# Patient Record
Sex: Female | Born: 1970
Health system: Southern US, Community
[De-identification: ages and names within clinical notes are randomized; demographics above are authoritative.]

## PROBLEM LIST (undated history)

## (undated) DIAGNOSIS — F1021 Alcohol dependence, in remission: Secondary | ICD-10-CM

## (undated) DIAGNOSIS — F411 Generalized anxiety disorder: Secondary | ICD-10-CM

## (undated) DIAGNOSIS — N200 Calculus of kidney: Secondary | ICD-10-CM

## (undated) DIAGNOSIS — I10 Essential (primary) hypertension: Secondary | ICD-10-CM

## (undated) DIAGNOSIS — G4733 Obstructive sleep apnea (adult) (pediatric): Secondary | ICD-10-CM

## (undated) DIAGNOSIS — K5090 Crohn's disease, unspecified, without complications: Secondary | ICD-10-CM

## (undated) DIAGNOSIS — F1020 Alcohol dependence, uncomplicated: Secondary | ICD-10-CM

## (undated) DIAGNOSIS — F1191 Opioid use, unspecified, in remission: Secondary | ICD-10-CM

## (undated) DIAGNOSIS — F329 Major depressive disorder, single episode, unspecified: Secondary | ICD-10-CM

## (undated) DIAGNOSIS — F4323 Adjustment disorder with mixed anxiety and depressed mood: Secondary | ICD-10-CM

## (undated) HISTORY — DX: Crohn's disease, unspecified, without complications: K50.90

## (undated) HISTORY — PX: LITHOTRIPSY: SUR834

## (undated) HISTORY — DX: Alcohol dependence, in remission: F10.21

## (undated) HISTORY — DX: Generalized anxiety disorder: F41.1

## (undated) HISTORY — PX: LEEP: SHX91

## (undated) HISTORY — DX: Adjustment disorder with mixed anxiety and depressed mood: F43.23

## (undated) HISTORY — DX: Opioid use, unspecified, in remission: F11.91

## (undated) HISTORY — DX: Obstructive sleep apnea (adult) (pediatric): G47.33

## (undated) HISTORY — PX: TUBAL LIGATION: SHX77

## (undated) HISTORY — DX: Major depressive disorder, single episode, unspecified: F32.9

---

## 1998-02-26 ENCOUNTER — Emergency Department (HOSPITAL_COMMUNITY): Admission: EM | Admit: 1998-02-26 | Discharge: 1998-02-26 | Payer: Self-pay | Admitting: Emergency Medicine

## 1998-07-19 ENCOUNTER — Emergency Department (HOSPITAL_COMMUNITY): Admission: EM | Admit: 1998-07-19 | Discharge: 1998-07-19 | Payer: Self-pay | Admitting: Emergency Medicine

## 1999-06-11 ENCOUNTER — Emergency Department (HOSPITAL_COMMUNITY): Admission: EM | Admit: 1999-06-11 | Discharge: 1999-06-11 | Payer: Self-pay

## 1999-10-27 ENCOUNTER — Emergency Department (HOSPITAL_COMMUNITY): Admission: EM | Admit: 1999-10-27 | Discharge: 1999-10-28 | Payer: Self-pay | Admitting: Emergency Medicine

## 1999-10-28 ENCOUNTER — Encounter: Payer: Self-pay | Admitting: Emergency Medicine

## 2000-02-27 ENCOUNTER — Encounter: Payer: Self-pay | Admitting: Emergency Medicine

## 2000-02-27 ENCOUNTER — Emergency Department (HOSPITAL_COMMUNITY): Admission: EM | Admit: 2000-02-27 | Discharge: 2000-02-27 | Payer: Self-pay | Admitting: Emergency Medicine

## 2000-04-04 ENCOUNTER — Encounter (INDEPENDENT_AMBULATORY_CARE_PROVIDER_SITE_OTHER): Payer: Self-pay | Admitting: Specialist

## 2000-04-04 ENCOUNTER — Other Ambulatory Visit: Admission: RE | Admit: 2000-04-04 | Discharge: 2000-04-04 | Payer: Self-pay | Admitting: Obstetrics and Gynecology

## 2000-10-19 ENCOUNTER — Ambulatory Visit (HOSPITAL_COMMUNITY): Admission: RE | Admit: 2000-10-19 | Discharge: 2000-10-19 | Payer: Self-pay | Admitting: Obstetrics and Gynecology

## 2003-07-04 ENCOUNTER — Emergency Department (HOSPITAL_COMMUNITY): Admission: EM | Admit: 2003-07-04 | Discharge: 2003-07-04 | Payer: Self-pay | Admitting: Emergency Medicine

## 2003-07-06 ENCOUNTER — Emergency Department (HOSPITAL_COMMUNITY): Admission: EM | Admit: 2003-07-06 | Discharge: 2003-07-06 | Payer: Self-pay | Admitting: Emergency Medicine

## 2004-02-23 ENCOUNTER — Emergency Department (HOSPITAL_COMMUNITY): Admission: EM | Admit: 2004-02-23 | Discharge: 2004-02-23 | Payer: Self-pay | Admitting: Emergency Medicine

## 2004-02-23 ENCOUNTER — Inpatient Hospital Stay (HOSPITAL_COMMUNITY): Admission: AD | Admit: 2004-02-23 | Discharge: 2004-02-26 | Payer: Self-pay | Admitting: Psychiatry

## 2004-02-27 ENCOUNTER — Other Ambulatory Visit (HOSPITAL_COMMUNITY): Admission: RE | Admit: 2004-02-27 | Discharge: 2004-05-27 | Payer: Self-pay | Admitting: Psychiatry

## 2004-03-19 ENCOUNTER — Encounter: Admission: RE | Admit: 2004-03-19 | Discharge: 2004-03-19 | Payer: Self-pay | Admitting: Psychiatry

## 2004-07-11 ENCOUNTER — Emergency Department (HOSPITAL_COMMUNITY): Admission: EM | Admit: 2004-07-11 | Discharge: 2004-07-11 | Payer: Self-pay | Admitting: *Deleted

## 2005-06-06 ENCOUNTER — Emergency Department (HOSPITAL_COMMUNITY): Admission: EM | Admit: 2005-06-06 | Discharge: 2005-06-07 | Payer: Self-pay | Admitting: Emergency Medicine

## 2005-09-11 ENCOUNTER — Emergency Department (HOSPITAL_COMMUNITY): Admission: EM | Admit: 2005-09-11 | Discharge: 2005-09-12 | Payer: Self-pay | Admitting: Emergency Medicine

## 2006-06-21 ENCOUNTER — Ambulatory Visit (HOSPITAL_COMMUNITY): Admission: RE | Admit: 2006-06-21 | Discharge: 2006-06-21 | Payer: Self-pay | Admitting: Neurosurgery

## 2006-11-06 ENCOUNTER — Inpatient Hospital Stay (HOSPITAL_COMMUNITY): Admission: EM | Admit: 2006-11-06 | Discharge: 2006-11-08 | Payer: Self-pay | Admitting: Emergency Medicine

## 2006-11-07 ENCOUNTER — Encounter (INDEPENDENT_AMBULATORY_CARE_PROVIDER_SITE_OTHER): Payer: Self-pay | Admitting: Cardiology

## 2006-11-29 ENCOUNTER — Ambulatory Visit (HOSPITAL_COMMUNITY): Admission: RE | Admit: 2006-11-29 | Discharge: 2006-11-29 | Payer: Self-pay | Admitting: Urology

## 2006-12-12 ENCOUNTER — Ambulatory Visit (HOSPITAL_COMMUNITY): Admission: RE | Admit: 2006-12-12 | Discharge: 2006-12-12 | Payer: Self-pay | Admitting: Urology

## 2007-01-29 ENCOUNTER — Ambulatory Visit (HOSPITAL_COMMUNITY): Admission: RE | Admit: 2007-01-29 | Discharge: 2007-01-29 | Payer: Self-pay | Admitting: Urology

## 2007-08-19 ENCOUNTER — Emergency Department (HOSPITAL_COMMUNITY): Admission: EM | Admit: 2007-08-19 | Discharge: 2007-08-19 | Payer: Self-pay | Admitting: Emergency Medicine

## 2007-08-20 ENCOUNTER — Emergency Department (HOSPITAL_COMMUNITY): Admission: EM | Admit: 2007-08-20 | Discharge: 2007-08-20 | Payer: Self-pay | Admitting: Emergency Medicine

## 2007-10-09 ENCOUNTER — Inpatient Hospital Stay (HOSPITAL_COMMUNITY): Admission: EM | Admit: 2007-10-09 | Discharge: 2007-10-14 | Payer: Self-pay | Admitting: Emergency Medicine

## 2007-10-09 ENCOUNTER — Ambulatory Visit: Payer: Self-pay | Admitting: Internal Medicine

## 2007-10-16 ENCOUNTER — Ambulatory Visit: Payer: Self-pay | Admitting: Family Medicine

## 2007-10-16 ENCOUNTER — Inpatient Hospital Stay (HOSPITAL_COMMUNITY): Admission: EM | Admit: 2007-10-16 | Discharge: 2007-10-19 | Payer: Self-pay | Admitting: Emergency Medicine

## 2007-10-24 ENCOUNTER — Ambulatory Visit: Payer: Self-pay | Admitting: Psychiatry

## 2007-11-13 ENCOUNTER — Other Ambulatory Visit: Payer: Self-pay | Admitting: Emergency Medicine

## 2007-11-14 ENCOUNTER — Inpatient Hospital Stay (HOSPITAL_COMMUNITY): Admission: EM | Admit: 2007-11-14 | Discharge: 2007-11-16 | Payer: Self-pay | Admitting: Emergency Medicine

## 2007-11-15 ENCOUNTER — Ambulatory Visit: Payer: Self-pay | Admitting: Psychiatry

## 2007-11-26 ENCOUNTER — Inpatient Hospital Stay (HOSPITAL_COMMUNITY): Admission: EM | Admit: 2007-11-26 | Discharge: 2007-11-30 | Payer: Self-pay | Admitting: Emergency Medicine

## 2008-01-28 ENCOUNTER — Emergency Department (HOSPITAL_COMMUNITY): Admission: EM | Admit: 2008-01-28 | Discharge: 2008-01-28 | Payer: Self-pay | Admitting: Emergency Medicine

## 2008-03-13 ENCOUNTER — Emergency Department (HOSPITAL_COMMUNITY): Admission: EM | Admit: 2008-03-13 | Discharge: 2008-03-13 | Payer: Self-pay | Admitting: Emergency Medicine

## 2008-04-16 ENCOUNTER — Emergency Department (HOSPITAL_COMMUNITY): Admission: EM | Admit: 2008-04-16 | Discharge: 2008-04-17 | Payer: Self-pay | Admitting: Emergency Medicine

## 2008-05-15 ENCOUNTER — Emergency Department (HOSPITAL_BASED_OUTPATIENT_CLINIC_OR_DEPARTMENT_OTHER): Admission: EM | Admit: 2008-05-15 | Discharge: 2008-05-15 | Payer: Self-pay | Admitting: Emergency Medicine

## 2009-02-07 ENCOUNTER — Emergency Department (HOSPITAL_COMMUNITY): Admission: EM | Admit: 2009-02-07 | Discharge: 2009-02-08 | Payer: Self-pay | Admitting: Emergency Medicine

## 2009-04-24 ENCOUNTER — Emergency Department (HOSPITAL_COMMUNITY): Admission: EM | Admit: 2009-04-24 | Discharge: 2009-04-24 | Payer: Self-pay | Admitting: Emergency Medicine

## 2009-10-06 ENCOUNTER — Emergency Department (HOSPITAL_COMMUNITY): Admission: EM | Admit: 2009-10-06 | Discharge: 2009-10-06 | Payer: Self-pay | Admitting: Emergency Medicine

## 2010-09-11 ENCOUNTER — Encounter: Payer: Self-pay | Admitting: Neurosurgery

## 2010-11-12 LAB — RAPID URINE DRUG SCREEN, HOSP PERFORMED
Cocaine: POSITIVE — AB
Opiates: NOT DETECTED

## 2010-11-12 LAB — POCT I-STAT, CHEM 8
Chloride: 106 mEq/L (ref 96–112)
Creatinine, Ser: 1 mg/dL (ref 0.4–1.2)
Glucose, Bld: 87 mg/dL (ref 70–99)
Potassium: 3.9 mEq/L (ref 3.5–5.1)

## 2010-11-12 LAB — POCT CARDIAC MARKERS
CKMB, poc: <1 ng/mL — ABNORMAL LOW (ref 1.0–8.0)
Troponin i, poc: <0.05 ng/mL (ref 0.00–0.09)

## 2010-11-15 ENCOUNTER — Inpatient Hospital Stay (HOSPITAL_COMMUNITY)
Admission: RE | Admit: 2010-11-15 | Discharge: 2010-11-15 | Disposition: A | Discharge status: Home / Self Care | Payer: Self-pay | Source: Ambulatory Visit | Attending: Family Medicine | Admitting: Family Medicine

## 2010-11-26 LAB — URINE CULTURE: Colony Count: >=100000

## 2010-11-26 LAB — URINALYSIS, ROUTINE W REFLEX MICROSCOPIC
Bilirubin Urine: NEGATIVE
Ketones, ur: NEGATIVE mg/dL
Nitrite: POSITIVE — AB
pH: 5.5 (ref 5.0–8.0)

## 2010-11-26 LAB — URINE MICROSCOPIC-ADD ON

## 2011-01-04 NOTE — H&P (Signed)
NAME:  [name], [name] NO.:  [id number]   MEDICAL RECORD NO.:  [id number]          PATIENT TYPE:  INP   LOCATION:  8144                         FACILITY:  [facility]   PHYSICIAN:  Aquilla Hacker, M.D. DATE OF BIRTH:  07/14/1971   DATE OF ADMISSION:  11/14/2007  DATE OF DISCHARGE:                              HISTORY & PHYSICAL   PATIENT'S PRIMARY CARE DOCTOR:  Unassigned.   CHIEF COMPLAINT:  Elevated blood pressure.   HISTORY OF PRESENT ILLNESS:  Ms. Finigan is a 40 year old female with a  past medical history of heroin addiction.  She has presented to the  hospital in the past seeking treatment for her addiction with the last  documentation of her presentation being February 2009.  Per prior  discharge summary, it appears that during that hospitalization the  patient had a friend to visit her, and the friend was discovered to have  brought heroin to the hospital to give the patient.  There was also  question as to whether or not the patient was actually injecting heroin  through her IV line while she was in the hospital during that time  frame.  It appears that during that hospitalization which was only one  month ago, [facility] police department was called on the patient's  friend, and the patient was subsequently discharged from the hospital.  The patient now presents initially to [facility] seeking  help with recovery from the use of heroin addiction.  During her  presentation to [facility], it is reported by the emergency department  physician that the patient's blood pressure was discovered to be greater  than 818 systolically.  Because of the elevated blood pressure, the  patient was referred to [facility] emergency department for further  evaluation.  Currently the patient is sedated secondary to multiple  dosages of Ativan being given to her, and she cannot provide a history  herself.   PAST MEDICAL HISTORY:  1. Heroin addiction.  2. The patient  has had elevated blood pressure in the past.  It is      questionable as to whether or not the patient does have essential      hypertension versus a reaction to heroin withdrawal and ongoing      cocaine abuse.  3. History of right ureteral stone.  4. Hydronephrosis.  5. Normocytic anemia,  6. Migraine headaches.  7. Depression.  8. Crohn disease.  9. COPD.   ALLERGIES:  PENICILLIN, GLUCOCORTICOIDS   SOCIAL HISTORY:  The patient currently is too sedated to give a history.  However, per prior hospital records, the patient has an ongoing history  of heroin addiction as well as cocaine addiction.   FAMILY HISTORY:  The patient currently cannot provide.   REVIEW OF SYSTEMS:  The patient cannot provide secondary to being  sedated.   PHYSICAL EXAMINATION:  GENERAL:  The patient is lethargic-appearing.  She also appears to be uncomfortable at times, occasionally rocking back  and forth.  She does not respond to any questions, although she does  open her eyes to tactile stimuli.  I spoke to  Dr. Vanessa Kick who is one  of the ER physician, and he states that the patient has been given  multiple injections of Ativan since being in the hospital emergency  department, and this is the reason for sedation.  VITALS:  Temperature is 98.7, blood pressure of 205/110, heart rate is  62, respirations 32, O2 sat is 100%.  HEENT: Normocephalic, atraumatic, anicteric.  Pupils are sluggish to  respond to light.  Oral mucosa is pink.  No thrush, no exudates.  NECK:  No lymphadenopathy, no thyromegaly.  CARDIAC:  Heart sounds are slightly distant but S1-S2 can be  appreciated.  RESPIRATORY:  No crackles or wheezes are auscultated.  ABDOMEN:  Flat, soft, nontender, nondistended.  No masses palpated.  Bowel sounds are somewhat sluggish, although they are present in all  four quadrants.  EXTREMITIES:  No leg edema appreciated.  NEUROLOGICAL:  The patient currently does not respond to any type of   questions and it is very difficult to get her to cooperate with regards  to her neurologic examination.  MUSCULOSKELETAL:  The patient currently is not cooperating secondary to  being given Ativan.   LABORATORY DATA:  Urine drug screen is positive for cocaine and also  positive for opiates.  Sodium is 138, potassium is 3.2 chloride is 106,  CO2 21, glucose 109, BUN 6, creatinine 0.64, calcium is 8.8.  Alcohol  level is less than five white blood cell count is 8.9, hemoglobin and  14.1, hematocrit 40.9, platelet count is 262.   ASSESSMENT/PLAN:  1. Hypertension.  This is possibly a response to the presence of      cocaine and/or heroin withdrawal in this patient.  In light of the      patient currently also experiencing intractable nausea and      vomiting, will start the patient off with a clonidine patch.  Will      provide the patient with p.r.n. IV antihypertensive medications for      now.  2. Heroin withdrawals.  Will treat the patient symptomatically.  Will      consult psychiatry for help with transfer of the patient to [facility]      once the blood pressure is normalized  3. Intractable nausea, vomiting.  Again, this is most likely secondary      to heroin withdrawal.  Will provide the patient with p.r.n.      antiemetics for now.  4. Hypokalemia.  Will provide potassium supplementation.  5. DVT prophylaxis.  Will provide Lovenox.  6. GI prophylaxis.  Will provide Protonix.      Aquilla Hacker, M.D.  Electronically Signed     OR/MEDQ  D:  11/14/2007  T:  11/14/2007  Job:  [id number]

## 2011-01-04 NOTE — H&P (Signed)
[name], [name]               ACCOUNT NO.:  [id number]   MEDICAL RECORD NO.:  [id number]          PATIENT TYPE:  INP   LOCATION:  [place]                         FACILITY:  [facility]   PHYSICIAN:  Kristen Faster, MD        DATE OF BIRTH:  1971/01/29   DATE OF ADMISSION:  11/26/2007  DATE OF DISCHARGE:                              HISTORY & PHYSICAL   PRIMARY CARE PHYSICIAN:  This patient is unassigned to [place].   CHIEF COMPLAINTS:  Chest pain, back pain and nausea.   HISTORY OF PRESENT ILLNESS:  [name] is a 40 year old lady with a  history of cocaine and heroin abuse who comes in with chest pain, back  pain and nausea.  She was recently admitted to the hospital from November 14, 2007 to November 16, 2007 with elevated blood pressure.  At that time,  we consulted psychiatry while she was in the hospital.  I do not have  the final discharge summary from that hospital stay. She states she was  sent home with medications. She was not taking the medications as she  was told and she continued to use cocaine and heroin. She states she  last used cocaine and heroin yesterday afternoon and yesterday night she  started having chest pain, back pain and nausea.  The chest pain was  central,  sharp, it was nonradiating, not associated with shortness of  breath and she states the pain has almost resolved at this time. She  also complains of back pain and generalized body pains and she also  complains of nausea. When she came to the hospital, her blood pressure  was elevated at 200/110.   PAST MEDICAL HISTORY:  1. History of cocaine and heroin abuse.  2. Hypertension.  3. Depression.  4. COPD.  5. History of Crohn's disease.   ALLERGIES:  She is allergic to PENICILLIN abdomen GLUCOCORTICOIDS.   CURRENT MEDICATIONS:  None.   SOCIAL HISTORY:  She has a history of heroin and cocaine use.  She  states she smokes one to one and a half packs of cigarettes a day.   FAMILY HISTORY:  Noncontributory.   REVIEW OF SYSTEMS:  GENERAL:  She denies any recent weight loss, weight  gain.  No fever or chills.  HEENT:  No headaches, no blurred vision.  No  sore throat.  CARDIOVASCULAR:  Denies chest pain or palpitations.  RESPIRATORY:  No shortness of breath, cough . ABDOMEN:  No abdominal  pain, nausea. GI:  No diarrhea or  constipation.   PHYSICAL EXAMINATION:  She is alert and oriented x3.  VITAL SIGNS:  Temperature is 97.8, pulse rate of 56, blood pressure is  200/110 which has come down to 120/74, respiratory rate is 18 per  minute.  Oxygen saturation is 100%on room air.  HEENT:  Head atraumatic, normocephalic.  Pupils equal, round, and  reactive to light. Mucous membranes are moist.  NECK:  Supple.  No JVD or carotid bruits.  CARDIOVASCULAR:  S1, S2 heard. Regular rate and rhythm.  CHEST:  Clear to auscultation.  ABDOMEN: Soft.  Bowel sounds heard.  EXTREMITIES:  No edema, cyanosis or clubbing.   LABORATORY DATA:  Labs show a white count of 8000, hemoglobin 14.2,  platelets 381.  Sodium 136, potassium 3.2, BUN 10, creatinine 0.6.  Urine HCG is negative.  Troponin is less than 0.05, CK-MB is less than  one.  EKG shows no ST changes.  There is T inversion in V1-V2.  Urinalysis negative.  Alcohol level is less than 5. Urine drug screen is  positive for cocaine and benzodiazepine.   IMPRESSION:  1. Hypertensive urgency.  2. Heroin and cocaine abuse.  3. Chest pain likely secondary to cocaine use.  4. Depression.  5. Tobacco abuse.  6. Hypokalemia.   PLAN:  This is a 40 year old lady who was recently discharged from the  hospital who comes back with an elevated blood pressure. Her blood  pressure is likely elevated secondary to cocaine and this could also be  secondary to heroin withdrawal.  Her blood pressure has come down in the  ER with the use of Clonidine. I will continue the Clonidine as before.  She likely needs to be detoxed from heroin and cocaine and she may need  inpatient  detox. Will ask Social Services consult for help with an  inpatient detox. I will continue her with clonidine. I will try to avoid  benzodiazepines and opiates on her at this time.  I will use Tylenol for  pain on her. I will replace her potassium and I will also cycle cardiac  markers. Her chest pain is likely secondary to her cocaine use. Her EKG  did not show any acute changes.  I will also put her on DVT prophylaxis.      Kristen Faster, MD  Electronically Signed     PKN/MEDQ  D:  11/26/2007  T:  11/27/2007  Job:  [id number]

## 2011-01-04 NOTE — Consult Note (Signed)
NAME:  [name], [name] NO.:  [id number]   MEDICAL RECORD NO.:  [id number]          PATIENT TYPE:  INP   LOCATION:  4712                         FACILITY:  [facility]   PHYSICIAN:  Norm Salt, MD  DATE OF BIRTH:  [birth year]   DATE OF CONSULTATION:  11/15/2007  DATE OF DISCHARGE:                                 CONSULTATION   IDENTIFYING DATA AND REASON FOR REFERRAL:  This is my second contact  with [name], a 40 year old Caucasian female currently under the care of  the [facility] team here at [facility].  She is admitted with  heroin dependence, cocaine abuse, and detoxification for same.  Psychiatric consultation is requested to assess mental status and make  recommendations.   HISTORY OF THE PRESENTING PROBLEMS:  The patient has a long history of  opiate dependence.  She abuses heroin in large quantities.  She has had  numerous admissions to the hospital, including two in the month of  February 2009.  In the first of these, she left against medical advice.  In the second of these, the undersigned provided consultation  psychiatrically.  At that time, the patient acknowledged heroin  addiction but indicated her opinion that due to her chronic and severe  pain, she would always need some form of narcotic analgesia to be able  to live comfortably.  She was not interested in exploring a  comprehensive program of non-narcotic measures to address her pain and  to get into long-term sobriety.  Ultimately, later on during that same  stay, a visitor brought her certain quantities a white powder.  The  visitor was arrested for drug possession.  The patient was subsequently  discharged.   Today, the patient states that she was trying to get into the [facility]  rehab program.  She indicates that she had decided that she did after  all need to get completely off of all opiates and narcotic analgesics.  She states that she had a bed reserved at [facility] but  they wanted her  medically cleared and detoxed prior to admission there.  She states that  she was medically cleared but in the meantime they gave her bed away at  [facility].   The patient is now receiving medical detoxification.  Her UDS was  positive for both cocaine and opiates.  She is being detoxified with  intravenous fluids and, in addition, clonidine and p.r.n. Ativan.   MENTAL STATUS OBSERVATIONS:  The patient is a cachectic, short-statured  woman who looks considerably older than her chronological age.  She is  awake, alert, and fully oriented.  There is a Technical brewer nearby  providing one-to-one observation.  The patient remembers me from our  previous contact a month ago.  She tells me that she feels terrible.  She describes nausea, vomiting and diarrhea, and inability to eat.  She  states that this is because the medication that she is being given his  not strong enough.  She discusses how she got here and the fact that  she wants to go through detoxification and then go through  the rehab  program at [facility].  However, she keeps coming back to the subject of  getting stronger medication to help her get through her detoxification.   Her thoughts and speech are normally-organized.  There is nothing to  suggest any underlying thought disorder, psychosis, cognitive or memory  impairment.  Her mood is dysphoric with a grim, intense affect.   IMPRESSION:  The patient is a 40 year old of heroin-dependent individual  who is currently in opiate withdrawal in relation to her chronic opiate  dependence.  She indicates that she is open to complete detoxification  and long-term recovery and abstinence and sobriety.   DIAGNOSTIC IMPRESSION:  AXIS I:  1.  Opiate dependence, chronic, severe.  2.  Opiate withdrawal.  3.  Cocaine abuse.  AXIS II:  Deferred.  AXIS III:  See medical history.  AXIS IV:  Stressors severe.  AXIS V:  GAF 45.   RECOMMENDATIONS:  It is recommended  that the patient's current  management continue as is.  Please note that this patient is highly  likely to make repeated requests for more and stronger medication,  particularly benzodiazepine tranquilizers and possibly pain medications  as well.  Although opiate detoxification can be unpleasant, is not  something that is so uncomfortable that she should not be able to  withstand it without benzodiazepines.  I would recommend discontinuing  p.r.n. Ativan as it is not appropriate for opiate detoxification, but  only appropriate for alcohol, tranquilizer, benzodiazepine and certain  other sedative withdrawals.  I would maximize her use of clonidine and  consider medications such as p.r.n., Bentyl for GI disturbance and other  medications for symptomatic relief of withdrawal symptoms.   She does not have any obvious concurrent psychiatric disorder.  Although  her mood is quite dysphoric, this is appropriate to her current  situation and level of discomfort from withdrawal.   I would recommend that social services work with the patient towards  getting to the [facility] program for further rehabilitative treatment  following her medical stabilization and detoxification.   Thank you for involving me in this patient's care.      Norm Salt, MD  Electronically Signed     SPB/MEDQ  D:  11/15/2007  T:  11/15/2007  Job:  [id number]

## 2011-01-04 NOTE — H&P (Signed)
NAME:  [name], [name]               ACCOUNT NO.:  [id number]   MEDICAL RECORD NO.:  [id number]          PATIENT TYPE:  INP   LOCATION:  4704                         FACILITY:  [place]   PHYSICIAN:  Domingo Cocking. Jimmye Norman, M.D.DATE OF BIRTH:  [birth year]   DATE OF ADMISSION:  10/09/2007  DATE OF DISCHARGE:                              HISTORY & PHYSICAL   CHIEF COMPLAINT:  Nausea, vomiting, diarrhea x1 week.   HISTORY OF PRESENT ILLNESS:  The patient was in her usual state of  health 1 week ago when she reports onset of the above.  The onset was  gradual, without blood or bile noted in the fluids.  This complaint was  stable and moderately intense (4/10) until today when the intensity  increased to 8/10.  She notes specifically that the vomiting and  diarrhea became uncontrollable with events every few hours as compared  to 2 or 3 times a day previously.  She notes continued p.o. intake of  liquids and solids but explains that she does not have good intake in  general, but that she has not been staying well hydrated.  In addition  to this complaint, she notes comorbid heroin and cocaine use.  She says  she uses 6 bags of heroin a day and crack cocaine every few weeks.  She  used crack yesterday.  She has had no interruption in her heroin regimen  recently.  In addition to these complaints, she notes following emesis  this morning an episode of substernal chest pain.  The pain had it is  onset with her emesis.  It was nonradiating.  It was relieved by  cessation of retching.  The event was singular in nature and did not  recur.  She denies sick contacts.  She denies fever.   PAST MEDICAL HISTORY:  1. Cocaine abuse.  2. Heroin abuse.  3. Hypertension with hypertensive crisis.  4. Right ureteral stone  5. Hydronephrosis, status post shunt.  6. Hypokalemia.  7. Tobacco abuse.  8. Normocytic anemia.  9. Migraine headaches.  10.Depression.  11.Crohn disease.  12.COPD.   PAST SURGICAL  HISTORY:  1. Loop electrocautery excision procedure on her cervix, remote.  2. Lithotripsy   FAMILY HISTORY:  Hypertension and hypothyroidism.   SOCIAL HISTORY:  The patient is divorced.  She has 2 children who live  with their father.  She smokes 1 to 1-1/2 packs per day of tobacco x20  years plus.  She denies recent alcohol use.  As noted previously, her  heroin habit is 6 bags daily with intermittent crack cocaine inhalation.   ALLERGIES:  No known drug allergies.   MEDICATIONS:  None.   REVIEW OF SYSTEMS:  Positive for nausea, vomiting, diarrhea, abdominal  pain, chest pain x1, chronic shortness of breath, visual hallucinations  (objects and periphery).  She denies visual loss, convulsions,  hematuria, mental status changes.  She denies hematemesis and  hematochezia.   PHYSICAL EXAM:  VITAL SIGNS:  98.6 degrees Fahrenheit, heart rate 63,  blood pressure 188/114, respirations 18, O2 sat 98% on room air.  She is  awake, alert,  lying on the ER bed with the covers pulled up over her  head.  Normocephalic, atraumatic.  On her lower left lip she has with  appears to be either herpetic confluent or possibly abraded excoriated  lesions, on the right excoriated lesion.  The lesion is nonbloody  nonexudative.  On exam extraocular muscles intact.  Pupils 5 mm and  reactive.  NECK:  No JVD.  HEART:  Sinus rhythm, no murmur.  PMI nondisplaced.  LUNGS:  Clear without dullness.  ABDOMEN:  Diffusely tender to palpation with increased bowel sounds and  no masses.  She has tracking on her skin from heroin injections.  EXTREMITIES:  Warm and well-perfused.  Upper extremity and lower  extremity strength is decreased, 3/5, with sensation intact.  She has  good grip strength but cannot raise her arms and legs to active  resistance very easily.  She is oriented to person and place.   WBC 10.7, hemoglobin 14.3, platelets 439.  Sodium 138, potassium 2.7,  chloride 104, CO2 23, BUN 5, creatinine  0.8, glucose 114, AST 16, ALT  21, total protein 8.1, bilirubin 0.9, albumin 3.6.  UA with moderate LE,  too numerous to count bacteria, WBCs.  UTOX is opiate and cocaine  positive.  Chest x-ray:  No acute process.   ASSESSMENT:  The patient is a 40 year old with hypertensive urgency,  nausea, vomiting and diarrhea.   1. Hypertension:  The patient responded poorly to Ativan in the ER.  I      have written hydralazine 10 mg IV or p.o. with blood pressure      195/110 or higher.  Will give Ativan as needed for agitation.  No      neurologic focality, chest findings, hematuria or renal failure.      She has no signs of hypertensive emergency.  2. Hypokalemia:  IVF with potassium repletion.  Will follow serial      potassium.  This is likely secondary to GI losses.  3. Chest pain:  EKG with LVH, increased QT, stable since 10/2006.  As      compared to prior EKG it looks like she has had some advancement of      the LVH.  Will cycle enzymes and avoid beta blockers.  She has had      no chest pain complaint since the initial episode this morning and      I do not think that this is cardiac.  4. Diarrhea:  No reports of blood or fever.  Possibly infectious but      with low probability.  Crohn's is more likely, and supported by her      history.  She has no report of decreased heroin use, so heroin      related withdrawal and gastric reactivity is unlikely.  Will      discuss corticosteroid bursts following stool studies and clinical      course.  Will attempt to track down her gastroenterologist to see      if we can get an inpatient consult and get her reestablished with      care.  She was previously on antispasmodics and other regimen but      she stopped taking them secondary to financial constraints.  5. Heroin abuse:  Estimating her daily use at 210 mg.  This is based      on the assumption that a bag of heroin is 100 mg and average purity      is 35%.  Depending upon the purity of the  heroin she is using, this      could be grossly off, but it is the best we can do.  She will need      approximately 70 mg p.o. methadone daily per NHS guidelines,      however FDA guidelines limit initial therapy to 40 mg a day,      requiring documentation of withdrawal symptoms before up-titration      is recommended.  Therefore we will put her on 20 mg b.i.d.      Discussed this with pharmacy on 2 occasions to confirm my count and      the formulas I have used.  If pharmacy is in agreement, then we      will follow along with an expected daily methadone dose of between      70 and 90 mg.  6. Disposition:  The patient clearly has substance abuse issues which      have devastated her life.  After stabilizing      her hypertensive crisis, hypokalemia and diarrhea, she will need to      get plugged in with social work and psych.  It is unclear at this      time whether or not she will be willing to undergo substance abuse      therapy.  Once she is stable, we will readdress this with her and      provide her any resources she is willing to take advantage of.      Sarita Bottom, M.D.  Electronically Signed      Domingo Cocking. Jimmye Norman, M.D.  Electronically Signed    JP/MEDQ  D:  10/09/2007  T:  10/10/2007  Job:  [id number]

## 2011-01-04 NOTE — Discharge Summary (Signed)
[name], [name] NO.:  [id number]   MEDICAL RECORD NO.:  [id number]          PATIENT TYPE:  INP   LOCATION:  5449                         FACILITY:  [facility]   PHYSICIAN:  Rise Patience, MDDATE OF BIRTH:  11/26/1970   DATE OF ADMISSION:  11/26/2007  DATE OF DISCHARGE:                               DISCHARGE SUMMARY   COURSE IN THE HOSPITAL:  A 40 year old female with known history of  polysubstance abuse associated chest pain and nausea.  On admission,  patient had a urine drug screen which is positive of cocaine and  benzodiazepine.  Patient admitted to telemetry floor.  Cardiac enzymes  WERE within acceptable limits.  Patient was initially placed on Ativan  p.r.n. along with clonidine for blood pressure control.  Subsequently,  clonidine was tapered off and patient was started on Norvasc along with  Percocet for stopping any withdrawals.  Patient was strongly counseled  not to abuse any drugs or smoke cigarettes.  This time, patient is being  referred to [facility] for which she has an appointment on December 03, 2007.  Also has an appointment scheduled for [facility] on  December 12, 2007.  Patient is motivated and willing to follow with these  places.  Patient was discharged on p.r.n. Percocet to avoid any  withdrawals along with Norvasc 2.5 for blood pressure control.  At time  of discharge, patient is hemodynamically stable.   ASSESSMENT:  1. Accelerated hypertension secondary to drug withdrawal.  2. Atypical chest pain, resolved.  3. Cigarette smoking.  4. Polysubstance abuse.   MEDICATIONS ON DISCHARGE:  1. Percocet 5/325 mg p.o. q.6 p.r.n.  2. Norvasc 2.5 mg p.o. daily.   PLAN:  Patient is to follow with a primary care physician within a  week's time, to follow with [facility] on December 03, 2007, and further  management for [facility] for her polysubstance abuse.  Patient also has a  referral to [facility] to follow with [facility] as scheduled on April 22nd.  Patient is to have a  cardiac healthy diet.  Patient strongly advised to quit smoking and not  to abuse any drugs.      Rise Patience, MD  Electronically Signed     ANK/MEDQ  D:  11/30/2007  T:  11/30/2007  Job:  [phone]

## 2011-01-04 NOTE — Consult Note (Signed)
NAME:  [name], [name] NO.:  [id number]   MEDICAL RECORD NO.:  [id number]          PATIENT TYPE:  INP   LOCATION:  3039                         FACILITY:  [facility]   PHYSICIAN:  Norm Salt, MD  DATE OF BIRTH:  03-12-1971   DATE OF CONSULTATION:  10/17/2007  DATE OF DISCHARGE:                                 CONSULTATION   IDENTIFYING DATA AND REASON FOR REFERRAL:  The patient is a 40 year old  divorced mother of 2 who is at [facility], under the care of  Dr. Oneal Grout.  The patient was referred because of heroin dependence  and perceived need for detoxification.  The consultation request  indicated that the patient wants to get clean, wants treatment.   HISTORY OF THE PRESENTING PROBLEMS:  I met with the patient in her  hospital room, and also spoke briefly with Dr. Oneal Grout before  interviewing the patient, and then following my interview with the  patient.   The patient indicates that she has been divorced for the last 55 years,  and has a 22 year old son and 46 year old daughter at home some of the  time, although primarily they live with their father.  She lives in  [place].  She is unemployed, and apparently used to work in [employer]  but has not had a job for several years.  She states that she has  bulging disks in L3, L4 and L5, and rheumatoid arthritis of the coccyx  and hip.  She states that she has chronic pain because of this.  She had  been seeing a pain doctor at [facility],  but states that she was cut off by her doctor since missing some  appointments.  Since that occurred, she states that she has begun using  large quantities of heroin.   She denies any psychiatric history, and denies any independent  psychiatric problem such as depression.   PAST PSYCHIATRIC HISTORY:  As above.   PAST MEDICAL HISTORY:  Please refer to the history and physical.  Apparently, right now the patient is being  treated with clonidine and  Ativan for opiate withdrawal.   MENTAL STATUS AND OBSERVATIONS:  The patient is a slender, ill-appearing  woman who appears older than her chronological age of 67.  She has very  poor dentition.  She is alert, oriented in all spheres, and generally  pleasant but sad.  Despite her description of her lower back problems,  she sits cross-legged on the bed and leans forward, flexing her spine  quite a bit forward as she does so without any apparent discomfort.  She  readily admits to being addicted to heroin, and at the same time being  in the dilemma of not having a pain doctor currently who will continue  to prescribe narcotic analgesics for her.  She states that she is  supposed to have surgery at some point in the future, but is not sure  when this will occur.   She appears to be moderately depressed, consistent with her overall  situation.  There are no signs or symptoms of psychosis  or thought  disorder whatsoever.   I discussed with the patient the possible avenues that she has before  her.  My recommendation to her was that she consider getting completely  off all illegal as well as legal opiate medications.  I indicated to her  that her severe heroin dependence is an indication that she can probably  not take prescription opioids safely and responsibly.  I indicated to  her my opinion that her current heroin addiction is probably a larger  problem in the overall scheme of her life than her current back pain.  She disagreed with this, and indicated to me that she believes she will  always need to be on some form of narcotic analgesic.  She states I  want to get back on my pain medication, or get on methadone.   IMPRESSIONS:  The patient presents with a history of opiate dependence,  which she admits to with respect to her recent heroin problems.  However, she denies any irresponsible or inappropriate use of her  prescription medication.  She states  that she was recently terminated by  her pain management doctor due to missing appointments, but denies any  misuse of medication.  There is some doubt in my mind as to whether this  is the real reason that she was terminated from that practice.  It does  not sound like a reason that a person with legitimate need for pain  control would be discontinued from a practice.  Nonetheless, the patient  has demonstrated a pattern of opiate dependence as characterized by her  current severe heroin addiction, and she is not a candidate for any  potentially habit forming medication of any kind, ever again, regardless  of the chronicity of her pain.  The patient at this time does not appear  to be open to nonnarcotic means of addressing her chronic pain, insists  that she needs narcotic pain medication and/or methadone.   DIAGNOSTIC IMPRESSION:  AXIS I:  Opiate dependence, chronic.  Also,  substance induced mood disorder.  AXIS II:  Deferred.  AXIS III:  Chronic low back pain, reported rheumatoid arthritis.  AXIS IV:  Stressors severe.  AXIS V:  GAF 65.   RECOMMENDATION:  At this time, I do not believe that it makes sense for  this patient to be sent to a situation for the purposes of opiate  detoxification.  She is not interested in coming off of opiates in a  complete and final sense.  She indicates her intention to continue using  prescription opiate analgesics to the extent that she can acquire them  from a physician, or to get involved in a methadone program.   I discussed with Dr. Oneal Grout the patient's requests, and the  patient's stance regarding an approach that would involve complete  sobriety and abstinence from all opiate and narcotic drugs and  medications.  We also discussed the fact that, even if the patient were  completely appropriate for full opiate detoxification at this time, it  would be very difficult to secure this in the short run, and arrange  this in such a way that  the patient could go directly to detox from this  hospital situation.  At present, opiate detoxification alone is not an  adequate indication for inpatient psychiatric hospitalization, as it  does not involve medical risk.   I discussed with Dr. Oneal Grout the need to confer with social work  services regarding referring the patient to various community resources  that may be available for her, including [facility]  [place].   For the time being, it is appropriate to continue the patient on a  clonidine taper to address any continuing opiate withdrawal symptoms  that she may experience.   I appreciate the opportunity to participate in this patient's care.      Norm Salt, MD  Electronically Signed     SPB/MEDQ  D:  10/17/2007  T:  10/17/2007  Job:  [id number]

## 2011-01-04 NOTE — Op Note (Signed)
NAME:  [name], [name]               ACCOUNT NO.:  [id number]   MEDICAL RECORD NO.:  [id number]          PATIENT TYPE:  AMB   LOCATION:  DAY                          FACILITY:  [facility]   PHYSICIAN:  Hanley Ben, M.D.  DATE OF BIRTH:  November 28, 1970   DATE OF PROCEDURE:  01/29/2007  DATE OF DISCHARGE:                               OPERATIVE REPORT   PREOPERATIVE DIAGNOSIS:  Right ureteral stone.   POSTOPERATIVE DIAGNOSIS:  Right ureteral stone.   PROCEDURE DONE:  Cystoscopy, right retrograde pyelogram, ureteroscopy,  with holmium laser of right ureteral calculus, stone extraction, and  insertion of double-J catheter.   INDICATIONS:  The patient is a 40 year old female who was seen in March  2008 for right flank pain.  CT scan of the abdomen and pelvis showed  right hydronephrosis, with malrotated kidney and a 6 mm proximal right  ureteral calculus.  She had a double-J catheter.  She was then scheduled  for stone manipulation in April 2008.  However, the procedure was  cancelled because of hypertension.  She is rescheduled today for the  procedure.   Under general anesthesia, the patient was prepped and draped and placed  in the dorsal lithotomy position.  A #22 cystoscope was inserted in the  bladder.  There is a double-J catheter coming out of the right ureteral  orifice, with encrustations on the distal end of the stent.  There is no  evidence of tumor in the bladder.  The double-J catheter was then  grasped with a grasping forceps and pulled out through the urethra.  A  guidewire could not be passed through the double-J catheter because of  blockage of the double-J catheter.  The double-J catheter was then  removed.  A guidewire was then passed through the cystoscope and into  the right ureteral orifice.  The cystoscope was removed.  The guidewire  was left in the ureter as a safety guidewire.  A #6-French rigid  ureteroscope was then passed in the ureter and advanced up to the  midureter, but at that point the ureteroscope could not be advanced  because of edema of the ureter.  The ureteroscope was removed.  A  ureteroscope access sheath was then passed over the guidewire, and the  distal ureter was dilated with the ureteroscope access sheath.  The  ureteroscope access sheath was removed.  The ureteroscope was then  reinserted in the ureter.  It was still difficult to pass the  ureteroscope through the distal ureter.   Retrograde pyelogram:   Contrast was then injected through the ureteroscope.  There is an area  of narrowing of the ureter at the level of the iliac vessels.  The  proximal ureter appears moderately dilated, and there is no evidence of  extravasation  of contrast.  The ureteroscope was then removed.   The ureteroscope access sheath was then passed over the guidewire, and  the distal ureter was again dilated with the ureteroscope access sheath.  The ureteroscope access sheath was then removed.  The ureteroscope was  then reinserted in the ureter, and at this time I was  able to pass the  area of edema of the ureter, and at that location there is a stone.  The  ureteroscope could be passed all the way up into the renal pelvis  without difficulty, and there is no evidence of other stone in the  ureter.  With the 365 microfiber holmium laser, the stone was fragmented  in multiple minute fragments.  Then, those fragments were extracted with  the nitinol basket.  There is no evidence of remaining fragments in the  ureter.  The ureteroscope was then removed.  The guidewire was then  backloaded into the cystoscope, and a #6-24 double-J catheter was passed  over the guidewire.  The proximal curl of the double-J catheter is in  the renal pelvis.  The distal curl is in the bladder.  The bladder was  then emptied, and the cystoscope and guidewire were removed.   The patient tolerated the procedure well and left the O.R. in  satisfactory condition to the  postanesthesia care unit.     Note that the stent was left with a string.      Hanley Ben, M.D.  Electronically Signed     MN/MEDQ  D:  01/29/2007  T:  01/29/2007  Job:  [id number]   cc:   Barton Fanny, M.D.  Fax: [phone]

## 2011-01-04 NOTE — Discharge Summary (Signed)
NAMEJOYOUS, GLEGHORN NO.:  [id number]   MEDICAL RECORD NO.:  [id number]          PATIENT TYPE:  INP   LOCATION:  4174                         FACILITY:  [facility]   PHYSICIAN:  Blane Ohara McDiarmid, M.D.DATE OF BIRTH:  1971-07-06   DATE OF ADMISSION:  10/16/2007  DATE OF DISCHARGE:  10/19/2007                               DISCHARGE SUMMARY   CONSULTANTS:  Norm Salt, MD from Psychiatry.   PROCEDURES:  None.   REASON FOR ADMISSION:  The patient is a 40 year old female who was  recently admitted to the family practice service for heroin withdrawal  but left against medical advice several days prior to this admission.  She came to the emergency department because she had a severe headache.  During initial exam, it was difficult to ascertain any other reasons  that she presented to the emergency department.   DISCHARGE DIAGNOSES:  Heroin dependence, left radial nerve palsy,  hypertension, chronic pain, and tobacco use.   ADMISSION LABORATORY STUDIES:  Included electrolytes which were within  normal limits with a sodium of 137, potassium 3.5, chloride 103,  bicarbonate 26, BUN 10, creatinine 0.7, and glucose 128.  CBC showed  white blood cell count of 6.3, hemoglobin of 11.9, hematocrit 34.7, and  platelets elevated at 475.  Alcohol level was 9.  UA was negative.  UDS  was positive for cocaine, opiates, and benzodiazepine.  A head CT was  performed which showed no acute findings.   DISCHARGE MEDICATIONS:  The patient was discharged on the following  medicine, clonidine 0.1 mg p.o. b.i.d.   HOSPITAL COURSE:  1. Heroin dependence.  A psychiatric consult was ordered.  In the      meantime, Ms. Barbe was treated with p.r.n. Ativan for her anxiety      and agitation, and clonidine which was both for her hypertension      and to help with her detox.  Social work was also consulted.  This      is the second time that the patient has been in, and we were hoping   to get her set up with some substance abuse treatment.      Unfortunately, on the fourth day of her admission, she was found to      have packets of white powder in her room, a friend was with her,      the police were called.  The friend was arrested for possession of      drugs.  Ms. Deland was not arrested.  We have no definitive proof,      but it certainly appears that Ms. Repetto was using heroin while she      was here, most likely injecting it through her IV.  Because of      this, we felt that it was no longer appropriate nor safe to keep      her here and decided to discharge her.  She has been given      resources from multiple people.  I personally gave her a packet of      information on Adult  Drug Services in [place], a packet of      information on Narcotics Anonymous including a list with phone      numbers and locations for their meetings.  The social worker has      come by and has given her quite a bit of information on other local      substance abuse treatment services that we have, and we have all      encouraged her to seek treatment outside the hospital, but feel      that it is not appropriate to keep her here when she is apparently      using in the hospital.  2. Left arm weakness.  The patient, although apparently did not have      this when she first came to the ED before getting moved to her room      on the floor, began to have left arm weakness that was consistent      with a left radial nerve palsy.  She was able to flex her wrist,      but unable to extend her wrist and unable to spread out her      fingers.  This is probably a nerve palsy secondary to compression      of the radial nerve and will resolve on its own.  The patient was      counseled about this condition and told that it may take weeks to a      month or two for it to resolve completely.  3. Hypertension.  The patient was placed on clonidine 0.1 mg b.i.d.      This was to help with her  blood pressure and to aid in her      withdrawal from heroin.  She was discharged with a prescription for      the clonidine, although I doubt that she will fill it.  4. No PCP.  The last time the patient was here, the patient was given      information about [facility], and the patient reports she still      has this information.  She was strongly encouraged to find a      primary care doctor with [facility].   The patient's condition at the time of discharge is stable.  Pending  test results at the time of discharge, none.   DISPOSITION:  The patient was discharged home with encouragement and  information to help her follow up with substance abuse treatment.   DISCHARGE FOLLOWUP:  The patient is again encouraged to follow up with a  local drug treatment center and/or Narcotics Anonymous.  She was also  encouraged to follow up and establish care at [facility].   FOLLOWUP ISSUES:  As above, substance abuse treatment and primary care  and hypertension to be managed by primary care Ramond Darnell.      Carin Hock, MD  Electronically Signed      Blane Ohara McDiarmid, M.D.  Electronically Signed    SO/MEDQ  D:  10/19/2007  T:  10/19/2007  Job:  [id number]

## 2011-01-04 NOTE — Consult Note (Signed)
NAME:  [name], [name] NO.:  [id number]   MEDICAL RECORD NO.:  [id number]           PATIENT TYPE:   LOCATION:                                 FACILITY:   PHYSICIAN:  Felizardo Hoffmann, M.D.  DATE OF BIRTH:  December 11, 1970   DATE OF CONSULTATION:  10/11/2007  DATE OF DISCHARGE:                                 CONSULTATION   REQUESTING PHYSICIAN:  Domingo Cocking. Jimmye Norman, M.D.   REASON FOR CONSULTATION:  Opioid dependence.   HISTORY OF PRESENT ILLNESS:  [name] is a 40 year old female  admitted to the [facility] on October 09, 2007, for nausea,  vomiting, and diarrhea.  The patient has been using heroin and cocaine  regularly.  She has been using 6 bags of heroin a day.  She uses crack  about twice a month.   The patient does not have thoughts of harming herself or other.  She has  no hallucinations or delusions.  She is oriented to all spheres and  socially appropriate.  Her mood is within normal limits.   PAST PSYCHIATRIC HISTORY:  The patient was admitted to the [facility] in July 2005.  She, at that time, took 4  trazodone and drank 4 beers in order to go to sleep.  She was undergoing  a lot of stress.   The patient was discharged at that time on:  1. Lexapro 10 mg daily.  2. Seroquel 25 mg daily p.r.n. anxiety.  She did not continue the      Lexapro.   FAMILY PSYCHIATRIC HISTORY:  A sister has some form of mental illness.   SOCIAL HISTORY:  The patient has a son and a daughter.  The patient is  divorced.  Education GED.   The patient's children live with her father.  The patient has no recent  alcohol use.  Please see the discussion above regarding her heroin and  cocaine.   PAST MEDICAL HISTORY:  1. Crohn's disease.  2. Hypertension.  3. History of right ureteral stone.  4. Hydronephrosis, status post shunt.  5. Normocytic anemia.  6. Migraine headache.  7. COPD.   MENTAL STATUS EXAM:  The patient is alert and oriented  to all spheres.  Concentration within normal limits.  Memory within normal limits.  Affect slightly anxious.  Mood within normal limits.  Speech within  normal limits.  Thought process logical, coherent, and goal-directed.  No looseness of associations.  Thought content, no thoughts of harming  herself, no thoughts of harming others, no delusions, and no  hallucinations.  Insight is partial.  Judgment is intact.   ASSESSMENT:  AXIS I:  Opioid dependence, cocaine abuse.  AXIS II:  Deferred.  AXIS III:  See past medical history.  AXIS IV:  General medical.  AXIS V:  55.   [name] is not at risk to harm herself or others.  She agrees to use  emergency services immediately for any thoughts of harming herself,  thoughts of harming others, or distress.   The undersigned recommended chemical dependence rehabilitation.  However, the patient  declined and she is not committable.  She wants to  go to a pain clinic.   If the patient changes her mind, we would refer her to a Chemical  Dependency Rehabilitation Unit.      Felizardo Hoffmann, M.D.  Electronically Signed     JW/MEDQ  D:  01/11/2008  T:  01/12/2008  Job:  [id number]

## 2011-01-04 NOTE — H&P (Signed)
[name], [name] NO.:  [id number]   MEDICAL RECORD NO.:  [id number]          PATIENT TYPE:  INP   LOCATION:  3039                         FACILITY:  [facility]   PHYSICIAN:  Talbert Cage, M.D.DATE OF BIRTH:  1971-01-29   DATE OF ADMISSION:  10/16/2007  DATE OF DISCHARGE:                              HISTORY & PHYSICAL   PRIMARY CARE PHYSICIAN:  None.   CHIEF COMPLAINT:  Headache and left hand weakness.   HISTORY OF PRESENT ILLNESS:  [name] is a 40 year old female who was  recently admitted on our service with heroin withdrawal.  She left AMA  on Sunday, February 22.  She comes back today as best as I can tell  because she has had a bad headache since yesterday.  The patient is very  somnolent and difficult to arouse and unable to give details.  Apparently, since leaving Sunday, she has taken multiple packs of heroin  as well as crack.  She states she last used about 2 hours before coming  in.  She does state that her left hand went limp and numb while in the  ED, or at least that was the first time that she noticed it.  She has no  other limpness or weakness.   REVIEW OF SYSTEMS:  Unobtainable.   PAST MEDICAL HISTORY:  Per previous H and P and it includes:  1. Cocaine abuse.  2. Heroin abuse.  3. Hypertension with hypertensive urgency.  4. Right ureteral stone.  5. Hydronephrosis post shunt.  6. Tobacco abuse.  7. Normocytic anemia.  8. Migraine headaches.  9. Depression.  10.Crohn disease.  11.COPD.   MEDICATIONS:  She is taking none.  She was on methadone, clonidine and  Ativan during her previous hospitalization.   ALLERGIES:  NO KNOWN DRUG ALLERGIES   FAMILY HISTORY:  Hypertension, hypothyroidism.   SOCIAL HISTORY:  She is divorced.  She has two children who live with  their father.  She smokes 1-1/2 packs a day.  She does use heroin,  crack, and alcohol.   PHYSICAL EXAMINATION:  VITAL SIGNS:  Temperature 96.7, pulse 65,  respiratory rate 20, blood pressure 174/113 down to 139/81 on its own in  the ED, sating 99 to 100% on room air.  GENERAL:  She was somnolent and difficult to arouse.  HEENT:  Pupils equal, round, reactive to light.  Mucous membranes are  moist.  CARDIOVASCULAR:  Regular rate and rhythm.  LUNGS:  Clear to auscultation bilaterally.  ABDOMEN:  Soft, nondistended, nontender.  EXTREMITIES:  No edema.  NEUROLOGIC EXAM:  She is 3 to 4 out of 5 strength in all four  extremities.  She is unable to extend her left wrist or spread her  fingers.  Otherwise she is uncooperative to complete neuro. exam.  Overall exam was limited by the patient's drowsiness and  uncooperativeness.   LABORATORY DATA:  An I-Stat showed unremarkable electrolytes, and the  CBC was only remarkable for elevated platelets at 475, and an alcohol  level of 9.  UA was negative. TBS was positive for cocaine, opiates and  benzodiazepines.  CT of the head was obtained and was negative.   ASSESSMENT/PLAN:  This is a 40 year old female heroin and crack abuser  with a left hand weakness.  1. Heroin abuse:  She was initially on methadone 20 mg b.i.d. during      her last hospitalization and weaned on 2 mg the day she left.  She      was thinking about inpatient rehab, but there was a waiting list.      She will reinvestigate this possibility when she is more awake.  We      will give her clonidine for withdrawal symptoms and would not start      any methadone or other opiates right now before discussing with      team.  2. Hypertension:  Her blood pressure decreased in the emergency      department with only Ativan and Zofran.  No blood pressure      medicines.  We will restart clonidine as this was chosen during      their last visit to also help with her withdrawal symptoms.  3. Tobacco abuse.  Will offer the patient nicotine patch.  4. Agitation.  Per documentation.  The patient was agitated and      abusive to staff in the  emergency department.  She has only      received 2 mg of Ativan and is very drowsy.  We will have a small      dose of Ativan available p.r.n.  5. Left-handed weakness.  Unsure of the origin.  Cerebrovascular      accident is a possibility especially in light of her recurrent      hypertensive and crack use.  However, there are no other symptoms      that could be elicited.  More possible is a nerve palsy from      passing out and laying in one position for an extended period of      time.  The patient could not elaborate on the events of the last 48      hours and was unsure if she laid in one position for a long period      of time or not.  Her head CT was negative.  Will discuss the      utility of an MRI with the team in the morning.   DISPOSITION:  When the patient is more awake, we need to discuss her  desire and intention to go to rehab or to use again.  If she will not go  to rehab, we will need to release her again.  Disposition also pending  completion of the workup of her left-handed weakness.      Orland Mustard, MD  Electronically Signed      Talbert Cage, M.D.  Electronically Signed    LM/MEDQ  D:  10/16/2007  T:  10/16/2007  Job:  [id number]

## 2011-01-04 NOTE — Discharge Summary (Signed)
[name], RUPERTO NO.:  [id number]   MEDICAL RECORD NO.:  [id number]          PATIENT TYPE:  INP   LOCATION:  3536                         FACILITY:  [facility]   PHYSICIAN:  Dickie La, MD        DATE OF BIRTH:  [birth year]   DATE OF ADMISSION:  10/09/2007  DATE OF DISCHARGE:  10/14/2007                               DISCHARGE SUMMARY   Note the patient left against medical advice without signing required  paperwork.   PRIMARY CARE PHYSICIAN:  Unassigned   DISCHARGE DIAGNOSES:  1. Heroin abuse.  2. Cocaine abuse.  3. Hypertensive urgency.  4. Hypokalemia.  5. Diarrhea.  6. Crohn's disease.  7. Urinary tract infection.  8. Tobacco abuse.   The patient left AMA without receiving any prescriptions.   LABORATORY DATA:  On admission potassium 2.7.  Urine tox screen positive  for opiates and cocaine.  Urinalysis:  Moderate leukocyte esterase, too  numerous to count bacteria.  Labs at discharge potassium 4.  Other  pertinent labs:  Hepatitis C antibody was negative.  Vitamin B12 was  normal at 793.   BRIEF HISTORY OF PRESENT ILLNESS:  This 40 year old female presented to  ER with one week history of nausea, vomiting, and diarrhea.  In the ER  she was found to have a blood pressure of 188/114.  She had a  significant history of cocaine and heroin abuse.  She was without signs  of hypertensive emergency.  She was admitted to the family practice  teaching service.   HOSPITAL COURSE:  1. Hypertension.  The patient received hydralazine 10 mg IV initially      to lower her blood pressure.  She has since started on clonidine      0.1 mg p.o. b.i.d.  Her blood pressure was well-controlled on this      medication.  She was without signs of hypertensive emergency during      her hospitalization.  2. Opioid dependence.  The patient with significant heroin abuse.  She      was started on methadone 20 mg p.o. b.i.d., and this was tapered      down.  She received 10 mg  once daily on the day of discharge, and      this was planned to be her last dose.  Social work as well as      psychiatry were consulted.  The days prior to leaving AMA the      patient had shown interest for inpatient drug rehabilitation.  She      was given large amounts of information on outpatient and inpatient      programs, and at the time that she left AMA was awaiting placement      for inpatient drug rehabilitation.  In addition to methadone she      did receive clonidine both for hypertension and for her withdrawal      symptoms as well as Ativan 0.5 mg p.o. q.4 h. p.r.n.  3. Tobacco abuse.  The patient maintained on nicotine patch while in  the hospital.  4. Hypokalemia felt secondary to her diarrhea.  This was replaced and      was stable at the time of discharge.  5. Diarrhea.  Questionable history of Crohn's disease also likely due      to opioid withdrawal.  This normalized without steroids and had      resolved at the time of discharge.  Of note an ESR was checked.  It      was slightly high at 25.  It was recommended that she have      outpatient follow-up of this prior to her leaving AMA.  6. Urinary tract infection.  She had moderate leukocyte esterase on      her initial urinalysis.  She was treated with Cipro 500 mg b.i.d.      x3 days.  Urine culture was not returned at the time of discharge.      The patient was without fever, flank pain, nausea or vomiting or      other signs of pyelonephritis.   DISPOSITION:  As stated above.  The patient did receive psychiatric  consult and evaluation while at the hospital.  She was felt to have  significant opioid dependence and had expressed interest in receiving  intensive inpatient or outpatient therapy.  The patient did become more  agitated and drug seeking throughout her hospital course, and on  February 22 left AMA without signing required documentation.  It had  been our strong recommendation that she remain in  the hospital until the  appropriate drug treatment was arranged for her.      Clifton Custard, M.D.  Electronically Signed      Dickie La, MD  Electronically Signed    MR/MEDQ  D:  10/14/2007  T:  10/15/2007  Job:  [id number]

## 2011-01-07 NOTE — Discharge Summary (Signed)
NAME:  [name], [name]                         ACCOUNT NO.:  [id number]   MEDICAL RECORD NO.:  [id number]                   PATIENT TYPE:  IPS   LOCATION:  0502                                 [facility]ACILITY:  [facility]   PHYSICIAN:  Carlton Adam, M.D.                   DATE OF BIRTH:  18-Mar-1971   DATE OF ADMISSION:  02/23/2004  DATE OF DISCHARGE:  02/26/2004                                 DISCHARGE SUMMARY   CHIEF COMPLAINT AND PRESENTING ILLNESS:  This was the second admission to  [facility]  for this 40 year old divorced white female.  Took 4 trazodone and drank 4 beers in an effort to go to sleep.  Says she  has been unable to sleep, especially in the past year.  Claims that she  stays stressed about everything.  She has to work a 12-hour shift, her  mother is not healthy.  She says she wakes up in a sweat, has nausea,  abdominal distress, felt hopeless, helpless, worthless, no energy.   PAST PSYCHIATRIC HISTORY:  Was inpatient 6 years prior to this admission,  one detox at [facility].   ALCOHOL AND DRUG HISTORY:  Occasionally over uses alcohol but minimizes it  is a problem.   PAST MEDICAL HISTORY:  Migraines, Crohn's disease.   MEDICATIONS:  Levbid .375 mg twice a day, Frova 2.5 mg at the onset of the  migraine.   PHYSICAL EXAMINATION:  Performed, failed to show any acute findings.   LABORATORY WORKUP:  CBC within normal limits.  Blood chemistries were within  normal limits.  Urine pregnancy test negative.  Drug screening positive for  mephitides and amphetamines, though questionable.   MENTAL STATUS EXAM:  Reveals a sleepy but oriented female, appropriately  groomed and dressed.  Her speech was slurred from being sleepy from being  started on detox.  Her mood was depressed and anxious and affect was  congruent.  Thought processes were clear, rational and goal oriented.  Concentration and memory were basically intact.  There was no evidence of  delusions, no  hallucinations.  Cognition well preserved.   ADMISSION DIAGNOSES:   AXIS I:  1. Rule out generalized anxiety disorder.  2. Major depression, recurrent.   AXIS II:  No diagnosis.   AXIS III:  Migraines, possible irritable bowel syndrome versus Crohn's  disease.   AXIS IV:  Moderate.   AXIS V:  Global assessment of function upon admission 30, highest global  assessment of function in past year 60.Marland Kitchen   COURSE IN HOSPITAL:  She was admitted and started on intensive individual  and group psychotherapy.  She was given Ambien for sleep.  She was started  on Lexapro 10 mg per day.  She was maintained  on the Levbid .375 mg twice a  day and Frova 2.5 mg at the onset of a headache.  She was given some  Seroquel 25 every  6 hours as needed for anxiety.  She endorsed increased  stress, working 12 hour shifts, work was getting to her, has not been  sleeping,  Started to using alcohol 2-4 beers on weekends.  She claims she  used to drink liquor and went to [facility].  Since then she has not done liquor,  minimizes her use of beer.  Claimed that she used Xanax as needed and it was  helpful.  Minimized the use of alcohol.  Had tried Prozac, Zoloft, Paxil  unsuccessfully or with side effects.  Had tolerated Lexapro so far.  By July  6 she was very upset because she was experiencing pain in her legs, did not  feel she could go back to the 12-hour shifts.  Sense of hopelessness and  helplessness.  There was a  family session with the boyfriend.  She was very  insightful, although she was not at a point of being baseline she was stable  enough that both her and the boyfriend felt that she could safely go home.  On July 7, she was in full contact with reality, no suicidal or homicidal  ideas, no hallucinations, no delusions, brighter, more communicative,  evidence of increased insight, how to take care of herself.  Willing to  abstain from using the alcohol and to continue follow-up on an outpatient   basis.   DISCHARGE DIAGNOSES:   AXIS I:  1. Generalized anxiety disorder.  2. Depressive disorder not otherwise specified.  3. Alcohol abuse.   AXIS II:  No diagnosis.   AXIS III:  Migraines.   AXIS IV:  Moderate.   AXIS V:  Global assessment of function upon discharge 55-60.   DISCHARGE MEDICATIONS:  1. Lexapro 10 mg per day.  2. Levsinex 0.375 mg twice a day.  3. Valtrex 500 2 3 times a day for 6 days.  4. Seroquel 25 1 twice a day as needed for anxiety.   DISPOSITION:  Follow up at [facility].                                               Carlton Adam, M.D.    IL/MEDQ  D:  03/23/2004  T:  03/24/2004  Job:  [id number]

## 2011-01-07 NOTE — H&P (Signed)
NAME:  [name], [name]                         ACCOUNT NO.:  [id number]   MEDICAL RECORD NO.:  [id number]                   PATIENT TYPE:  IPS   LOCATION:  0502                                 FACILITY:  [facility]   PHYSICIAN:  Rulon Eisenmenger, M.D.              DATE OF BIRTH:  17-Jun-1971   DATE OF ADMISSION:  02/23/2004  DATE OF DISCHARGE:                         PSYCHIATRIC ADMISSION ASSESSMENT   PATIENT IDENTIFICATION:  This is a voluntary admission.  This is a 40-year-  old divorced white female.   HISTORY OF PRESENT ILLNESS:  Apparently, she took four trazodone and drank  four beers last night in an effort to go to sleep.  She states that she has  not been able to sleep, especially in the past year.  She stays stressed  about everything.  Her mother is 14 and not healthy.  She has to work 12  hour shifts.  She just stays worried.  She states she wakes up in a sweat.  She has nausea, abdominal distress.  She feels hopeless, helpless,  worthless.  She has no energy.  She denies delusions and paranoia.  She  denies suicidal or homicidal ideation.   PAST PSYCHIATRIC HISTORY:  Apparently approximately six years ago, she had  one detoxification at [facility].  She denies going to counseling or seeing a  psychiatrist.   SUBSTANCE ABUSE HISTORY:  She smokes one and a half packs per day for 20  years.  She occasionally overuses alcohol but she states it is not a  problem.   PAST MEDICAL HISTORY:  Primary care [name]: She uses [facility].  Medical problems: Migraines.  Her father was known to have Crohn's disease.  The patient has pain in her abdomen and is supposed to be undergoing an  evaluation for Crohn's.  She is currently prescribed Levbid 0.375 mg one  b.i.d. and Frova 2.5 mg at the onset of a migraine.   DRUG ALLERGIES:  PENICILLIN, she gets welts.   PHYSICAL EXAMINATION:  GENERAL:  Her physical examination was not repeated  as she was evaluated in the emergency room last  night.  Her urine drug  screen showed she was positive for barbiturates and amphetamines and her  alcohol level was 176.   SOCIAL HISTORY:  She has a GED from high school.  She was married once.  She  has two children, a son age 3 and a daughter age 51.  They live with their  father who also lives with his mother.   FAMILY HISTORY:  She states that she has a sister who has physical and  mental illness.  She is not sure what medications her sister takes.   MENTAL STATUS EXAM:  Today, she is sleepy but oriented x 3.  She is  appropriately groomed and dressed.  Her sleep is slurred from being sleepy  and from having been started on detoxification protocol.  Her mood is  depressed and anxious.  Her affect is congruent.  Her thought process is  clear, rational, and goal oriented.  Her concentration and memory are  basically intact.  Her judgment and insight are fair.  Her intelligence is  average.   ADMISSION DIAGNOSES:   AXIS I:  1. Generalized anxiety disorder.  2. Depression.   AXIS II:  Deferred.   AXIS III:  1. Migraines.  2. Possible irritable bowel syndrome.   AXIS IV:  Moderate, problems with primary support group, occupational  problems, and economic problems.   AXIS V:  30   INITIAL PLAN OF CARE:  The plan is admit for safety and stabilization, to  help her through withdrawal from alcohol, and to begin antidepressant  anxiolytic medication.     Elliot Dally, P.A.-C.               Rulon Eisenmenger, M.D.    MD/MEDQ  D:  02/23/2004  T:  02/23/2004  Job:  [id number]

## 2011-01-07 NOTE — Discharge Summary (Signed)
NAME:  [name], [name]               ACCOUNT NO.:  [id number]   MEDICAL RECORD NO.:  [id number]          PATIENT TYPE:  INP   LOCATION:                               [facility]   PHYSICIAN:  Rise Patience, MDDATE OF BIRTH:  05-Jan-1971   DATE OF ADMISSION:  11/26/2007  DATE OF DISCHARGE:  11/30/2007                               DISCHARGE SUMMARY   ADDENDUM TO DISCHARGE SUMMARY:  Patient had called back stating that she  cannot afford Norvasc and wanted a different medication, so Norvasc can  be changed to clonidine 0.1 mg p.o. twice daily.  Patient advised to  follow with her primary care physician within a week's time.      Rise Patience, MD  Electronically Signed     ANK/MEDQ  D:  12/03/2007  T:  12/03/2007  Job:  [id number]

## 2011-01-07 NOTE — Consult Note (Signed)
[name], [name] NO.:  [id number]   MEDICAL RECORD NO.:  [id number]          PATIENT TYPE:  INP   LOCATION:  4                         FACILITY:  [facility]   PHYSICIAN:  Kristen Hector, MD     DATE OF BIRTH:  [birth year]   DATE OF CONSULTATION:  DATE OF DISCHARGE:                                 CONSULTATION   REQUESTING PHYSICIAN:  [name], M.D.   UROLOGIST:  [name], M.D.   SURGEON:  Kristen Hector, MD   REASON FOR CONSULTATION:  Right-sided abdominal pain.   HISTORY OF PRESENT ILLNESS:  [name] is a 40 year old female with a  strong family and personal history of kidney stones.  She actually has  had lithotripsy done in the past.  She had an episode of abdominal pain  at that time.  It was very severe and intense, felt to be improved after  the lithotripsy.  She also had a similar episode of pain in the past  couple of days.  She saw that the pain was gradual, then it gradually  intensified to be about an 8-9/10, and instead of being intermittent, it  became constant.  She apparently had some episodes of nausea and  vomiting.  No hematuria, dysuria, or pyuria.  No definite sick contacts  or travel history.  She also has a history of hypertension that has been  poorly controlled.  Because the pain became rather intense, she came to  the emergency room in severe distress with 10/10 pain and very  hypertensive, 190/110.  She received a fair amount of narcotics and  nausea medication and is under a little bit better control.   No hematochezia or melena.  No history of inflammatory bowel disease,  reflux, or ulcer.  No dysphagia or odynophagia.   PAST MEDICAL HISTORY:  1. Hypertension.  2. Nephrolithiasis.   PAST SURGICAL HISTORY:  She had a Diaz.  She has also had a lithotripsy.   MEDICATIONS:  None.   ALLERGIES:  PENICILLIN gives her hives.   SOCIAL HISTORY:  She is single with one child.  She smokes about one-  half pack of  cigarettes a day, given probably about a 40-pack-year  history of tobacco.  She does use cocaine, apparently smoked cocaine a  few days ago.  Denies really any alcohol intake.   She has a strong family history of hypertension and thyroid problems.  A  question of kidney problems as well in the past.   REVIEW OF SYSTEMS:  As per HPI, otherwise in general, no definite fever,  chills, sweats, weight gain, or weight loss.  Eyes are negative.  ENT is  negative.  Cardiac and respiratory are negative.  GI: As noted per HPI,  otherwise negative.  Genitourinary:  Per HPI, otherwise no pyuria or  dysuria.  No definite episodes of pyelo that she can recall.  GYN:  Not  currently menstruating.  No vaginal bleeding or discharge.  MUSCULOSKELETAL:  No joint pain or swelling or definite muscle aches.  SKIN:  No other rashes or allergic contacts.  ALLERGIC:  Negative.  LYMPH:  Negative.  HEPATIC/ENDOCRINE:  Negative.   PHYSICAL EXAMINATION:  VITAL SIGNS:  Her initial vital signs had a blood  pressure of 190/110, respirations 20, pulse 66, 10/10 pain, 100% sats on  room air.  Temperature 98.2.  GENERAL:  She is a well-developed and well-nourished female, near-ideal  body weight.  Somewhat sleepy but will awakened and is oriented x4.  HEENT:  She is normocephalic with no facial asymmetry.  Mucous membranes  are dry.  Nasopharynx and oropharynx are clear.  Eyes:  Pupils are  equal, round and reactive to light.  Extraocular movements are intact.  Sclerae are anicteric or injected.  NECK:  Supple without any masses.  Trachea is midline.  Thyroid appears  to be normal.  HEART:  Regular rate and rhythm.  No murmurs, clicks, rubs.  CHEST:  Clear to auscultation bilaterally.  No wheezes, rales, or  rhonchi.  No pain on rib or sternal compression.  ABDOMEN:  Soft and flat.  No obvious abdominal incisions.  No umbilical  hernia.  She does have some fullness in her right flank, almost a mass  there that is  tender but no frank diffuse peritonitis.  GENITAL:  Normal external female genitalia with no inguinal hernias.  RECTAL:  Deferred.  EXTREMITIES:  No definite clubbing or cyanosis.  JOINTS:  Full range of motion in shoulders, arms, wrists, hips, knees,  and ankles.  LYMPH NODE:  No head, neck, axillary, or groin lymphadenopathy.  SKIN:  No obvious petechiae or purpura.  No other obvious sores or  lesions.   LABORATORY VALUES:  Her white count is elevated at 14.9 with a definite  left-sided shift around 83%.  She does have RBCs in her urinalysis with  moderate hemoglobin.  Nitrates and leukocyte esterase are negative.  Her  electrolytes are actually pretty unremarkable with a normal creatinine  at 0.67.  LFTs are normal as well.   She does have a CT scan of the abdomen with no contrast, which shows a  malrotated kidney that actually instead of lying flat up against, is  actually sort of twisted, going from back to anterior abdominal wall.  She has hydronephrosis.  She has an obvious 6 mm ureteral stone with the  ureter decompressed distal to this.  I do not know, there is maybe a  little bit of inflammation there, but I could not call it a big,  definite, huge, obvious pyelo.  There is no free fluid or free air.  No  evidence of any bowel obstruction.  Her gallbladder seems unremarkable.  The appendix appearing unremarkable.  There is no evidence of any  diverticulitis or any other abnormalities.  The uterus appears to be  normal as well.   ASSESSMENT/PLAN:  85. A 40 year old female with a strong history of kidney stones in the      past with good story at exam concerning for obstructing ureteral      stone, possible pyelonephritis.  I agree with admission, IV fluids.      Urological consultation for lithotripsy versus cystoscopy versus      repositioning of her malrotated kidney.  I do not know if that      really needs to be done.  I do not have a strong evidence that she     has a  general surgical problem but will follow her to make sure we      are not missing anything.  2. Hypertensive:  She is getting  control for that.  3. Cocaine abuse:  Watch out for withdrawal.  4. Tobacco abuse:  Watch out for withdrawal.  5. Hypokalemia.  Replete and double-check on magnesium.      Kristen Hector, MD  Electronically Signed     SCG/MEDQ  D:  11/06/2006  T:  11/07/2006  Job:  [id number]

## 2011-01-07 NOTE — Consult Note (Signed)
[name], [name]               ACCOUNT NO.:  [id number]   MEDICAL RECORD NO.:  [id number]          PATIENT TYPE:  INP   LOCATION:  1423                         FACILITY:  [facility]   PHYSICIAN:  Hanley Ben, M.D.  DATE OF BIRTH:  1970/09/10   DATE OF CONSULTATION:  11/07/2006  DATE OF DISCHARGE:                                 CONSULTATION   REASON FOR CONSULTATION:  Right flank pain.   The patient is a 40 year old female who was admitted on 11/06/2006 with  a 2-day history of gradual onset of right flank and right lower quadrant  pain on Sunday.  The pain progressively got worse, and on Monday she had  severe right flank and right lower quadrant pain.  The pain was  associated with nausea and vomiting.  She called the ambulance and was  brought to the emergency room.   She was found to be in hypertensive crisis.  A CT scan of the abdomen  and pelvis showed a mal rotated right kidney with moderate to marked  hydronephrosis and a 6 mm stone in the proximal right ureter.  She has a  past history of kidney stones and had lithotripsy in 1993.  A CT scan in  2001 showed no evidence of renal or ureteral stone.  She still complains  of right flank and right lower quadrant pain, and she has been getting  analgesics.  She has an indwelling Foley catheter that is now draining  clear urine.   PAST MEDICAL HISTORY:  1. Hypertension.  2. Kidney stone.   PAST SURGICAL HISTORY:  She had lithotripsy in 1993.   ALLERGIES:  PENICILLIN.   MEDICATIONS:  She was then treated last night with a nitroglycerin drip  and clonidine 0.1 mg three times a day.   FAMILY HISTORY:  Positive for hypertension.   SOCIAL HISTORY:  She is single.  She has two children.  Smokes 1-1/2  packs a day.  She does not drink.  There is a history of cocaine abuse.   REVIEW OF SYSTEMS:  Positive for headache at this time and right flank  and right lower quadrant pain.  All others are negative.   PHYSICAL  EXAMINATION:  GENERAL:  This is a well-developed 40 year old  female who is complaining of right flank and right lower quadrant pain.  VITAL SIGNS:  Her blood pressure on admission in the emergency room was  as high as 203/141.  It is now 123/81, pulse 51, respirations 16,  temperature 99.3.  She is somewhat lethargic, probably secondary to pain  medication, but she can easily be aroused.  HEENT:  Her head is normal.  She has nonicteric sclerae.  She has pink  conjunctivae.  NECK:  Neck is supple.  She has no cervical adenopathy, no thyromegaly.  RESPIRATORY:  Lungs are clear.  HEART:  Regular rhythm.  ABDOMEN:  Abdomen is soft, tender in the right flank and right lower  quadrant.  She has moderate right CVA tenderness.  The kidneys are not  palpable.  Bladder is not distended.  She has no inguinal or umbilical  hernia.  Bowel sounds are normal.  She has an indwelling Foley catheter  that is now draining clear urine.  PELVIC:  Pelvic examination is not done.  She has her period at this  time.   Hemoglobin is 12.2, hematocrit 36, WBC 13.1.  Sodium 138, potassium 3.2,  BUN 9, creatinine 0.78.  Urinalysis shows 7-10 WBCs, 3-6 RBCs, nitrite  negative, and 30 mg of protein.   I independently reviewed the CT scan.  It shows a malrotated right  kidney with hydronephrosis and a 6 mm proximal right ureteral calculus.   IMPRESSION:  1. Right ureteral calculus.  2. Hydronephrosis.  3. Hypertension.   SUGGESTIONS:  Cystoscopy, retrograde pyelogram, and insertion of double-  J catheter, followed by ESL as an outpatient.  I discussed this with Dr.  Algis Liming, and he feels that the patient is stable for the procedure and  we will proceed.      Hanley Ben, M.D.  Electronically Signed     MN/MEDQ  D:  11/07/2006  T:  11/08/2006  Job:  [id number]

## 2011-01-07 NOTE — Discharge Summary (Signed)
NAMESHADI, LARNER               ACCOUNT NO.:  [id number]   MEDICAL RECORD NO.:  [id number]          PATIENT TYPE:  INP   LOCATION:  7341                         FACILITY:  [facility]   PHYSICIAN:  Aquilla Hacker, M.D. DATE OF BIRTH:  1970/08/27   DATE OF ADMISSION:  11/14/2007  DATE OF DISCHARGE:  11/16/2007                               DISCHARGE SUMMARY   FINAL DIAGNOSES:  1. Uncontrolled hypertension.  2. Heroin withdrawal.  3. Nausea, vomiting.  4. Hypokalemia.   CONSULTATIONS:  Psychiatry with Dr. Charissa Bash.   HISTORY OF PRESENT ILLNESS:  Ms. Weatherholtz is a 40 year old female with a  past medical history of heroin addiction.  She indicated that she  initially presented to [facility] seeking help with  recovery from her heroin addiction.  During her initial assessment at  [facility], the patient's blood pressure was found to be elevated with a  systolic BP being greater than 200.  She was subsequently referred to  [facility] Emergency Department for evaluation.   For past medical history, please see that the dictated by Dr. Aquilla Hacker.   HOSPITAL COURSE:  1. Uncontrolled hypertension.  It appears that this may have been      multifactorial.  The patient may have an underlying history of      hypertension.  However, her ongoing usage of cocaine, in addition      to her heroin withdrawal may have contributed to the elevated blood      pressure.  She was started on a regimen of antihypertensive      medications during the course of this hospitalization.  By the date      of discharge, her blood pressure was better controlled.  2. Heroin withdrawal.  The patient openly admitted that she had an      addiction to heroin.  Symptomatic management of her symptoms was      provided during the course of this hospitalization.  Psychiatry was      consulted to help with regards to facilitate in the transfer of the      patient from the hospital to [facility].  3. Ongoing cocaine  addiction.  The patient did have a urine drug      screen completed, which tested positive for cocaine.  Again,      inpatient treatment at [facility] was sought after.  4. Intractable nausea and vomiting.  This may have been associated      with the patient's withdrawal from her heroin.  IV antiemetics were      provided to the patient on a p.r.n. basis.  By the date of      discharge, her nausea and vomiting had resolved.  5. Hypokalemia.  Potassium supplementation was provided to the      patient.   CONDITION AT THE TIME OF DISCHARGE:  On the date of discharge, the  patient indicated that she felt better, although she was not completely  back to her baseline.  Her blood pressure was better, however.  The  decision was made to transfer the patient from the hospital to  [facility].  Dr. Charissa Bash was consulted to help with  regards to expediting this transfer.  He saw the patient on November 15, 2007.  His impression was that the patient  suffered from opiate dependence, which was chronic and severe as well as  opiate withdrawal and cocaine abuse.  He indicated that the patient did  not have any obvious concurrent psychiatric disorder and the patient was  transferred from the hospital on November 16, 2007.      Aquilla Hacker, M.D.  Electronically Signed     OR/MEDQ  D:  12/12/2007  T:  12/12/2007  Job:  [id number]

## 2011-01-07 NOTE — H&P (Signed)
NAME:  [name], [name] NO.:  [id number]   MEDICAL RECORD NO.:  [id number]          PATIENT TYPE:  EMS   LOCATION:  ED                           FACILITY:  [facility]   PHYSICIAN:  Vernell Leep, MD     DATE OF BIRTH:  [birth year]   DATE OF ADMISSION:  11/06/2006  DATE OF DISCHARGE:                              HISTORY & PHYSICAL   PRIMARY MEDICAL DOCTOR:  Patient is unassigned to the [facility].   UROLOGIST:  Hanley Ben, M.D.   CHIEF COMPLAINT:  Right-sided abdominal pain and back pain.   HISTORY OF PRESENT ILLNESS:  [name] is a pleasant 40 year old  Caucasian female patient with a history of hypertension of unknown  duration, she says maybe five years.  She does not follow up with any  primary medical doctor.  Currently, the patient is slightly drowsy and  has to be aroused.  This is probably secondary to some of the pain  medications and anti-nausea medications she has received in the  emergency room; however, she is coherent when awake.  History is also  being provided by the patient's sister and other family at the bedside.  Patient says that she was in apparently good health until 2-3 days ago  when she noticed a right-sided lower abdominal/back pain, gradual in  onset, which has progressively been getting worse, 8-9/10 in severity,  intermittent.  Subsequently became constant.  She is unable to say what  type of pain it is.  She denies any history of dysuria, urgency, or  frequency associated with it.  She had unknown episodes of nausea and  vomiting.  She also claims fever and chills.  She has no diarrhea or  constipation.  For these symptoms, she presented to the emergency room,  where further evaluation has revealed markedly elevated blood pressure.  The patient is currently on a nitroglycerin drip.  Also, CAT scan of the  abdomen was positive for a right proximal ureteral stone and  hydronephrosis.   PAST MEDICAL HISTORY:   Hypertension, questionable five years duration.   PAST SURGICAL HISTORY:  1. LEEP.  2. Lithotripsy.   ALLERGIES:  PENICILLIN, said to give her hives.   MEDICATIONS:  None.   FAMILY HISTORY:  A strong family history of hypertension in sister and  mother also a history of thyroid problems.   SOCIAL HISTORY:  Patient is single.  Has one child.  Smokes 1-1/2 packs  of cigarettes per day.  Denies alcohol intake.  Said to abuse cocaine.  She smoked cocaine, the last time, this was a couple of days ago.   REVIEW OF SYSTEMS:  HEENT:  Patient denies headache, earache, sore  throat, visual disturbances.  GENERAL:  With fever, chills, and rigors.  Denies generalized weakness.  RESPIRATORY:  No wheezing, tightness of  the chest, dyspnea, or cough.  CARDIOVASCULAR:  No chest pain,  palpitations, orthopnea, PND.  ABDOMEN/GI:  Per history of present  illness.  GENITOURINARY:  Per history of present illness.  currently  menstruating.  MUSCULOSKELETAL:  No joint pain or swelling.  SKIN:  Without any rashes.   PHYSICAL EXAMINATION:  GENERAL:  [name] is a moderately built and  nourished female patient in no obvious cardiopulmonary or painful  distress at this time.  VITAL SIGNS:  On arrival to the emergency room, where blood pressure  190/110, then has gone as high as 203/141.  Currently, on the monitor,  it is 160/106.  Temperature 98.2, pulse 78, respirations 16 per minute,  saturating at 100%.  HEENT:  Normocephalic and atraumatic.  Pupils are equal and reactive to  light and accommodation.  Unable to visualize the patient's fundus  secondary to poor patient cooperation at this time.  No pharyngeal  erythema.  NECK:  Left-sided posterior cervical less than 1 cm multiple, nontender  lymphadenopathy.  No goiter, JVD, carotid bruit.  Supple.  RESPIRATORY:  Clear to auscultation bilaterally.  CARDIOVASCULAR:  First and second heart sounds are heard.  No third or  fourth heart sounds.  No  murmurs, rubs, gallops, or clicks.  ABDOMEN:  Nondistended but tender in the right, middle, and lower  quadrant with guarding.  No rigidity or rebound.  No organomegaly or  mass palpable.  Bowel sounds are normally heard.  No renal angle  tenderness.  CENTRAL NERVOUS SYSTEM:  Patient, as indicated, is drowsy, but easily  arousable, oriented to person, place, and time, with no focal deficits.  EXTREMITIES:  No clubbing, cyanosis or edema.  Peripheral pulses are  symmetrically felt.   LAB DATA:  Her CBC with white blood cells of 14.9.  The rest of it is  normal.  Basic metabolic panel with a potassium of 3.1, glucose 127.  The rest is normal.  Urine pregnancy test is negative.  Urinalysis is  positive for 100 glucose, moderate amount of blood, 7-10 RBCs per high-  powered field, negative for nitrates and leukocytes.   CT scan of the abdomen without contrast, impression is ptotic and large  hydronephrotic right kidney with 6 mm proximal right ureteral stone.   Pelvic CT without contrast, impression is no acute intrapelvic finding.  Trace pelvic fluid may be physiological in this pre-menopausal female.   ASSESSMENT/PLAN:  1. Hypertensive emergency:  Will admit the patient to the step-down      unit.  Will evaluate for secondary causes of hypertension.  Will      obtain a chest x-ray, EKG, thyroid function tests, 24-hour urine      for metanephrines, urine toxicology screen.  Will continue      nitroglycerin drip and titrate BP.  Will also place the patient on      clonidine and cardizem p.o and IV hydrallazine PRN.  Once the      patient's blood pressure is adequately controlled, will taper and      d/c NTG drip.  2. Right lower abdominal and back pain, questionable secondary to her      obstructing nephrolithiasis and right hydronephrosis.  Will obtain      urology consult and will also have surgery evaluate the patient and      place the patient on n.p.o. and pain meds and IV  fluids. 3. Hypokalemia:  To be repleted and follow up BMET.  4. Leukocytosis:  Questionable for stress.  No overt signs of sepsis.      To monitor.  5. Tobacco abuse:  Nicotine patch and tobacco cessation counseling.  6. Substance abuse:  Counseling.      Vernell Leep, MD  Electronically Signed     AH/MEDQ  D:  11/06/2006  T:  11/06/2006  Job:  [id number]   cc:   Barton Fanny, M.D.  Fax: [phone]   Hanley Ben, M.D.  Fax: [phone]

## 2011-01-07 NOTE — Op Note (Signed)
[facility]  Patient:    [name], [name]                      MRN: [id number] Adm. Date:  [id number] Attending:  Richarda Blade                           Operative Report  PREOPERATIVE DIAGNOSIS:       Cyclic pelvic pain and dysmenorrhea.  POSTOPERATIVE DIAGNOSIS:      Cyclic pelvic pain and dysmenorrhea.  OPERATION:                    Laparoscopy with laser uterosacral nerve ablation procedure.  SURGEON:                      Selinda Orion, M.D.  DESCRIPTION OF PROCEDURE:     The patient was placed in the lithotomy position, prepped and draped in the usual fashion.  The bladder was emptied. a transverse incision was made in the abdomen and the abdomen was distended with carbon dioxide using a Veress needle.  Aspiration infusion was done to be sure that the needle was in the correct position.  The abdomen was distended with about 2 L of CO2 and a trocar was inserted into the abdomen. Visualization of the pelvis revealed two normal tubes and ovaries.  She was status post tubal ligation.  There was no overt evidence of old, new or atypical endometriosis.  There were no windows.  There was an area on the left ovary that was cauterized with the YAG laser along with the LUNA procedure. No other abnormalities were noted.  The gallbladder and liver all appeared normal.  Gas was evacuated and the skin was closed.  The umbilical incision was closed with an 0 Vicryl suture on a UR-6 needle and then some 4-0 Dexon. A dry sterile dressing was applied.  She was awakened and carried to the recovery room in good condition. DD:  10/19/00 TD:  10/19/00 Job: [id number]

## 2011-01-07 NOTE — Op Note (Signed)
NAMEDALENA, [name]               ACCOUNT NO.:  [id number]   MEDICAL RECORD NO.:  [id number]          PATIENT TYPE:  INP   LOCATION:  1423                         FACILITY:  [facility]   PHYSICIAN:  Hanley Ben, M.D.  DATE OF BIRTH:  05/04/1971   DATE OF PROCEDURE:  11/07/2006  DATE OF DISCHARGE:                               OPERATIVE REPORT   PREOPERATIVE DIAGNOSES:  1. Right ureteral calculus.  2. Right hydronephrosis.  3. Malrotated right kidney.   POSTOPERATIVE DIAGNOSES:  1. Right ureteral calculus.  2. Right hydronephrosis.  3. Malrotated right kidney.   PROCEDURE:  Cystoscopy, right retrograde pyelogram and insertion of  double-J catheter.   SURGEON:  Arvil Persons, M.D.   ANESTHESIA:  General.   INDICATIONS FOR PROCEDURE:  The patient is a 40 year old female who was  admitted on November 06, 2006, with a 2-3 days history of sudden onset of  right flank pain.  The pain was associated with nausea and vomiting.  CT  scan showed a 6 mm proximal right ureteral calculus with moderate to  marked hydronephrosis and the malrotated kidney.  She is scheduled for  cystoscopy, retrograde pyelogram and insertion of double-J catheter.   Under general anesthesia, the patient was prepped and draped and placed  in the dorsal lithotomy position.  A #22 panendoscope was inserted in  the bladder.  The bladder mucosa is normal.  There is no stone or tumor  in the bladder.  The ureteral orifices are in normal position and shape.  A Glidewire was passed through a #6-French open-ended catheter and  passed through the cystoscope and the Glidewire was advanced through the  open-ended catheter into the right ureteral orifice.  The open-ended  catheter was then advanced over the Glidewire into the distal ureter.  The Glidewire was then removed.   Retrograde pyelogram:  Contrast was then injected through the open-ended catheter. The distal  and mid ureter appear normal.  There is a filling defect  in the upper  ureter with moderate dilation of the proximal ureter and the renal  pelvis and collecting system.  Then the Glidewire was passed through the  open-ended catheter and the open-ended catheter was removed.   A #6-French - 24 double-J catheter was passed over the Glidewire.  The  proximal curl of the double-J catheter is in the renal pelvis.  The  distal curl is in the bladder.  The bladder was then emptied and the  cystoscope and Glidewire removed.   The patient tolerated the procedure well and left the OR in satisfactory  condition to post anesthesia care unit.      Hanley Ben, M.D.  Electronically Signed     MN/MEDQ  D:  11/07/2006  T:  11/08/2006  Job:  [id number]

## 2011-01-07 NOTE — Discharge Summary (Signed)
NAMEKADIE, BALESTRIERI NO.:  [id number]   MEDICAL RECORD NO.:  [id number]          PATIENT TYPE:  INP   LOCATION:  0175                         FACILITY:  [facility]   PHYSICIAN:  Vernell Leep, MD     DATE OF BIRTH:  1971/07/20   DATE OF ADMISSION:  11/06/2006  DATE OF DISCHARGE:  11/08/2006                               DISCHARGE SUMMARY   DISCHARGE SUMMARY   PRIMARY CARE PHYSICIAN:  The patient is unassigned to the [place] [facility].   UROLOGIST:  Dr. Janice Norrie   DISCHARGE DIAGNOSES:  1. Hypertensive crisis.  2. Hypertension.  3. Right ureteral stone and hydronephrosis status post shunt.  4. Hypokalemia.  5. Tobacco abuse.  6. Substance abuse.  7. Normocytic anemia.   DISCHARGE MEDICATIONS:  1. Cardizem CD 120 mg p.o. daily.  2. Clonidine 0.1 mg p.o. b.i.d.  3. Nicotine patch 21 mg per 24 hours/1 patch daily for 6 weeks and      then taper.  4. K-Dur 20 mEq tablets 2 p.o. daily for 3 days.   PROCEDURES:  1. Echocardiogram on November 07, 2006.  Summary - LV systolic function      was normal.  LV ejection fraction was estimated between 55% to 65%.      LV wall thickness was at the upper limits of normal and there was      high left ventricular filling pressures.  2. November 06, 2006 - a chest x-ray.  Impression - COPD, no active      disease.  3. November 06, 2006 - CT of the head without contrast.  Impression -      negative noncontrast head CT.  4. November 06, 2006 - abdominal CT without contrast.  Impression -      ptotic, enlarged hydronephrotic right kidney with a 6 mm proximal      right ureteral stone.  5. CT pelvis without contrast on November 06, 2006.  Impression - no      acute intrapelvic findings.  Trace free pelvic fluid, may be      physiological in this premenopausal female.   RECENT LABORATORY DATA:  His CBC with hemoglobin 11.2, hematocrit 33,  MCV 92, white cells 7.6, platelets 240.  Basic metabolic panel:  Sodium  102, potassium 3.3,  chloride 112, bicarb is 25, glucose 95, BUN 7,  creatinine 0.57.  Hepatic panel was unremarkable.  Magnesium of 1.9,  hemoglobin A1c of 5.8, free T4 of 0.87, TSH of 0.313.  Urine pregnancy  test was negative.  Urine toxicology was positive for cocaine and  opiates.  Urine analysis was with moderate amount of blood, 30 protein,  negative for nitrites, small leukocytes and rare bacteria.   CONSULTATIONS:  1. Surgery from [facility], Dr. Johney Maine.  2. Urology from Dr. Janice Norrie.   HOSPITAL COURSE AND PATIENT DISPOSITION:  For details of the initial  part of the admission, please refer to the History and Physical note  that was done by Dr. Algis Liming on November 06, 2006.  In summary, Ms. Kellison  is a 40 year old, pleasant, Caucasian  female with a history of  hypertension not on any medications, and nephrolithiasis who presented  with right-sided abdomen/flank pain.  On further evaluation she was  noted to have markedly elevated blood pressures and findings suggestive  of a right proximal ureteral stone and hydronephrosis.  The patient was  admitted for further evaluation and management.   1. Hypertensive crisis.  Patient had significantly elevated blood      pressures as high as 203/141.  The patient was drowsy in the      emergency room; however, it was probably secondary to the pain      medications and the anti-nausea medications that she had received.      However, the patient was assessed as having hypertensive crisis,      was admitted to the ICU.  She was placed on an IV nitroglycerin      drip and was started on p.o. Cardizem and Clonidine.  Over the next      few hours, the patient's blood pressure nicely trended down and her      nitroglycerin drip was discontinued and subsequently she has      remained on the oral Cardizem and Clonidine with good control of      her blood pressures.  Evaluation for secondary causes of      hypertension has partially been completed.  The  patient's      echocardiogram, thyroid function tests, urine analysis are as      indicated above.  Further workup is to be done as an outpatient as      deemed necessary.  Also, her recent use of cocaine might have      contributed to this markedly elevated blood pressure in this young      female patient.  The choice of antihypertensives: considering that      the patient was slightly dehydrated, diuretics were not used; Beta      blockers were not used because of this history of cocaine use; Ace      inhibitors because we were unclear if the patient had any kind of      renal artery problems.  2. Hypertension which is controlled at this time.  The patient has      been advised compliance with the antihypertensive medications;      however, she already indicates that she is not going to take the      medications because she does not have a job, does not have any      insurance, does not want to go and wait at health services to be      seen; however, we have encouraged her and will continue to counsel      her to try to be compliant with her medications and have explained      the risks of not taking her blood pressure medications and      following up with her PMD including multiple organ dysfunctions and      death.  She verbalizes understanding. Provisions are made for her      to get adequate supply of medications until she gets seen at health      serv. An appointment also has been made for her at health serv.  3. Right proximal ureteral stone with hydronephrosis - Urology was      consulted.  They proceeded to do a cystogram and a stent placement      on March 18, and have indicated that she  is stable from their point      of view to be discharged and followed up as an outpatient.  4. Tobacco abuse.  Patient has been placed on a Nicotine patch and      provided tobacco cessation counseling.  5. Substance abuse.  Patient has been provided substance abuse      counseling. 6.  Normocytic anemia to be worked up as an outpatient as deemed      necessary.  7. Abnormal thyroid function test.  The patient is clinically      euthyroid, however the thyroid function tests are marginally      abnormal and these have to be repeated as an outpatient and      followed up as deemed necessary.  8. Hypokalemia= for PO repletion and BMET f/u at outpatient.      Vernell Leep, MD  Electronically Signed     AH/MEDQ  D:  11/08/2006  T:  11/08/2006  Job:  [id number]   cc:   Hanley Ben, M.D.  Fax: [phone]   Barton Fanny, M.D.  Fax: [phone]

## 2011-03-18 ENCOUNTER — Emergency Department (HOSPITAL_COMMUNITY)
Admission: EM | Admit: 2011-03-18 | Discharge: 2011-03-18 | Discharge status: Against Medical Advice | Payer: Self-pay | Attending: Emergency Medicine | Admitting: Emergency Medicine

## 2011-03-19 ENCOUNTER — Emergency Department (HOSPITAL_COMMUNITY)
Admission: EM | Admit: 2011-03-19 | Discharge: 2011-03-19 | Disposition: A | Discharge status: Home / Self Care | Payer: Self-pay | Attending: Emergency Medicine | Admitting: Emergency Medicine

## 2011-03-19 DIAGNOSIS — I10 Essential (primary) hypertension: Secondary | ICD-10-CM | POA: Insufficient documentation

## 2011-03-19 DIAGNOSIS — R10816 Epigastric abdominal tenderness: Secondary | ICD-10-CM | POA: Insufficient documentation

## 2011-03-19 DIAGNOSIS — I252 Old myocardial infarction: Secondary | ICD-10-CM | POA: Insufficient documentation

## 2011-03-19 DIAGNOSIS — F1920 Other psychoactive substance dependence, uncomplicated: Secondary | ICD-10-CM | POA: Insufficient documentation

## 2011-03-19 DIAGNOSIS — F19939 Other psychoactive substance use, unspecified with withdrawal, unspecified: Secondary | ICD-10-CM | POA: Insufficient documentation

## 2011-05-13 LAB — I-STAT 8, (EC8 V) (CONVERTED LAB)
Acid-Base Excess: 5 — ABNORMAL HIGH
BUN: 5 — ABNORMAL LOW
Bicarbonate: 24.4 — ABNORMAL HIGH
Bicarbonate: 26 — ABNORMAL HIGH
Chloride: 103
HCT: 38
HCT: 46
Hemoglobin: 12.9
Hemoglobin: 15.6 — ABNORMAL HIGH
Operator id: 277751
Operator id: 285491
Sodium: 137
Sodium: 138
pCO2, Ven: 23.3 — ABNORMAL LOW
pCO2, Ven: 45.3

## 2011-05-13 LAB — BASIC METABOLIC PANEL
BUN: 17
Chloride: 106
Chloride: 109
Creatinine, Ser: 0.69
GFR calc non Af Amer: >60
Glucose, Bld: 101 — ABNORMAL HIGH
Glucose, Bld: 74
Potassium: 3.7
Potassium: 4
Sodium: 139

## 2011-05-13 LAB — DIFFERENTIAL
Basophils Absolute: 0.1
Basophils Relative: 0
Eosinophils Absolute: 0
Eosinophils Absolute: 0.2
Eosinophils Relative: 0
Eosinophils Relative: 3
Lymphocytes Relative: 28
Monocytes Absolute: 0.4
Monocytes Relative: 2 — ABNORMAL LOW
Neutrophils Relative %: 89 — ABNORMAL HIGH

## 2011-05-13 LAB — CBC
HCT: 32 — ABNORMAL LOW
HCT: 33.5 — ABNORMAL LOW
HCT: 34.7 — ABNORMAL LOW
HCT: 42.5
Hemoglobin: 10.9 — ABNORMAL LOW
Hemoglobin: 11.5 — ABNORMAL LOW
Hemoglobin: 11.9 — ABNORMAL LOW
MCHC: 33.8
MCHC: 34.1
MCHC: 34.2
MCV: 86.5
MCV: 86.8
MCV: 87.1
MCV: 87.1
MCV: 87.3
Platelets: 439 — ABNORMAL HIGH
Platelets: 466 — ABNORMAL HIGH
Platelets: 475 — ABNORMAL HIGH
RBC: 3.67 — ABNORMAL LOW
RBC: 5.01
RDW: 14.7
WBC: 6.3
WBC: 6.4
WBC: 8.3

## 2011-05-13 LAB — CARDIAC PANEL(CRET KIN+CKTOT+MB+TROPI)
CK, MB: 0.5
CK, MB: 0.6
Relative Index: INVALID
Relative Index: INVALID
Total CK: 10
Total CK: 28
Troponin I: 0.01
Troponin I: 0.03

## 2011-05-13 LAB — RAPID URINE DRUG SCREEN, HOSP PERFORMED
Barbiturates: NOT DETECTED
Barbiturates: NOT DETECTED
Benzodiazepines: NOT DETECTED
Benzodiazepines: POSITIVE — AB
Cocaine: POSITIVE — AB

## 2011-05-13 LAB — HEPATIC FUNCTION PANEL
Albumin: 3.6
Indirect Bilirubin: 0.7
Total Bilirubin: 0.9
Total Protein: 8.1

## 2011-05-13 LAB — URINALYSIS, ROUTINE W REFLEX MICROSCOPIC
Bilirubin Urine: NEGATIVE
Glucose, UA: NEGATIVE
Glucose, UA: NEGATIVE
Ketones, ur: NEGATIVE
Protein, ur: 30 — AB
Protein, ur: NEGATIVE
Specific Gravity, Urine: 1.021
Urobilinogen, UA: 0.2

## 2011-05-13 LAB — ETHANOL: Alcohol, Ethyl (B): 9

## 2011-05-13 LAB — COMPREHENSIVE METABOLIC PANEL
ALT: 19
BUN: 3 — ABNORMAL LOW
CO2: 23
Calcium: 8.9
GFR calc non Af Amer: >60
Glucose, Bld: 126 — ABNORMAL HIGH
Sodium: 136
Total Protein: 7.8

## 2011-05-13 LAB — CK TOTAL AND CKMB (NOT AT ARMC)
CK, MB: 0.4
Total CK: 27

## 2011-05-13 LAB — VITAMIN B12: Vitamin B-12: 793 (ref 211–911)

## 2011-05-13 LAB — POCT I-STAT CREATININE: Operator id: 285491

## 2011-05-13 LAB — SEDIMENTATION RATE: Sed Rate: 25 — ABNORMAL HIGH

## 2011-05-13 LAB — POTASSIUM: Potassium: 3.3 — ABNORMAL LOW

## 2011-05-13 LAB — HEPATITIS C ANTIBODY: HCV Ab: NEGATIVE

## 2011-05-13 LAB — URINE MICROSCOPIC-ADD ON

## 2011-05-13 LAB — TROPONIN I: Troponin I: 0.01

## 2011-05-16 LAB — COMPREHENSIVE METABOLIC PANEL
AST: 15
CO2: 20
Chloride: 106
Creatinine, Ser: 0.68
GFR calc Af Amer: >60
GFR calc non Af Amer: >60
Total Bilirubin: 0.8

## 2011-05-16 LAB — BASIC METABOLIC PANEL
BUN: 5 — ABNORMAL LOW
BUN: 6
Chloride: 106
Creatinine, Ser: 0.72
GFR calc non Af Amer: >60
Glucose, Bld: 109 — ABNORMAL HIGH
Potassium: 3.2 — ABNORMAL LOW
Potassium: 3.3 — ABNORMAL LOW

## 2011-05-16 LAB — CBC
HCT: 39.4
HCT: 40.9
Hemoglobin: 13.5
MCHC: 34.5
MCV: 89.1
MCV: 89.1
Platelets: 262
RBC: 4.43
RDW: 16.9 — ABNORMAL HIGH
WBC: 9.3

## 2011-05-17 LAB — CK TOTAL AND CKMB (NOT AT ARMC)
CK, MB: 0.2 — ABNORMAL LOW
Relative Index: INVALID
Total CK: 20

## 2011-05-17 LAB — DIFFERENTIAL
Basophils Absolute: 0
Basophils Absolute: 0.1
Basophils Relative: 1
Basophils Relative: 0
Basophils Relative: 2 — ABNORMAL HIGH
Eosinophils Absolute: 0.2
Eosinophils Absolute: 0
Eosinophils Absolute: 0.1
Eosinophils Relative: 0
Eosinophils Relative: 1
Lymphocytes Relative: 25
Lymphs Abs: 1.8
Lymphs Abs: 2
Monocytes Absolute: 0.4
Monocytes Absolute: 0.3
Monocytes Absolute: 0.4
Monocytes Relative: 6
Monocytes Relative: 4
Monocytes Relative: 5
Neutro Abs: 4.2
Neutro Abs: 5.5
Neutro Abs: 6.1
Neutrophils Relative %: 69

## 2011-05-17 LAB — COMPREHENSIVE METABOLIC PANEL
ALT: 15
Albumin: 4.1
Alkaline Phosphatase: 82
Calcium: 8.9
Potassium: 3.3 — ABNORMAL LOW
Sodium: 137
Total Protein: 7.6

## 2011-05-17 LAB — CBC
HCT: 33.8 — ABNORMAL LOW
HCT: 41.3
Hemoglobin: 11.6 — ABNORMAL LOW
Hemoglobin: 14.2
MCHC: 34.4
MCHC: 34.5
MCV: 89.1
MCV: 89.7
Platelets: 291
Platelets: 289
RBC: 3.77 — ABNORMAL LOW
RDW: 17.1 — ABNORMAL HIGH
RDW: 15.3
RDW: 16.3 — ABNORMAL HIGH
WBC: 8

## 2011-05-17 LAB — BASIC METABOLIC PANEL
CO2: 23
Calcium: 9.4
Chloride: 104
GFR calc non Af Amer: >60
Glucose, Bld: 119 — ABNORMAL HIGH
Potassium: 3.4 — ABNORMAL LOW
Sodium: 136
Sodium: 137

## 2011-05-17 LAB — BASIC METABOLIC PANEL WITH GFR
BUN: 8
CO2: 24
Calcium: 8.5
Chloride: 105
Creatinine, Ser: 0.77
GFR calc non Af Amer: >60
Glucose, Bld: 179 — ABNORMAL HIGH
Potassium: 3.2 — ABNORMAL LOW
Sodium: 135

## 2011-05-17 LAB — CARDIAC PANEL(CRET KIN+CKTOT+MB+TROPI)
CK, MB: 0.3
CK, MB: 0.3
Relative Index: INVALID
Relative Index: INVALID
Total CK: 16
Total CK: 20
Troponin I: 0.01

## 2011-05-17 LAB — PREGNANCY, URINE
Preg Test, Ur: NEGATIVE
Preg Test, Ur: NEGATIVE

## 2011-05-17 LAB — URINALYSIS, ROUTINE W REFLEX MICROSCOPIC
Bilirubin Urine: NEGATIVE
Bilirubin Urine: NEGATIVE
Hgb urine dipstick: NEGATIVE
Ketones, ur: NEGATIVE
Nitrite: POSITIVE — AB
Nitrite: NEGATIVE
Protein, ur: NEGATIVE
Protein, ur: 30 — AB
Specific Gravity, Urine: 1.024
Urobilinogen, UA: 1
Urobilinogen, UA: 0.2

## 2011-05-17 LAB — POCT CARDIAC MARKERS
CKMB, poc: <1 — ABNORMAL LOW
Myoglobin, poc: 22.6
Operator id: 4074
Troponin i, poc: <0.05

## 2011-05-17 LAB — RAPID URINE DRUG SCREEN, HOSP PERFORMED
Amphetamines: NOT DETECTED
Amphetamines: NOT DETECTED
Barbiturates: NOT DETECTED
Benzodiazepines: NOT DETECTED
Benzodiazepines: POSITIVE — AB
Cocaine: POSITIVE — AB
Cocaine: POSITIVE — AB
Opiates: NOT DETECTED
Tetrahydrocannabinol: POSITIVE — AB
Tetrahydrocannabinol: NOT DETECTED

## 2011-05-17 LAB — TROPONIN I: Troponin I: 0.03

## 2011-05-17 LAB — POTASSIUM: Potassium: 4

## 2011-05-17 LAB — ETHANOL: Alcohol, Ethyl (B): <5

## 2011-05-20 LAB — RAPID STREP SCREEN (MED CTR MEBANE ONLY): Streptococcus, Group A Screen (Direct): NEGATIVE

## 2011-05-23 LAB — URINALYSIS, ROUTINE W REFLEX MICROSCOPIC
Glucose, UA: NEGATIVE
Nitrite: POSITIVE — AB
Protein, ur: 100 — AB
Urobilinogen, UA: 4 — ABNORMAL HIGH

## 2011-05-23 LAB — DIFFERENTIAL
Basophils Relative: 0
Eosinophils Absolute: 0
Lymphocytes Relative: 35
Neutrophils Relative %: 53

## 2011-05-23 LAB — BASIC METABOLIC PANEL
Calcium: 8.5
GFR calc Af Amer: >60
GFR calc non Af Amer: >60
Sodium: 131 — ABNORMAL LOW

## 2011-05-23 LAB — URINE CULTURE

## 2011-05-23 LAB — URINE MICROSCOPIC-ADD ON

## 2011-05-23 LAB — CBC
Hemoglobin: 10.5 — ABNORMAL LOW
RBC: 3.42 — ABNORMAL LOW
RDW: 13.9

## 2011-05-27 LAB — RAPID URINE DRUG SCREEN, HOSP PERFORMED
Benzodiazepines: NOT DETECTED
Cocaine: POSITIVE — AB
Tetrahydrocannabinol: NOT DETECTED

## 2011-05-27 LAB — URINALYSIS, ROUTINE W REFLEX MICROSCOPIC
Nitrite: POSITIVE — AB
Protein, ur: 30 — AB
Urobilinogen, UA: 1

## 2011-05-27 LAB — COMPREHENSIVE METABOLIC PANEL
AST: 15
Albumin: 4.3
Chloride: 101
Creatinine, Ser: 0.77
GFR calc Af Amer: >60
Potassium: 3.5
Total Bilirubin: 1.2
Total Protein: 8

## 2011-05-27 LAB — CBC
Platelets: 317
RDW: 14.4
WBC: 7.9

## 2011-05-27 LAB — DIFFERENTIAL
Basophils Absolute: 0.1
Eosinophils Relative: 0
Lymphocytes Relative: 14
Monocytes Absolute: 0.3
Monocytes Relative: 4

## 2011-05-27 LAB — URINE MICROSCOPIC-ADD ON

## 2011-05-27 LAB — URINE CULTURE: Colony Count: >=100000

## 2011-06-09 LAB — RAPID URINE DRUG SCREEN, HOSP PERFORMED
Amphetamines: NOT DETECTED
Barbiturates: NOT DETECTED
Benzodiazepines: NOT DETECTED
Cocaine: NOT DETECTED
Opiates: POSITIVE — AB
Tetrahydrocannabinol: NOT DETECTED

## 2011-06-09 LAB — HEMOGLOBIN AND HEMATOCRIT, BLOOD
HCT: 33.1 — ABNORMAL LOW
Hemoglobin: 11.1 — ABNORMAL LOW

## 2011-06-09 LAB — BASIC METABOLIC PANEL
Chloride: 105
GFR calc Af Amer: >60
Potassium: 3.4 — ABNORMAL LOW
Sodium: 136

## 2011-06-09 LAB — PREGNANCY, URINE: Preg Test, Ur: NEGATIVE

## 2016-05-14 ENCOUNTER — Emergency Department (HOSPITAL_COMMUNITY)
Admission: EM | Admit: 2016-05-14 | Discharge: 2016-05-14 | Disposition: A | Discharge status: Home / Self Care | Payer: Self-pay | Attending: Emergency Medicine | Admitting: Emergency Medicine

## 2016-05-14 ENCOUNTER — Encounter (HOSPITAL_COMMUNITY): Payer: Self-pay | Admitting: Emergency Medicine

## 2016-05-14 DIAGNOSIS — H5712 Ocular pain, left eye: Secondary | ICD-10-CM | POA: Insufficient documentation

## 2016-05-14 DIAGNOSIS — I10 Essential (primary) hypertension: Secondary | ICD-10-CM | POA: Insufficient documentation

## 2016-05-14 HISTORY — DX: Essential (primary) hypertension: I10

## 2016-05-14 LAB — I-STAT BETA HCG BLOOD, ED (MC, WL, AP ONLY): I-stat hCG, quantitative: <5 m[IU]/mL

## 2016-05-14 LAB — I-STAT CHEM 8, ED
BUN: 19 mg/dL (ref 6–20)
CALCIUM ION: 1.17 mmol/L (ref 1.15–1.40)
CREATININE: 1 mg/dL (ref 0.44–1.00)
Chloride: 101 mmol/L (ref 101–111)
GLUCOSE: 96 mg/dL (ref 65–99)
HCT: 45 % (ref 36.0–46.0)
HEMOGLOBIN: 15.3 g/dL — AB (ref 12.0–15.0)
POTASSIUM: 3.6 mmol/L (ref 3.5–5.1)
Sodium: 140 mmol/L (ref 135–145)
TCO2: 27 mmol/L (ref 0–100)

## 2016-05-14 MED ORDER — LISINOPRIL 10 MG PO TABS
10.0000 mg | ORAL_TABLET | Freq: Once | ORAL | Status: AC
  Administered 2016-05-14: 10 mg via ORAL
  Filled 2016-05-14: qty 1

## 2016-05-14 MED ORDER — FLUORESCEIN SODIUM 1 MG OP STRP
1.0000 | ORAL_STRIP | Freq: Once | OPHTHALMIC | Status: DC
  Filled 2016-05-14: qty 1

## 2016-05-14 MED ORDER — LISINOPRIL 10 MG PO TABS: 10.0000 mg | ORAL_TABLET | Freq: Every day | ORAL | 0 refills | Status: DC

## 2016-05-14 MED ORDER — TETRACAINE HCL 0.5 % OP SOLN
2.0000 [drp] | Freq: Once | OPHTHALMIC | Status: DC
  Filled 2016-05-14: qty 4

## 2016-05-14 MED ORDER — ERYTHROMYCIN 5 MG/GM OP OINT: TOPICAL_OINTMENT | OPHTHALMIC | 0 refills | Status: DC

## 2016-05-14 MED ORDER — ERYTHROMYCIN 5 MG/GM OP OINT
TOPICAL_OINTMENT | Freq: Four times a day (QID) | OPHTHALMIC | Status: DC
  Administered 2016-05-14: 19:00:00 via OPHTHALMIC
  Filled 2016-05-14 (×2): qty 3.5

## 2016-05-14 NOTE — ED Triage Notes (Signed)
Pt sts she woke up with something in her L eye this morning. Can't tell this RN what it was. Pt sts she thinks she got it out but she is worried she scratched her lens. Pt has redness and swelling to eye lid, redness noted to cornea. A&Ox4 and ambulatory. Denies changes to her vision except for intermittent blurriness when messing with her eye.

## 2016-05-14 NOTE — Discharge Instructions (Signed)
Read the information below.  There was not obvious corneal abrasion or foreign body. We were unable to test pressure. You are being given antibiotic ointment for your eye. Please use as directed. Practice good hand hygiene, use a towel specifically for your face, avoid touching your eye. Apply warm compresses to eyelid. Your blood pressure is very high. You are started on lisinopril. It is very important that you schedule an appointment with a primary care doctor for further evaluation. I have provided the contact information and additional resources.  Please call the eye doctor on Monday to schedule a follow up appointment.  Use the prescribed medication as directed.  Please discuss all new medications with your pharmacist.   You may return to the Emergency Department at any time for worsening condition or any new symptoms that concern you. Return to the ED if you develop changes in vision, pain with eye movement, worsening headache, numbness, weakness, chest pain, shortness of breath.

## 2016-05-14 NOTE — ED Provider Notes (Signed)
[place] DEPT Provider Note   CSN: [id number] Arrival date & time: 05/14/16  1635   By signing my name below, I, Dolores Hoose, attest that this documentation has been prepared under the direction and in the presence of non-physician practitioner, Gay Filler, PA-C. Electronically Signed: Dolores Hoose, Scribe. 05/14/2016. 5:36 Pm.  History   Chief Complaint Chief Complaint  Patient presents with  . Eye Pain   The history is provided by the patient. No language interpreter was used.    HPI Comments:  [name] is a 45 y.o. female with PMHx of HTN who presents to the Emergency Department complaining of onset constant left eye pain beginning this morning. Pt reports that she woke up this morning with a foreign body sensation in her eye. She tried for three hours to remove the foreign body, and reports that she feels like she got it out, but is still experiencing eye irritation described as a "gritty sensation." She endorses associated pruritis, watery discharge, eye redness, sensitivity to light, rhinorrhea, mild headache after rhinorrhea started - pt believes secondary to sinus congestion. She has used lubricating eye drops that has relieved some of the redness. despite use of lubricating eye drops. Pt denies chest pain, shortness of breath, abdominal pain, N/V, dizziness, lightheadedness, numbness, weakness, neck pain, fever. She also denies any chemical irritants but reports she did leave her mascara on last night before bed.   Of note in review of vitals patient's blood pressure is elevated. She reports she is noncompliant with her blood pressure medication due to being out of medication. She also endorses smoking prior to arrival.   Past Medical History:  Diagnosis Date  . Hypertension     There are no active problems to display for this patient.   History reviewed. No pertinent surgical history.  OB History    No data available       Home Medications    Prior  to Admission medications   Medication Sig Start Date End Date Taking? Authorizing Provider  erythromycin ophthalmic ointment Place a 1/2 inch ribbon of ointment into the lower eyelid four times daily for 5 days. 05/14/16   Roxanna Mew, PA-C  lisinopril (PRINIVIL,ZESTRIL) 10 MG tablet Take 1 tablet (10 mg total) by mouth daily. 05/14/16   Roxanna Mew, PA-C    Family History No family history on file.  Social History Social History  Substance Use Topics  . Smoking status: Not on file  . Smokeless tobacco: Not on file  . Alcohol use Not on file     Allergies   Penicillins   Review of Systems Review of Systems  Constitutional: Negative for chills, diaphoresis and fever.  HENT: Positive for rhinorrhea. Negative for trouble swallowing.   Eyes: Positive for photophobia, pain, discharge, redness and itching. Negative for visual disturbance.  Respiratory: Negative for shortness of breath.   Cardiovascular: Negative for chest pain.  Gastrointestinal: Negative for abdominal pain, nausea and vomiting.  Genitourinary: Negative for dysuria and hematuria.  Musculoskeletal: Negative for neck pain.  Skin: Negative for rash.  Neurological: Positive for headaches ( mild, after rhinorrhea started, believes secondary to conjestion). Negative for dizziness, syncope, facial asymmetry, weakness, light-headedness and numbness.     Physical Exam Updated Vital Signs BP (!) 226/106   Pulse 72   Temp 98.4 F (36.9 C) (Oral)   Resp 16   SpO2 100%   Physical Exam  Constitutional: She appears well-developed and well-nourished. No distress.  HENT:  Head: Normocephalic  and atraumatic.  Mouth/Throat: Oropharynx is clear and moist. No oropharyngeal exudate.  Eyes: EOM are normal. Pupils are equal, round, and reactive to light. Lids are everted and swept, no foreign bodies found. Right eye exhibits no discharge. Left eye exhibits discharge. Left eye exhibits no chemosis, no exudate and no  hordeolum. No foreign body present in the left eye. Right conjunctiva is not injected. Right conjunctiva has no hemorrhage. Left conjunctiva is injected. Left conjunctiva has no hemorrhage. No scleral icterus.  Pt sitting in room with eyes one. Able to spontaneously open eye. Visual acuity is 20/20 L, 20/20 R uncorrected. Mild eyelid swelling appreciated. No proptosis. Lids are soft. PERRL. EOMs intact without pain. Left conjunctival injection without hemorrhage. Corneal is clear. No ciliary flush. Watery eye discharge. No obvious foreign bodies. Unable to assess IOP secondary to patient cooperation. On fluorescein stain - no corneal foreign bodies, abrasions, or ulcerations.   Neck: Normal range of motion and phonation normal. Neck supple. No neck rigidity. Normal range of motion present.  Cardiovascular: Normal rate, regular rhythm, normal heart sounds and intact distal pulses.   No murmur heard. Pulmonary/Chest: Effort normal and breath sounds normal. No stridor. No respiratory distress. She has no wheezes. She has no rales.  Abdominal: Soft. Bowel sounds are normal. She exhibits no distension. There is no tenderness. There is no rigidity, no rebound, no guarding and no CVA tenderness.  Musculoskeletal: Normal range of motion. She exhibits no edema.  Lymphadenopathy:    She has no cervical adenopathy.  Neurological: She is alert. She is not disoriented. Coordination and gait normal. GCS eye subscore is 4. GCS verbal subscore is 5. GCS motor subscore is 6.  Mental Status:  Alert, thought content appropriate, able to give a coherent history. Speech fluent without evidence of aphasia. Able to follow 2 step commands without difficulty.  Cranial Nerves:  II:  pupils equal, round, reactive to light III,IV, VI: ptosis not present, extra-ocular motions intact bilaterally  V,VII: smile symmetric, facial light touch sensation equal VIII: hearing grossly normal to voice  X: uvula elevates symmetrically    XI: bilateral shoulder shrug symmetric and strong XII: midline tongue extension without fassiculations Motor:  Normal tone. 5/5 in upper and lower extremities bilaterally including strong and equal grip strength and dorsiflexion/plantar flexion Sensory: light touch normal in all extremities. Cerebellar: normal finger-to-nose with bilateral upper extremities Gait: normal gait and balance CV: distal pulses palpable throughout   Skin: Skin is warm and dry. She is not diaphoretic.  Psychiatric: She has a normal mood and affect. Her behavior is normal.  Nursing note and vitals reviewed.    ED Treatments / Results  DIAGNOSTIC STUDIES:  Oxygen Saturation is 100% on RA, normal by my interpretation.    COORDINATION OF CARE:  5:37 PM Discussed treatment plan with pt at bedside which included eye drops and BP medication and pt agreed to plan.  Labs (all labs ordered are listed, but only abnormal results are displayed) Labs Reviewed  I-STAT CHEM 8, ED - Abnormal; Notable for the following:       Result Value   Hemoglobin 15.3 (*)    All other components within normal limits  I-STAT BETA HCG BLOOD, ED ([facility], [facility], [facility] ONLY)    EKG  EKG Interpretation None       Radiology No results found.  Procedures Procedures (including critical care time)  Medications Ordered in ED Medications  lisinopril (PRINIVIL,ZESTRIL) tablet 10 mg (10 mg Oral Given 05/14/16 1839)  Initial Impression / Assessment and Plan / ED Course  I have reviewed the triage vital signs and the nursing notes.  Pertinent labs & imaging results that were available during my care of the patient were reviewed by me and considered in my medical decision making (see chart for details).  Clinical Course    Patient presents to ED with complaint of left eye pain. Patient is afebrile and non-toxic appearing in NAD. Pt is sitting in room with lights on, eyes open. Visual acuity is 20/20 in both eyes uncorrected. No  proptosis. PERRL. EOMs intact without pain. Left conjunctival injection noted without hemorrhage. Cornea clear. No ciliary flush. No corneal abrasions, ulcerations, or dendritic lesions on fluorescein stain. Suspect possible foreign body that pt has since removed with secondary conjunctival irritation. Will place patient on ABX ointment to prevent secondary infection. Discussed symptomatic management. Encouraged follow up with ophthalmologist if sxs persist. Return precautions given.   Of note, pt has markedly elevated blood pressure. Pt has h/o hypertension. She is not currently taking her blood pressure medication due to being out of refills. She also is a everyday tobacco user and endorsed smoking prior to coming to the ED. She denies chest pain, shortness of breath, abdominal pain, N/V, dysuria, hematuria, numbness, weakness, dizziness, lightheadedness, facial droop, slurred speech. Normal neurologic, cardiac, pulmonary, and abdominal exam. I-stat chem 8 re-assuring. Discussed with pt risk of untreated hypertension to include risk of stroke, heart disease, and kidney disease. Pt is eager to leave as well - states boyfriend locked self out of house and dog has been stuck in house all day, she lives an hour away she states. Pt restarted on lisinopril, dose given in ED. Rx lisinopril. Pt encouraged to follow up with a PCP, [place] contact information provided. Strict return precautions given. Pt voiced understanding and is agreeable.     Final Clinical Impressions(s) / ED Diagnoses   Final diagnoses:  Eye pain, left  Essential hypertension    New Prescriptions Discharge Medication List as of 05/14/2016  6:40 PM    START taking these medications   Details  lisinopril (PRINIVIL,ZESTRIL) 10 MG tablet Take 1 tablet (10 mg total) by mouth daily., Starting Sat 05/14/2016, Print      I personally performed the services described in this documentation, which was scribed  in my presence. The recorded information has been reviewed and is accurate.     Roxanna Mew, PA-C 05/15/16 [address] [place], [place] 05/15/16 1255    Quintella Reichert, MD 05/17/16 [phone]

## 2016-05-15 NOTE — ED Provider Notes (Signed)
Called patient this morning to check on her. Patient endorses feeling significantly better following treatment. I encouraged pt to return to ED in the next 24 hours for blood pressure recheck. Home dose of lisinopril is 67m, [facility] 2063[facility]. Again re-iterated risks of uncontrolled BP and importance of tobacco cessation. Return precautions given. Pt voiced understanding and is agreeable.    AshRoxanna MewA-C 05/15/16 [id number] 05/17/16 [phone]

## 2019-05-13 ENCOUNTER — Emergency Department (HOSPITAL_BASED_OUTPATIENT_CLINIC_OR_DEPARTMENT_OTHER)
Admission: EM | Admit: 2019-05-13 | Discharge: 2019-05-13 | Disposition: A | Discharge status: Home / Self Care | Payer: Self-pay | Attending: Emergency Medicine | Admitting: Emergency Medicine

## 2019-05-13 ENCOUNTER — Emergency Department (HOSPITAL_BASED_OUTPATIENT_CLINIC_OR_DEPARTMENT_OTHER): Payer: Self-pay

## 2019-05-13 ENCOUNTER — Encounter (HOSPITAL_BASED_OUTPATIENT_CLINIC_OR_DEPARTMENT_OTHER): Payer: Self-pay | Admitting: *Deleted

## 2019-05-13 ENCOUNTER — Other Ambulatory Visit: Payer: Self-pay

## 2019-05-13 DIAGNOSIS — M25571 Pain in right ankle and joints of right foot: Secondary | ICD-10-CM | POA: Insufficient documentation

## 2019-05-13 DIAGNOSIS — I10 Essential (primary) hypertension: Secondary | ICD-10-CM | POA: Insufficient documentation

## 2019-05-13 DIAGNOSIS — F17210 Nicotine dependence, cigarettes, uncomplicated: Secondary | ICD-10-CM | POA: Insufficient documentation

## 2019-05-13 DIAGNOSIS — Z880 Allergy status to penicillin: Secondary | ICD-10-CM | POA: Insufficient documentation

## 2019-05-13 HISTORY — DX: Calculus of kidney: N20.0

## 2019-05-13 MED ORDER — HYDROCODONE-ACETAMINOPHEN 5-325 MG PO TABS: 1.0000 | ORAL_TABLET | ORAL | 0 refills | Status: DC | PRN

## 2019-05-13 MED FILL — HYDROCODON-APAP 5-325: 5-325 | 3 days supply | Qty: 10 | Fill #0

## 2019-05-13 NOTE — ED Triage Notes (Signed)
Right foot and ankle pain x 4 days. No known injury. She is wearing an air-cast that she was given for the same pain 2 years ago.

## 2019-05-13 NOTE — ED Notes (Signed)
ED Provider at bedside. 

## 2019-05-13 NOTE — ED Provider Notes (Signed)
[facility] EMERGENCY DEPARTMENT Provider Note   CSN: [id number] Arrival date & time: 05/13/19  1236     History   Chief Complaint Chief Complaint  Patient presents with  . Foot Pain    HPI [name] is a 48 y.o. female.     The history is provided by the patient and medical records. No language interpreter was used.  Ankle Pain Location:  Ankle Time since incident:  4 days Injury: no   Ankle location:  R ankle Pain details:    Quality:  Aching and cramping   Radiates to:  Does not radiate   Severity:  Severe   Onset quality:  Gradual   Timing:  Constant   Progression:  Unchanged Chronicity:  Recurrent Dislocation: no   Tetanus status:  Unknown Prior injury to area:  Yes Relieved by:  Nothing Worsened by:  Bearing weight Ineffective treatments:  Immobilization Associated symptoms: swelling   Associated symptoms: no back pain, no fatigue, no fever, no itching, no muscle weakness, no neck pain, no numbness and no tingling     Past Medical History:  Diagnosis Date  . Hypertension   . Kidney stones     There are no active problems to display for this patient.   Past Surgical History:  Procedure Laterality Date  . LEEP    . TUBAL LIGATION       OB History   No obstetric history on file.      Home Medications    Prior to Admission medications   Medication Sig Start Date End Date Taking? Authorizing Provider  Buprenorphine HCl-Naloxone HCl (SUBOXONE SL) Place under the tongue.   Yes [provider]  CLONIDINE HCL PO Take by mouth.   Yes [provider]  GABAPENTIN PO Take by mouth.   Yes [provider]  erythromycin ophthalmic ointment Place a 1/2 inch ribbon of ointment into the lower eyelid four times daily for 5 days. 05/14/16   Frederica Kuster, PA-C    Family History No family history on file.  Social History Social History   Tobacco Use  . Smoking status: Current Every Day Smoker  . Smokeless  tobacco: Never Used  Substance Use Topics  . Alcohol use: Yes  . Drug use: Not Currently     Allergies   Penicillins   Review of Systems Review of Systems  Constitutional: Negative for chills, diaphoresis, fatigue and fever.  HENT: Negative for congestion.   Respiratory: Negative for chest tightness and shortness of breath.   Cardiovascular: Negative for chest pain and palpitations.  Gastrointestinal: Negative for abdominal pain.  Genitourinary: Negative for flank pain.  Musculoskeletal: Negative for back pain and neck pain.  Skin: Negative for itching.  Neurological: Negative for weakness, numbness and headaches.  Psychiatric/Behavioral: Negative for agitation.  All other systems reviewed and are negative.    Physical Exam Updated Vital Signs BP (!) 165/106 (BP Location: Left Arm)   Pulse 76   Temp 98.9 F (37.2 C) (Oral)   Resp 18   Ht 5' 7"  (1.702 m)   Wt 59 kg   SpO2 100%   BMI 20.36 kg/m   Physical Exam Vitals signs and nursing note reviewed.  Constitutional:      General: She is not in acute distress.    Appearance: She is well-developed. She is not ill-appearing, toxic-appearing or diaphoretic.  HENT:     Head: Normocephalic and atraumatic.     Right Ear: External ear normal.  Left Ear: External ear normal.     Nose: Nose normal. No congestion or rhinorrhea.     Mouth/Throat:     Pharynx: No oropharyngeal exudate.  Eyes:     Conjunctiva/sclera: Conjunctivae normal.     Pupils: Pupils are equal, round, and reactive to light.  Neck:     Musculoskeletal: Normal range of motion and neck supple. No muscular tenderness.  Cardiovascular:     Rate and Rhythm: Normal rate.     Pulses: Normal pulses.  Pulmonary:     Effort: No respiratory distress.     Breath sounds: No stridor. No wheezing, rhonchi or rales.  Chest:     Chest wall: No tenderness.  Abdominal:     General: There is no distension.     Tenderness: There is no abdominal tenderness. There  is no right CVA tenderness, left CVA tenderness or rebound.  Musculoskeletal:        General: Swelling and tenderness present.     Right lower leg: No edema.     Left lower leg: No edema.  Skin:    General: Skin is warm.     Capillary Refill: Capillary refill takes less than 2 seconds.     Findings: No erythema or rash.  Neurological:     General: No focal deficit present.     Mental Status: She is alert and oriented to person, place, and time.     Sensory: No sensory deficit.     Motor: No weakness or abnormal muscle tone.     Deep Tendon Reflexes: Reflexes are normal and symmetric.  Psychiatric:        Mood and Affect: Mood normal.      ED Treatments / Results  Labs (all labs ordered are listed, but only abnormal results are displayed) Labs Reviewed - No data to display  EKG None  Radiology Dg Ankle Complete Right  Result Date: 05/13/2019 CLINICAL DATA:  Right ankle pain. EXAM: RIGHT ANKLE - COMPLETE 3+ VIEW COMPARISON:  No recent. FINDINGS: Mild diffuse soft tissue swelling cannot be excluded. No acute bony or joint abnormality. IMPRESSION: Mild diffuse soft tissue swelling cannot be excluded. No acute bony abnormality identified. Electronically Signed   By: Marcello Moores  Register   On: 05/13/2019 13:34    Procedures Procedures (including critical care time)  Medications Ordered in ED Medications - No data to display   Initial Impression / Assessment and Plan / ED Course  I have reviewed the triage vital signs and the nursing notes.  Pertinent labs & imaging results that were available during my care of the patient were reviewed by me and considered in my medical decision making (see chart for details).        [name] is a 48 y.o. female last medical history significant for prior ankle injuries who presents with recurrent right ankle pain.  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  She says that the ankle pain feels similar to in the past when she is irritated and injured it.  She reports no specific injury or fall but has been trying to use an old ankle brace to help support her ankle.  She reports it still hurting and taking old gabapentin has not helped.  She has not taken other anti-inflammatory medication.  She denies fevers, chills, erythema, or redness.  She denies history of DVT or PE.  She denies any numbness, tingling, or weakness of the extremity.  She denies other complaints.  No recent rashes or lacerations.  On exam, patient has tenderness in her right ankle.  She is able to ambulate.  Normal pulse and sensation in the foot.  No knee tenderness or skin injury seen.  Mild edema seen.  No calf tenderness or leg swelling otherwise.  Lungs clear and chest is nontender.  Exam otherwise reassuring.  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  She may have injured in the past and reaggravated the injury.  The brace she arrived with was not supporting her ankle as well as could be, we will provide a walking boot for her.  She already has crutches.  We will give a short course of pain medication and have her follow-up with sports medicine.  Based on her history and exam, extremely low suspicion for gout, septic arthritis, or DVT as cause of symptoms.  Patient understands return precautions if any symptoms change or worsen to return.  She no other questions or concerns and was discharged in good condition.    Final Clinical Impressions(s) / ED Diagnoses   Final diagnoses:  Acute right ankle pain    ED Discharge Orders         Ordered    HYDROcodone-acetaminophen (NORCO/VICODIN) 5-325 MG tablet  Every 4 hours PRN     05/13/19 1522          Clinical Impression: 1. Acute right ankle pain     Disposition: Discharge  Condition: Good  I have discussed the results, Dx and Tx plan with the pt(& family if present).  He/she/they expressed understanding and agree(s) with the plan. Discharge instructions discussed at great length. Strict return precautions discussed and pt &/or family have verbalized understanding of the instructions. No further questions at time of discharge.    New Prescriptions   HYDROCODONE-ACETAMINOPHEN (NORCO/VICODIN) 5-325 MG TABLET    Take 1 tablet by mouth every 4 (four) hours as needed.    Follow Up: Rosemarie Ax, MD [address] [place] [address] [phone]        Tegeler, Gwenyth Allegra, MD 05/14/19 [phone]

## 2019-05-13 NOTE — Discharge Instructions (Signed)
Your history and exam today are overall reassuring.  We do not suspect there is infection, gouty inflammation, or blood clot cause of her symptoms based on your history and exam.  Your x-ray did not show any fracture or dislocation.  Please use the new ankle support boot and crutches for the next few days and please take anti-inflammatory medications.  He may also use the pain medication provided to help.  Please follow-up with the sports medicine team for further management.

## 2021-01-08 ENCOUNTER — Encounter (HOSPITAL_BASED_OUTPATIENT_CLINIC_OR_DEPARTMENT_OTHER): Payer: Self-pay

## 2021-01-08 ENCOUNTER — Other Ambulatory Visit: Payer: Self-pay

## 2021-01-08 ENCOUNTER — Other Ambulatory Visit (HOSPITAL_BASED_OUTPATIENT_CLINIC_OR_DEPARTMENT_OTHER): Payer: Self-pay

## 2021-01-08 ENCOUNTER — Emergency Department (HOSPITAL_BASED_OUTPATIENT_CLINIC_OR_DEPARTMENT_OTHER): Payer: Self-pay

## 2021-01-08 ENCOUNTER — Emergency Department (HOSPITAL_BASED_OUTPATIENT_CLINIC_OR_DEPARTMENT_OTHER)
Admission: EM | Admit: 2021-01-08 | Discharge: 2021-01-08 | Disposition: A | Discharge status: Home / Self Care | Payer: Self-pay | Attending: Emergency Medicine | Admitting: Emergency Medicine

## 2021-01-08 DIAGNOSIS — G8929 Other chronic pain: Secondary | ICD-10-CM | POA: Insufficient documentation

## 2021-01-08 DIAGNOSIS — M25571 Pain in right ankle and joints of right foot: Secondary | ICD-10-CM | POA: Insufficient documentation

## 2021-01-08 DIAGNOSIS — Z79899 Other long term (current) drug therapy: Secondary | ICD-10-CM | POA: Insufficient documentation

## 2021-01-08 DIAGNOSIS — F17210 Nicotine dependence, cigarettes, uncomplicated: Secondary | ICD-10-CM | POA: Insufficient documentation

## 2021-01-08 DIAGNOSIS — Z760 Encounter for issue of repeat prescription: Secondary | ICD-10-CM | POA: Insufficient documentation

## 2021-01-08 DIAGNOSIS — M79671 Pain in right foot: Secondary | ICD-10-CM | POA: Insufficient documentation

## 2021-01-08 DIAGNOSIS — I10 Essential (primary) hypertension: Secondary | ICD-10-CM | POA: Insufficient documentation

## 2021-01-08 DIAGNOSIS — M25572 Pain in left ankle and joints of left foot: Secondary | ICD-10-CM | POA: Insufficient documentation

## 2021-01-08 DIAGNOSIS — M79672 Pain in left foot: Secondary | ICD-10-CM | POA: Insufficient documentation

## 2021-01-08 MED ORDER — HYDRALAZINE HCL 50 MG PO TABS
25.0000 mg | ORAL_TABLET | Freq: Once | ORAL | 2 refills | Status: DC | PRN
Start: 2021-01-08 — End: 2021-04-14
  Filled 2021-01-08: qty 30, 60d supply, fill #0

## 2021-01-08 MED ORDER — LISINOPRIL 10 MG PO TABS
10.0000 mg | ORAL_TABLET | Freq: Every day | ORAL | 0 refills | Status: DC
  Filled 2021-01-08: qty 30, 30d supply, fill #0

## 2021-01-08 MED ORDER — LISINOPRIL 10 MG PO TABS
10.0000 mg | ORAL_TABLET | Freq: Once | ORAL | Status: AC
  Administered 2021-01-08: 10 mg via ORAL
  Filled 2021-01-08: qty 1

## 2021-01-08 NOTE — ED Notes (Signed)
Pt states has no PCP states she called  [facility] and she could not get in till august , states she has run out of her bp meds and need rx for that also

## 2021-01-08 NOTE — ED Triage Notes (Signed)
Pt c/o pain to left ankle x "years"-worse x 3 days-denies injury-limping gait-NAD

## 2021-01-08 NOTE — Discharge Instructions (Addendum)
  You were evaluated in the Emergency Department and after careful evaluation, we did not find any emergent condition requiring admission or further testing in the hospital.   Your exam/testing today was overall reassuring.  I refilled your lisinopril and also started you on hydralazine, its very important that you take your blood pressure medication every day and follow-up with your primary care to discuss your blood pressure.  Please use the attached instructions for exercise and diet to help your blood pressure.  In regards to your feet and ankle pain on both sides need to follow-up with a podiatrist, please schedule appointment with the podiatrist attached.  If you have any new or worsening concerning symptoms he is back to the ER.  Please start taking ibuprofen once in the morning and once at night for your pain. Please return to the Emergency Department if you experience any worsening of your condition.  Thank you for allowing [place] to be a part of your care. Please speak to your pharmacist about any new medications prescribed today in regards to side effects or interactions with other medications.

## 2021-01-08 NOTE — ED Provider Notes (Signed)
[facility] EMERGENCY DEPARTMENT Provider Note   CSN: [id number] Arrival date & time: 01/08/21  1118     History Chief Complaint  Patient presents with  . Ankle Pain    [name] is a 50 y.o. female w/ past medical history of hypertension the presents emerged department today for refill of her hypertensive medications in addition to bilateral ankle and feet pain.  Patient states that she has not  been taking her hypertensive medications over a year due to monetary concerns, states that she is ready to start taking them again.  Denies any symptoms from her noncompliance.  No chest pain, shortness of breath, headache, numbness or tingling, gait disturbance, weakness.  Patient states that her feet and ankles have been hurting her for about 8 years, will let flareup for about 3 days, and then go away on its own.  Patient states that it started flaring up 3 days ago, and wanted to see what was going on.  States that it has been progressively getting a lot better to the point where she has not had to use a cane.  Had to call out of work yesterday due to the pain.  States that the pain will often switch between her right and her left ankle/foot.  States that she noticed that they swell and then the pain will begin.  Does not radiate anywhere.  Denies any numbness or tingling into her feet.  Denies any redness of her hands or her feet.  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  Denies pain or swelling elsewhere or in other areas.  Denies any fevers or chills.  States that she took  800 mg ibuprofen yesterday which did help.  No other complaints at this time.  HPI     Past Medical History:  Diagnosis Date  . Hypertension   . Kidney stones     There are no problems to display for this patient.   Past Surgical History:  Procedure Laterality Date  . LEEP    . TUBAL LIGATION       OB History   No obstetric history on file.     No  family history on file.  Social History   Tobacco Use  . Smoking status: Current Every Day Smoker    Types: Cigarettes  . Smokeless tobacco: Never Used  Substance Use Topics  . Alcohol use: Yes    Comment: weekly  . Drug use: Not Currently    Home Medications Prior to Admission medications   Medication Sig Start Date End Date Taking? Authorizing Provider  hydrALAZINE (APRESOLINE) 50 MG tablet Take 1/2 tablet (25 mg total) by mouth once a day as needed. 01/08/21 03/09/21 Yes Jeneal Vogl, PA-C  lisinopril (ZESTRIL) 10 MG tablet Take 1 tablet (10 mg total) by mouth daily. 01/08/21 02/07/21 Yes Maxon Kresse, Deberah Pelton, PA-C  Buprenorphine HCl-Naloxone HCl (SUBOXONE SL) Place under the tongue.    [provider]  CLONIDINE HCL PO Take by mouth.    [provider]  erythromycin ophthalmic ointment Place a 1/2 inch ribbon of ointment into the lower eyelid four times daily for 5 days. 05/14/16   Frederica Kuster, PA-C  GABAPENTIN PO Take by mouth.    [provider]  HYDROcodone-acetaminophen (NORCO/VICODIN) 5-325 MG tablet Take 1 tablet by mouth every 4 (four) hours as needed. 05/13/19   Tegeler, Gwenyth Allegra, MD    Allergies    Penicillins  Review of Systems   Review of Systems  Constitutional: Negative for diaphoresis, fatigue and fever.  Eyes: Negative for visual disturbance.  Respiratory: Negative for shortness of breath.   Cardiovascular: Negative for chest pain.  Gastrointestinal: Negative for nausea and vomiting.  Musculoskeletal: Positive for arthralgias. Negative for back pain and myalgias.  Skin: Negative for color change, pallor, rash and wound.  Neurological: Negative for syncope, weakness, light-headedness, numbness and headaches.  Psychiatric/Behavioral: Negative for behavioral problems and confusion.    Physical Exam Updated Vital Signs BP (!) 183/109 (BP Location: Left Arm)   Pulse 71   Temp 98.9 F (37.2 C) (Oral)   Resp (!) 100   Ht 5' 7"   (1.702 m)   Wt 54.4 kg   SpO2 99%   BMI 18.79 kg/m   Physical Exam Constitutional:      General: She is not in acute distress.    Appearance: Normal appearance. She is not ill-appearing, toxic-appearing or diaphoretic.  HENT:     Head: Normocephalic and atraumatic.     Mouth/Throat:     Mouth: Mucous membranes are moist.     Pharynx: No oropharyngeal exudate or posterior oropharyngeal erythema.  Eyes:     Extraocular Movements: Extraocular movements intact.     Pupils: Pupils are equal, round, and reactive to light.  Cardiovascular:     Rate and Rhythm: Normal rate and regular rhythm.     Pulses: Normal pulses.  Pulmonary:     Effort: Pulmonary effort is normal. No respiratory distress.     Breath sounds: Normal breath sounds.  Chest:     Chest wall: No tenderness.  Abdominal:     General: Abdomen is flat. There is no distension.  Musculoskeletal:        General: Normal range of motion.     Cervical back: Normal range of motion. No rigidity or tenderness.       Feet:  Feet:     Comments: Right foot: Patient with bony protrusion to lateral midfoot, no erythema or warmth.  No tenderness to palpation elsewhere besides protrusion.  No tenderness to malleoli.  Normal range of motion to ankle and toes.  DP 2+.  Left foot: Patient with tenderness to midfoot.  No edema.  Limited range of motion to ankle due to pain and midfoot, no erythema or swelling of ankle.  Normal range of motion to toes.  DP 2+.  Gait slightly antalgic, favoring right side. Skin:    General: Skin is warm and dry.     Capillary Refill: Capillary refill takes less than 2 seconds.  Neurological:     General: No focal deficit present.     Mental Status: She is alert and oriented to person, place, and time.     Cranial Nerves: No cranial nerve deficit.     Sensory: No sensory deficit.     Motor: No weakness.     Coordination: Coordination normal.     Gait: Gait normal.  Psychiatric:        Mood and Affect:  Mood normal.        Behavior: Behavior normal.        Thought Content: Thought content normal.     ED Results / Procedures / Treatments   Labs (all labs ordered are listed, but only abnormal results are displayed) Labs Reviewed - No data to display  EKG None  Radiology DG Ankle Complete Left  Result Date: 01/08/2021 CLINICAL DATA:  Foot pain. Pain the LEFT ankle and  foot for last 3 days, no history of injury. EXAM: LEFT FOOT - COMPLETE 3+ VIEW; LEFT ANKLE COMPLETE - 3+ VIEW COMPARISON:  Contralateral foot of the same date. FINDINGS: LEFT foot: Mild hallux valgus. Mild midfoot degenerative changes. 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 Degenerative changes about the talar calcaneal joint posteriorly with prominent posterior process of the talus and os trigonum. No acute findings. LEFT ankle: Midfoot degenerative changes. Lucency in the calcaneus as discussed above. Prominent degenerative change about the posterior process of the talus. No sign of acute fracture. No sign of dislocation. IMPRESSION: 1. Mild hallux valgus. 2. Mild midfoot degenerative changes. 3. 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 4. No acute findings. Electronically Signed   By: Zetta Bills M.D.   On: 01/08/2021 13:49   DG Ankle Complete Right  Result Date: 01/08/2021 CLINICAL DATA:  Right foot and ankle pain EXAM: RIGHT ANKLE - COMPLETE 3+ VIEW; RIGHT FOOT COMPLETE - 3+ VIEW COMPARISON:  05/13/2019 FINDINGS: No acute fracture or dislocation of the right foot or ankle. Possible early marginal erosive changes along the dorsomedial aspect of the first metatarsal head. Joint spaces are preserved. Mild soft tissue prominence overlies the first MTP joint medially. No focal soft tissue swelling about the ankle. IMPRESSION: 1. No acute osseous abnormality of the right foot or ankle. 2.  Possible early marginal erosive changes along the dorsomedial aspect of the first metatarsal head, which could reflect an underlying crystalline arthropathy such as gout. Electronically Signed   By: Davina Poke D.O.   On: 01/08/2021 13:49   DG Foot Complete Left  Result Date: 01/08/2021 CLINICAL DATA:  Foot pain. Pain the LEFT ankle and foot for last 3 days, no history of injury. EXAM: LEFT FOOT - COMPLETE 3+ VIEW; LEFT ANKLE COMPLETE - 3+ VIEW COMPARISON:  Contralateral foot of the same date. FINDINGS: LEFT foot: Mild hallux valgus. Mild midfoot degenerative changes. 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 Degenerative changes about the talar calcaneal joint posteriorly with prominent posterior process of the talus and os trigonum. No acute findings. LEFT ankle: Midfoot degenerative changes. Lucency in the calcaneus as discussed above. Prominent degenerative change about the posterior process of the talus. No sign of acute fracture. No sign of dislocation. IMPRESSION: 1. Mild hallux valgus. 2. Mild midfoot degenerative changes. 3. 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 4. No acute findings. Electronically Signed   By: Zetta Bills M.D.   On: 01/08/2021 13:49   DG Foot Complete Right  Result Date: 01/08/2021 CLINICAL DATA:  Right foot and ankle pain EXAM: RIGHT ANKLE - COMPLETE 3+ VIEW; RIGHT FOOT COMPLETE - 3+ VIEW COMPARISON:  05/13/2019 FINDINGS: No acute fracture or dislocation of the right foot or ankle. Possible early marginal erosive changes along the dorsomedial aspect of the first metatarsal head. Joint spaces are preserved. Mild soft tissue prominence overlies the first MTP joint medially. No focal soft tissue swelling about the ankle. IMPRESSION: 1. No acute osseous abnormality of the right foot or ankle. 2. Possible early marginal erosive changes  along the dorsomedial aspect of the first metatarsal head, which could reflect an underlying crystalline arthropathy such as gout. Electronically Signed   By: Davina Poke D.O.   On: 01/08/2021 13:49    Procedures Procedures  Medications Ordered in ED Medications  lisinopril (ZESTRIL) tablet 10 mg (10 mg Oral Given 01/08/21 1323)    ED Course  I have reviewed the triage vital signs and the nursing notes.  Pertinent labs & imaging results that were available during my care of the patient were reviewed by me and considered in my medical decision making (see chart for details).    MDM Rules/Calculators/A&P                         50 D Amorin is a 50 y.o. female w/ past medical history of hypertension the presents emerged department today for refill of her hypertensive medications in addition to bilateral ankle and feet pain.    Patient states that she was taking lisinopril prior to this, did not have any side effects from this.  Unsure about the dosing.  Patient states that her blood pressure normally runs in the 749S systolic.  Patient denies headache, change in vision, numbness, weakness, chest pain, dyspnea, dizziness, or lightheadedness therefore doubt hypertensive emergency. . Discussed return precaution signs/symptoms for hypertensive emergency as listed above with the patient.  Patient blood pressure after lisinopril systolic 496/759.  Do not think that we need to aggressively lower this since patient probably states that the blood pressure lives in this area.  Patient is asymptomatic from this.  In regards to ankle and feet plain, plain films do not show any acute abnormality, does show incidental findings discussed with patient, patient will follow up with podiatry.  No concerns for gout or septic arthritis, area is not red or warm, normal range of motion to ankles and feet.  No trauma to this area. Patient is distally neurovascularly intact. Patient is able to ambulate,  symptomatic treatment discussed.  This also could be due to patient constantly being on her feet for most of the day.  Did discuss this with patient.  Doubt need for further emergent work up at this time. I explained the diagnosis and have given explicit precautions to return to the ER including for any other new or worsening symptoms. The patient understands and accepts the medical plan as it's been dictated and I have answered their questions. Discharge instructions concerning home care and prescriptions have been given. The patient is STABLE and is discharged to home in good condition.  Final Clinical Impression(s) / ED Diagnoses Final diagnoses:  Chronic pain of both ankles  Medication refill  Primary hypertension    Rx / DC Orders ED Discharge Orders         Ordered    lisinopril (ZESTRIL) 10 MG tablet  Daily        01/08/21 1511    hydrALAZINE (APRESOLINE) 50 MG tablet  Once PRN        01/08/21 1511           Alfredia Client, PA-C 01/08/21 1526    Isla Pence, MD 01/09/21 1138

## 2021-03-24 ENCOUNTER — Encounter (INDEPENDENT_AMBULATORY_CARE_PROVIDER_SITE_OTHER): Payer: Self-pay | Admitting: Primary Care

## 2021-03-24 ENCOUNTER — Ambulatory Visit (INDEPENDENT_AMBULATORY_CARE_PROVIDER_SITE_OTHER): Payer: Medicaid Other | Admitting: Primary Care

## 2021-03-24 ENCOUNTER — Other Ambulatory Visit: Payer: Self-pay

## 2021-03-24 VITALS — BP 162/114 | HR 91 | Temp 97.5°F | Ht 67.0 in | Wt 114.2 lb

## 2021-03-24 DIAGNOSIS — R197 Diarrhea, unspecified: Secondary | ICD-10-CM

## 2021-03-24 DIAGNOSIS — R112 Nausea with vomiting, unspecified: Secondary | ICD-10-CM | POA: Diagnosis not present

## 2021-03-24 DIAGNOSIS — Z7689 Persons encountering health services in other specified circumstances: Secondary | ICD-10-CM

## 2021-03-24 DIAGNOSIS — F1010 Alcohol abuse, uncomplicated: Secondary | ICD-10-CM

## 2021-03-24 DIAGNOSIS — I10 Essential (primary) hypertension: Secondary | ICD-10-CM

## 2021-03-24 DIAGNOSIS — Z720 Tobacco use: Secondary | ICD-10-CM

## 2021-03-24 DIAGNOSIS — F17210 Nicotine dependence, cigarettes, uncomplicated: Secondary | ICD-10-CM

## 2021-03-24 DIAGNOSIS — R8290 Unspecified abnormal findings in urine: Secondary | ICD-10-CM | POA: Diagnosis not present

## 2021-03-24 DIAGNOSIS — R5382 Chronic fatigue, unspecified: Secondary | ICD-10-CM

## 2021-03-24 DIAGNOSIS — F4323 Adjustment disorder with mixed anxiety and depressed mood: Secondary | ICD-10-CM

## 2021-03-24 LAB — POCT URINALYSIS DIP (CLINITEK)
Glucose, UA: NEGATIVE mg/dL
Leukocytes, UA: NEGATIVE
Nitrite, UA: NEGATIVE
POC PROTEIN,UA: 100 — AB
Spec Grav, UA: 1.025 (ref 1.010–1.025)
Urobilinogen, UA: 2 E.U./dL — AB
pH, UA: 5.5 (ref 5.0–8.0)

## 2021-03-24 MED ORDER — CLONIDINE 0.2 MG/24HR TD PTWK
0.2000 mg | MEDICATED_PATCH | TRANSDERMAL | 12 refills | Status: DC
  Filled 2021-03-24: qty 4, 28d supply, fill #0

## 2021-03-24 MED ORDER — ONDANSETRON 4 MG PO TBDP
4.0000 mg | ORAL_TABLET | Freq: Three times a day (TID) | ORAL | 0 refills | Status: DC | PRN
  Filled 2021-03-24: qty 20, 7d supply, fill #0

## 2021-03-24 NOTE — Progress Notes (Signed)
New Patient Office Visit  Subjective:  Patient ID: [name], female    DOB: 04-19-1971  Age: 50 y.o. MRN: [id number]  CC:  Chief Complaint  Patient presents with   New Patient (Initial Visit)    HPI [name] is a 50 year old thin frameme female with BMI of 17.89 presents for establishment of care.  She has voices several concerns first unable to take blood pressure medication due to nausea and vomiting anything she tries to eat or drink she also has malodorous, mucous diarrhea and no appetite weight loss present. Past Medical History:  Diagnosis Date   Hypertension    Kidney stones     Past Surgical History:  Procedure Laterality Date   LEEP     TUBAL LIGATION      History reviewed. No pertinent family history.  Social History   Socioeconomic History   Marital status: Divorced    Spouse name: Not on file   Number of children: Not on file   Years of education: Not on file   Highest education level: Not on file  Occupational History   Not on file  Tobacco Use   Smoking status: Every Day    Types: Cigarettes   Smokeless tobacco: Never  Substance and Sexual Activity   Alcohol use: Yes    Comment: weekly   Drug use: Not Currently   Sexual activity: Not on file  Other Topics Concern   Not on file  Social History Narrative   Not on file   Social Determinants of Health   Financial Resource Strain: Not on file  Food Insecurity: Not on file  Transportation Needs: Not on file  Physical Activity: Not on file  Stress: Not on file  Social Connections: Not on file  Intimate Partner Violence: Not on file    ROS Review of Systems  Constitutional:  Positive for activity change and fatigue.  HENT:  Positive for postnasal drip.   Respiratory:  Positive for cough.        Cough  Gastrointestinal:  Positive for abdominal pain, diarrhea and nausea.  All other systems reviewed and are negative.  Objective:   Today's Vitals: BP (!) 162/114 (BP Location:  Right Arm, Patient Position: Sitting, Cuff Size: Normal)   Pulse 91   Temp (!) 97.5 F (36.4 C) (Temporal)   Ht _0  (1.702 m)   Wt 114 lb 3.2 oz (51.8 kg)   SpO2 99%   BMI 17.89 kg/m   Physical Exam General: Vital signs reviewed.  Patient is well-developed and malnourished, in no acute distress and cooperative with exam.  Head: Normocephalic and atraumatic. Eyes: EOMI, conjunctivae normal, no scleral icterus.  Neck: Supple, trachea midline, normal ROM, no JVD, masses, thyromegaly, or carotid bruit present.  Cardiovascular: RRR, S1 normal, S2 normal, no murmurs, gallops, or rubs. Pulmonary/Chest: Clear to auscultation bilaterally, no wheezes, rales, or rhonchi. Abdominal: Soft, non-tender, non-distended, BS +, no masses, organomegaly, or guarding present.  Musculoskeletal: No joint deformities, erythema, or stiffness, ROM full and nontender. Extremities: No lower extremity edema bilaterally,  pulses symmetric and intact bilaterally. No cyanosis or clubbing. Neurological: A&O x3, Strength is normal and symmetric bilaterally Skin: Warm, dry and intact. No rashes or erythema. Psychiatric: Normal mood and affect. speech and behavior is normal. Cognition and memory are normal.     Assessment & Plan:  Fleur was seen today for new patient (initial visit).  Diagnoses and all orders for this visit:  Encounter to establish care  Diarrhea of presumed infectious origin -     CMP14+EGFR -     Cdiff NAA+O+P+Stool Culture  Nausea and vomiting, intractability of vomiting not specified, unspecified vomiting type -     CMP14+EGFR Prescribed Zofran   Essential hypertension Counseled on blood pressure goal of less than 130/80, low-sodium, DASH diet, medication compliance, 150 minutes of moderate intensity exercise per week. Discussed medication compliance, adverse effects.  -     cloNIDine (CATAPRES - DOSED IN MG/24 HR) 0.2 mg/24hr patch; Place 1 patch (0.2 mg total) onto the skin once a  week.  Chronic fatigue -     CBC with Differential -     TSH + free T4  Alcohol abuse/Tobacco abuse Continues to drink and tobacco abuse   - I have recommended complete cessation of tobacco use and alcohol. (Recommend alcohol cessation in a facility )  I have discussed various options available for assistance with tobacco cessation including over the counter methods (Nicotine gum, patch and lozenges). We also discussed prescription options (Chantix, Nicotine Inhaler / Nasal Spray). The patient is not interested in pursuing any prescription tobacco cessation options at this time. - Patient declines at this time.   Check blood work  -     Folate -     Vitamin B12  Malodorous urine -     POCT URINALYSIS DIP (CLINITEK) Bilirubin in urine can be an early sign of liver damage. Explain can be contributed to alcoholism  Adjustment disorder with mixed anxiety and depressed mood Flowsheet Row Office Visit from 03/24/2021 in [facility]  PHQ-9 Total Score 20      Referred to CSW  Outpatient Encounter Medications as of 03/24/2021  Medication Sig   Buprenorphine HCl-Naloxone HCl (SUBOXONE SL) Place under the tongue.   cloNIDine (CATAPRES - DOSED IN MG/24 HR) 0.2 mg/24hr patch Place 1 patch (0.2 mg total) onto the skin once a week.   erythromycin ophthalmic ointment Place a 1/2 inch ribbon of ointment into the lower eyelid four times daily for 5 days.   HYDROcodone-acetaminophen (NORCO/VICODIN) 5-325 MG tablet Take 1 tablet by mouth every 4 (four) hours as needed.   ondansetron (ZOFRAN ODT) 4 MG disintegrating tablet Take 1 tablet (4 mg total) by mouth every 8 (eight) hours as needed for nausea or vomiting.   [DISCONTINUED] CLONIDINE HCL PO Take by mouth.   [DISCONTINUED] GABAPENTIN PO Take by mouth.   hydrALAZINE (APRESOLINE) 50 MG tablet Take 1/2 tablet (25 mg total) by mouth once a day as needed.   [DISCONTINUED] lisinopril (ZESTRIL) 10 MG tablet Take 1 tablet (10 mg  total) by mouth daily.   No facility-administered encounter medications on file as of 03/24/2021.    Follow-up: Return in about 4 weeks (around 04/21/2021) for Bp.   Kerin Perna, NP

## 2021-03-24 NOTE — Patient Instructions (Signed)
Diarrhea, Adult Diarrhea is frequent loose and watery bowel movements. Diarrhea can make you feel weak and cause you to become dehydrated. Dehydration can make you tiredand thirsty, cause you to have a dry mouth, and decrease how often you urinate. Diarrhea typically lasts 2-3 days. However, it can last longer if it is a sign of something more serious. It is important to treat your diarrhea as told byyour health care provider. Follow these instructions at home: Eating and drinking     Follow these recommendations as told by your health care provider: Take an oral rehydration solution (ORS). This is an over-the-counter medicine that helps return your body to its normal balance of nutrients and water. It is found at pharmacies and retail stores. Drink plenty of fluids, such as water, ice chips, diluted fruit juice, and low-calorie sports drinks. You can drink milk also, if desired. Avoid drinking fluids that contain a lot of sugar or caffeine, such as energy drinks, sports drinks, and soda. Eat bland, easy-to-digest foods in small amounts as you are able. These foods include bananas, applesauce, rice, lean meats, toast, and crackers. Avoid alcohol. Avoid spicy or fatty foods.  Medicines Take over-the-counter and prescription medicines only as told by your health care provider. If you were prescribed an antibiotic medicine, take it as told by your health care provider. Do not stop using the antibiotic even if you start to feel better. General instructions  Wash your hands often using soap and water. If soap and water are not available, use a hand sanitizer. Others in the household should wash their hands as well. Hands should be washed: After using the toilet or changing a diaper. Before preparing, cooking, or serving food. While caring for a sick person or while visiting someone in a hospital. Drink enough fluid to keep your urine pale yellow. Rest at home while you recover. Watch your  condition for any changes. Take a warm bath to relieve any burning or pain from frequent diarrhea episodes. Keep all follow-up visits as told by your health care provider. This is important.  Contact a health care provider if: You have a fever. Your diarrhea gets worse. You have new symptoms. You cannot keep fluids down. You feel light-headed or dizzy. You have a headache. You have muscle cramps. Get help right away if: You have chest pain. You feel extremely weak or you faint. You have bloody or black stools or stools that look like tar. You have severe pain, cramping, or bloating in your abdomen. You have trouble breathing or you are breathing very quickly. Your heart is beating very quickly. Your skin feels cold and clammy. You feel confused. You have signs of dehydration, such as: Dark urine, very little urine, or no urine. Cracked lips. Dry mouth. Sunken eyes. Sleepiness. Weakness. Summary Diarrhea is frequent loose and watery bowel movements. Diarrhea can make you feel weak and cause you to become dehydrated. Drink enough fluids to keep your urine pale yellow. Make sure that you wash your hands after using the toilet. If soap and water are not available, use hand sanitizer. Contact a health care provider if your diarrhea gets worse or you have new symptoms. Get help right away if you have signs of dehydration. This information is not intended to replace advice given to you by your health care provider. Make sure you discuss any questions you have with your healthcare provider. Document Revised: 12/25/2018 Document Reviewed: 01/12/2018 Elsevier Patient Education  [place].

## 2021-03-24 NOTE — Progress Notes (Signed)
Pt complains of serious fatigue  She has been throwing up every morning  Unable to eat  Losing weight  Needs referral to foot specialist ankle has been swelling

## 2021-03-25 ENCOUNTER — Other Ambulatory Visit: Payer: Self-pay

## 2021-03-25 LAB — CBC WITH DIFFERENTIAL/PLATELET
Basophils Absolute: 0.1 10*3/uL (ref 0.0–0.2)
Basos: 1 %
EOS (ABSOLUTE): 0.1 10*3/uL (ref 0.0–0.4)
Eos: 2 %
Hematocrit: 39 % (ref 34.0–46.6)
Hemoglobin: 14 g/dL (ref 11.1–15.9)
Immature Grans (Abs): 0 10*3/uL (ref 0.0–0.1)
Immature Granulocytes: 0 %
Lymphocytes Absolute: 1 10*3/uL (ref 0.7–3.1)
Lymphs: 18 %
MCH: 36 pg — ABNORMAL HIGH (ref 26.6–33.0)
MCHC: 35.9 g/dL — ABNORMAL HIGH (ref 31.5–35.7)
MCV: 100 fL — ABNORMAL HIGH (ref 79–97)
Monocytes Absolute: 0.5 10*3/uL (ref 0.1–0.9)
Monocytes: 10 %
Neutrophils Absolute: 3.7 10*3/uL (ref 1.4–7.0)
Neutrophils: 69 %
Platelets: 228 10*3/uL (ref 150–450)
RBC: 3.89 x10E6/uL (ref 3.77–5.28)
RDW: 13 % (ref 11.7–15.4)
WBC: 5.3 10*3/uL (ref 3.4–10.8)

## 2021-03-25 LAB — CMP14+EGFR
ALT: 78 IU/L — ABNORMAL HIGH (ref 0–32)
AST: 184 IU/L — ABNORMAL HIGH (ref 0–40)
Albumin/Globulin Ratio: 1.6 (ref 1.2–2.2)
Albumin: 4.2 g/dL (ref 3.8–4.9)
Alkaline Phosphatase: 270 IU/L — ABNORMAL HIGH (ref 44–121)
BUN/Creatinine Ratio: 10 (ref 9–23)
BUN: 10 mg/dL (ref 6–24)
Bilirubin Total: 0.6 mg/dL (ref 0.0–1.2)
CO2: 18 mmol/L — ABNORMAL LOW (ref 20–29)
Calcium: 8.7 mg/dL (ref 8.7–10.2)
Chloride: 97 mmol/L (ref 96–106)
Creatinine, Ser: 1.05 mg/dL — ABNORMAL HIGH (ref 0.57–1.00)
Globulin, Total: 2.7 g/dL (ref 1.5–4.5)
Glucose: 90 mg/dL (ref 65–99)
Potassium: 3 mmol/L — ABNORMAL LOW (ref 3.5–5.2)
Sodium: 136 mmol/L (ref 134–144)
Total Protein: 6.9 g/dL (ref 6.0–8.5)
eGFR: 64 mL/min/{1.73_m2} (ref >59)

## 2021-03-25 LAB — TSH+FREE T4
Free T4: 0.75 ng/dL — ABNORMAL LOW (ref 0.82–1.77)
TSH: 0.483 u[IU]/mL (ref 0.450–4.500)

## 2021-03-25 LAB — VITAMIN B12: Vitamin B-12: 1100 pg/mL (ref 232–1245)

## 2021-03-25 LAB — FOLATE: Folate: 2.1 ng/mL — ABNORMAL LOW (ref >3.0)

## 2021-03-31 ENCOUNTER — Telehealth (INDEPENDENT_AMBULATORY_CARE_PROVIDER_SITE_OTHER): Payer: Self-pay | Admitting: Primary Care

## 2021-03-31 DIAGNOSIS — R7989 Other specified abnormal findings of blood chemistry: Secondary | ICD-10-CM

## 2021-03-31 NOTE — Telephone Encounter (Signed)
Would you address labs and I will contact patient

## 2021-03-31 NOTE — Telephone Encounter (Signed)
Patient checking on the status of lab results, advised her of the below.   Copied from [place] [id number]. Topic: General - Other >> Mar 29, 2021  3:16 PM Leward Quan A wrote: Reason for CRM: Patient called in to request a call back with her lab results. Say she was able to work half day and having swelling in her and feet, dark urine. Per patient she is in need of help because she is about to loose everything she have because of the issues with her ankle and difficulty walking. Asking for help with her feet Ph# [phone] >> Mar 29, 2021  3:43 PM Nat Christen, CMA wrote: Results have not been addressed by PCP will contact patient once they have been addressed.

## 2021-04-01 ENCOUNTER — Other Ambulatory Visit (HOSPITAL_BASED_OUTPATIENT_CLINIC_OR_DEPARTMENT_OTHER): Payer: Self-pay

## 2021-04-01 MED ORDER — POTASSIUM CHLORIDE CRYS ER 20 MEQ PO TBCR
20.0000 meq | EXTENDED_RELEASE_TABLET | Freq: Every day | ORAL | 1 refills | Status: DC
  Filled 2021-04-01 – 2021-04-15 (×2): qty 30, 30d supply, fill #0

## 2021-04-01 NOTE — Telephone Encounter (Signed)
Separate result note has been sent

## 2021-04-02 ENCOUNTER — Telehealth (INDEPENDENT_AMBULATORY_CARE_PROVIDER_SITE_OTHER): Payer: Self-pay

## 2021-04-02 NOTE — Telephone Encounter (Signed)
-----   Message from Charlott Rakes, MD sent at 04/01/2021  1:31 PM EDT ----- Can you please ensure patient has seen this message?  I have also ordered a liver ultrasound which you will need to schedule for her. Thanks

## 2021-04-02 NOTE — Telephone Encounter (Signed)
Contacted patient and discussed results. She is aware of liver enzymes being elevated and due to her swelling a liver ultrasound has been ordered. Potassium low, prescription sent to pharmacy for potassium supplement. Patient verbalized understanding. Provided details of liver ultrasound. Wednesday August 17 at 11:30 with an 11:15 arrival. Nothing to eat or drink after midnight the night prior. Had patient recall appointments details to ensure accuracy. Nat Christen, CMA

## 2021-04-07 ENCOUNTER — Ambulatory Visit (INDEPENDENT_AMBULATORY_CARE_PROVIDER_SITE_OTHER): Payer: Self-pay | Admitting: *Deleted

## 2021-04-07 ENCOUNTER — Telehealth: Payer: Self-pay

## 2021-04-07 ENCOUNTER — Other Ambulatory Visit: Payer: Self-pay

## 2021-04-07 ENCOUNTER — Ambulatory Visit (HOSPITAL_COMMUNITY)
Admission: RE | Admit: 2021-04-07 | Discharge: 2021-04-07 | Disposition: A | Discharge status: Home / Self Care | Payer: Medicaid Other | Source: Ambulatory Visit | Attending: Family Medicine | Admitting: Family Medicine

## 2021-04-07 DIAGNOSIS — R7989 Other specified abnormal findings of blood chemistry: Secondary | ICD-10-CM | POA: Insufficient documentation

## 2021-04-07 NOTE — Telephone Encounter (Signed)
Patient has chronic foot and ankle pain- patient states her foot/ankle is swollen so bad that she is unable to walk. Patient advised UC for evaluation- patient states she can't go to UC because she does not have insurance. Patient advised of mobile unit /ED. Will send a message to provider for review.

## 2021-04-07 NOTE — Telephone Encounter (Signed)
Copied from [place] [id number]. Topic: Appointment Scheduling - Scheduling Inquiry for Clinic >> Apr 07, 2021  4:09 PM Erick Blinks wrote: Reason for CRM: Pt called to report that she needs to reschedule her appt due to her foot being swollen.   Best contact: [phone]   Please contact patient to reschedule. Nat Christen, CMA

## 2021-04-07 NOTE — Telephone Encounter (Signed)
Pt appt is scheduled for a virtual visit tomorrow.

## 2021-04-07 NOTE — Telephone Encounter (Signed)
Reason for Disposition  [1] Can't move swollen ankle at all AND [2] no fever  Answer Assessment - Initial Assessment Questions 1. LOCATION: "Which ankle is swollen?" "Where is the swelling?"     Left ankle and foot 2. ONSET: "When did the swelling start?"     1-2 years- swelling has gotten so bad today - she is unable to walk 3. SIZE: "How large is the swelling?"     Twice the size 4. PAIN: "Is there any pain?" If Yes, ask: "How bad is it?" (Scale 1-10; or mild, moderate, severe)   - NONE (0): no pain.   - MILD (1-3): doesn't interfere with normal activities.    - MODERATE (4-7): interferes with normal activities (e.g., work or school) or awakens from sleep, limping.    - SEVERE (8-10): excruciating pain, unable to do any normal activities, unable to walk.      Severe- redness 5. CAUSE: "What do you think caused the ankle swelling?"     Patient has x ray 1 month ago- multiple foot problems 6. OTHER SYMPTOMS: "Do you have any other symptoms?" (e.g., fever, chest pain, difficulty breathing, calf pain)     Pain can shoot up into the groin 7. PREGNANCY: "Is there any chance you are pregnant?" "When was your last menstrual period?"     N/a  Protocols used: Ankle Swelling-A-AH

## 2021-04-08 ENCOUNTER — Encounter (INDEPENDENT_AMBULATORY_CARE_PROVIDER_SITE_OTHER): Payer: Self-pay

## 2021-04-08 ENCOUNTER — Encounter (INDEPENDENT_AMBULATORY_CARE_PROVIDER_SITE_OTHER): Payer: Self-pay | Admitting: Clinical

## 2021-04-09 ENCOUNTER — Emergency Department (HOSPITAL_COMMUNITY): Payer: Medicaid Other

## 2021-04-09 ENCOUNTER — Other Ambulatory Visit: Payer: Self-pay

## 2021-04-09 ENCOUNTER — Encounter (HOSPITAL_COMMUNITY): Payer: Self-pay | Admitting: Emergency Medicine

## 2021-04-09 ENCOUNTER — Inpatient Hospital Stay (HOSPITAL_COMMUNITY)
Admission: EM | Admit: 2021-04-09 | Discharge: 2021-04-14 | DRG: 439 | Disposition: A | Discharge status: Home Health | Payer: Medicaid Other | Attending: Internal Medicine | Admitting: Internal Medicine

## 2021-04-09 DIAGNOSIS — G894 Chronic pain syndrome: Secondary | ICD-10-CM

## 2021-04-09 DIAGNOSIS — Z880 Allergy status to penicillin: Secondary | ICD-10-CM | POA: Diagnosis not present

## 2021-04-09 DIAGNOSIS — Z716 Tobacco abuse counseling: Secondary | ICD-10-CM

## 2021-04-09 DIAGNOSIS — K852 Alcohol induced acute pancreatitis without necrosis or infection: Principal | ICD-10-CM | POA: Diagnosis present

## 2021-04-09 DIAGNOSIS — R110 Nausea: Secondary | ICD-10-CM | POA: Diagnosis present

## 2021-04-09 DIAGNOSIS — K5900 Constipation, unspecified: Secondary | ICD-10-CM | POA: Diagnosis present

## 2021-04-09 DIAGNOSIS — K838 Other specified diseases of biliary tract: Secondary | ICD-10-CM

## 2021-04-09 DIAGNOSIS — Z681 Body mass index (BMI) 19 or less, adult: Secondary | ICD-10-CM

## 2021-04-09 DIAGNOSIS — F10230 Alcohol dependence with withdrawal, uncomplicated: Secondary | ICD-10-CM | POA: Diagnosis not present

## 2021-04-09 DIAGNOSIS — E440 Moderate protein-calorie malnutrition: Secondary | ICD-10-CM | POA: Diagnosis present

## 2021-04-09 DIAGNOSIS — Z733 Stress, not elsewhere classified: Secondary | ICD-10-CM

## 2021-04-09 DIAGNOSIS — Z609 Problem related to social environment, unspecified: Secondary | ICD-10-CM | POA: Diagnosis present

## 2021-04-09 DIAGNOSIS — K7010 Alcoholic hepatitis without ascites: Secondary | ICD-10-CM | POA: Diagnosis present

## 2021-04-09 DIAGNOSIS — Z7289 Other problems related to lifestyle: Secondary | ICD-10-CM

## 2021-04-09 DIAGNOSIS — E876 Hypokalemia: Secondary | ICD-10-CM | POA: Diagnosis present

## 2021-04-09 DIAGNOSIS — F17210 Nicotine dependence, cigarettes, uncomplicated: Secondary | ICD-10-CM | POA: Diagnosis present

## 2021-04-09 DIAGNOSIS — K851 Biliary acute pancreatitis without necrosis or infection: Secondary | ICD-10-CM | POA: Diagnosis not present

## 2021-04-09 DIAGNOSIS — K8050 Calculus of bile duct without cholangitis or cholecystitis without obstruction: Secondary | ICD-10-CM | POA: Diagnosis present

## 2021-04-09 DIAGNOSIS — Z20822 Contact with and (suspected) exposure to covid-19: Secondary | ICD-10-CM | POA: Diagnosis present

## 2021-04-09 DIAGNOSIS — I10 Essential (primary) hypertension: Secondary | ICD-10-CM

## 2021-04-09 DIAGNOSIS — K760 Fatty (change of) liver, not elsewhere classified: Secondary | ICD-10-CM | POA: Diagnosis present

## 2021-04-09 DIAGNOSIS — F1021 Alcohol dependence, in remission: Secondary | ICD-10-CM

## 2021-04-09 DIAGNOSIS — M109 Gout, unspecified: Secondary | ICD-10-CM

## 2021-04-09 DIAGNOSIS — M79672 Pain in left foot: Secondary | ICD-10-CM

## 2021-04-09 DIAGNOSIS — R748 Abnormal levels of other serum enzymes: Secondary | ICD-10-CM

## 2021-04-09 DIAGNOSIS — M10472 Other secondary gout, left ankle and foot: Secondary | ICD-10-CM | POA: Diagnosis not present

## 2021-04-09 DIAGNOSIS — R1010 Upper abdominal pain, unspecified: Secondary | ICD-10-CM | POA: Diagnosis not present

## 2021-04-09 DIAGNOSIS — R112 Nausea with vomiting, unspecified: Secondary | ICD-10-CM

## 2021-04-09 DIAGNOSIS — Z79891 Long term (current) use of opiate analgesic: Secondary | ICD-10-CM | POA: Diagnosis not present

## 2021-04-09 DIAGNOSIS — R64 Cachexia: Secondary | ICD-10-CM | POA: Diagnosis present

## 2021-04-09 DIAGNOSIS — R109 Unspecified abdominal pain: Secondary | ICD-10-CM

## 2021-04-09 DIAGNOSIS — R1013 Epigastric pain: Secondary | ICD-10-CM

## 2021-04-09 HISTORY — DX: Chronic pain syndrome: G89.4

## 2021-04-09 HISTORY — DX: Essential (primary) hypertension: I10

## 2021-04-09 HISTORY — DX: Gout, unspecified: M10.9

## 2021-04-09 HISTORY — DX: Alcohol dependence with withdrawal, uncomplicated: F10.230

## 2021-04-09 HISTORY — DX: Alcohol dependence, uncomplicated: F10.20

## 2021-04-09 HISTORY — DX: Other specified diseases of biliary tract: K83.8

## 2021-04-09 HISTORY — DX: Biliary acute pancreatitis without necrosis or infection: K85.10

## 2021-04-09 LAB — CBC WITH DIFFERENTIAL/PLATELET
Abs Immature Granulocytes: 0.03 10*3/uL (ref 0.00–0.07)
Basophils Absolute: 0 10*3/uL (ref 0.0–0.1)
Basophils Relative: 0 %
Eosinophils Absolute: 0 10*3/uL (ref 0.0–0.5)
Eosinophils Relative: 0 %
HCT: 41.7 % (ref 36.0–46.0)
Hemoglobin: 14.8 g/dL (ref 12.0–15.0)
Immature Granulocytes: 0 %
Lymphocytes Relative: 11 %
Lymphs Abs: 0.8 10*3/uL (ref 0.7–4.0)
MCH: 36.4 pg — ABNORMAL HIGH (ref 26.0–34.0)
MCHC: 35.5 g/dL (ref 30.0–36.0)
MCV: 102.5 fL — ABNORMAL HIGH (ref 80.0–100.0)
Monocytes Absolute: 0.7 10*3/uL (ref 0.1–1.0)
Monocytes Relative: 9 %
Neutro Abs: 6 10*3/uL (ref 1.7–7.7)
Neutrophils Relative %: 80 %
Platelets: 198 10*3/uL (ref 150–400)
RBC: 4.07 MIL/uL (ref 3.87–5.11)
RDW: 13.1 % (ref 11.5–15.5)
WBC: 7.6 10*3/uL (ref 4.0–10.5)
nRBC: 0 % (ref 0.0–0.2)

## 2021-04-09 LAB — URINALYSIS, ROUTINE W REFLEX MICROSCOPIC
Bilirubin Urine: NEGATIVE
Glucose, UA: NEGATIVE mg/dL
Ketones, ur: NEGATIVE mg/dL
Nitrite: NEGATIVE
Protein, ur: NEGATIVE mg/dL
Specific Gravity, Urine: 1.019 (ref 1.005–1.030)
pH: 6 (ref 5.0–8.0)

## 2021-04-09 LAB — COMPREHENSIVE METABOLIC PANEL
ALT: 41 U/L (ref 0–44)
AST: 99 U/L — ABNORMAL HIGH (ref 15–41)
Albumin: 3.2 g/dL — ABNORMAL LOW (ref 3.5–5.0)
Alkaline Phosphatase: 262 U/L — ABNORMAL HIGH (ref 38–126)
Anion gap: 17 — ABNORMAL HIGH (ref 5–15)
BUN: 12 mg/dL (ref 6–20)
CO2: 24 mmol/L (ref 22–32)
Calcium: 9.4 mg/dL (ref 8.9–10.3)
Chloride: 96 mmol/L — ABNORMAL LOW (ref 98–111)
Creatinine, Ser: 0.92 mg/dL (ref 0.44–1.00)
GFR, Estimated: >60 mL/min (ref >60)
Glucose, Bld: 121 mg/dL — ABNORMAL HIGH (ref 70–99)
Potassium: 2.8 mmol/L — ABNORMAL LOW (ref 3.5–5.1)
Sodium: 137 mmol/L (ref 135–145)
Total Bilirubin: 2 mg/dL — ABNORMAL HIGH (ref 0.3–1.2)
Total Protein: 7.4 g/dL (ref 6.5–8.1)

## 2021-04-09 LAB — RESP PANEL BY RT-PCR (FLU A&B, COVID) ARPGX2
Influenza A by PCR: NEGATIVE
Influenza B by PCR: NEGATIVE
SARS Coronavirus 2 by RT PCR: NEGATIVE

## 2021-04-09 LAB — LIPASE, BLOOD: Lipase: 319 U/L — ABNORMAL HIGH (ref 11–51)

## 2021-04-09 LAB — TROPONIN I (HIGH SENSITIVITY): Troponin I (High Sensitivity): 15 ng/L (ref <18)

## 2021-04-09 MED ORDER — LORAZEPAM 2 MG/ML IJ SOLN
1.0000 mg | Freq: Once | INTRAMUSCULAR | Status: AC
  Administered 2021-04-09: 1 mg via INTRAVENOUS
  Filled 2021-04-09: qty 1

## 2021-04-09 MED ORDER — ONDANSETRON HCL 4 MG/2ML IJ SOLN: 4.0000 mg | Freq: Four times a day (QID) | INTRAMUSCULAR | Status: DC | PRN

## 2021-04-09 MED ORDER — METHYLPREDNISOLONE SODIUM SUCC 40 MG IJ SOLR
40.0000 mg | Freq: Once | INTRAMUSCULAR | Status: AC
  Administered 2021-04-09: 40 mg via INTRAVENOUS
  Filled 2021-04-09: qty 1

## 2021-04-09 MED ORDER — SODIUM CHLORIDE 0.9% FLUSH
3.0000 mL | Freq: Two times a day (BID) | INTRAVENOUS | Status: DC
  Administered 2021-04-09 – 2021-04-14 (×10): 3 mL via INTRAVENOUS

## 2021-04-09 MED ORDER — CLONIDINE HCL 0.2 MG/24HR TD PTWK
0.2000 mg | MEDICATED_PATCH | TRANSDERMAL | Status: DC
  Administered 2021-04-09: 0.2 mg via TRANSDERMAL
  Filled 2021-04-09 (×2): qty 1

## 2021-04-09 MED ORDER — THIAMINE HCL 100 MG PO TABS
100.0000 mg | ORAL_TABLET | Freq: Every day | ORAL | Status: DC
  Administered 2021-04-10 – 2021-04-14 (×5): 100 mg via ORAL
  Filled 2021-04-09 (×5): qty 1

## 2021-04-09 MED ORDER — LORAZEPAM 1 MG PO TABS
1.0000 mg | ORAL_TABLET | ORAL | Status: AC | PRN
  Administered 2021-04-10 (×2): 1 mg via ORAL
  Filled 2021-04-09 (×3): qty 1

## 2021-04-09 MED ORDER — IOHEXOL 350 MG/ML SOLN
100.0000 mL | Freq: Once | INTRAVENOUS | Status: AC | PRN
  Administered 2021-04-09: 80 mL via INTRAVENOUS

## 2021-04-09 MED ORDER — ONDANSETRON HCL 4 MG/2ML IJ SOLN
4.0000 mg | Freq: Once | INTRAMUSCULAR | Status: AC
  Administered 2021-04-09: 4 mg via INTRAVENOUS
  Filled 2021-04-09: qty 2

## 2021-04-09 MED ORDER — BUPRENORPHINE HCL-NALOXONE HCL 2-0.5 MG SL SUBL
2.0000 | SUBLINGUAL_TABLET | Freq: Once | SUBLINGUAL | Status: AC
  Administered 2021-04-09: 2 via SUBLINGUAL
  Filled 2021-04-09: qty 2

## 2021-04-09 MED ORDER — LORAZEPAM 2 MG/ML IJ SOLN
1.0000 mg | INTRAMUSCULAR | Status: AC | PRN
  Administered 2021-04-10: 1 mg via INTRAVENOUS
  Administered 2021-04-10: 2 mg via INTRAVENOUS
  Filled 2021-04-09 (×2): qty 1

## 2021-04-09 MED ORDER — ADULT MULTIVITAMIN W/MINERALS CH
1.0000 | ORAL_TABLET | Freq: Every day | ORAL | Status: DC
  Administered 2021-04-10 – 2021-04-14 (×5): 1 via ORAL
  Filled 2021-04-09 (×5): qty 1

## 2021-04-09 MED ORDER — FOLIC ACID 1 MG PO TABS
1.0000 mg | ORAL_TABLET | Freq: Every day | ORAL | Status: DC
  Administered 2021-04-10 – 2021-04-14 (×5): 1 mg via ORAL
  Filled 2021-04-09 (×5): qty 1

## 2021-04-09 MED ORDER — CLONIDINE HCL 0.1 MG PO TABS
0.1000 mg | ORAL_TABLET | Freq: Once | ORAL | Status: AC
  Administered 2021-04-09: 0.1 mg via ORAL
  Filled 2021-04-09: qty 1

## 2021-04-09 MED ORDER — CHLORDIAZEPOXIDE HCL 25 MG PO CAPS
50.0000 mg | ORAL_CAPSULE | Freq: Once | ORAL | Status: AC
  Administered 2021-04-09: 50 mg via ORAL
  Filled 2021-04-09: qty 2

## 2021-04-09 MED ORDER — NICOTINE 21 MG/24HR TD PT24
21.0000 mg | MEDICATED_PATCH | Freq: Once | TRANSDERMAL | Status: AC
  Administered 2021-04-09: 21 mg via TRANSDERMAL
  Filled 2021-04-09: qty 1

## 2021-04-09 MED ORDER — POTASSIUM CHLORIDE 20 MEQ PO PACK
40.0000 meq | PACK | Freq: Two times a day (BID) | ORAL | Status: DC
  Administered 2021-04-09 (×2): 40 meq via ORAL
  Filled 2021-04-09 (×3): qty 2

## 2021-04-09 MED ORDER — MAGNESIUM SULFATE 2 GM/50ML IV SOLN
2.0000 g | Freq: Once | INTRAVENOUS | Status: AC
  Administered 2021-04-09: 2 g via INTRAVENOUS
  Filled 2021-04-09: qty 50

## 2021-04-09 MED ORDER — THIAMINE HCL 100 MG/ML IJ SOLN
Freq: Once | INTRAVENOUS | Status: AC
  Filled 2021-04-09: qty 1000

## 2021-04-09 MED ORDER — HYDROMORPHONE HCL 1 MG/ML IJ SOLN
0.5000 mg | INTRAMUSCULAR | Status: DC | PRN
Start: 2021-04-09 — End: 2021-04-14
  Administered 2021-04-12 – 2021-04-13 (×2): 1 mg via INTRAVENOUS
  Administered 2021-04-13: 0.5 mg via INTRAVENOUS
  Administered 2021-04-13: 1 mg via INTRAVENOUS
  Administered 2021-04-14: 0.5 mg via INTRAVENOUS
  Filled 2021-04-09 (×6): qty 1

## 2021-04-09 MED ORDER — THIAMINE HCL 100 MG/ML IJ SOLN: 100.0000 mg | Freq: Every day | INTRAMUSCULAR | Status: DC

## 2021-04-09 MED ORDER — ONDANSETRON HCL 4 MG PO TABS: 4.0000 mg | ORAL_TABLET | Freq: Four times a day (QID) | ORAL | Status: DC | PRN

## 2021-04-09 MED ORDER — LACTATED RINGERS IV BOLUS
1000.0000 mL | Freq: Once | INTRAVENOUS | Status: AC
  Administered 2021-04-09: 1000 mL via INTRAVENOUS

## 2021-04-09 MED ORDER — SODIUM CHLORIDE 0.9 % IV SOLN: INTRAVENOUS | Status: DC

## 2021-04-09 NOTE — ED Notes (Signed)
Returns from CT

## 2021-04-09 NOTE — ED Notes (Signed)
Pt aware urine sample needed. Unable to provide one at this time.

## 2021-04-09 NOTE — ED Notes (Signed)
Patient is alert and answers all questions appropriately.  Patient has multiple complaints.  Reports abd pain and left ankle pain.  Patient denies any injury to left ankle but edema noted and has been using crutches to ambulate

## 2021-04-09 NOTE — ED Notes (Signed)
Room assignment has been rejected and needing telemetry bed.

## 2021-04-09 NOTE — ED Triage Notes (Signed)
Patient arrives via EMS from her home with co abd with nausea and vomiting x 2 weeks.  Patient reports that she had an ultrasound done yesterday with [facility].  Patient has edema in left ankle.  Patient reports that she drinks liquor every day

## 2021-04-09 NOTE — ED Notes (Signed)
Pt. Reminded urine sample needed. Pt placed on purwick.

## 2021-04-09 NOTE — ED Notes (Signed)
Patient is resting.  IV bolus continues

## 2021-04-09 NOTE — ED Notes (Signed)
Secure message sent to Willis Modena, RN- ready for this admission patient?

## 2021-04-09 NOTE — H&P (Signed)
History and Physical  [name] SWF:[id number] DOB: 06/10/1971 DOA: 04/09/2021  PCP: Kerin Perna, NP   Chief Complaint: vomiting  HPI:  50 year old woman PMH hypertension, alcoholism, presented to the emergency department with vomiting, abdominal pain, chills, poor oral intake.  Admitted for intractable nausea, vomiting, pancreatitis, possible gallstone induced, suspected gout.  Patient reports vomiting on and off for the last 3 weeks in cycles, at least once per day, only made better by sleeping, very poor oral intake but able to drink some fluids.  She drinks about a half of 1/5 of liquor per day, last drink this morning.  Smokes about a pack per day.  Vomiting is associated with abdominal pain, predominantly epigastric in nature.  She reports some chills but is not aware of fever.  Takes Suboxone she reports her chronic pain, denies drug use, last Suboxone dose this morning.  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  Significant social stress, because of inability to work secondary to right ankle pain and vomiting she may be evicted and lose everything.  ED Course: Treated with Suboxone, Librium, Catapres, Ativan, fluids.  Work-up notable for mildly elevated lipase, elevated LFTs including total bilirubin, dilated common bile duct.  Review of Systems:  Somewhat positive in nature, no fever but reports chills, she has had some changes in her vision over time, not clear how acute, some sore throat at night, no rash, no chest pain or shortness of breath, positive for dysuria, negative for bleeding, no constipation  PMH Essential hypertension Alcohol dependence Nephrolithiasis Remainder reviewed in Epic  [place] LEEP Lithotripsy Tubal ligation Remainder reviewed in Epic  Family history includes: Mother with thyroid problems Remainder  reviewed in [place] about a half a tablet per day Smokes 1 pack/day Denies drugs  Allergies Penicillin caused hives Remainder reviewed in Epic  Medications Current Outpatient Medications  Medication Instructions   Buprenorphine HCl-Naloxone HCl (SUBOXONE SL) Sublingual   cloNIDine (CATAPRES - DOSED IN MG/24 HR) 0.2 mg, Transdermal, Weekly   erythromycin ophthalmic ointment Place a 1/2 inch ribbon of ointment into the lower eyelid four times daily for 5 days.   hydrALAZINE (APRESOLINE) 50 MG tablet Take 1/2 tablet (25 mg total) by mouth once a day as needed.   HYDROcodone-acetaminophen (NORCO/VICODIN) 5-325 MG tablet 1 tablet, Oral, Every 4 hours PRN   ondansetron (ZOFRAN ODT) 4 mg, Oral, Every 8 hours PRN   potassium chloride SA (KLOR-CON) 20 MEQ tablet 20 mEq, Oral, Daily    Physicial Exam   Vitals:  98.2, 20, 77, 139/97, 99% on room air  Physical Exam Vitals reviewed.  Constitutional:      General: She is not in acute distress.    Appearance: She is ill-appearing. She is not toxic-appearing.     Comments: Tremulous  HENT:     Head: Normocephalic.     Nose: Nose normal.     Mouth/Throat:     Pharynx: Oropharynx is clear.  Eyes:     General: No scleral icterus.       Right eye: No discharge.        Left eye: No discharge.  Cardiovascular:     Rate and Rhythm: Normal rate and regular rhythm.     Heart sounds: No murmur heard.   No gallop.  Pulmonary:     Effort: Pulmonary  effort is normal. No respiratory distress.     Breath sounds: Normal breath sounds. No wheezing, rhonchi or rales.  Abdominal:     General: Abdomen is flat. There is no distension.     Palpations: Abdomen is soft. There is no mass.     Tenderness: There is abdominal tenderness (Mild epigastric tenderness). There is no guarding.     Hernia: No hernia is present.  Musculoskeletal:     Cervical back: No tenderness.     Right lower leg: No edema.     Left lower leg: Edema (No  thyromegaly no masses or tophi seen.  There are some erythema around the medial and lateral malleolus.  Very tender to touch) present.  Skin:    Comments: Right index finger has raised nodule suggestive of tophi.  Neurological:     General: No focal deficit present.     Mental Status: She is alert.  Psychiatric:        Mood and Affect: Mood normal.        Behavior: Behavior normal.           I have personally reviewed labs and imaging studies, making particular note of  Labs:  Potassium 2.8, alkaline phosphatase 263, lipase 3019, total bilirubin 2.0, AST of 99  Imaging studies:  Chest x-ray independently reviewed no acute disease CT abdomen pelvis mild intra and extrahepatic ductal dilatation with common bile duct measuring 1.2 cm.  Medical tests:  EKG independently reviewed: Sinus tachycardia, no acute changes  ASSESSMENT/PLAN  Intractable nausea, vomiting and abdominal pain with mildly elevated lipase, elevated LFTs, suspect acute/chronic pancreatitis, differential alcoholic versus gallstone; complicated by alcohol use. --NPO, antiemetics, analgesia, supportive care, aggressive IV fluid resuscitation --Trend CMP, lipase  Dilated intra and extrahepatic ducts --MRCP, GI consultation  Alcohol dependence with mild acute alcohol withdrawal --Already tremulous, last drink this morning, monitor for withdrawal, CIWA.  TOC consultation.  Probable gout --Present for several weeks.  Solumedrol x1, continue tomorrow unless able to take predisone.  Chronic pain syndrome on Suboxone --Continue Suboxone once dose verified by pharmacy  Essential hypertension --Stable.  Continue clonidine.  DVT prophylaxis: SCDs Code Status: Full Family Communication: none Consults called: [facility]    Time spent: 60 minutes  Murray Hodgkins, MD  [facility] Direct contact: see [email or web address]  7PM-7AM contact night coverage as below   Check the care team in [facility] and look for a)  attending/consulting [place] provider listed and b) the [facility] team listed Log into [email or web address] and use [place]'s universal password to access. If you do not have the password, please contact the hospital operator. Locate the [facility] provider you are looking for under [facility] and page to a number that you can be directly reached. If you still have difficulty reaching the provider, please page the [facility] (Director on Call) for the Hospitalists listed on amion for assistance.  Severity of Illness: The appropriate patient status for this patient is INPATIENT. Inpatient status is judged to be reasonable and necessary in order to provide the required intensity of service to ensure the patient's safety. The patient's presenting symptoms, physical exam findings, and initial radiographic and laboratory data in the context of their chronic comorbidities is felt to place them at high risk for further clinical deterioration. Furthermore, it is not anticipated that the patient will be medically stable for discharge from the hospital within 2 midnights of admission. The following factors support the patient status of inpatient.   " The patient's presenting symptoms  include intractable nausea, vomiting, chills, left ankle pain, abdominal pain. " The worrisome physical exam findings include abdominal pain, tremulousness, left ankle swelling. " The initial radiographic and laboratory data are worrisome because of pancreatitis, possible gallstone induced, dilated intra and extrahepatic ducts. " The chronic co-morbidities include alcohol dependence.   * I certify that at the point of admission it is my clinical judgment that the patient will require inpatient hospital care spanning beyond 2 midnights from the point of admission due to high intensity of service, high risk for further deterioration and high frequency of surveillance required.*  Status is: Inpatient  Remains inpatient appropriate because:Ongoing  diagnostic testing needed not appropriate for outpatient work up, IV treatments appropriate due to intensity of illness or inability to take PO, and Inpatient level of care appropriate due to severity of illness  Dispo: The patient is from: Home              Anticipated d/c is to: Home              Patient currently is not medically stable to d/c.   Difficult to place patient No  04/09/2021, 6:47 PM  Principal Problem:   Gallstone pancreatitis Active Problems:   Gout   Dilated cbd, acquired   Alcohol dependence with uncomplicated withdrawal (HCC)   Chronic pain syndrome   Benign essential HTN

## 2021-04-09 NOTE — ED Provider Notes (Signed)
[facility] DEPT Provider Note   CSN: [id number] Arrival date & time: 04/09/21  1030     History Chief Complaint  Patient presents with   Abdominal Pain    [name] is a 50 y.o. female.   Abdominal Pain Associated symptoms: chills, fatigue, nausea and vomiting   Associated symptoms: no chest pain, no constipation, no cough, no diarrhea, no dysuria, no fever, no hematuria, no shortness of breath and no sore throat   Patient presents for nausea, vomiting, chills, and p.o. intolerance.  Symptoms have been present for the past week.  Currently, she endorses continued chills, nausea, and some chest tightness.  Due to her p.o. intolerance, she feels that she has become dehydrated.  She also endorses ongoing pain to her left ankle.  This is following an injury in May.  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  This worsens her ankle pain.  She has not been able to work for the past week due to all of her other ongoing symptoms.  Patient has a history of polysubstance abuse.  She states that she has cut back on her alcohol intake.  Currently she drinks a half a bottle of 80 proof liquor per day.  Last drink was 4 hours ago.  She is also on Suboxone, which she takes in the mornings.  She missed her morning dose of Suboxone today.  She has pain in her epigastrium and right side of lower back.  Due to her inability to work, she has concerns that she may be evicted and "lose everything".    Past Medical History:  Diagnosis Date   Alcohol dependence ([place])    Hypertension    Kidney stones     Patient Active Problem List   Diagnosis Date Noted   Gallstone pancreatitis 04/09/2021   Gout 04/09/2021   Dilated cbd, acquired 04/09/2021   Alcohol dependence with uncomplicated withdrawal ([place]) 04/09/2021   Chronic pain syndrome 04/09/2021   Benign essential HTN 04/09/2021    Past Surgical History:  Procedure Laterality Date   LEEP      LITHOTRIPSY     TUBAL LIGATION       OB History   No obstetric history on file.     Family History  Problem Relation Age of Onset   Thyroid disease Mother     Social History   Tobacco Use   Smoking status: Every Day    Packs/day: 1.00    Types: Cigarettes   Smokeless tobacco: Never  Substance Use Topics   Alcohol use: Yes    Comment: 1/5 a fifth daily   Drug use: Not Currently    Home Medications Prior to Admission medications   Medication Sig Start Date End Date Taking? Authorizing Provider  Buprenorphine HCl-Naloxone HCl (SUBOXONE SL) Place under the tongue.    [provider]  cloNIDine (CATAPRES - DOSED IN MG/24 HR) 0.2 mg/24hr patch Place 1 patch (0.2 mg total) onto the skin once a week. 03/24/21   Kerin Perna, NP  erythromycin ophthalmic ointment Place a 1/2 inch ribbon of ointment into the lower eyelid four times daily for 5 days. 05/14/16   Frederica Kuster, PA-C  hydrALAZINE (APRESOLINE) 50 MG tablet Take 1/2 tablet (25 mg total) by mouth once a day as needed. 01/08/21 03/09/21  Alfredia Client, PA-C  HYDROcodone-acetaminophen (NORCO/VICODIN) 5-325 MG tablet Take 1 tablet by mouth every 4 (four) hours as needed. 05/13/19   Tegeler,  Gwenyth Allegra, MD  ondansetron (ZOFRAN ODT) 4 MG disintegrating tablet Take 1 tablet (4 mg total) by mouth every 8 (eight) hours as needed for nausea or vomiting. 03/24/21   Kerin Perna, NP  potassium chloride SA (KLOR-CON) 20 MEQ tablet Take 1 tablet (20 mEq total) by mouth daily. 04/01/21   Charlott Rakes, MD    Allergies    Penicillins  Review of Systems   Review of Systems  Constitutional:  Positive for appetite change, chills and fatigue. Negative for fever.  HENT:  Negative for congestion, ear pain, sinus pain, sore throat and trouble swallowing.   Eyes:  Negative for photophobia, pain and visual disturbance.  Respiratory:  Positive for chest tightness. Negative for cough, shortness of breath, wheezing and  stridor.   Cardiovascular:  Negative for chest pain and palpitations.  Gastrointestinal:  Positive for abdominal pain, nausea and vomiting. Negative for abdominal distention, constipation and diarrhea.  Genitourinary:  Negative for dysuria, flank pain, frequency, hematuria and pelvic pain.  Musculoskeletal:  Positive for arthralgias (L ankle). Negative for back pain, joint swelling, myalgias and neck pain.  Skin:  Negative for color change and rash.  Neurological:  Positive for tremors. Negative for dizziness, seizures, syncope, weakness, numbness and headaches.  Hematological:  Does not bruise/bleed easily.  Psychiatric/Behavioral:  Negative for confusion, decreased concentration and hallucinations. The patient is nervous/anxious.   All other systems reviewed and are negative.  Physical Exam Updated Vital Signs BP (!) 139/97   Pulse 80   Temp 98.2 F (36.8 C) (Oral)   Resp (!) 21   Ht 5' 7"  (1.702 m)   Wt 51.7 kg   SpO2 100%   BMI 17.85 kg/m   Physical Exam Vitals and nursing note reviewed.  Constitutional:      General: She is not in acute distress.    Appearance: She is well-developed. She is not toxic-appearing or diaphoretic.  HENT:     Head: Normocephalic and atraumatic.     Mouth/Throat:     Mouth: Mucous membranes are moist.     Pharynx: Oropharynx is clear.  Eyes:     Extraocular Movements: Extraocular movements intact.     Conjunctiva/sclera: Conjunctivae normal.  Cardiovascular:     Rate and Rhythm: Regular rhythm. Tachycardia present.     Heart sounds: No murmur heard. Pulmonary:     Effort: Pulmonary effort is normal. No respiratory distress.     Breath sounds: Normal breath sounds. No wheezing or rales.  Abdominal:     Palpations: Abdomen is soft. There is no mass.     Tenderness: There is abdominal tenderness in the epigastric area. There is no right CVA tenderness, left CVA tenderness or guarding. Negative signs include Murphy's sign.  Musculoskeletal:      Cervical back: Neck supple.  Skin:    General: Skin is warm and dry.     Coloration: Skin is not jaundiced or pale.  Neurological:     General: No focal deficit present.     Mental Status: She is alert and oriented to person, place, and time.     Cranial Nerves: No cranial nerve deficit.     Motor: No weakness.  Psychiatric:        Attention and Perception: Perception normal. She does not perceive auditory or visual hallucinations.        Mood and Affect: Mood is anxious.        Speech: Speech normal. Speech is not slurred.  Behavior: Behavior normal. Behavior is cooperative.        Thought Content: Thought content normal. Thought content does not include homicidal or suicidal ideation.    ED Results / Procedures / Treatments   Labs (all labs ordered are listed, but only abnormal results are displayed) Labs Reviewed  COMPREHENSIVE METABOLIC PANEL - Abnormal; Notable for the following components:      Result Value   Potassium 2.8 (*)    Chloride 96 (*)    Glucose, Bld 121 (*)    Albumin 3.2 (*)    AST 99 (*)    Alkaline Phosphatase 262 (*)    Total Bilirubin 2.0 (*)    Anion gap 17 (*)    All other components within normal limits  LIPASE, BLOOD - Abnormal; Notable for the following components:   Lipase 319 (*)    All other components within normal limits  CBC WITH DIFFERENTIAL/PLATELET - Abnormal; Notable for the following components:   MCV 102.5 (*)    MCH 36.4 (*)    All other components within normal limits  URINALYSIS, ROUTINE W REFLEX MICROSCOPIC - Abnormal; Notable for the following components:   Color, Urine AMBER (*)    APPearance HAZY (*)    Hgb urine dipstick SMALL (*)    Leukocytes,Ua MODERATE (*)    Bacteria, UA RARE (*)    All other components within normal limits  RESP PANEL BY RT-PCR (FLU A&B, COVID) ARPGX2  MAGNESIUM  PHOSPHORUS  I-STAT CHEM 8, ED  TROPONIN I (HIGH SENSITIVITY)    EKG EKG Interpretation  Date/Time:  Friday April 09 2021  10:42:17 EDT Ventricular Rate:  103 PR Interval:  143 QRS Duration: 94 QT Interval:  358 QTC Calculation: 469 R Axis:   53 Text Interpretation: Sinus tachycardia Consider right atrial enlargement Left ventricular hypertrophy Anterior infarct, old Nonspecific T abnormalities, lateral leads Confirmed by Godfrey Pick [phone]) on 04/09/2021 11:47:54 AM  Radiology CT ABDOMEN PELVIS W CONTRAST  Result Date: 04/09/2021 CLINICAL DATA:  Nausea/vomiting Epigastric pain EXAM: CT ABDOMEN AND PELVIS WITH CONTRAST TECHNIQUE: Multidetector CT imaging of the abdomen and pelvis was performed using the standard protocol following bolus administration of intravenous contrast. CONTRAST:  91m OMNIPAQUE IOHEXOL 350 MG/ML SOLN COMPARISON:  Ultrasound 04/07/2021, CT abdomen pelvis 04/24/2009 FINDINGS: Lower chest: Left basilar linear atelectasis. Hepatobiliary: No focal liver abnormality. Likely perfusional anomaly in the quadrate. The gallbladder is unremarkable. There is mild intrahepatic and extrahepatic ductal dilation with common duct measuring up to 1.2 cm. Pancreas: Unremarkable. No pancreatic ductal dilatation or surrounding inflammatory changes. Spleen: Normal in size without focal abnormality. Adrenals/Urinary Tract: Adrenal glands are unremarkable. Multifocal right renal cortical scarring. No hydronephrosis. The left kidney is unremarkable with exception of multiple small subcentimeter renal cysts. No nephroureterolithiasis. The bladder is well distended and unremarkable. Stomach/Bowel: Stomach is within normal limits. There is no evidence of bowel obstruction. Prior appendectomy. Vascular/Lymphatic: Aortoiliac atherosclerotic calcifications. No AAA. No lymphadenopathy. Reproductive: Unremarkable. Other: No abdominal wall hernia or abnormality. No abdominopelvic ascites. Musculoskeletal: No acute or significant osseous findings. IMPRESSION: Mild intra and extrahepatic ductal dilation with common duct measuring up to 1.2  cm. Correlate with laboratory findings and consider MRCP if clinically indicated. No other acute findings.  Multifocal right renal cortical scarring. Electronically Signed   By: JMaurine SimmeringM.D.   On: 04/09/2021 17:13   DG Chest Portable 1 View  Result Date: 04/09/2021 CLINICAL DATA:  chest heaviness EXAM: PORTABLE CHEST 1 VIEW COMPARISON:  10/06/2009, 02/08/2009 FINDINGS: The  heart size and mediastinal contours are within normal limits. Bilateral nipple shadows, as seen on prior studies. Both lungs are clear. The visualized skeletal structures are unremarkable. IMPRESSION: No active disease. Electronically Signed   By: Davina Poke D.O.   On: 04/09/2021 12:20    Procedures Procedures   Medications Ordered in ED Medications  potassium chloride (KLOR-CON) packet 40 mEq (40 mEq Oral Given 04/09/21 1550)  nicotine (NICODERM CQ - dosed in mg/24 hours) patch 21 mg (21 mg Transdermal Patch Applied 04/09/21 1731)  LORazepam (ATIVAN) tablet 1-4 mg (has no administration in time range)    Or  LORazepam (ATIVAN) injection 1-4 mg (has no administration in time range)  thiamine tablet 100 mg (has no administration in time range)    Or  thiamine (B-1) injection 100 mg (has no administration in time range)  folic acid (FOLVITE) tablet 1 mg (has no administration in time range)  multivitamin with minerals tablet 1 tablet (has no administration in time range)  methylPREDNISolone sodium succinate (SOLU-MEDROL) 40 mg/mL injection 40 mg (has no administration in time range)  lactated ringers bolus 1,000 mL (0 mLs Intravenous Stopped 04/09/21 1257)  ondansetron (ZOFRAN) injection 4 mg (4 mg Intravenous Given 04/09/21 1148)  LORazepam (ATIVAN) injection 1 mg (1 mg Intravenous Given 04/09/21 1149)  buprenorphine-naloxone (SUBOXONE) 2-0.5 mg per SL tablet 2 tablet (2 tablets Sublingual Given 04/09/21 1234)  cloNIDine (CATAPRES) tablet 0.1 mg (0.1 mg Oral Given 04/09/21 1234)  magnesium sulfate IVPB 2 g 50 mL (0 g  Intravenous Stopped 04/09/21 1723)  iohexol (OMNIPAQUE) 350 MG/ML injection 100 mL (80 mLs Intravenous Contrast Given 04/09/21 1627)  chlordiazePOXIDE (LIBRIUM) capsule 50 mg (50 mg Oral Given 04/09/21 1724)  LORazepam (ATIVAN) injection 1 mg (1 mg Intravenous Given 04/09/21 1725)    ED Course  I have reviewed the triage vital signs and the nursing notes.  Pertinent labs & imaging results that were available during my care of the patient were reviewed by me and considered in my medical decision making (see chart for details).    MDM Rules/Calculators/A&P                           Patient is a 50 year old female with history of polysubstance abuse, presenting for chills, nausea, vomiting, and p.o. intolerance.  Current alcohol consumption is 1-1/2 of a feta liquor per day.  Last drink was 4 hours prior to arrival.  On arrival, patient tachycardic and hypertensive.  On exam, she is anxious and tremulous.  She does have some epigastric tenderness.  She denies any blood in her vomit.  Patient symptoms consistent with alcohol and/or Suboxone withdrawal.  Diagnostic work-up initiated before assessment of possible underlying infectious etiology as well as possible metabolic disturbances due to her her vomiting and p.o. intolerance.  Given her p.o. intolerance, bolus of IV fluids ordered.  1 mg Ativan ordered for treatment of alcohol withdrawal.  Zofran ordered for symptomatic relief of nausea.  Home dose of Suboxone was ordered.  Patient also typically wears a clonidine patch, which she removed yesterday.  0.1 mg dose of clonidine was ordered.  On reassessment, patient reported that she felt slightly better.  Blood pressure was improved.  Labs were notable for hypokalemia.  Replacement potassium and magnesium were given.  Lipase was elevated at 319.  There are no previous lipase levels available in EMR.  High suspicion for pancreatitis, given the patient's epigastric pain and tenderness.  Additionally, her  bilirubin was elevated compared to recent lab work.  Etiology of her pain, discomfort, nausea, and vomiting, could also be related to biliary obstruction.  Of note, patient had a right upper quadrant ultrasound 2 days ago which showed fatty liver with a gallbladder that was normal in appearance.  CT scan of abdomen pelvis was ordered to assess for possible intrahepatic or intrapancreatic biliary abnormality.  Other lab results are consistent with chronic alcoholism (macrocytosis, AST elevation).  On further reassessment, patient reports continued improvement in her pain and nausea.  She was able to tolerate drinking some fluids.  She stated, at this time, she had return of appetite.  She was advised to avoid anything but clear liquids at this time, given strong possibility of pancreatitis.  On this reassessment, patient noted to have recurrence of tremulousness.  50 mg of Librium and 1 mg of Ativan given.  Patient does state that she wishes to quit alcohol.  She is having worsening difficulty with maintaining a job in the home, given her alcoholism.  Patient does state that she would like to be admitted for prevention of severe alcohol withdrawal.  At this time, results of CT scan were pending.  Care of patient was signed out to oncoming ED provider.  Final Clinical Impression(s) / ED Diagnoses Final diagnoses:  Epigastric pain  Nausea  Non-intractable vomiting with nausea, unspecified vomiting type  Elevated lipase    Rx / DC Orders ED Discharge Orders     None        Godfrey Pick, MD 04/09/21 1906

## 2021-04-09 NOTE — ED Notes (Signed)
Rupinder Toy Care, RN has been sent a secure message in regards to having an admission bed assignment.  Awaiting for her to approve

## 2021-04-09 NOTE — ED Notes (Signed)
Willis Modena, RN was sent a secure message in regards to the patient having an admission bed assignment [phone]

## 2021-04-10 ENCOUNTER — Inpatient Hospital Stay (HOSPITAL_COMMUNITY): Payer: Medicaid Other

## 2021-04-10 LAB — COMPREHENSIVE METABOLIC PANEL
ALT: 33 U/L (ref 0–44)
AST: 83 U/L — ABNORMAL HIGH (ref 15–41)
Albumin: 2.4 g/dL — ABNORMAL LOW (ref 3.5–5.0)
Alkaline Phosphatase: 209 U/L — ABNORMAL HIGH (ref 38–126)
Anion gap: 9 (ref 5–15)
BUN: 13 mg/dL (ref 6–20)
CO2: 25 mmol/L (ref 22–32)
Calcium: 8 mg/dL — ABNORMAL LOW (ref 8.9–10.3)
Chloride: 100 mmol/L (ref 98–111)
Creatinine, Ser: 0.79 mg/dL (ref 0.44–1.00)
GFR, Estimated: >60 mL/min (ref >60)
Glucose, Bld: 124 mg/dL — ABNORMAL HIGH (ref 70–99)
Potassium: 3.5 mmol/L (ref 3.5–5.1)
Sodium: 134 mmol/L — ABNORMAL LOW (ref 135–145)
Total Bilirubin: 1.9 mg/dL — ABNORMAL HIGH (ref 0.3–1.2)
Total Protein: 5.5 g/dL — ABNORMAL LOW (ref 6.5–8.1)

## 2021-04-10 LAB — CBC
HCT: 33.6 % — ABNORMAL LOW (ref 36.0–46.0)
Hemoglobin: 11.5 g/dL — ABNORMAL LOW (ref 12.0–15.0)
MCH: 35.5 pg — ABNORMAL HIGH (ref 26.0–34.0)
MCHC: 34.2 g/dL (ref 30.0–36.0)
MCV: 103.7 fL — ABNORMAL HIGH (ref 80.0–100.0)
Platelets: 161 10*3/uL (ref 150–400)
RBC: 3.24 MIL/uL — ABNORMAL LOW (ref 3.87–5.11)
RDW: 13.1 % (ref 11.5–15.5)
WBC: 4.8 10*3/uL (ref 4.0–10.5)
nRBC: 0 % (ref 0.0–0.2)

## 2021-04-10 LAB — MAGNESIUM: Magnesium: 1.8 mg/dL (ref 1.7–2.4)

## 2021-04-10 LAB — PHOSPHORUS: Phosphorus: 4.3 mg/dL (ref 2.5–4.6)

## 2021-04-10 LAB — LIPID PANEL
Cholesterol: 167 mg/dL (ref 0–200)
HDL: 79 mg/dL (ref >40)
LDL Cholesterol: 79 mg/dL (ref 0–99)
Total CHOL/HDL Ratio: 2.1 RATIO
Triglycerides: 46 mg/dL (ref <150)
VLDL: 9 mg/dL (ref 0–40)

## 2021-04-10 LAB — TSH: TSH: 0.242 u[IU]/mL — ABNORMAL LOW (ref 0.350–4.500)

## 2021-04-10 LAB — SEDIMENTATION RATE: Sed Rate: 28 mm/hr — ABNORMAL HIGH (ref 0–22)

## 2021-04-10 LAB — C-REACTIVE PROTEIN: CRP: 7.8 mg/dL — ABNORMAL HIGH (ref <1.0)

## 2021-04-10 LAB — HIV ANTIBODY (ROUTINE TESTING W REFLEX): HIV Screen 4th Generation wRfx: NONREACTIVE

## 2021-04-10 LAB — LIPASE, BLOOD: Lipase: 272 U/L — ABNORMAL HIGH (ref 11–51)

## 2021-04-10 LAB — URIC ACID: Uric Acid, Serum: 5.1 mg/dL (ref 2.5–7.1)

## 2021-04-10 MED ORDER — POTASSIUM CHLORIDE CRYS ER 10 MEQ PO TBCR
40.0000 meq | EXTENDED_RELEASE_TABLET | Freq: Once | ORAL | Status: AC
  Administered 2021-04-10: 40 meq via ORAL
  Filled 2021-04-10: qty 4

## 2021-04-10 MED ORDER — NICOTINE 21 MG/24HR TD PT24
21.0000 mg | MEDICATED_PATCH | Freq: Every day | TRANSDERMAL | Status: DC
  Administered 2021-04-10 – 2021-04-14 (×5): 21 mg via TRANSDERMAL
  Filled 2021-04-10 (×5): qty 1

## 2021-04-10 MED ORDER — BUPRENORPHINE HCL 8 MG SL SUBL
4.0000 mg | SUBLINGUAL_TABLET | Freq: Every day | SUBLINGUAL | Status: DC
  Administered 2021-04-10 – 2021-04-14 (×5): 4 mg via SUBLINGUAL
  Filled 2021-04-10 (×5): qty 1

## 2021-04-10 MED ORDER — GADOBUTROL 1 MMOL/ML IV SOLN
5.0000 mL | Freq: Once | INTRAVENOUS | Status: AC | PRN
  Administered 2021-04-10: 5 mL via INTRAVENOUS

## 2021-04-10 MED ORDER — GABAPENTIN 300 MG PO CAPS
300.0000 mg | ORAL_CAPSULE | Freq: Three times a day (TID) | ORAL | Status: DC
  Administered 2021-04-10 – 2021-04-14 (×10): 300 mg via ORAL
  Filled 2021-04-10 (×11): qty 1

## 2021-04-10 NOTE — Progress Notes (Signed)
Please note pt's children contact numbers:  Mikaya Bunner [phone] (son)--legal next of kin  Chandani Rogowski [phone] (daughter)  Please alert to children any issues that may arise with patient.

## 2021-04-10 NOTE — Consult Note (Signed)
Referring Provider:  Petronila Primary Care Physician:  Kerin Perna, NP Primary Gastroenterologist: Previous patient of Dr. Arelia Longest who did outpatient colonoscopy in 2004  Reason for Consultation: Abnormal CT scan, abdominal pain  HPI: [name] is a 50 y.o. female with past medical history of alcohol use presented to the hospital yesterday with abdominal pain, nausea and vomiting of 2 to 3  weeks duration.  Upon initial evaluation in the emergency room, she was found to have mildly abnormal LFTs with AST of 99, normal ALT, alk phos 262 and T bili of 2.0.  Normal CBC.  Lipase 319.  Ultrasound abdomen right upper quadrant in April 07, 2021 for evaluation of abnormal LFTs showed CBD of 8 mm and fatty liver.  CT abdomen pelvis with contrast yesterday showed intra and extrahepatic ductal dilation with CBD of 12 mm.  No evidence of pancreatitis.  Patient seen and examined at bedside.  Patient with history of significant alcohol use.  Started having epigastric pain radiating to back around 2 to 3 weeks ago along with nausea and vomiting.  She had some chills but denied any fever.  History of intermittent diarrhea.  No bowel movement in last 2 days.  Denies any blood in the stool or black stool.   Colonoscopy in 2004 by Dr. Autumn Patty for evaluation of diarrhea and family history of Crohn's disease showed small polyp and hemorrhoids.  Pathology showed benign hyperplastic polyp.  Biopsies were negative for colitis or ileitis   Past Medical History:  Diagnosis Date   Alcohol dependence ([place])    Hypertension    Kidney stones     Past Surgical History:  Procedure Laterality Date   LEEP     LITHOTRIPSY     TUBAL LIGATION      Prior to Admission medications   Medication Sig Start Date End Date Taking? Authorizing Provider  ondansetron (ZOFRAN ODT) 4 MG disintegrating tablet Take 1 tablet (4 mg total) by mouth every 8 (eight) hours as needed for nausea or vomiting. 03/24/21  Yes Kerin Perna, NP  Buprenorphine HCl-Naloxone HCl (SUBOXONE SL) Place under the tongue.    [provider]  cloNIDine (CATAPRES - DOSED IN MG/24 HR) 0.2 mg/24hr patch Place 1 patch (0.2 mg total) onto the skin once a week. 03/24/21   Kerin Perna, NP  erythromycin ophthalmic ointment Place a 1/2 inch ribbon of ointment into the lower eyelid four times daily for 5 days. 05/14/16   Frederica Kuster, PA-C  hydrALAZINE (APRESOLINE) 50 MG tablet Take 1/2 tablet (25 mg total) by mouth once a day as needed. 01/08/21 03/09/21  Alfredia Client, PA-C  HYDROcodone-acetaminophen (NORCO/VICODIN) 5-325 MG tablet Take 1 tablet by mouth every 4 (four) hours as needed. 05/13/19   Tegeler, Gwenyth Allegra, MD  potassium chloride SA (KLOR-CON) 20 MEQ tablet Take 1 tablet (20 mEq total) by mouth daily. 04/01/21   Charlott Rakes, MD    Scheduled Meds:  cloNIDine  0.2 mg Transdermal Weekly   folic acid  1 mg Oral Daily   multivitamin with minerals  1 tablet Oral Daily   nicotine  21 mg Transdermal Once   potassium chloride  40 mEq Oral BID   sodium chloride flush  3 mL Intravenous Q12H   thiamine  100 mg Oral Daily   Or   thiamine  100 mg Intravenous Daily   Continuous Infusions:  sodium chloride 200 mL/hr at 04/10/21 0454   PRN Meds:.HYDROmorphone (DILAUDID) injection, LORazepam **OR** LORazepam, ondansetron **  OR** ondansetron (ZOFRAN) IV  Allergies as of 04/09/2021 - Review Complete 04/09/2021  Allergen Reaction Noted   Penicillins Hives 05/14/2016    Family History  Problem Relation Age of Onset   Thyroid disease Mother     Social History   Socioeconomic History   Marital status: Divorced    Spouse name: Not on file   Number of children: Not on file   Years of education: Not on file   Highest education level: Not on file  Occupational History   Not on file  Tobacco Use   Smoking status: Every Day    Packs/day: 1.00    Types: Cigarettes   Smokeless tobacco: Never  Substance and Sexual  Activity   Alcohol use: Yes    Comment: 1/5 a fifth daily   Drug use: Not Currently   Sexual activity: Not on file  Other Topics Concern   Not on file  Social History Narrative   Not on file   Social Determinants of Health   Financial Resource Strain: Not on file  Food Insecurity: Not on file  Transportation Needs: Not on file  Physical Activity: Not on file  Stress: Not on file  Social Connections: Not on file  Intimate Partner Violence: Not on file    Review of Systems: 12 point review of system is done which is negative except as mentioned in HPI.  Complaining of left foot pain which is chronic.  Physical Exam: Vital signs: Vitals:   04/10/21 0027 04/10/21 0425  BP: (!) 141/91 (!) 141/92  Pulse: 74 65  Resp: 18 20  Temp: 98.8 F (37.1 C) 98.8 F (37.1 C)  SpO2: 100% 96%   Last BM Date: 04/09/21 Physical Exam Vitals reviewed.  Constitutional:      General: She is not in acute distress.    Appearance: She is not ill-appearing.  HENT:     Head: Normocephalic.     Mouth/Throat:     Mouth: Mucous membranes are moist.     Pharynx: Oropharynx is clear. No oropharyngeal exudate.  Eyes:     Extraocular Movements: Extraocular movements intact.  Cardiovascular:     Rate and Rhythm: Normal rate and regular rhythm.     Heart sounds: Normal heart sounds.  Pulmonary:     Effort: Pulmonary effort is normal. No respiratory distress.  Abdominal:     General: Bowel sounds are normal. There is no distension.     Palpations: Abdomen is soft.     Tenderness: There is abdominal tenderness in the epigastric area. There is no guarding or rebound.     Hernia: No hernia is present.  Skin:    General: Skin is warm.     Coloration: Skin is not jaundiced.  Neurological:     Mental Status: She is alert and oriented to person, place, and time.  Psychiatric:        Mood and Affect: Mood is anxious. Mood is not depressed.        Behavior: Behavior normal.     GI:  Lab  Results: Recent Labs    04/09/21 1109 04/10/21 0525  WBC 7.6 4.8  HGB 14.8 11.5*  HCT 41.7 33.6*  PLT 198 161   BMET Recent Labs    04/09/21 1109 04/10/21 0525  NA 137 134*  K 2.8* 3.5  CL 96* 100  CO2 24 25  GLUCOSE 121* 124*  BUN 12 13  CREATININE 0.92 0.79  CALCIUM 9.4 8.0*   LFT Recent Labs  04/10/21 0525  PROT 5.5*  ALBUMIN 2.4*  AST 83*  ALT 33  ALKPHOS 209*  BILITOT 1.9*   PT/INR No results for input(s): LABPROT, INR in the last 72 hours.   Studies/Results: CT ABDOMEN PELVIS W CONTRAST  Result Date: 04/09/2021 CLINICAL DATA:  Nausea/vomiting Epigastric pain EXAM: CT ABDOMEN AND PELVIS WITH CONTRAST TECHNIQUE: Multidetector CT imaging of the abdomen and pelvis was performed using the standard protocol following bolus administration of intravenous contrast. CONTRAST:  34m OMNIPAQUE IOHEXOL 350 MG/ML SOLN COMPARISON:  Ultrasound 04/07/2021, CT abdomen pelvis 04/24/2009 FINDINGS: Lower chest: Left basilar linear atelectasis. Hepatobiliary: No focal liver abnormality. Likely perfusional anomaly in the quadrate. The gallbladder is unremarkable. There is mild intrahepatic and extrahepatic ductal dilation with common duct measuring up to 1.2 cm. Pancreas: Unremarkable. No pancreatic ductal dilatation or surrounding inflammatory changes. Spleen: Normal in size without focal abnormality. Adrenals/Urinary Tract: Adrenal glands are unremarkable. Multifocal right renal cortical scarring. No hydronephrosis. The left kidney is unremarkable with exception of multiple small subcentimeter renal cysts. No nephroureterolithiasis. The bladder is well distended and unremarkable. Stomach/Bowel: Stomach is within normal limits. There is no evidence of bowel obstruction. Prior appendectomy. Vascular/Lymphatic: Aortoiliac atherosclerotic calcifications. No AAA. No lymphadenopathy. Reproductive: Unremarkable. Other: No abdominal wall hernia or abnormality. No abdominopelvic ascites.  Musculoskeletal: No acute or significant osseous findings. IMPRESSION: Mild intra and extrahepatic ductal dilation with common duct measuring up to 1.2 cm. Correlate with laboratory findings and consider MRCP if clinically indicated. No other acute findings.  Multifocal right renal cortical scarring. Electronically Signed   By: JMaurine SimmeringM.D.   On: 04/09/2021 17:13   DG Chest Portable 1 View  Result Date: 04/09/2021 CLINICAL DATA:  chest heaviness EXAM: PORTABLE CHEST 1 VIEW COMPARISON:  10/06/2009, 02/08/2009 FINDINGS: The heart size and mediastinal contours are within normal limits. Bilateral nipple shadows, as seen on prior studies. Both lungs are clear. The visualized skeletal structures are unremarkable. IMPRESSION: No active disease. Electronically Signed   By: NDavina PokeD.O.   On: 04/09/2021 12:20    Impression/Plan: Epigastric abdominal pain radiating to back with mild elevated lipase.  Although CT scan negative for pancreatitis clinical presentation is concerning for mild pancreatitis. -Abdominal pain improving. -Nausea and vomiting.  Resolved -Dilated CBD. ?  Passed CBD stone.  Also ultrasound was negative for any gallstones. -Abnormal LFTs.  Could be from alcohol use.  Recommendations ------------------------- -Proceed with MRI MRCP -Okay to have full liquid diet after MRI -Absolute alcohol abstinence discussed with the patient -Monitor LFTs -GI will follow    LOS: 1 day   POtis Brace MD, FACP 04/10/2021, 8:27 AM  Contact #  [phone]

## 2021-04-10 NOTE — Progress Notes (Addendum)
PROGRESS NOTE    [name]  WUJ:[id number] DOB: 1971/06/16 DOA: 04/09/2021 PCP: Kerin Perna, NP    Brief Narrative:  [name] is a 50 year old female with past medical history significant for essential hypertension, EtOH use disorder who presented to [facility] ED on 8/19 with nausea/vomiting, abdominal pain, chills and poor oral intake.  Patient reports nausea and vomiting that has been intermittent over the last 3 weeks.  Patient reports that she drinks about half 1/5 liquor per day, last drink was morning of ED presentation.  Also endorses smokes about 1 pack/day.  Abdominal pain localized to the epigastric region.  In the ED, temperature 98.2 F, HR 96, RR 22, BP 159/120, SPO2 100% on room air.  Sodium 137, potassium 2.8, chloride 96, CO2 24, glucose 121, BUN 12, creatinine 0.92.  Anion gap 17, alkaline phosphatase 262, albumin 3.2, lipase 319, AST 99, ALT 41, total bilirubin 2.0.  High sensitive troponin 15.  WBC 7.6, hemoglobin 14.8, platelets 198.  COVID-19 PCR negative.  Influenza A/B PCR negative.  Urinalysis with moderate leukocytes, negative nitrite, rare bacteria, 0-5 WBCs.  Chest x-ray with no active cardiopulmonary disease process.  CT abdomen/pelvis with mild intra/extrahepatic ductal dilation with common bile duct measuring 1.2 cm; no other acute findings.  EDP consulted [facility] for further evaluation management of nausea/vomiting, abdominal pain secondary to pancreatitis.   Assessment & Plan:   Principal Problem:   Gallstone pancreatitis Active Problems:   Gout   Dilated cbd, acquired   Alcohol dependence with uncomplicated withdrawal (HCC)   Chronic pain syndrome   Benign essential HTN   Acute pancreatitis, mild Patient presenting to the ED with 3-week history of intermittent nausea/vomiting associated with epigastric pain.  Patient was noted to have elevated lipase of 319 with notable intra/extrahepatic ductal dilation with CBD dilation of 1.2 cm.  Etiology  likely secondary to history of chronic active alcohol abuse. --[facility] following; appreciate assistance --MRCP: Pending --Start full liquid diet following MRCP --Dilaudid 0.5-1 mg IV every 2 hours as needed moderate/severe pain --NS at 200 mL/h  Transaminitis CT abdomen/pelvis notable for intra/extrahepatic ductal dilation with common bile duct 1.2 cm, no gallstones appreciated within the gallbladder.  [name] likely secondary to alcohol abuse. --MRCP: Pending --AST 99>83 --ALT 41>33 --Tbili 2.0>1.9 --follow CMP daily  Hypokalemia Potassium 2.8 on presentation.  Etiology likely from poor oral intake in the setting of nausea/vomiting.  Repleted. --K 3.5, will continue repletion today  Essential hypertension BP 141/92 this morning. --Clonidine 0.2 transdermal weekly  Chronic pain syndrome --Continue Suboxone 4 mg p.o. daily --PT evaluation  EtOH use disorder Patient reports significant alcohol use, has been trying to decrease amount/hard liquor she has been utilizing.  Patient does report tremors/shakes when no alcohol use.  Last alcohol use was a day of ED presentation on 8/19.  Denies any history of seizures.  Told on need for complete cessation. --CIWAA protocol with symptom triggered Ativan --Thiamine, multivitamin, folic acid --Monitor on telemetry  Tobacco use disorder: Counseled on need for complete cessation  Moderate protein calorie malnutrition Body mass index is 17.85 kg/m.  Patient very thin in appearance with muscle wasting/fat loss.  Likely secondary to EtOH dependence. --nutrition consult    DVT prophylaxis: SCDs Start: 04/09/21 2016   Code Status: Full Code Family Communication: No family present at bedside this morning  Disposition Plan:  Level of care: Telemetry Status is: Inpatient  Remains inpatient appropriate because:Ongoing diagnostic testing needed not appropriate for outpatient work up,  Unsafe d/c plan, IV treatments appropriate due to  intensity of illness or inability to take PO, and Inpatient level of care appropriate due to severity of illness  Dispo: The patient is from: Home              Anticipated d/c is to: Home              Patient currently is not medically stable to d/c.   Difficult to place patient No   Consultants:  [facility]  Procedures:  MRCP  Antimicrobials:  None   Subjective: Patient seen examined at bedside, resting comfortably.  Slightly emotional given that she feels like she is going to "lose everything".  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  Endorses heavy alcohol use to control her symptoms/pain.  Patient is hungry and wishes to eat something this morning.  Discussed needs MRCP prior to starting diet.  Seen by gastroenterology this morning with okay for full liquids following MRCP.  Feels her left foot pain is improved.  No other questions or concerns at this time.  Objective: Vitals:   04/10/21 0027 04/10/21 0425 04/10/21 0822 04/10/21 1205  BP: (!) 141/91 (!) 141/92 (!) 149/92 (!) 145/100  Pulse: 74 65 65 68  Resp: 18 20 18 19   Temp: 98.8 F (37.1 C) 98.8 F (37.1 C) 98.5 F (36.9 C) 100.1 F (37.8 C)  TempSrc: Oral Oral Oral   SpO2: 100% 96%  100%  Weight:      Height:        Intake/Output Summary (Last 24 hours) at 04/10/2021 1315 Last data filed at 04/10/2021 0900 Gross per 24 hour  Intake 50 ml  Output --  Net 50 ml   Filed Weights   04/09/21 1044 04/09/21 1052  Weight: 51.7 kg 51.7 kg    Examination:  General exam: Appears anxious, no acute distress, thin in appearance, appears older than stated age Respiratory system: Clear to auscultation. Respiratory effort normal.  On room air Cardiovascular system: S1 & S2 heard, RRR. No JVD, murmurs, rubs, gallops or clicks. No pedal edema. Gastrointestinal system: Abdomen is slightly tender to epigastric region, nondistended, soft. No organomegaly or masses felt. Normal  bowel sounds heard. Central nervous system: Alert and oriented. No focal neurological deficits. Extremities: Symmetric 5 x 5 power. Skin: No rashes, lesions or ulcers Psychiatry: Judgement and insight appear poor.  Anxious mood; affect appropriate.     Data Reviewed: I have personally reviewed following labs and imaging studies  CBC: Recent Labs  Lab 04/09/21 1109 04/10/21 0525  WBC 7.6 4.8  NEUTROABS 6.0  --   HGB 14.8 11.5*  HCT 41.7 33.6*  MCV 102.5* 103.7*  PLT 198 128   Basic Metabolic Panel: Recent Labs  Lab 04/09/21 1109 04/10/21 0525  NA 137 134*  K 2.8* 3.5  CL 96* 100  CO2 24 25  GLUCOSE 121* 124*  BUN 12 13  CREATININE 0.92 0.79  CALCIUM 9.4 8.0*  MG  --  1.8  PHOS  --  4.3   GFR: Estimated Creatinine Clearance: 67.9 mL/min (by C-G formula based on SCr of 0.79 mg/dL). Liver Function Tests: Recent Labs  Lab 04/09/21 1109 04/10/21 0525  AST 99* 83*  ALT 41 33  ALKPHOS 262* 209*  BILITOT 2.0* 1.9*  PROT 7.4 5.5*  ALBUMIN 3.2* 2.4*   Recent Labs  Lab 04/09/21 1109 04/10/21 0525  LIPASE 319* 272*   No results for  input(s): AMMONIA in the last 168 hours. Coagulation Profile: No results for input(s): INR, PROTIME in the last 168 hours. Cardiac Enzymes: No results for input(s): CKTOTAL, CKMB, CKMBINDEX, TROPONINI in the last 168 hours. BNP (last 3 results) No results for input(s): PROBNP in the last 8760 hours. HbA1C: No results for input(s): HGBA1C in the last 72 hours. CBG: No results for input(s): GLUCAP in the last 168 hours. Lipid Profile: No results for input(s): CHOL, HDL, LDLCALC, TRIG, CHOLHDL, LDLDIRECT in the last 72 hours. Thyroid Function Tests: Recent Labs    04/10/21 0525  TSH 0.242*   Anemia Panel: No results for input(s): VITAMINB12, FOLATE, FERRITIN, TIBC, IRON, RETICCTPCT in the last 72 hours. Sepsis Labs: No results for input(s): PROCALCITON, LATICACIDVEN in the last 168 hours.  Recent Results (from the past 240  hour(s))  Resp Panel by RT-PCR (Flu A&B, Covid) Nasopharyngeal Swab     Status: None   Collection Time: 04/09/21  5:32 PM   Specimen: Nasopharyngeal Swab; Nasopharyngeal(NP) swabs in vial transport medium  Result Value Ref Range Status   SARS Coronavirus 2 by RT PCR NEGATIVE NEGATIVE Final    Comment: (NOTE) SARS-CoV-2 target nucleic acids are NOT DETECTED.  The SARS-CoV-2 RNA is generally detectable in upper respiratory specimens during the acute phase of infection. The lowest concentration of SARS-CoV-2 viral copies this assay can detect is 138 copies/mL. A negative result does not preclude SARS-Cov-2 infection and should not be used as the sole basis for treatment or other patient management decisions. A negative result may occur with  improper specimen collection/handling, submission of specimen other than nasopharyngeal swab, presence of viral mutation(s) within the areas targeted by this assay, and inadequate number of viral copies(<138 copies/mL). A negative result must be combined with clinical observations, patient history, and epidemiological information. The expected result is Negative.  Fact Sheet for Patients:  EntrepreneurPulse.com.au  Fact Sheet for Healthcare Providers:  IncredibleEmployment.be  This test is no t yet approved or cleared by the [place] FDA and  has been authorized for detection and/or diagnosis of SARS-CoV-2 by FDA under an Emergency Use Authorization (EUA). This EUA will remain  in effect (meaning this test can be used) for the duration of the COVID-19 declaration under Section 564(b)(1) of the Act, 21 U.S.C.section 360bbb-3(b)(1), unless the authorization is terminated  or revoked sooner.       Influenza A by PCR NEGATIVE NEGATIVE Final   Influenza B by PCR NEGATIVE NEGATIVE Final    Comment: (NOTE) The Xpert Xpress SARS-CoV-2/FLU/RSV plus assay is intended as an aid in the diagnosis of influenza from  Nasopharyngeal swab specimens and should not be used as a sole basis for treatment. Nasal washings and aspirates are unacceptable for Xpert Xpress SARS-CoV-2/FLU/RSV testing.  Fact Sheet for Patients: EntrepreneurPulse.com.au  Fact Sheet for Healthcare Providers: IncredibleEmployment.be  This test is not yet approved or cleared by the [place] FDA and has been authorized for detection and/or diagnosis of SARS-CoV-2 by FDA under an Emergency Use Authorization (EUA). This EUA will remain in effect (meaning this test can be used) for the duration of the COVID-19 declaration under Section 564(b)(1) of the Act, 21 U.S.C. section 360bbb-3(b)(1), unless the authorization is terminated or revoked.  Performed at [facility], [place] [address] [place], [place] [address]          Radiology Studies: CT ABDOMEN PELVIS W CONTRAST  Result Date: 04/09/2021 CLINICAL DATA:  Nausea/vomiting Epigastric pain EXAM: CT ABDOMEN AND PELVIS WITH CONTRAST  TECHNIQUE: Multidetector CT imaging of the abdomen and pelvis was performed using the standard protocol following bolus administration of intravenous contrast. CONTRAST:  64m OMNIPAQUE IOHEXOL 350 MG/ML SOLN COMPARISON:  Ultrasound 04/07/2021, CT abdomen pelvis 04/24/2009 FINDINGS: Lower chest: Left basilar linear atelectasis. Hepatobiliary: No focal liver abnormality. Likely perfusional anomaly in the quadrate. The gallbladder is unremarkable. There is mild intrahepatic and extrahepatic ductal dilation with common duct measuring up to 1.2 cm. Pancreas: Unremarkable. No pancreatic ductal dilatation or surrounding inflammatory changes. Spleen: Normal in size without focal abnormality. Adrenals/Urinary Tract: Adrenal glands are unremarkable. Multifocal right renal cortical scarring. No hydronephrosis. The left kidney is unremarkable with exception of multiple small subcentimeter renal cysts. No  nephroureterolithiasis. The bladder is well distended and unremarkable. Stomach/Bowel: Stomach is within normal limits. There is no evidence of bowel obstruction. Prior appendectomy. Vascular/Lymphatic: Aortoiliac atherosclerotic calcifications. No AAA. No lymphadenopathy. Reproductive: Unremarkable. Other: No abdominal wall hernia or abnormality. No abdominopelvic ascites. Musculoskeletal: No acute or significant osseous findings. IMPRESSION: Mild intra and extrahepatic ductal dilation with common duct measuring up to 1.2 cm. Correlate with laboratory findings and consider MRCP if clinically indicated. No other acute findings.  Multifocal right renal cortical scarring. Electronically Signed   By: JMaurine SimmeringM.D.   On: 04/09/2021 17:13   DG Chest Portable 1 View  Result Date: 04/09/2021 CLINICAL DATA:  chest heaviness EXAM: PORTABLE CHEST 1 VIEW COMPARISON:  10/06/2009, 02/08/2009 FINDINGS: The heart size and mediastinal contours are within normal limits. Bilateral nipple shadows, as seen on prior studies. Both lungs are clear. The visualized skeletal structures are unremarkable. IMPRESSION: No active disease. Electronically Signed   By: NDavina PokeD.O.   On: 04/09/2021 12:20        Scheduled Meds:  buprenorphine  4 mg Sublingual Daily   cloNIDine  0.2 mg Transdermal Weekly   folic acid  1 mg Oral Daily   multivitamin with minerals  1 tablet Oral Daily   nicotine  21 mg Transdermal Once   sodium chloride flush  3 mL Intravenous Q12H   thiamine  100 mg Oral Daily   Or   thiamine  100 mg Intravenous Daily   Continuous Infusions:  sodium chloride 200 mL/hr at 04/10/21 0454     LOS: 1 day    Time spent: 39 minutes spent on chart review, discussion with nursing staff, consultants, updating family and interview/physical exam; more than 50% of that time was spent in counseling and/or coordination of care.    Daje Stark J [place] DO [facility] Available via Epic secure chat  7am-7pm After these hours, please refer to coverage provider listed on [email or web address] 04/10/2021, 1:15 PM

## 2021-04-11 LAB — COMPREHENSIVE METABOLIC PANEL
ALT: 29 U/L (ref 0–44)
AST: 55 U/L — ABNORMAL HIGH (ref 15–41)
Albumin: 2.1 g/dL — ABNORMAL LOW (ref 3.5–5.0)
Alkaline Phosphatase: 176 U/L — ABNORMAL HIGH (ref 38–126)
Anion gap: 6 (ref 5–15)
BUN: 13 mg/dL (ref 6–20)
CO2: 23 mmol/L (ref 22–32)
Calcium: 7.7 mg/dL — ABNORMAL LOW (ref 8.9–10.3)
Chloride: 105 mmol/L (ref 98–111)
Creatinine, Ser: 0.69 mg/dL (ref 0.44–1.00)
GFR, Estimated: >60 mL/min (ref >60)
Glucose, Bld: 89 mg/dL (ref 70–99)
Potassium: 3.7 mmol/L (ref 3.5–5.1)
Sodium: 134 mmol/L — ABNORMAL LOW (ref 135–145)
Total Bilirubin: 1.1 mg/dL (ref 0.3–1.2)
Total Protein: 4.8 g/dL — ABNORMAL LOW (ref 6.5–8.1)

## 2021-04-11 LAB — MAGNESIUM: Magnesium: 1.5 mg/dL — ABNORMAL LOW (ref 1.7–2.4)

## 2021-04-11 MED ORDER — MAGNESIUM SULFATE 4 GM/100ML IV SOLN
4.0000 g | Freq: Once | INTRAVENOUS | Status: AC
  Administered 2021-04-11: 4 g via INTRAVENOUS
  Filled 2021-04-11: qty 100

## 2021-04-11 MED ORDER — PREDNISONE 20 MG PO TABS
40.0000 mg | ORAL_TABLET | Freq: Every day | ORAL | Status: DC
  Administered 2021-04-11 – 2021-04-14 (×4): 40 mg via ORAL
  Filled 2021-04-11 (×4): qty 2

## 2021-04-11 MED ORDER — ENSURE ENLIVE PO LIQD
237.0000 mL | Freq: Three times a day (TID) | ORAL | Status: DC
  Administered 2021-04-11 – 2021-04-14 (×10): 237 mL via ORAL

## 2021-04-11 NOTE — Evaluation (Signed)
Physical Therapy Evaluation Patient Details Name: [name] MRN: [id number] DOB: 1971-07-26 Today's Date: 04/11/2021   History of Present Illness  Pt admitted from home with N/V and abdominal pain and dx with Gallstone Pancreatitis, Transaminitis, and hypokalemia.  Pt with hx of htn, chronic pain syndrome, protein calorie malnutrition and ETOH abuse.  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  Clinical Impression  Pt admitted as above and presenting with functional mobility limitations 2* generalized weakness, limited endurance, ambulatory balance deficits and ongoing L ankle pain/edema.  Pt should progress to dc home with intermittent assist of family/friends.    Follow Up Recommendations Home health PT (dependent on acute stay progress)    Equipment Recommendations   (TBD)    Recommendations for Other Services OT consult     Precautions / Restrictions Precautions Precautions: Fall Required Braces or Orthoses:  (uses brace on L ankle at home as needed but not in room) Restrictions Weight Bearing Restrictions: No      Mobility  Bed Mobility Overal bed mobility: Modified Independent             General bed mobility comments: No physical assist to move to EOB sitting    Transfers Overall transfer level: Needs assistance Equipment used: Rolling walker (2 wheeled) Transfers: Sit to/from Stand Sit to Stand: Min guard         General transfer comment: Steady assist with cues for LE management and use of UEs to self assist  Ambulation/Gait Ambulation/Gait assistance: Min assist;Min guard Gait Distance (Feet): 125 Feet Assistive device: Rolling walker (2 wheeled) Gait Pattern/deviations: Step-through pattern;Shuffle;Decreased stance time - left;Trunk flexed Gait velocity: decr   General Gait Details: Cues for sequence, posture and  position from RW.  Distance ltd by fatigue and increasing L ankle pain  Stairs            Wheelchair Mobility    Modified Rankin (Stroke Patients Only)       Balance Overall balance assessment: Needs assistance Sitting-balance support: No upper extremity supported;Feet supported Sitting balance-Leahy Scale: Good     Standing balance support: No upper extremity supported Standing balance-Leahy Scale: Fair                               Pertinent Vitals/Pain Pain Assessment: 0-10 Pain Score: 6  Pain Location: L ankle with increased time in WB Pain Descriptors / Indicators: Aching;Sore Pain Intervention(s): Limited activity within patient's tolerance;Monitored during session;Ice applied    Home Living Family/patient expects to be discharged to:: Private residence Living Arrangements: Alone Available Help at Discharge: Available PRN/intermittently;Friend(s);Family Type of Home: House Home Access: Stairs to enter Entrance Stairs-Rails: Right Entrance Stairs-Number of Steps: 3 Home Layout: One level Home Equipment: Cane - single point;Crutches      Prior Function Level of Independence: Independent         Comments: working as a Music therapist        Extremity/Trunk Assessment   Upper Extremity Assessment Upper Extremity Assessment: Generalized weakness    Lower Extremity Assessment Lower Extremity Assessment: Generalized weakness;LLE deficits/detail LLE Deficits / Details: min DF AROM 2* swelling/edema LLE: Unable to fully assess due to pain    Cervical / Trunk Assessment Cervical / Trunk Assessment: Normal  Communication   Communication: No difficulties  Cognition Arousal/Alertness: Awake/alert Behavior During Therapy: WFL for tasks assessed/performed Overall Cognitive Status: Within Functional Limits for tasks assessed                                        General Comments      Exercises      Assessment/Plan    PT Assessment Patient needs continued PT services  PT Problem List Decreased strength;Decreased range of motion;Decreased activity tolerance;Decreased balance;Decreased mobility;Decreased knowledge of use of DME;Pain       PT Treatment Interventions DME instruction;Gait training;Stair training;Functional mobility training;Therapeutic activities;Therapeutic exercise;Balance training;Patient/family education    PT Goals (Current goals can be found in the Care Plan section)  Acute Rehab PT Goals Patient Stated Goal: REgain IND and 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 PT Goal Formulation: With patient Time For Goal Achievement: 04/25/21 Potential to Achieve Goals: Good    Frequency Min 3X/week   Barriers to discharge Decreased caregiver support Home alone; has bf, has son who mows her yard    Co-evaluation               AM-PAC PT "6 Clicks" Mobility  Outcome Measure Help needed turning from your back to your side while in a flat bed without using bedrails?: None Help needed moving from lying on your back to sitting on the side of a flat bed without using bedrails?: None Help needed moving to and from a bed to a chair (including a wheelchair)?: A Little Help needed standing up from a chair using your arms (e.g., wheelchair or bedside chair)?: A Little Help needed to walk in hospital room?: A Little Help needed climbing 3-5 steps with a railing? : A Lot 6 Click Score: 19    End of Session Equipment Utilized During Treatment: Gait belt Activity Tolerance: Patient tolerated treatment well;Patient limited by fatigue;Patient limited by pain Patient left: in chair;with call bell/phone within reach;with chair alarm set Nurse Communication: Mobility status PT Visit Diagnosis: Difficulty in walking, not elsewhere classified (R26.2);Unsteadiness on feet (R26.81);Pain Pain - Right/Left: Left Pain - part of body: Ankle and joints of foot    Time: 8003-4917 PT Time  Calculation (min) (ACUTE ONLY): 30 min   Charges:   PT Evaluation $PT Eval Low Complexity: 1 Low PT Treatments $Gait Training: 8-22 mins        [place] Pager [phone] Office [phone]   Arinze Rivadeneira 04/11/2021, 4:16 PM

## 2021-04-11 NOTE — Progress Notes (Signed)
[facility] Gastroenterology Progress Note  [name] 50 y.o. Oct 10, 1970  CC: Abdominal pain   Subjective: Patient seen and examined at bedside.  Abdominal pain continues to improve.  Denies nausea and vomiting.  Had 1 episode of small bowel movement yesterday.  No bowel movement today.  Denies any blood in the stool or black stool.    ROS : Afebrile, complaining of left foot pain   Objective: Vital signs in last 24 hours: Vitals:   04/11/21 0504 04/11/21 0900  BP: (!) 147/102 (!) 152/100  Pulse: 89   Resp:    Temp: 98.6 F (37 C)   SpO2: 100%     Physical Exam:  General:  Alert, cooperative, no distress, appears stated age  Head:  Normocephalic, without obvious abnormality, atraumatic  Eyes:  , EOM's intact, negative for icterus  Lungs:   No visible respiratory distress  Heart:  Regular rate and rhythm, S1, S2 normal  Abdomen:   Minimally distended abdomen, abdomen is soft, bowel sounds present, nontender, no peritoneal sign  Neuro Alert and oriented x3  Psych Anxious    Lab Results: Recent Labs    04/10/21 0525 04/11/21 0448  NA 134* 134*  K 3.5 3.7  CL 100 105  CO2 25 23  GLUCOSE 124* 89  BUN 13 13  CREATININE 0.79 0.69  CALCIUM 8.0* 7.7*  MG 1.8 1.5*  PHOS 4.3  --    Recent Labs    04/10/21 0525 04/11/21 0448  AST 83* 55*  ALT 33 29  ALKPHOS 209* 176*  BILITOT 1.9* 1.1  PROT 5.5* 4.8*  ALBUMIN 2.4* 2.1*   Recent Labs    04/09/21 1109 04/10/21 0525  WBC 7.6 4.8  NEUTROABS 6.0  --   HGB 14.8 11.5*  HCT 41.7 33.6*  MCV 102.5* 103.7*  PLT 198 161   No results for input(s): LABPROT, INR in the last 72 hours.    Assessment/Plan: Epigastric abdominal pain radiating to back with mild elevated lipase.  Although CT scan negative for pancreatitis clinical presentation is concerning for mild pancreatitis.  -Dilated CBD. ?  Passed CBD stone.  MRI MRCP yesterday showed persistent biliary dilation without any clear cause.  Finding could be from  recently passed calculus, stricture or sphincter of Oddi dysfunction. -Variant pancreatic ductal anatomy with the drainage of pancreas through minor papilla -Mild chronic edema and thickening.  Nonspecific.  Patient's diarrhea has improved.  -Abdominal pain improving. -Nausea and vomiting.  Resolved  -Abnormal LFTs.  Could be from alcohol use.  Getting better   Recommendations ------------------------- -Continue supportive care for now -Advance diet to soft -May benefit from outpatient EUS to rule out distal biliary stricture -She may benefit from outpatient colonoscopy as well -GI will follow      Otis Brace MD, [place] 04/11/2021, 10:08 AM  Contact #  [phone]

## 2021-04-11 NOTE — Progress Notes (Signed)
PROGRESS NOTE    [name]  KGU:[id number] DOB: 08/24/1970 DOA: 04/09/2021 PCP: Kerin Perna, NP    Brief Narrative:  [name] is a 50 year old female with past medical history significant for essential hypertension, EtOH use disorder who presented to [place] ED on 8/19 with nausea/vomiting, abdominal pain, chills and poor oral intake.  Patient reports nausea and vomiting that has been intermittent over the last 3 weeks.  Patient reports that she drinks about half 1/5 liquor per day, last drink was morning of ED presentation.  Also endorses smokes about 1 pack/day.  Abdominal pain localized to the epigastric region.  In the ED, temperature 98.2 F, HR 96, RR 22, BP 159/120, SPO2 100% on room air.  Sodium 137, potassium 2.8, chloride 96, CO2 24, glucose 121, BUN 12, creatinine 0.92.  Anion gap 17, alkaline phosphatase 262, albumin 3.2, lipase 319, AST 99, ALT 41, total bilirubin 2.0.  High sensitive troponin 15.  WBC 7.6, hemoglobin 14.8, platelets 198.  COVID-19 PCR negative.  Influenza A/B PCR negative.  Urinalysis with moderate leukocytes, negative nitrite, rare bacteria, 0-5 WBCs.  Chest x-ray with no active cardiopulmonary disease process.  CT abdomen/pelvis with mild intra/extrahepatic ductal dilation with common bile duct measuring 1.2 cm; no other acute findings.  EDP consulted [facility] for further evaluation management of nausea/vomiting, abdominal pain secondary to pancreatitis.   Assessment & Plan:   Principal Problem:   Gallstone pancreatitis Active Problems:   Gout   Dilated cbd, acquired   Alcohol dependence with uncomplicated withdrawal (HCC)   Chronic pain syndrome   Benign essential HTN   Acute EtOH pancreatitis, mild Patient presenting to the ED with 3-week history of intermittent nausea/vomiting associated with epigastric pain.  Patient was noted to have elevated lipase of 319 with notable intra/extrahepatic ductal dilation with CBD dilation of 1.2 cm.   Etiology likely secondary to history of chronic active alcohol abuse.  MRCP 8/20 with no pancreatic edema, persistent dilation biliary tree without clear cause, no filling defect with question of stricture/recently passed calculus versus sphincter of Oddi dysfunction, variant pancreatic ductal anatomy with drainage of pancreas through minor papilla. --[facility] following; appreciate assistance --Diet advanced to soft today --Dilaudid 0.5-1 mg IV every 2 hours as needed moderate/severe pain --May benefit from outpatient EUS to rule out distal biliary stricture and outpatient colonoscopy --Supportive care, antiemetics  Transaminitis/hyperbilirubinemia likely 2/2 EtOH abuse CT abdomen/pelvis notable for intra/extrahepatic ductal dilation with common bile duct 1.2 cm, no gallstones appreciated within the gallbladder.  Elgie likely secondary to alcohol abuse. --AST 99>83>55 --ALT 41>33>29 --Tbili 2.0>1.9>1.1 --follow CMP daily  Left foot pain Patient complaining of left foot pain and associated swelling.  Difficulty ambulating.  Uric acid 5.1.  Foot x-ray with no acute displaced fracture, stable small erosions base first/second proximal phalanges nonspecific; inflammatory versus crystalline arthropathy could be considered, stable osteoarthritis with mild diffuse soft tissue swelling.  CRP elevated 7.8. --Prednisone 40 mg p.o. daily --Trend CRP --PT evaluation  Hypokalemia Potassium 2.8 on presentation.  Etiology likely from poor oral intake in the setting of nausea/vomiting.  Repleted. --Potassium 3.7 today.  Hypomagnesemia Magnesium 1.5, will replete. --Repeat electrolytes in a.m.  Essential hypertension BP 141/92 this morning. --Clonidine 0.2 transdermal weekly  Chronic pain syndrome --Continue Suboxone 4 mg p.o. daily --PT evaluation pending  Hx IVDU Patient reports previous history of heroin abuse.  States has been in remission for 15 years.  Denies any active illicit drug  use. --SW for assistance with  placement in outpatient substance abuse program  EtOH use disorder Patient reports significant alcohol use, has been trying to decrease amount/hard liquor she has been utilizing.  Patient does report tremors/shakes when no alcohol use.  Last alcohol use was a day of ED presentation on 8/19.  Denies any history of seizures.  Told on need for complete cessation. --CIWAA protocol with symptom triggered Ativan --Thiamine, multivitamin, folic acid --Monitor on telemetry  Tobacco use disorder: Counseled on need for complete cessation  Moderate protein calorie malnutrition Body mass index is 17.85 kg/m.  Patient very thin in appearance with muscle wasting/fat loss.  Likely secondary to EtOH dependence. --nutrition consult    DVT prophylaxis: SCDs Start: 04/09/21 2016   Code Status: Full Code Family Communication: No family present at bedside this morning; updated patient's son and daughter who is present at bedside extensively yesterday afternoon  Disposition Plan:  Level of care: Telemetry Status is: Inpatient  Remains inpatient appropriate because:Ongoing diagnostic testing needed not appropriate for outpatient work up, Unsafe d/c plan, IV treatments appropriate due to intensity of illness or inability to take PO, and Inpatient level of care appropriate due to severity of illness  Dispo: The patient is from: Home              Anticipated d/c is to: Home              Patient currently is not medically stable to d/c.   Difficult to place patient No   Consultants:  [facility]  Procedures:  MRCP  Antimicrobials:  None   Subjective: Patient seen examined at bedside, resting comfortably.  Abdominal pain much improved.  Continues with mild withdrawal symptoms receiving Ativan last night.  Wishes to advance diet this morning.  Seen by GI this morning with likely recommendations of outpatient EUS and colonoscopy.  Continues to complain of left  foot pain; starting on prednisone for elevated ESR/CRP.  No other questions or concerns at this time.  Denies headache, no chest pain, no shortness of breath, no abdominal pain, no fever/chills/night sweats, no nausea cefonicid/diarrhea.  No acute events overnight per nursing staff.  Objective: Vitals:   04/10/21 1205 04/10/21 1913 04/11/21 0504 04/11/21 0900  BP: (!) 145/100 122/84 (!) 147/102 (!) 152/100  Pulse: 68 88 89   Resp: 19 20    Temp: 100.1 F (37.8 C) 98.7 F (37.1 C) 98.6 F (37 C)   TempSrc:   Oral   SpO2: 100% 98% 100%   Weight:      Height:        Intake/Output Summary (Last 24 hours) at 04/11/2021 1105 Last data filed at 04/11/2021 0900 Gross per 24 hour  Intake 480 ml  Output 1 ml  Net 479 ml   Filed Weights   04/09/21 1044 04/09/21 1052  Weight: 51.7 kg 51.7 kg    Examination:  General exam: Appears anxious, no acute distress, thin in appearance, appears older than stated age Respiratory system: Clear to auscultation. Respiratory effort normal.  On room air Cardiovascular system: S1 & S2 heard, RRR. No JVD, murmurs, rubs, gallops or clicks. No pedal edema. Gastrointestinal system: Abdomen is slightly tender to epigastric region, nondistended, soft. No organomegaly or masses felt. Normal bowel sounds heard. Central nervous system: Alert and oriented. No focal neurological deficits. Extremities: Symmetric 5 x 5 power. Skin: No rashes, lesions or ulcers Psychiatry: Judgement and insight appear poor.  Anxious mood; affect appropriate.     Data Reviewed: I have personally reviewed following  labs and imaging studies  CBC: Recent Labs  Lab 04/09/21 1109 04/10/21 0525  WBC 7.6 4.8  NEUTROABS 6.0  --   HGB 14.8 11.5*  HCT 41.7 33.6*  MCV 102.5* 103.7*  PLT 198 707   Basic Metabolic Panel: Recent Labs  Lab 04/09/21 1109 04/10/21 0525 04/11/21 0448  NA 137 134* 134*  K 2.8* 3.5 3.7  CL 96* 100 105  CO2 _0 GLUCOSE 121* 124* 89  BUN _1 CREATININE 0.92 0.79 0.69  CALCIUM 9.4 8.0* 7.7*  MG  --  1.8 1.5*  PHOS  --  4.3  --    GFR: Estimated Creatinine Clearance: 67.9 mL/min (by C-G formula based on SCr of 0.69 mg/dL). Liver Function Tests: Recent Labs  Lab 04/09/21 1109 04/10/21 0525 04/11/21 0448  AST 99* 83* 55*  ALT 41 33 29  ALKPHOS 262* 209* 176*  BILITOT 2.0* 1.9* 1.1  PROT 7.4 5.5* 4.8*  ALBUMIN 3.2* 2.4* 2.1*   Recent Labs  Lab 04/09/21 1109 04/10/21 0525  LIPASE 319* 272*   No results for input(s): AMMONIA in the last 168 hours. Coagulation Profile: No results for input(s): INR, PROTIME in the last 168 hours. Cardiac Enzymes: No results for input(s): CKTOTAL, CKMB, CKMBINDEX, TROPONINI in the last 168 hours. BNP (last 3 results) No results for input(s): PROBNP in the last 8760 hours. HbA1C: No results for input(s): HGBA1C in the last 72 hours. CBG: No results for input(s): GLUCAP in the last 168 hours. Lipid Profile: Recent Labs    04/10/21 1335  CHOL 167  HDL 79  LDLCALC 79  TRIG 46  CHOLHDL 2.1   Thyroid Function Tests: Recent Labs    04/10/21 0525  TSH 0.242*   Anemia Panel: No results for input(s): VITAMINB12, FOLATE, FERRITIN, TIBC, IRON, RETICCTPCT in the last 72 hours. Sepsis Labs: No results for input(s): PROCALCITON, LATICACIDVEN in the last 168 hours.  Recent Results (from the past 240 hour(s))  Resp Panel by RT-PCR (Flu A&B, Covid) Nasopharyngeal Swab     Status: None   Collection Time: 04/09/21  5:32 PM   Specimen: Nasopharyngeal Swab; Nasopharyngeal(NP) swabs in vial transport medium  Result Value Ref Range Status   SARS Coronavirus 2 by RT PCR NEGATIVE NEGATIVE Final    Comment: (NOTE) SARS-CoV-2 target nucleic acids are NOT DETECTED.  The SARS-CoV-2 RNA is generally detectable in upper respiratory specimens during the acute phase of infection. The lowest concentration of SARS-CoV-2 viral copies this assay can detect is 138 copies/mL. A negative result  does not preclude SARS-Cov-2 infection and should not be used as the sole basis for treatment or other patient management decisions. A negative result may occur with  improper specimen collection/handling, submission of specimen other than nasopharyngeal swab, presence of viral mutation(s) within the areas targeted by this assay, and inadequate number of viral copies(<138 copies/mL). A negative result must be combined with clinical observations, patient history, and epidemiological information. The expected result is Negative.  Fact Sheet for Patients:  EntrepreneurPulse.com.au  Fact Sheet for Healthcare Providers:  IncredibleEmployment.be  This test is no t yet approved or cleared by the [place] FDA and  has been authorized for detection and/or diagnosis of SARS-CoV-2 by FDA under an Emergency Use Authorization (EUA). This EUA will remain  in effect (meaning this test can be used) for the duration of the COVID-19 declaration under Section 564(b)(1) of the Act, 21 U.S.C.section 360bbb-3(b)(1), unless the authorization is terminated  or  revoked sooner.       Influenza A by PCR NEGATIVE NEGATIVE Final   Influenza B by PCR NEGATIVE NEGATIVE Final    Comment: (NOTE) The Xpert Xpress SARS-CoV-2/FLU/RSV plus assay is intended as an aid in the diagnosis of influenza from Nasopharyngeal swab specimens and should not be used as a sole basis for treatment. Nasal washings and aspirates are unacceptable for Xpert Xpress SARS-CoV-2/FLU/RSV testing.  Fact Sheet for Patients: EntrepreneurPulse.com.au  Fact Sheet for Healthcare Providers: IncredibleEmployment.be  This test is not yet approved or cleared by the [place] FDA and has been authorized for detection and/or diagnosis of SARS-CoV-2 by FDA under an Emergency Use Authorization (EUA). This EUA will remain in effect (meaning this test can be used) for  the duration of the COVID-19 declaration under Section 564(b)(1) of the Act, 21 U.S.C. section 360bbb-3(b)(1), unless the authorization is terminated or revoked.  Performed at [facility], [place] [address], [place], [place] [address]          Radiology Studies: CT ABDOMEN PELVIS W CONTRAST  Result Date: 04/09/2021 CLINICAL DATA:  Nausea/vomiting Epigastric pain EXAM: CT ABDOMEN AND PELVIS WITH CONTRAST TECHNIQUE: Multidetector CT imaging of the abdomen and pelvis was performed using the standard protocol following bolus administration of intravenous contrast. CONTRAST:  77m OMNIPAQUE IOHEXOL 350 MG/ML SOLN COMPARISON:  Ultrasound 04/07/2021, CT abdomen pelvis 04/24/2009 FINDINGS: Lower chest: Left basilar linear atelectasis. Hepatobiliary: No focal liver abnormality. Likely perfusional anomaly in the quadrate. The gallbladder is unremarkable. There is mild intrahepatic and extrahepatic ductal dilation with common duct measuring up to 1.2 cm. Pancreas: Unremarkable. No pancreatic ductal dilatation or surrounding inflammatory changes. Spleen: Normal in size without focal abnormality. Adrenals/Urinary Tract: Adrenal glands are unremarkable. Multifocal right renal cortical scarring. No hydronephrosis. The left kidney is unremarkable with exception of multiple small subcentimeter renal cysts. No nephroureterolithiasis. The bladder is well distended and unremarkable. Stomach/Bowel: Stomach is within normal limits. There is no evidence of bowel obstruction. Prior appendectomy. Vascular/Lymphatic: Aortoiliac atherosclerotic calcifications. No AAA. No lymphadenopathy. Reproductive: Unremarkable. Other: No abdominal wall hernia or abnormality. No abdominopelvic ascites. Musculoskeletal: No acute or significant osseous findings. IMPRESSION: Mild intra and extrahepatic ductal dilation with common duct measuring up to 1.2 cm. Correlate with laboratory findings and consider MRCP if clinically  indicated. No other acute findings.  Multifocal right renal cortical scarring. Electronically Signed   By: JMaurine SimmeringM.D.   On: 04/09/2021 17:13   MR 3D Recon At Scanner  Result Date: 04/10/2021 CLINICAL DATA:  Suspected pancreatitis in a 50year old female. EXAM: MRI ABDOMEN WITHOUT AND WITH CONTRAST (INCLUDING MRCP) TECHNIQUE: Multiplanar multisequence MR imaging of the abdomen was performed both before and after the administration of intravenous contrast. Heavily T2-weighted images of the biliary and pancreatic ducts were obtained, and three-dimensional MRCP images were rendered by post processing. CONTRAST:  576mGADAVIST GADOBUTROL 1 MMOL/ML IV SOLN COMPARISON:  CT of the abdomen and pelvis from April 09, 2021. FINDINGS: Lower chest: Incidental imaging of the lung bases is unremarkable on MRI. Hepatobiliary: Mild hepatic steatosis. No focal hepatic lesion. Intra and extrahepatic biliary duct distension is similar to the recent CT with moderate dilation, no filling defect or visible obstructing lesion. No pericholecystic stranding. Tapered appearance of distal common bile duct. No focal, suspicious hepatic lesion. Portal vein is patent. Hepatic veins are patent. Pancreas: Normal intrinsic T1 signal of the pancreas. Pancreatic drainage via minor papilla. No signs of peripancreatic edema. Normal pancreatic enhancement without focal pancreatic lesion. Spleen:  Normal spleen. Adrenals/Urinary Tract:  Adrenal glands are normal. Symmetric renal enhancement though with scarring of the RIGHT renal parenchyma as on the previous CT. Signs of bilateral renal cysts, some with hemorrhage less than a cm size and greatest on the LEFT. No perinephric stranding or hydronephrosis. Stomach/Bowel: Query mild colonic edema and thickening. Some submucosal fat deposition along the colon. Vascular/Lymphatic: Patent abdominal vasculature. There is no gastrohepatic or hepatoduodenal ligament lymphadenopathy. No retroperitoneal or  mesenteric lymphadenopathy. Other:  No ascites Musculoskeletal: No suspicious bone lesions identified. IMPRESSION: 1. Normal intrinsic T1 signal in the pancreas without pancreatic edema by MRI. 2. Persistent dilation of the biliary tree without clear cause or filling defect with tapered appearance. Could be seen in the setting of stricture, recently passed calculus or sphincter of Oddi dysfunction. 3. Variant pancreatic ductal anatomy with drainage of pancreas via minor papilla but without ductal dilation. 4. Mild hepatic steatosis. 5. Query mild colonic edema and thickening. Correlate with any signs of colitis. 6. Renal cortical scarring RIGHT greater than LEFT. Electronically Signed   By: Zetta Bills M.D.   On: 04/10/2021 13:53   DG Chest Portable 1 View  Result Date: 04/09/2021 CLINICAL DATA:  chest heaviness EXAM: PORTABLE CHEST 1 VIEW COMPARISON:  10/06/2009, 02/08/2009 FINDINGS: The heart size and mediastinal contours are within normal limits. Bilateral nipple shadows, as seen on prior studies. Both lungs are clear. The visualized skeletal structures are unremarkable. IMPRESSION: No active disease. Electronically Signed   By: Davina Poke D.O.   On: 04/09/2021 12:20   DG Foot Complete Left  Result Date: 04/10/2021 CLINICAL DATA:  Chronic left foot pain, difficulty ambulating EXAM: LEFT FOOT - COMPLETE 3+ VIEW COMPARISON:  01/08/2021 FINDINGS: Frontal, oblique, and lateral views of the left foot are obtained. No acute displaced fracture. Mild osteoarthritis of the first metatarsophalangeal joint is again noted. Small erosive changes are seen along the lateral aspect of the base of the first and second proximal phalanges, nonspecific. No other erosive changes. Intraosseous lipoma versus bone cyst within the calcaneus unchanged. Stable osteoarthritis within the midfoot and hindfoot. Mild diffuse soft tissue swelling has developed in the interim. IMPRESSION: 1. No acute displaced fracture. 2. Subtle  small erosions at the base of the first and second proximal phalanges as above, nonspecific. Inflammatory or crystalline arthropathy could be considered in the appropriate setting. 3. Stable osteoarthritis. 4. Mild diffuse soft tissue swelling. Electronically Signed   By: Randa Ngo M.D.   On: 04/10/2021 15:32   MR ABDOMEN MRCP W WO CONTAST  Result Date: 04/10/2021 CLINICAL DATA:  Suspected pancreatitis in a 50 year old female. EXAM: MRI ABDOMEN WITHOUT AND WITH CONTRAST (INCLUDING MRCP) TECHNIQUE: Multiplanar multisequence MR imaging of the abdomen was performed both before and after the administration of intravenous contrast. Heavily T2-weighted images of the biliary and pancreatic ducts were obtained, and three-dimensional MRCP images were rendered by post processing. CONTRAST:  78m GADAVIST GADOBUTROL 1 MMOL/ML IV SOLN COMPARISON:  CT of the abdomen and pelvis from April 09, 2021. FINDINGS: Lower chest: Incidental imaging of the lung bases is unremarkable on MRI. Hepatobiliary: Mild hepatic steatosis. No focal hepatic lesion. Intra and extrahepatic biliary duct distension is similar to the recent CT with moderate dilation, no filling defect or visible obstructing lesion. No pericholecystic stranding. Tapered appearance of distal common bile duct. No focal, suspicious hepatic lesion. Portal vein is patent. Hepatic veins are patent. Pancreas: Normal intrinsic T1 signal of the pancreas. Pancreatic drainage via minor papilla. No signs of peripancreatic edema.  Normal pancreatic enhancement without focal pancreatic lesion. Spleen:  Normal spleen. Adrenals/Urinary Tract:  Adrenal glands are normal. Symmetric renal enhancement though with scarring of the RIGHT renal parenchyma as on the previous CT. Signs of bilateral renal cysts, some with hemorrhage less than a cm size and greatest on the LEFT. No perinephric stranding or hydronephrosis. Stomach/Bowel: Query mild colonic edema and thickening. Some submucosal  fat deposition along the colon. Vascular/Lymphatic: Patent abdominal vasculature. There is no gastrohepatic or hepatoduodenal ligament lymphadenopathy. No retroperitoneal or mesenteric lymphadenopathy. Other:  No ascites Musculoskeletal: No suspicious bone lesions identified. IMPRESSION: 1. Normal intrinsic T1 signal in the pancreas without pancreatic edema by MRI. 2. Persistent dilation of the biliary tree without clear cause or filling defect with tapered appearance. Could be seen in the setting of stricture, recently passed calculus or sphincter of Oddi dysfunction. 3. Variant pancreatic ductal anatomy with drainage of pancreas via minor papilla but without ductal dilation. 4. Mild hepatic steatosis. 5. Query mild colonic edema and thickening. Correlate with any signs of colitis. 6. Renal cortical scarring RIGHT greater than LEFT. Electronically Signed   By: Zetta Bills M.D.   On: 04/10/2021 13:53        Scheduled Meds:  buprenorphine  4 mg Sublingual Daily   cloNIDine  0.2 mg Transdermal Weekly   folic acid  1 mg Oral Daily   gabapentin  300 mg Oral TID   multivitamin with minerals  1 tablet Oral Daily   nicotine  21 mg Transdermal Daily   predniSONE  40 mg Oral Q breakfast   sodium chloride flush  3 mL Intravenous Q12H   thiamine  100 mg Oral Daily   Or   thiamine  100 mg Intravenous Daily   Continuous Infusions:  magnesium sulfate bolus IVPB 4 g (04/11/21 0921)     LOS: 2 days    Time spent: 39 minutes spent on chart review, discussion with nursing staff, consultants, updating family and interview/physical exam; more than 50% of that time was spent in counseling and/or coordination of care.    Brooks Kinnan J [place] DO [facility] Available via Epic secure chat 7am-7pm After these hours, please refer to coverage provider listed on [email or web address] 04/11/2021, 11:05 AM

## 2021-04-11 NOTE — Progress Notes (Addendum)
Due to patient lethargy. MD notified of increased lethargy.

## 2021-04-11 NOTE — Progress Notes (Signed)
Patient noted to have home medications at bedside when walking into room. Patient also noted to be lethargic. Home medications confiscated and taken down to pharmacy. Home medications included clonidine tablets, clonidine patches, suboxone, and cephalexin.

## 2021-04-11 NOTE — Progress Notes (Signed)
Initial Nutrition Assessment  DOCUMENTATION CODES:   Underweight  INTERVENTION:   -Ensure Enlive po TID, each supplement provides 350 kcal and 20 grams of protein  -MVI with minerals daily  NUTRITION DIAGNOSIS:   Inadequate oral intake related to decreased appetite as evidenced by per patient/family report.  GOAL:   Patient will meet greater than or equal to 90% of their needs  MONITOR:   PO intake, Supplement acceptance, Labs, Weight trends, Skin, I & O's  REASON FOR ASSESSMENT:   Consult Assessment of nutrition requirement/status  ASSESSMENT:   50 year old woman PMH hypertension, alcoholism, presented to the emergency department with vomiting, abdominal pain, chills, poor oral intake.  Admitted for intractable nausea, vomiting, pancreatitis, possible gallstone induced, suspected gout.  Pt admitted with intractable nausea, vomiting, and abdominal pain with mildly elevated lipase, elevated LFTs, suspicious for acute/ chronic pancreatitis complicated by alcohol use.   Reviewed I/O's: +359 ml x 24 hours and +1.3 L since admission  Pt unavailable at time of visit. Attempted to speak with pt via call to hospital room phone, however, unable to reach. RD unable to obtain further nutrition-related history or complete nutrition-focused physical exam at this time.    Per GI notes, plan for outpatient EUS to rule out biliary structure as well an outpatient colonoscopy.   Per H&P, pt with poor oral intake for 3 weeks PTA, consuming mainly fluids. Pt also consumes half of a fifth of liquor daily.   Pt's intake has improved since hospitalization. Noted meal completion 50-100%.   Reviewed wt hx; pt has experienced a 5% wt loss over the past 3 months, which is not significant for time frame.  Given underweight status and ETOH abuse, suspect pt with malnutrition, however, unable to identify at this time. Pt would greatly benefit from addition of oral nutrition supplements.    Medications reviewed and include folic acid, prednisone, and thiamine.  Labs reviewed: Na: 134.   Diet Order:   Diet Order             DIET SOFT Room service appropriate? Yes; Fluid consistency: Thin  Diet effective now                   EDUCATION NEEDS:   No education needs have been identified at this time  Skin:  Skin Assessment: Reviewed RN Assessment  Last BM:  04/10/21  Height:   Ht Readings from Last 1 Encounters:  04/09/21 5' 7"  (1.702 m)    Weight:   Wt Readings from Last 1 Encounters:  04/09/21 51.7 kg    Ideal Body Weight:  61.4 kg  BMI:  Body mass index is 17.85 kg/m.  Estimated Nutritional Needs:   Kcal:  1850-2050  Protein:  100-115 grams  Fluid:  > 1.8 L    Loistine Chance, RD, LDN, [place] Registered Dietitian II Certified Diabetes Care and Education Specialist Please refer to [facility] for RD and/or RD on-call/weekend/after hours pager

## 2021-04-12 ENCOUNTER — Other Ambulatory Visit (HOSPITAL_BASED_OUTPATIENT_CLINIC_OR_DEPARTMENT_OTHER): Payer: Self-pay

## 2021-04-12 LAB — COMPREHENSIVE METABOLIC PANEL
ALT: 41 U/L (ref 0–44)
AST: 76 U/L — ABNORMAL HIGH (ref 15–41)
Albumin: 2.4 g/dL — ABNORMAL LOW (ref 3.5–5.0)
Alkaline Phosphatase: 202 U/L — ABNORMAL HIGH (ref 38–126)
Anion gap: 5 (ref 5–15)
BUN: 14 mg/dL (ref 6–20)
CO2: 26 mmol/L (ref 22–32)
Calcium: 8.5 mg/dL — ABNORMAL LOW (ref 8.9–10.3)
Chloride: 109 mmol/L (ref 98–111)
Creatinine, Ser: 0.86 mg/dL (ref 0.44–1.00)
GFR, Estimated: >60 mL/min (ref >60)
Glucose, Bld: 150 mg/dL — ABNORMAL HIGH (ref 70–99)
Potassium: 4.5 mmol/L (ref 3.5–5.1)
Sodium: 140 mmol/L (ref 135–145)
Total Bilirubin: 0.7 mg/dL (ref 0.3–1.2)
Total Protein: 5.5 g/dL — ABNORMAL LOW (ref 6.5–8.1)

## 2021-04-12 LAB — MAGNESIUM: Magnesium: 1.9 mg/dL (ref 1.7–2.4)

## 2021-04-12 LAB — C-REACTIVE PROTEIN: CRP: 4 mg/dL — ABNORMAL HIGH (ref <1.0)

## 2021-04-12 MED ORDER — HYDRALAZINE HCL 20 MG/ML IJ SOLN
5.0000 mg | Freq: Three times a day (TID) | INTRAMUSCULAR | Status: DC | PRN
  Administered 2021-04-12: 5 mg via INTRAVENOUS
  Filled 2021-04-12: qty 1

## 2021-04-12 MED ORDER — HYDRALAZINE HCL 25 MG PO TABS
25.0000 mg | ORAL_TABLET | Freq: Four times a day (QID) | ORAL | Status: DC | PRN
  Administered 2021-04-12: 25 mg via ORAL
  Filled 2021-04-12 (×2): qty 1

## 2021-04-12 MED ORDER — HYDRALAZINE HCL 20 MG/ML IJ SOLN: 5.0000 mg | Freq: Three times a day (TID) | INTRAMUSCULAR | Status: DC | PRN

## 2021-04-12 MED ORDER — NICOTINE POLACRILEX 2 MG MT GUM
2.0000 mg | CHEWING_GUM | OROMUCOSAL | Status: DC | PRN
  Administered 2021-04-12: 2 mg via ORAL
  Filled 2021-04-12 (×3): qty 1

## 2021-04-12 NOTE — Progress Notes (Signed)
UNASSIGNED PATIENT Subjective: Patient is a 50 year old white female with a history of alcohol abuse who presented to the ED with abdominal pain and is found to have mild intrahepatic and extrahepatic ductal dilatation with CBD measuring 1.2 cm; the pancreas gallbladder and spleen were unremarkable.  An MRI done on 04/10/2021 revealed mild hepatic steatosis with no focal hepatic lesion again intra and extrahepatic bili ductal dilatation was was noted with no filling defects or visibly obstructing lesions that CBD was tapered distally portal vein was patent hepatic veins are patent as well pancreatic drainage was via the via the minor papilla with no signs of peripancreatic inflammation normal pancreatic enhancement without focal pancreatic lesions was noted. Patient claims that she is feeling very well she is at her low-fat meal today and is tolerated her diet well.  She denies having nausea vomiting. She has not had a BM today.  Objective: Vital signs in last 24 hours: Temp:  [98 F (36.7 C)-100 F (37.8 C)] 98 F (36.7 C) (08/22 0558) Pulse Rate:  [65-94] 65 (08/22 0558) Resp:  [18-20] 20 (08/22 0558) BP: (153-188)/(87-114) 153/87 (08/22 0656) SpO2:  [98 %-99 %] 99 % (08/22 0558) Last BM Date: 04/10/21  Intake/Output from previous day: 08/21 0701 - 08/22 0700 In: 4202 [P.O.:727; I.V.:3375; IV Piggyback:100] Out: 2 [Urine:2] Intake/Output this shift: Total I/O In: 680 [P.O.:680] Out: -   General appearance: alert, cooperative, appears older than stated age, cachectic, no distress, and pale Resp: clear to auscultation bilaterally Cardio: regular rate and rhythm, S1, S2 normal, no murmur, click, rub or gallop GI: soft, non-tender; bowel sounds normal; no masses,  no organomegaly Extremities: extremities normal, atraumatic, no cyanosis or edema  Lab Results: Recent Labs    04/10/21 0525  WBC 4.8  HGB 11.5*  HCT 33.6*  PLT 161   BMET Recent Labs    04/10/21 0525 04/11/21 0448  04/12/21 0437  NA 134* 134* 140  K 3.5 3.7 4.5  CL 100 105 109  CO2 25 23 26   GLUCOSE 124* 89 150*  BUN 13 13 14   CREATININE 0.79 0.69 0.86  CALCIUM 8.0* 7.7* 8.5*   LFT Recent Labs    04/12/21 0437  PROT 5.5*  ALBUMIN 2.4*  AST 76*  ALT 41  ALKPHOS 202*  BILITOT 0.7   Studies/Results: No results found.  Medications: I have reviewed the patient's current medications. Prior to Admission:  Medications Prior to Admission  Medication Sig Dispense Refill Last Dose   Aspirin-Caffeine (BC FAST PAIN RELIEF) 845-65 MG PACK Take 1 Package by mouth daily as needed (pain).   Past Month   Buprenorphine HCl-Naloxone HCl (SUBOXONE SL) Place 4 mg under the tongue daily.   04/09/2021   ondansetron (ZOFRAN ODT) 4 MG disintegrating tablet Take 1 tablet (4 mg total) by mouth every 8 (eight) hours as needed for nausea or vomiting. 20 tablet 0 04/08/2021   cloNIDine (CATAPRES - DOSED IN MG/24 HR) 0.2 mg/24hr patch Place 1 patch (0.2 mg total) onto the skin once a week. 4 patch 12 04/08/2021   hydrALAZINE (APRESOLINE) 50 MG tablet Take 1/2 tablet (25 mg total) by mouth once a day as needed. 30 tablet 2    potassium chloride SA (KLOR-CON) 20 MEQ tablet Take 1 tablet (20 mEq total) by mouth daily. 30 tablet 1    Scheduled:  buprenorphine  4 mg Sublingual Daily   cloNIDine  0.2 mg Transdermal Weekly   feeding supplement  237 mL Oral TID BM   folic  acid  1 mg Oral Daily   gabapentin  300 mg Oral TID   multivitamin with minerals  1 tablet Oral Daily   nicotine  21 mg Transdermal Daily   predniSONE  40 mg Oral Q breakfast   sodium chloride flush  3 mL Intravenous Q12H   thiamine  100 mg Oral Daily   Or   thiamine  100 mg Intravenous Daily   Assessment/Plan: 1) Acute alcoholic pancreatitis this seems to have resolved at the present time I suspect her ductal dilatation is from chronic alcohol use. Will sign off now. Please call as needed. 2) Elevated liver enzymes secondary to alcohol use. 3)  HTN. 4) Chronic pain syndrome-history of IVDU.   LOS: 3 days   Juanita Craver 04/12/2021, 3:46 PM

## 2021-04-12 NOTE — Progress Notes (Signed)
PROGRESS NOTE    [name]  GGY:[id number] DOB: [birth year] DOA: 04/09/2021 PCP: Kerin Perna, NP    Brief Narrative:  [name] is a 50 year old female with past medical history significant for essential hypertension, EtOH use disorder who presented to [place] ED on 8/19 with nausea/vomiting, abdominal pain, chills and poor oral intake.  Patient reports nausea and vomiting that has been intermittent over the last 3 weeks.  Patient reports that she drinks about half 1/5 liquor per day, last drink was morning of ED presentation.  Also endorses smokes about 1 pack/day.  Abdominal pain localized to the epigastric region.  In the ED, temperature 98.2 F, HR 96, RR 22, BP 159/120, SPO2 100% on room air.  Sodium 137, potassium 2.8, chloride 96, CO2 24, glucose 121, BUN 12, creatinine 0.92.  Anion gap 17, alkaline phosphatase 262, albumin 3.2, lipase 319, AST 99, ALT 41, total bilirubin 2.0.  High sensitive troponin 15.  WBC 7.6, hemoglobin 14.8, platelets 198.  COVID-19 PCR negative.  Influenza A/B PCR negative.  Urinalysis with moderate leukocytes, negative nitrite, rare bacteria, 0-5 WBCs.  Chest x-ray with no active cardiopulmonary disease process.  CT abdomen/pelvis with mild intra/extrahepatic ductal dilation with common bile duct measuring 1.2 cm; no other acute findings.  EDP consulted [facility] for further evaluation management of nausea/vomiting, abdominal pain secondary to pancreatitis.   Assessment & Plan:   Principal Problem:   Gallstone pancreatitis Active Problems:   Gout   Dilated cbd, acquired   Alcohol dependence with uncomplicated withdrawal (HCC)   Chronic pain syndrome   Benign essential HTN   Acute EtOH pancreatitis, mild Patient presenting to the ED with 3-week history of intermittent nausea/vomiting associated with epigastric pain.  Patient was noted to have elevated lipase of 319 with notable intra/extrahepatic ductal dilation with CBD dilation of 1.2 cm.   Etiology likely secondary to history of chronic active alcohol abuse.  MRCP 8/20 with no pancreatic edema, persistent dilation biliary tree without clear cause, no filling defect with question of stricture/recently passed calculus versus sphincter of Oddi dysfunction, variant pancreatic ductal anatomy with drainage of pancreas through minor papilla. --[facility] following; appreciate assistance --Diet advanced to soft today --Dilaudid 0.5-1 mg IV every 2 hours as needed moderate/severe pain --May benefit from outpatient EUS to rule out distal biliary stricture and outpatient colonoscopy --Supportive care, antiemetics  Transaminitis/hyperbilirubinemia likely 2/2 EtOH abuse CT abdomen/pelvis notable for intra/extrahepatic ductal dilation with common bile duct 1.2 cm, no gallstones appreciated within the gallbladder.  Elgie likely secondary to alcohol abuse. --AST 99>83>55>76 --ALT 41>33>29>41 --Tbili 2.0>1.9>1.1>0.7 --follow CMP daily  Left foot pain Patient complaining of left foot pain and associated swelling.  Difficulty ambulating.  Uric acid 5.1.  Foot x-ray with no acute displaced fracture, stable small erosions base first/second proximal phalanges nonspecific; inflammatory versus crystalline arthropathy could be considered, stable osteoarthritis with mild diffuse soft tissue swelling.  CRP elevated 7.8. --CRP 7.8>4.0 --Prednisone 40 mg p.o. daily --Trend CRP --PT following, currently recommend home health PT  Hypokalemia: Resolved Potassium 2.8 on presentation.  Etiology likely from poor oral intake in the setting of nausea/vomiting.  Repleted. --Potassium 4.5 today.  Hypomagnesemia Magnesium 1.9, this morning --Repeat electrolytes in a.m.  Essential hypertension BP 153/87 this morning. --Clonidine 0.2 transdermal weekly  Chronic pain syndrome --Continue Suboxone 4 mg p.o. daily --PT following with recommendations of home health PT on discharge  Hx IVDU Patient reports  previous history of heroin abuse.  States has been in  remission for 15 years.  Denies any active illicit drug use. --SW for assistance with placement in outpatient substance abuse program  EtOH use disorder Patient reports significant alcohol use, has been trying to decrease amount/hard liquor she has been utilizing.  Patient does report tremors/shakes when no alcohol use.  Last alcohol use was a day of ED presentation on 8/19.  Denies any history of seizures.  Told on need for complete cessation. --CIWAA protocol with symptom triggered Ativan --Thiamine, multivitamin, folic acid --Monitor on telemetry  Tobacco use disorder: Counseled on need for complete cessation  Moderate protein calorie malnutrition Body mass index is 17.85 kg/m.  Patient very thin in appearance with muscle wasting/fat loss.  Likely secondary to EtOH dependence. Nutrition Status: Nutrition Problem: Inadequate oral intake Etiology: decreased appetite Signs/Symptoms: per patient/family report Interventions: Ensure Enlive (each supplement provides 350kcal and 20 grams of protein), MVI -- Dietitian following, appreciate assistance; continue to encourage increased oral intake, supplement use.    DVT prophylaxis: SCDs Start: 04/09/21 2016   Code Status: Full Code Family Communication: No family present at bedside this morning; updated patient's son and daughter who is present at bedside extensively yesterday afternoon  Disposition Plan:  Level of care: Telemetry Status is: Inpatient  Remains inpatient appropriate because:Ongoing diagnostic testing needed not appropriate for outpatient work up, Unsafe d/c plan, IV treatments appropriate due to intensity of illness or inability to take PO, and Inpatient level of care appropriate due to severity of illness  Dispo: The patient is from: Home              Anticipated d/c is to: Home              Patient currently is not medically stable to d/c.   Difficult to place  patient No   Consultants:  [facility]  Procedures:  MRCP  Antimicrobials:  None   Subjective: Patient seen examined at bedside, resting comfortably.  Continues with mild tremors.  Tolerating advance diet.  LFTs slightly increased this morning, unclear reason.  Patient was noted to have multiple medications in her purse yesterday, removed by nursing staff and secured in pharmacy.  Question if she was taking some of these medications as well.  Asking myself and multiple individuals about going outside and smoking.  Discussed with her that she needs to completely cessation from tobacco use as this is a contributing factor to her overall poor health.  No other questions or concerns at this time.  Denies headache, no chest pain, no shortness of breath, no abdominal pain, no fever/chills/night sweats, no nausea/vomiting/diarrhea.  No acute events overnight per nursing staff.  Objective: Vitals:   04/11/21 1959 04/11/21 2100 04/12/21 0558 04/12/21 0656  BP: (!) 172/111 (!) 164/98 (!) 188/98 (!) 153/87  Pulse: 82 88 65   Resp: 18  20   Temp: 100 F (37.8 C)  98 F (36.7 C)   TempSrc: Oral  Oral   SpO2: 98%  99%   Weight:      Height:        Intake/Output Summary (Last 24 hours) at 04/12/2021 1255 Last data filed at 04/12/2021 0900 Gross per 24 hour  Intake 4302.03 ml  Output 2 ml  Net 4300.03 ml   Filed Weights   04/09/21 1044 04/09/21 1052  Weight: 51.7 kg 51.7 kg    Examination:  General exam: Appears anxious, no acute distress, thin in appearance, appears older than stated age Respiratory system: Clear to auscultation. Respiratory effort normal.  On room air Cardiovascular system: S1 & S2 heard, RRR. No JVD, murmurs, rubs, gallops or clicks.  Mild left foot edema Gastrointestinal system: Abdomen is slightly tender to epigastric region, nondistended, soft. No organomegaly or masses felt. Normal bowel sounds heard. Central nervous system: Alert and oriented. No  focal neurological deficits. Extremities: Symmetric 5 x 5 power. Skin: No rashes, lesions or ulcers Psychiatry: Judgement and insight appear poor.  Anxious mood; affect appropriate.     Data Reviewed: I have personally reviewed following labs and imaging studies  CBC: Recent Labs  Lab 04/09/21 1109 04/10/21 0525  WBC 7.6 4.8  NEUTROABS 6.0  --   HGB 14.8 11.5*  HCT 41.7 33.6*  MCV 102.5* 103.7*  PLT 198 408   Basic Metabolic Panel: Recent Labs  Lab 04/09/21 1109 04/10/21 0525 04/11/21 0448 04/12/21 0437  NA 137 134* 134* 140  K 2.8* 3.5 3.7 4.5  CL 96* 100 105 109  CO2 24 25 23 26   GLUCOSE 121* 124* 89 150*  BUN 12 13 13 14   CREATININE 0.92 0.79 0.69 0.86  CALCIUM 9.4 8.0* 7.7* 8.5*  MG  --  1.8 1.5* 1.9  PHOS  --  4.3  --   --    GFR: Estimated Creatinine Clearance: 63.2 mL/min (by C-G formula based on SCr of 0.86 mg/dL). Liver Function Tests: Recent Labs  Lab 04/09/21 1109 04/10/21 0525 04/11/21 0448 04/12/21 0437  AST 99* 83* 55* 76*  ALT 41 33 29 41  ALKPHOS 262* 209* 176* 202*  BILITOT 2.0* 1.9* 1.1 0.7  PROT 7.4 5.5* 4.8* 5.5*  ALBUMIN 3.2* 2.4* 2.1* 2.4*   Recent Labs  Lab 04/09/21 1109 04/10/21 0525  LIPASE 319* 272*   No results for input(s): AMMONIA in the last 168 hours. Coagulation Profile: No results for input(s): INR, PROTIME in the last 168 hours. Cardiac Enzymes: No results for input(s): CKTOTAL, CKMB, CKMBINDEX, TROPONINI in the last 168 hours. BNP (last 3 results) No results for input(s): PROBNP in the last 8760 hours. HbA1C: No results for input(s): HGBA1C in the last 72 hours. CBG: No results for input(s): GLUCAP in the last 168 hours. Lipid Profile: Recent Labs    04/10/21 1335  CHOL 167  HDL 79  LDLCALC 79  TRIG 46  CHOLHDL 2.1   Thyroid Function Tests: Recent Labs    04/10/21 0525  TSH 0.242*   Anemia Panel: No results for input(s): VITAMINB12, FOLATE, FERRITIN, TIBC, IRON, RETICCTPCT in the last 72  hours. Sepsis Labs: No results for input(s): PROCALCITON, LATICACIDVEN in the last 168 hours.  Recent Results (from the past 240 hour(s))  Resp Panel by RT-PCR (Flu A&B, Covid) Nasopharyngeal Swab     Status: None   Collection Time: 04/09/21  5:32 PM   Specimen: Nasopharyngeal Swab; Nasopharyngeal(NP) swabs in vial transport medium  Result Value Ref Range Status   SARS Coronavirus 2 by RT PCR NEGATIVE NEGATIVE Final    Comment: (NOTE) SARS-CoV-2 target nucleic acids are NOT DETECTED.  The SARS-CoV-2 RNA is generally detectable in upper respiratory specimens during the acute phase of infection. The lowest concentration of SARS-CoV-2 viral copies this assay can detect is 138 copies/mL. A negative result does not preclude SARS-Cov-2 infection and should not be used as the sole basis for treatment or other patient management decisions. A negative result may occur with  improper specimen collection/handling, submission of specimen other than nasopharyngeal swab, presence of viral mutation(s) within the areas targeted by this assay, and inadequate number  of viral copies(<138 copies/mL). A negative result must be combined with clinical observations, patient history, and epidemiological information. The expected result is Negative.  Fact Sheet for Patients:  EntrepreneurPulse.com.au  Fact Sheet for Healthcare Providers:  IncredibleEmployment.be  This test is no t yet approved or cleared by the [place] FDA and  has been authorized for detection and/or diagnosis of SARS-CoV-2 by FDA under an Emergency Use Authorization (EUA). This EUA will remain  in effect (meaning this test can be used) for the duration of the COVID-19 declaration under Section 564(b)(1) of the Act, 21 U.S.C.section 360bbb-3(b)(1), unless the authorization is terminated  or revoked sooner.       Influenza A by PCR NEGATIVE NEGATIVE Final   Influenza B by PCR NEGATIVE  NEGATIVE Final    Comment: (NOTE) The Xpert Xpress SARS-CoV-2/FLU/RSV plus assay is intended as an aid in the diagnosis of influenza from Nasopharyngeal swab specimens and should not be used as a sole basis for treatment. Nasal washings and aspirates are unacceptable for Xpert Xpress SARS-CoV-2/FLU/RSV testing.  Fact Sheet for Patients: EntrepreneurPulse.com.au  Fact Sheet for Healthcare Providers: IncredibleEmployment.be  This test is not yet approved or cleared by the [place] FDA and has been authorized for detection and/or diagnosis of SARS-CoV-2 by FDA under an Emergency Use Authorization (EUA). This EUA will remain in effect (meaning this test can be used) for the duration of the COVID-19 declaration under Section 564(b)(1) of the Act, 21 U.S.C. section 360bbb-3(b)(1), unless the authorization is terminated or revoked.  Performed at [facility], [place] [address], [place], [place] [address]          Radiology Studies: DG Foot Complete Left  Result Date: 04/10/2021 CLINICAL DATA:  Chronic left foot pain, difficulty ambulating EXAM: LEFT FOOT - COMPLETE 3+ VIEW COMPARISON:  01/08/2021 FINDINGS: Frontal, oblique, and lateral views of the left foot are obtained. No acute displaced fracture. Mild osteoarthritis of the first metatarsophalangeal joint is again noted. Small erosive changes are seen along the lateral aspect of the base of the first and second proximal phalanges, nonspecific. No other erosive changes. Intraosseous lipoma versus bone cyst within the calcaneus unchanged. Stable osteoarthritis within the midfoot and hindfoot. Mild diffuse soft tissue swelling has developed in the interim. IMPRESSION: 1. No acute displaced fracture. 2. Subtle small erosions at the base of the first and second proximal phalanges as above, nonspecific. Inflammatory or crystalline arthropathy could be considered in the appropriate  setting. 3. Stable osteoarthritis. 4. Mild diffuse soft tissue swelling. Electronically Signed   By: Randa Ngo M.D.   On: 04/10/2021 15:32        Scheduled Meds:  buprenorphine  4 mg Sublingual Daily   cloNIDine  0.2 mg Transdermal Weekly   feeding supplement  237 mL Oral TID BM   folic acid  1 mg Oral Daily   gabapentin  300 mg Oral TID   multivitamin with minerals  1 tablet Oral Daily   nicotine  21 mg Transdermal Daily   predniSONE  40 mg Oral Q breakfast   sodium chloride flush  3 mL Intravenous Q12H   thiamine  100 mg Oral Daily   Or   thiamine  100 mg Intravenous Daily   Continuous Infusions:     LOS: 3 days    Time spent: 39 minutes spent on chart review, discussion with nursing staff, consultants, updating family and interview/physical exam; more than 50% of that time was spent in counseling and/or coordination of care.  Coraleigh Sheeran J [place] DO [facility] Available via Epic secure chat 7am-7pm After these hours, please refer to coverage provider listed on [email or web address] 04/12/2021, 12:55 PM

## 2021-04-12 NOTE — Progress Notes (Signed)
Mobility Specialist - Progress Note    04/12/21 1134  Mobility  Activity Ambulated in hall  Level of Assistance Contact guard assist, steadying assist  Assistive Device Four wheel walker  Distance Ambulated (ft) 400 ft  Mobility Ambulated with assistance in hallway  Mobility Response Tolerated well  Mobility performed by Mobility specialist  $Mobility charge 1 Mobility   Upon entry pt was eager to ambulate and agreeable to use 4 wheel walker instead of RW. Pt ambulated ~400 ft in hallway and did not report any SOB, pain, or dizziness during session. 1 sitted rest break was required before starting return trip. Pt returned to bed after session and was left in room with call bell at side. Pt requested to ambulate outside. Will check back if possible.   [place] Specialist Acute Rehabilitation Services Phone: [phone] 04/12/21, 11:38 AM

## 2021-04-13 LAB — C-REACTIVE PROTEIN: CRP: 1.4 mg/dL — ABNORMAL HIGH (ref <1.0)

## 2021-04-13 LAB — COMPREHENSIVE METABOLIC PANEL
ALT: 33 U/L (ref 0–44)
AST: 36 U/L (ref 15–41)
Albumin: 2.4 g/dL — ABNORMAL LOW (ref 3.5–5.0)
Alkaline Phosphatase: 163 U/L — ABNORMAL HIGH (ref 38–126)
Anion gap: 6 (ref 5–15)
BUN: 20 mg/dL (ref 6–20)
CO2: 25 mmol/L (ref 22–32)
Calcium: 8.7 mg/dL — ABNORMAL LOW (ref 8.9–10.3)
Chloride: 104 mmol/L (ref 98–111)
Creatinine, Ser: 0.71 mg/dL (ref 0.44–1.00)
GFR, Estimated: >60 mL/min (ref >60)
Glucose, Bld: 119 mg/dL — ABNORMAL HIGH (ref 70–99)
Potassium: 4.4 mmol/L (ref 3.5–5.1)
Sodium: 135 mmol/L (ref 135–145)
Total Bilirubin: 0.4 mg/dL (ref 0.3–1.2)
Total Protein: 5.4 g/dL — ABNORMAL LOW (ref 6.5–8.1)

## 2021-04-13 LAB — MAGNESIUM: Magnesium: 1.9 mg/dL (ref 1.7–2.4)

## 2021-04-13 MED ORDER — MELATONIN 3 MG PO TABS
3.0000 mg | ORAL_TABLET | Freq: Every day | ORAL | Status: DC
  Administered 2021-04-13: 3 mg via ORAL
  Filled 2021-04-13: qty 1

## 2021-04-13 MED ORDER — POLYETHYLENE GLYCOL 3350 17 G PO PACK
17.0000 g | PACK | Freq: Every day | ORAL | Status: DC | PRN
  Administered 2021-04-14: 17 g via ORAL
  Filled 2021-04-13: qty 1

## 2021-04-13 MED ORDER — AMLODIPINE BESYLATE 5 MG PO TABS
5.0000 mg | ORAL_TABLET | Freq: Every day | ORAL | Status: DC
  Administered 2021-04-13: 5 mg via ORAL
  Filled 2021-04-13: qty 1

## 2021-04-13 MED ORDER — LOSARTAN POTASSIUM 50 MG PO TABS
100.0000 mg | ORAL_TABLET | Freq: Every day | ORAL | Status: DC
  Administered 2021-04-13 – 2021-04-14 (×2): 100 mg via ORAL
  Filled 2021-04-13 (×2): qty 2

## 2021-04-13 MED ORDER — SENNOSIDES-DOCUSATE SODIUM 8.6-50 MG PO TABS
1.0000 | ORAL_TABLET | Freq: Two times a day (BID) | ORAL | Status: DC
  Administered 2021-04-13 – 2021-04-14 (×2): 1 via ORAL
  Filled 2021-04-13 (×3): qty 1

## 2021-04-13 NOTE — TOC Initial Note (Addendum)
Transition of Care [facility]) - Initial/Assessment Note    Patient Details  Name: [name] MRN: [id number] Date of Birth: Jan 03, 1971  Transition of Care [facility]) CM/SW Contact:    Trish Mage, LCSW Phone Number: 04/13/2021, 10:38 AM  Clinical Narrative:   Patient seen in follow up to MD consult for Substance Abuse.  Ms Maeda readily admits to needing help with alcohol cessation.  She has been off of heroin for years with the help of suboxone, states she has been getting it from a friend.  Ms Camposano was also abstinent from alcohol for multiple years in the past because she was being monitored by a pain clinic, and she knew if she drank they would cut her off of the narcotics.  She agreed she responds well to external structure.  With this in mind, I suggested she work with [facility].  They work with patients who have no insurance to get them Suboxone, and will likely require that she do random UA's to make sure she is clean.  Information place in [facility], and she states she will follow up at d/c.  She also made usre I knew she is in need of a rollator as she has dogs at home, and crutches with dogs is unsafe as they have knocked the crutches out from under her.  Order seen and appreciated.  Merideth Abbey with [facility] for delivery.  Also spoke with Ramond Marrow with [facility] with referral for charity [facility].  She will let me know. TOC will continue to follow during the course of hospitalization.  Addendum: Responding to MD consult re: need for help with medications, got patient a MATCH voucher, allowing her to fill each medication for $3.00.                 Expected Discharge Plan: Home/Self Care Barriers to Discharge: No Barriers Identified   Patient Goals and CMS Choice        Expected Discharge Plan and Services Expected Discharge Plan: Home/Self Care In-house Referral: Clinical Social Work     Living arrangements for the past 2 months: Single Family Home                 DME Arranged:  Walker rolling with seat DME Agency: [employer] Date DME Agency Contacted: 04/13/21 Time DME Agency Contacted: [phone]              Prior Living Arrangements/Services Living arrangements for the past 2 months: Single Family Home Lives with:: Significant Other Patient language and need for interpreter reviewed:: Yes Do you feel safe going back to the place where you live?: Yes      Need for Family Participation in Patient Care: Yes (Comment) Care giver support system in place?: Yes (comment)   Criminal Activity/Legal Involvement Pertinent to Current Situation/Hospitalization: No - Comment as needed  Activities of Daily Living Home Assistive Devices/Equipment: Crutches, Other (Comment) (left ankle brace that comes up below knee) ADL Screening (condition at time of admission) Patient's cognitive ability adequate to safely complete daily activities?: Yes Is the patient deaf or have difficulty hearing?: No Does the patient have difficulty seeing, even when wearing glasses/contacts?: No Does the patient have difficulty concentrating, remembering, or making decisions?: Yes Patient able to express need for assistance with ADLs?: Yes Does the patient have difficulty dressing or bathing?: Yes Independently performs ADLs?: No Communication: Independent Dressing (OT): Needs assistance Is this a change from baseline?: Pre-admission baseline Grooming: Independent Feeding: Independent Bathing: Needs assistance Is this  a change from baseline?: Pre-admission baseline Toileting: Needs assistance Is this a change from baseline?: Pre-admission baseline In/Out Bed: Needs assistance Is this a change from baseline?: Pre-admission baseline Walks in Home: Needs assistance Is this a change from baseline?: Pre-admission baseline Does the patient have difficulty walking or climbing stairs?: Yes Weakness of Legs: Both (left is worse) Weakness of Arms/Hands: None (has knot on right forefnger that came  up about a month ago)  Permission Sought/Granted                  Emotional Assessment Appearance:: Appears stated age Attitude/Demeanor/Rapport: Engaged Affect (typically observed): Appropriate Orientation: : Oriented to Self, Oriented to Place, Oriented to  Time, Oriented to Situation Alcohol / Substance Use: Alcohol Use Psych Involvement: No (comment)  Admission diagnosis:  Nausea [R11.0] Epigastric pain [R10.13] Gallstone pancreatitis [K85.10] Elevated lipase [R74.8] Non-intractable vomiting with nausea, unspecified vomiting type [R11.2] Patient Active Problem List   Diagnosis Date Noted   Gallstone pancreatitis 04/09/2021   Gout 04/09/2021   Dilated cbd, acquired 04/09/2021   Alcohol dependence with uncomplicated withdrawal ([place]) 04/09/2021   Chronic pain syndrome 04/09/2021   Benign essential HTN 04/09/2021   PCP:  Kerin Perna, NP Pharmacy:   [facility] [address] [place] [address] Phone: [phone] Fax: [phone]     Social Determinants of Health (SDOH) Interventions    Readmission Risk Interventions No flowsheet data found.

## 2021-04-13 NOTE — Progress Notes (Signed)
Physical Therapy Treatment Patient Details Name: [name] MRN: [id number] DOB: August 29, 1970 Today's Date: 04/13/2021    History of Present Illness Pt admitted from home with N/V and abdominal pain and dx with Gallstone Pancreatitis, Transaminitis, and hypokalemia.  Pt with hx of htn, chronic pain syndrome, protein calorie malnutrition and ETOH abuse.  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    PT Comments    Pt is progressing well with mobility, she tolerated increased ambulation distance of 260' with rollator, no loss of balance. She has chronic pain in her L ankle which limits her ambulation distance. She reports this started 3 years ago. She is mobilizing safely and independently with the rollator. She has met PT goals. PT signing off. Pt will be followed by mobility specialist to facilitate ambulation during the rest of her hospitalization.     Follow Up Recommendations  No PT follow up     Equipment Recommendations  Other (comment) (rollator)    Recommendations for Other Services       Precautions / Restrictions Precautions Precautions: Fall Required Braces or Orthoses:  (uses brace on L ankle at home as needed but not in room) Restrictions Weight Bearing Restrictions: No    Mobility  Bed Mobility Overal bed mobility: Independent                  Transfers Overall transfer level: Modified independent Equipment used: 4-wheeled walker Transfers: Sit to/from Stand Sit to Stand: Modified independent (Device/Increase time)            Ambulation/Gait Ambulation/Gait assistance: Modified independent (Device/Increase time) Gait Distance (Feet): 260 Feet Assistive device: 4-wheeled walker Gait Pattern/deviations: Step-through pattern;Trunk flexed;Decreased stride length Gait velocity: WFL   General Gait Details: Cues for  posture and position from RW.  Distance ltd by fatigue and  L ankle pain   Stairs             Wheelchair Mobility    Modified Rankin (Stroke Patients Only)       Balance Overall balance assessment: Needs assistance Sitting-balance support: No upper extremity supported;Feet supported Sitting balance-Leahy Scale: Good     Standing balance support: No upper extremity supported Standing balance-Leahy Scale: Fair                              Cognition Arousal/Alertness: Awake/alert Behavior During Therapy: WFL for tasks assessed/performed Overall Cognitive Status: Within Functional Limits for tasks assessed                                        Exercises      General Comments        Pertinent Vitals/Pain Pain Score: 6  Pain Location: L ankle with walking Pain Descriptors / Indicators: Aching;Sore Pain Intervention(s): Limited activity within patient's tolerance;Monitored during session;Patient requesting pain meds-RN notified;Ice applied;Repositioned    Home Living                      Prior Function            PT Goals (current goals can now be found in the care plan section) Acute Rehab PT Goals Patient Stated  Goal: Regain IND and 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 PT Goal Formulation: All assessment and education complete, DC therapy Time For Goal Achievement: 04/25/21 Potential to Achieve Goals: Good Progress towards PT goals: Goals met/education completed, patient discharged from PT    Frequency    Min 3X/week      PT Plan Current plan remains appropriate    Co-evaluation              AM-PAC PT "6 Clicks" Mobility   Outcome Measure  Help needed turning from your back to your side while in a flat bed without using bedrails?: None Help needed moving from lying on your back to sitting on the side of a flat bed without using bedrails?: None Help needed moving to and from a bed to a chair (including a  wheelchair)?: None Help needed standing up from a chair using your arms (e.g., wheelchair or bedside chair)?: None Help needed to walk in hospital room?: None Help needed climbing 3-5 steps with a railing? : None 6 Click Score: 24    End of Session Equipment Utilized During Treatment: Gait belt Activity Tolerance: Patient tolerated treatment well;Patient limited by pain Patient left: in bed;with call bell/phone within reach Nurse Communication: Mobility status PT Visit Diagnosis: Difficulty in walking, not elsewhere classified (R26.2);Unsteadiness on feet (R26.81);Pain Pain - Right/Left: Left Pain - part of body: Ankle and joints of foot     Time: 2820-8138 PT Time Calculation (min) (ACUTE ONLY): 13 min  Charges:  $Gait Training: 8-22 mins                    Blondell Reveal Kistler PT 04/13/2021  Acute Rehabilitation Services Pager [phone] Office [phone]

## 2021-04-13 NOTE — Progress Notes (Signed)
PROGRESS NOTE    KATIA HANNEN  BLT:[id number] DOB: 1970-11-18 DOA: 04/09/2021 PCP: Kerin Perna, NP    Brief Narrative:  [name] is a 50 year old female with past medical history significant for essential hypertension, EtOH use disorder who presented to [facility] ED on 8/19 with nausea/vomiting, abdominal pain, chills and poor oral intake.  Patient reports nausea and vomiting that has been intermittent over the last 3 weeks.  Patient reports that she drinks about half 1/5 liquor per day, last drink was morning of ED presentation.  Also endorses smokes about 1 pack/day.  Abdominal pain localized to the epigastric region.  In the ED, temperature 98.2 F, HR 96, RR 22, BP 159/120, SPO2 100% on room air.  Sodium 137, potassium 2.8, chloride 96, CO2 24, glucose 121, BUN 12, creatinine 0.92.  Anion gap 17, alkaline phosphatase 262, albumin 3.2, lipase 319, AST 99, ALT 41, total bilirubin 2.0.  High sensitive troponin 15.  WBC 7.6, hemoglobin 14.8, platelets 198.  COVID-19 PCR negative.  Influenza A/B PCR negative.  Urinalysis with moderate leukocytes, negative nitrite, rare bacteria, 0-5 WBCs.  Chest x-ray with no active cardiopulmonary disease process.  CT abdomen/pelvis with mild intra/extrahepatic ductal dilation with common bile duct measuring 1.2 cm; no other acute findings.  EDP consulted [facility] for further evaluation management of nausea/vomiting, abdominal pain secondary to pancreatitis.   Assessment & Plan:   Principal Problem:   Gallstone pancreatitis Active Problems:   Gout   Dilated cbd, acquired   Alcohol dependence with uncomplicated withdrawal (HCC)   Chronic pain syndrome   Benign essential HTN   Acute EtOH pancreatitis, mild Patient presenting to the ED with 3-week history of intermittent nausea/vomiting associated with epigastric pain.  Patient was noted to have elevated lipase of 319 with notable intra/extrahepatic ductal dilation with CBD dilation of 1.2 cm.   Etiology likely secondary to history of chronic active alcohol abuse.  MRCP 8/20 with no pancreatic edema, persistent dilation biliary tree without clear cause, no filling defect with question of stricture/recently passed calculus versus sphincter of Oddi dysfunction, variant pancreatic ductal anatomy with drainage of pancreas through minor papilla. --[facility] following; appreciate assistance --Diet advanced to soft today --Dilaudid 0.5-1 mg IV every 2 hours as needed moderate/severe pain --May benefit from outpatient EUS to rule out distal biliary stricture and outpatient colonoscopy --Supportive care, antiemetics  Transaminitis/hyperbilirubinemia likely 2/2 EtOH abuse EtOH hepatitis: Resolved CT abdomen/pelvis notable for intra/extrahepatic ductal dilation with common bile duct 1.2 cm, no gallstones appreciated within the gallbladder.  Elgie likely secondary to alcohol abuse. --AST 99>83>55>76>36 --ALT 41>33>29>41>33 --Tbili 2.0>1.9>1.1>0.7>0.4 --Repeat CMP in the am  Left foot pain 2/2 gout? Patient complaining of left foot pain and associated swelling.  Difficulty ambulating.  Uric acid 5.1.  Foot x-ray with no acute displaced fracture, stable small erosions base first/second proximal phalanges nonspecific; inflammatory versus crystalline arthropathy could be considered, stable osteoarthritis with mild diffuse soft tissue swelling.  CRP elevated 7.8.  Questionable gout, likely from excessive alcohol abuse. --CRP 7.8>4.0>1.4 --Prednisone 40 mg p.o. daily; plan slow outpatient taper on discharge --Trend CRP --PT following, currently recommend home health PT (per SW, [facility] case, so don't know if will get) --DME rollator ordered  Hypokalemia: Resolved Potassium 2.8 on presentation.  Etiology likely from poor oral intake in the setting of nausea/vomiting.  Repleted. --Potassium 4.4 today.  Hypomagnesemia Repleted during hospitalization.  Essential hypertension BP 164/89 this  morning. --Clonidine 0.2 transdermal weekly --Start losartan 100 mg p.o. daily --  Continue monitor BP closely  Chronic pain syndrome --Continue Suboxone 4 mg p.o. daily (currently buys off street) --PT following with recommendations of home health PT on discharge  Hx IVDU Patient reports previous history of heroin abuse.  States has been in remission for 15 years.  Denies any active illicit drug use. --SW for assistance with placement in outpatient substance abuse program  EtOH use disorder Patient reports significant alcohol use, has been trying to decrease amount/hard liquor she has been utilizing.  Patient does report tremors/shakes when no alcohol use.  Last alcohol use was a day of ED presentation on 8/19.  Denies any history of seizures.  Told on need for complete cessation. --CIWAA protocol with symptom triggered Ativan --Thiamine, multivitamin, folic acid --Monitor on telemetry  Tobacco use disorder: Counseled on need for complete cessation  Moderate protein calorie malnutrition Body mass index is 17.85 kg/m.  Patient very thin in appearance with muscle wasting/fat loss.  Likely secondary to EtOH dependence. Nutrition Status: Nutrition Problem: Inadequate oral intake Etiology: decreased appetite Signs/Symptoms: per patient/family report Interventions: Ensure Enlive (each supplement provides 350kcal and 20 grams of protein), MVI -- Dietitian following, appreciate assistance; continue to encourage increased oral intake, supplement use.    DVT prophylaxis: SCDs Start: 04/09/21 2016   Code Status: Full Code Family Communication: No family present at bedside this morning;   Disposition Plan:  Level of care: Telemetry Status is: Inpatient  Remains inpatient appropriate because:Ongoing diagnostic testing needed not appropriate for outpatient work up, Unsafe d/c plan, IV treatments appropriate due to intensity of illness or inability to take PO, and Inpatient level of care  appropriate due to severity of illness  Dispo: The patient is from: Home              Anticipated d/c is to: Home on 8/24 with home health if available and Rollator; outpatient substance abuse counseling              Patient currently is not medically stable to d/c.  Continue to work on blood pressure control   Difficult to place patient No   Consultants:  [facility] gastroenterology, Dr. Alessandra Bevels and Dr. Collene Mares -  signed off 8/22  Procedures:  MRCP  Antimicrobials:  None   Subjective: Patient seen examined at bedside, resting comfortably.  LFTs now within normal limits.  GI signed off yesterday.  Continues to report pain and swelling to left foot, but able to ambulate well with the use of Rollator yesterday.  Requesting Rollator for home use.  Appreciated of the care that she is receiving here.  Discussed with her that she once again needs to stop all alcohol use and tobacco use.  Discussed plan for discharge home tomorrow as long as blood pressure better controlled.  No other questions or concerns at this time. Denies headache, no chest pain, no shortness of breath, no abdominal pain, no fever/chills/night sweats, no nausea/vomiting/diarrhea.  No acute events overnight per nursing staff.  Objective: Vitals:   04/12/21 1500 04/12/21 1935 04/12/21 2038 04/13/21 0406  BP: (!) 182/100 (!) 184/98 (!) 167/90 (!) 164/89  Pulse: 68 70  63  Resp: 20 20  19   Temp: 98.2 F (36.8 C) 98.6 F (37 C)  98 F (36.7 C)  TempSrc: Oral Oral  Oral  SpO2: 98% 99%  99%  Weight:      Height:        Intake/Output Summary (Last 24 hours) at 04/13/2021 1030 Last data filed at 04/13/2021 0750 Gross per  24 hour  Intake 940 ml  Output --  Net 940 ml   Filed Weights   04/09/21 1044 04/09/21 1052  Weight: 51.7 kg 51.7 kg    Examination:  General exam: Appears anxious, no acute distress, thin in appearance, appears older than stated age Respiratory system: Clear to auscultation. Respiratory effort  normal.  On room air Cardiovascular system: S1 & S2 heard, RRR. No JVD, murmurs, rubs, gallops or clicks.  Mild left foot edema Gastrointestinal system: Abdomen is slightly tender to epigastric region, nondistended, soft. No organomegaly or masses felt. Normal bowel sounds heard. Central nervous system: Alert and oriented. No focal neurological deficits. Extremities: Symmetric 5 x 5 power. Skin: No rashes, lesions or ulcers Psychiatry: Judgement and insight appear poor.  Anxious mood; affect appropriate.     Data Reviewed: I have personally reviewed following labs and imaging studies  CBC: Recent Labs  Lab 04/09/21 1109 04/10/21 0525  WBC 7.6 4.8  NEUTROABS 6.0  --   HGB 14.8 11.5*  HCT 41.7 33.6*  MCV 102.5* 103.7*  PLT 198 237   Basic Metabolic Panel: Recent Labs  Lab 04/09/21 1109 04/10/21 0525 04/11/21 0448 04/12/21 0437 04/13/21 0443  NA 137 134* 134* 140 135  K 2.8* 3.5 3.7 4.5 4.4  CL 96* 100 105 109 104  CO2 24 25 23 26 25   GLUCOSE 121* 124* 89 150* 119*  BUN 12 13 13 14 20   CREATININE 0.92 0.79 0.69 0.86 0.71  CALCIUM 9.4 8.0* 7.7* 8.5* 8.7*  MG  --  1.8 1.5* 1.9 1.9  PHOS  --  4.3  --   --   --    GFR: Estimated Creatinine Clearance: 67.9 mL/min (by C-G formula based on SCr of 0.71 mg/dL). Liver Function Tests: Recent Labs  Lab 04/09/21 1109 04/10/21 0525 04/11/21 0448 04/12/21 0437 04/13/21 0443  AST 99* 83* 55* 76* 36  ALT 41 33 29 41 33  ALKPHOS 262* 209* 176* 202* 163*  BILITOT 2.0* 1.9* 1.1 0.7 0.4  PROT 7.4 5.5* 4.8* 5.5* 5.4*  ALBUMIN 3.2* 2.4* 2.1* 2.4* 2.4*   Recent Labs  Lab 04/09/21 1109 04/10/21 0525  LIPASE 319* 272*   No results for input(s): AMMONIA in the last 168 hours. Coagulation Profile: No results for input(s): INR, PROTIME in the last 168 hours. Cardiac Enzymes: No results for input(s): CKTOTAL, CKMB, CKMBINDEX, TROPONINI in the last 168 hours. BNP (last 3 results) No results for input(s): PROBNP in the last 8760  hours. HbA1C: No results for input(s): HGBA1C in the last 72 hours. CBG: No results for input(s): GLUCAP in the last 168 hours. Lipid Profile: Recent Labs    04/10/21 1335  CHOL 167  HDL 79  LDLCALC 79  TRIG 46  CHOLHDL 2.1   Thyroid Function Tests: No results for input(s): TSH, T4TOTAL, FREET4, T3FREE, THYROIDAB in the last 72 hours.  Anemia Panel: No results for input(s): VITAMINB12, FOLATE, FERRITIN, TIBC, IRON, RETICCTPCT in the last 72 hours. Sepsis Labs: No results for input(s): PROCALCITON, LATICACIDVEN in the last 168 hours.  Recent Results (from the past 240 hour(s))  Resp Panel by RT-PCR (Flu A&B, Covid) Nasopharyngeal Swab     Status: None   Collection Time: 04/09/21  5:32 PM   Specimen: Nasopharyngeal Swab; Nasopharyngeal(NP) swabs in vial transport medium  Result Value Ref Range Status   SARS Coronavirus 2 by RT PCR NEGATIVE NEGATIVE Final    Comment: (NOTE) SARS-CoV-2 target nucleic acids are NOT DETECTED.  The SARS-CoV-2  RNA is generally detectable in upper respiratory specimens during the acute phase of infection. The lowest concentration of SARS-CoV-2 viral copies this assay can detect is 138 copies/mL. A negative result does not preclude SARS-Cov-2 infection and should not be used as the sole basis for treatment or other patient management decisions. A negative result may occur with  improper specimen collection/handling, submission of specimen other than nasopharyngeal swab, presence of viral mutation(s) within the areas targeted by this assay, and inadequate number of viral copies(<138 copies/mL). A negative result must be combined with clinical observations, patient history, and epidemiological information. The expected result is Negative.  Fact Sheet for Patients:  EntrepreneurPulse.com.au  Fact Sheet for Healthcare Providers:  IncredibleEmployment.be  This test is no t yet approved or cleared by the [place] FDA and  has been authorized for detection and/or diagnosis of SARS-CoV-2 by FDA under an Emergency Use Authorization (EUA). This EUA will remain  in effect (meaning this test can be used) for the duration of the COVID-19 declaration under Section 564(b)(1) of the Act, 21 U.S.C.section 360bbb-3(b)(1), unless the authorization is terminated  or revoked sooner.       Influenza A by PCR NEGATIVE NEGATIVE Final   Influenza B by PCR NEGATIVE NEGATIVE Final    Comment: (NOTE) The Xpert Xpress SARS-CoV-2/FLU/RSV plus assay is intended as an aid in the diagnosis of influenza from Nasopharyngeal swab specimens and should not be used as a sole basis for treatment. Nasal washings and aspirates are unacceptable for Xpert Xpress SARS-CoV-2/FLU/RSV testing.  Fact Sheet for Patients: EntrepreneurPulse.com.au  Fact Sheet for Healthcare Providers: IncredibleEmployment.be  This test is not yet approved or cleared by the [place] FDA and has been authorized for detection and/or diagnosis of SARS-CoV-2 by FDA under an Emergency Use Authorization (EUA). This EUA will remain in effect (meaning this test can be used) for the duration of the COVID-19 declaration under Section 564(b)(1) of the Act, 21 U.S.C. section 360bbb-3(b)(1), unless the authorization is terminated or revoked.  Performed at [facility], [place] [address], [place], [place] [address]          Radiology Studies: No results found.      Scheduled Meds:  amLODipine  5 mg Oral Daily   buprenorphine  4 mg Sublingual Daily   cloNIDine  0.2 mg Transdermal Weekly   feeding supplement  237 mL Oral TID BM   folic acid  1 mg Oral Daily   gabapentin  300 mg Oral TID   multivitamin with minerals  1 tablet Oral Daily   nicotine  21 mg Transdermal Daily   predniSONE  40 mg Oral Q breakfast   sodium chloride flush  3 mL Intravenous Q12H   thiamine  100 mg Oral Daily    Or   thiamine  100 mg Intravenous Daily   Continuous Infusions:     LOS: 4 days    Time spent: 38 minutes spent on chart review, discussion with nursing staff, consultants, updating family and interview/physical exam; more than 50% of that time was spent in counseling and/or coordination of care.    Florence Yeung J [place] DO [facility] Available via Epic secure chat 7am-7pm After these hours, please refer to coverage provider listed on [email or web address] 04/13/2021, 10:30 AM

## 2021-04-14 ENCOUNTER — Other Ambulatory Visit: Payer: Self-pay

## 2021-04-14 DIAGNOSIS — I10 Essential (primary) hypertension: Secondary | ICD-10-CM

## 2021-04-14 DIAGNOSIS — R1010 Upper abdominal pain, unspecified: Secondary | ICD-10-CM

## 2021-04-14 LAB — COMPREHENSIVE METABOLIC PANEL
ALT: 32 U/L (ref 0–44)
AST: 31 U/L (ref 15–41)
Albumin: 2.5 g/dL — ABNORMAL LOW (ref 3.5–5.0)
Alkaline Phosphatase: 142 U/L — ABNORMAL HIGH (ref 38–126)
Anion gap: 5 (ref 5–15)
BUN: 20 mg/dL (ref 6–20)
CO2: 27 mmol/L (ref 22–32)
Calcium: 8.9 mg/dL (ref 8.9–10.3)
Chloride: 105 mmol/L (ref 98–111)
Creatinine, Ser: 0.71 mg/dL (ref 0.44–1.00)
GFR, Estimated: >60 mL/min (ref >60)
Glucose, Bld: 85 mg/dL (ref 70–99)
Potassium: 4.7 mmol/L (ref 3.5–5.1)
Sodium: 137 mmol/L (ref 135–145)
Total Bilirubin: 0.6 mg/dL (ref 0.3–1.2)
Total Protein: 5.3 g/dL — ABNORMAL LOW (ref 6.5–8.1)

## 2021-04-14 LAB — C-REACTIVE PROTEIN: CRP: 0.7 mg/dL (ref <1.0)

## 2021-04-14 MED ORDER — POLYETHYLENE GLYCOL 3350 17 G PO PACK
17.0000 g | PACK | Freq: Every day | ORAL | 0 refills | Status: DC | PRN
Start: 2021-04-14 — End: 2021-04-21

## 2021-04-14 MED ORDER — PREDNISONE 10 MG PO TABS
ORAL_TABLET | ORAL | 0 refills | Status: DC
  Filled 2021-04-14: qty 18, 9d supply, fill #0

## 2021-04-14 MED ORDER — FOLIC ACID 1 MG PO TABS
1.0000 mg | ORAL_TABLET | Freq: Every day | ORAL | 0 refills | Status: DC
  Filled 2021-04-14: qty 39, 39d supply, fill #0

## 2021-04-14 MED ORDER — GABAPENTIN 300 MG PO CAPS: 300.0000 mg | ORAL_CAPSULE | Freq: Three times a day (TID) | ORAL | 1 refills | Status: DC

## 2021-04-14 MED ORDER — GABAPENTIN 300 MG PO CAPS
300.0000 mg | ORAL_CAPSULE | Freq: Three times a day (TID) | ORAL | 1 refills | Status: DC
  Filled 2021-04-14: qty 90, 30d supply, fill #0

## 2021-04-14 MED ORDER — POLYETHYLENE GLYCOL 3350 17 G PO PACK
17.0000 g | PACK | Freq: Every day | ORAL | 0 refills | Status: DC | PRN
  Filled 2021-04-14: qty 30, 30d supply, fill #0

## 2021-04-14 MED ORDER — FOLIC ACID 1 MG PO TABS
1.0000 mg | ORAL_TABLET | Freq: Every day | ORAL | 0 refills | Status: DC
Start: 2021-04-15 — End: 2022-09-19

## 2021-04-14 MED ORDER — LOSARTAN POTASSIUM 100 MG PO TABS: 100.0000 mg | ORAL_TABLET | Freq: Every day | ORAL | 3 refills | Status: DC

## 2021-04-14 MED ORDER — FLEET ENEMA 7-19 GM/118ML RE ENEM
1.0000 | ENEMA | Freq: Once | RECTAL | Status: DC
  Filled 2021-04-14: qty 1

## 2021-04-14 MED ORDER — THIAMINE HCL 100 MG PO TABS
100.0000 mg | ORAL_TABLET | Freq: Every day | ORAL | 0 refills | Status: DC
Start: 2021-04-15 — End: 2022-01-04

## 2021-04-14 MED ORDER — PREDNISONE 10 MG PO TABS: ORAL_TABLET | ORAL | 0 refills | Status: DC

## 2021-04-14 MED ORDER — LOSARTAN POTASSIUM 100 MG PO TABS
100.0000 mg | ORAL_TABLET | Freq: Every day | ORAL | 3 refills | Status: DC
  Filled 2021-04-14: qty 30, 30d supply, fill #0

## 2021-04-14 MED ORDER — THIAMINE HCL 100 MG PO TABS
100.0000 mg | ORAL_TABLET | Freq: Every day | ORAL | 0 refills | Status: DC
  Filled 2021-04-14: qty 30, 30d supply, fill #0

## 2021-04-14 NOTE — Discharge Summary (Signed)
Physician Discharge Summary   Patient ID: [name] MRN: [id number] DOB/AGE: 08/31/1970 50 y.o.  Admit date: 04/09/2021 Discharge date: 04/14/2021  Primary Care Physician:  Kerin Perna, NP   Recommendations for Outpatient Follow-up:  Follow up with PCP in 1-2 weeks  Home Health: Home health Equipment/Devices: DME rolling walker with seat  Discharge Condition: stable CODE STATUS: FULL  Diet recommendation: solid    Discharge Diagnoses:   Acute alcoholic pancreatitis, mild Transaminitis with hyperbilirubinemia secondary to alcohol abuse Alcoholic hepatitis, resolved Acute gout left foot Essential hypertension Hypomagnesemia Hypokalemia Tobacco use Alcohol use Moderate protein calorie malnutrition  Consults: None    Allergies:   Allergies  Allergen Reactions   Penicillins Hives     DISCHARGE MEDICATIONS: Allergies as of 04/14/2021       Reactions   Penicillins Hives        Medication List     STOP taking these medications    hydrALAZINE 50 MG tablet Commonly known as: APRESOLINE       TAKE these medications    BC Fast Pain Relief 845-65 MG Pack Generic drug: Aspirin-Caffeine Take 1 Package by mouth daily as needed (pain).   cloNIDine 0.2 mg/24hr patch Commonly known as: CATAPRES - Dosed in mg/24 hr Place 1 patch (0.2 mg total) onto the skin once a week.   folic acid 1 MG tablet Commonly known as: FOLVITE Take 1 tablet (1 mg total) by mouth daily. Start taking on: April 15, 2021   gabapentin 300 MG capsule Commonly known as: NEURONTIN Take 1 capsule (300 mg total) by mouth 3 (three) times daily.   losartan 100 MG tablet Commonly known as: COZAAR Take 1 tablet (100 mg total) by mouth daily. Start taking on: April 15, 2021   ondansetron 4 MG disintegrating tablet Commonly known as: Zofran ODT Take 1 tablet (4 mg total) by mouth every 8 (eight) hours as needed for nausea or vomiting.   polyethylene glycol 17 g  packet Commonly known as: MIRALAX / GLYCOLAX Take 17 g by mouth daily as needed for mild constipation or moderate constipation.   potassium chloride SA 20 MEQ tablet Commonly known as: KLOR-CON Take 1 tablet (20 mEq total) by mouth daily.   predniSONE 10 MG tablet Commonly known as: DELTASONE Prednisone dosing: Take  Prednisone 14m (3 tabs) x 3 days, then taper to 275m(2 tabs) x 3days, then 107m1 tab) x 3days, then OFF.   SUBOXONE SL Place 4 mg under the tongue daily.   thiamine 100 MG tablet Take 1 tablet (100 mg total) by mouth daily. Start taking on: April 15, 2021               Durable Medical Equipment  (From admission, onward)           Start     Ordered   04/13/21 [id number] home use only DME 4 wheeled rolling walker with seat  Once       Question:  Patient needs a walker to treat with the following condition  Answer:  Gait disturbance   04/13/21 0838             Brief H and P: For complete details please refer to admission H and P, but in brief patient is a [age]ar old female with history of hypertension, alcohol use disorder presented to ED with nausea vomiting, epigastric abdominal pain, chills and poor oral intake.  Patient reported nausea and vomiting, intermittent over the last 3 weeks.  She drinks about half fifth of liquor every day, last drink was on the morning of ED presentation Lipase 319   Hospital Course:  Acute alcohol pancreatitis, mild -Improved, presented with 3-week history of intermittent nausea vomiting, epigastric pain.  Lipase 319 with intra/extrahepatic ductal dilatation with CBD dilation of 1.2 cm. -MRCP showed no pancreatic edema, persistent dilation biliary tree without clear cause, no filling defect with question of stricture/recently passed calculus versus sphincter of Oddi dysfunction, awaiting pancreatic ductal anatomy with drainage of pancreas through minor papula -Gastroenterology was consulted, currently resolved,  tolerating soft diet, ambulating without any difficulty -May benefit from outpatient EUS to rule out distal biliary stricture.  Per GI, ductal dilatation suspected from chronic alcohol use  Hypokalemia, hypomagnesemia, moderate protein calorie malnutrition -Potassium 2.8 on presentation, likely from poor oral intake in the setting of nausea vomiting, alcohol use -Replaced, potassium 4.7, magnesium 1.9 at the time of discharge.  Acute left foot gout -Left foot x-ray did not show any fractures or dislocation.  ESR 28, CRP 7.8.  Patient was started on prednisone taper.  CRP improved to 0.7.  Uric acid 5.1 -Continue prednisone taper    Alcohol dependence with uncomplicated withdrawal ([place]) -Currently improved, no acute withdrawals.  Continue thiamine, folate. -Counseled strongly on alcohol cessation.    Chronic pain syndrome -Continue Suboxone per outpatient dose    Benign essential HTN -Continue clonidine patch, started on losartan  Constipation Resolved, had a BM this morning prior to discharge   Day of Discharge S: No acute complaints, ambulating with the help of a rolling walker.  Had constipation in the morning which resolved.  No nausea vomiting, abdominal pain.  BP (!) 165/83   Pulse (!) 58   Temp 97.8 F (36.6 C) (Oral)   Resp 17   Ht 5' 7" (1.702 m)   Wt 51.7 kg   SpO2 100%   BMI 17.85 kg/m   Physical Exam: General: Alert and awake oriented x3 not in any acute distress. CVS: S1-S2 clear no murmur rubs or gallops Chest: clear to auscultation bilaterally, no wheezing rales or rhonchi Abdomen: soft nontender, nondistended, normal bowel sounds Extremities: no cyanosis, clubbing or edema noted bilaterally Neuro: Cranial nerves II-XII intact, no focal neurological deficits    Get Medicines reviewed and adjusted: Please take all your medications with you for your next visit with your Primary MD  Please request your Primary MD to go over all hospital tests and  procedure/radiological results at the follow up. Please ask your Primary MD to get all Hospital records sent to his/her office.  If you experience worsening of your admission symptoms, develop shortness of breath, life threatening emergency, suicidal or homicidal thoughts you must seek medical attention immediately by calling 911 or calling your MD immediately  if symptoms less severe.  You must read complete instructions/literature along with all the possible adverse reactions/side effects for all the Medicines you take and that have been prescribed to you. Take any new Medicines after you have completely understood and accept all the possible adverse reactions/side effects.   Do not drive when taking pain medications.   Do not take more than prescribed Pain, Sleep and Anxiety Medications  Special Instructions: If you have smoked or chewed Tobacco  in the last 2 yrs please stop smoking, stop any regular Alcohol  and or any Recreational drug use.  Wear Seat belts while driving.  Please note  You were cared for by a hospitalist during your hospital stay. Once you are  discharged, your primary care physician will handle any further medical issues. Please note that NO REFILLS for any discharge medications will be authorized once you are discharged, as it is imperative that you return to your primary care physician (or establish a relationship with a primary care physician if you do not have one) for your aftercare needs so that they can reassess your need for medications and monitor your lab values.   The results of significant diagnostics from this hospitalization (including imaging, microbiology, ancillary and laboratory) are listed below for reference.      Procedures/Studies:  CT ABDOMEN PELVIS W CONTRAST  Result Date: 04/09/2021 CLINICAL DATA:  Nausea/vomiting Epigastric pain EXAM: CT ABDOMEN AND PELVIS WITH CONTRAST TECHNIQUE: Multidetector CT imaging of the abdomen and pelvis was  performed using the standard protocol following bolus administration of intravenous contrast. CONTRAST:  36m OMNIPAQUE IOHEXOL 350 MG/ML SOLN COMPARISON:  Ultrasound 04/07/2021, CT abdomen pelvis 04/24/2009 FINDINGS: Lower chest: Left basilar linear atelectasis. Hepatobiliary: No focal liver abnormality. Likely perfusional anomaly in the quadrate. The gallbladder is unremarkable. There is mild intrahepatic and extrahepatic ductal dilation with common duct measuring up to 1.2 cm. Pancreas: Unremarkable. No pancreatic ductal dilatation or surrounding inflammatory changes. Spleen: Normal in size without focal abnormality. Adrenals/Urinary Tract: Adrenal glands are unremarkable. Multifocal right renal cortical scarring. No hydronephrosis. The left kidney is unremarkable with exception of multiple small subcentimeter renal cysts. No nephroureterolithiasis. The bladder is well distended and unremarkable. Stomach/Bowel: Stomach is within normal limits. There is no evidence of bowel obstruction. Prior appendectomy. Vascular/Lymphatic: Aortoiliac atherosclerotic calcifications. No AAA. No lymphadenopathy. Reproductive: Unremarkable. Other: No abdominal wall hernia or abnormality. No abdominopelvic ascites. Musculoskeletal: No acute or significant osseous findings. IMPRESSION: Mild intra and extrahepatic ductal dilation with common duct measuring up to 1.2 cm. Correlate with laboratory findings and consider MRCP if clinically indicated. No other acute findings.  Multifocal right renal cortical scarring. Electronically Signed   By: JMaurine SimmeringM.D.   On: 04/09/2021 17:13   MR 3D Recon At Scanner  Result Date: 04/10/2021 CLINICAL DATA:  Suspected pancreatitis in a 50year old female. EXAM: MRI ABDOMEN WITHOUT AND WITH CONTRAST (INCLUDING MRCP) TECHNIQUE: Multiplanar multisequence MR imaging of the abdomen was performed both before and after the administration of intravenous contrast. Heavily T2-weighted images of the  biliary and pancreatic ducts were obtained, and three-dimensional MRCP images were rendered by post processing. CONTRAST:  554mGADAVIST GADOBUTROL 1 MMOL/ML IV SOLN COMPARISON:  CT of the abdomen and pelvis from April 09, 2021. FINDINGS: Lower chest: Incidental imaging of the lung bases is unremarkable on MRI. Hepatobiliary: Mild hepatic steatosis. No focal hepatic lesion. Intra and extrahepatic biliary duct distension is similar to the recent CT with moderate dilation, no filling defect or visible obstructing lesion. No pericholecystic stranding. Tapered appearance of distal common bile duct. No focal, suspicious hepatic lesion. Portal vein is patent. Hepatic veins are patent. Pancreas: Normal intrinsic T1 signal of the pancreas. Pancreatic drainage via minor papilla. No signs of peripancreatic edema. Normal pancreatic enhancement without focal pancreatic lesion. Spleen:  Normal spleen. Adrenals/Urinary Tract:  Adrenal glands are normal. Symmetric renal enhancement though with scarring of the RIGHT renal parenchyma as on the previous CT. Signs of bilateral renal cysts, some with hemorrhage less than a cm size and greatest on the LEFT. No perinephric stranding or hydronephrosis. Stomach/Bowel: Query mild colonic edema and thickening. Some submucosal fat deposition along the colon. Vascular/Lymphatic: Patent abdominal vasculature. There is no gastrohepatic or hepatoduodenal ligament lymphadenopathy. No retroperitoneal  or mesenteric lymphadenopathy. Other:  No ascites Musculoskeletal: No suspicious bone lesions identified. IMPRESSION: 1. Normal intrinsic T1 signal in the pancreas without pancreatic edema by MRI. 2. Persistent dilation of the biliary tree without clear cause or filling defect with tapered appearance. Could be seen in the setting of stricture, recently passed calculus or sphincter of Oddi dysfunction. 3. Variant pancreatic ductal anatomy with drainage of pancreas via minor papilla but without ductal  dilation. 4. Mild hepatic steatosis. 5. Query mild colonic edema and thickening. Correlate with any signs of colitis. 6. Renal cortical scarring RIGHT greater than LEFT. Electronically Signed   By: Zetta Bills M.D.   On: 04/10/2021 13:53   DG Chest Portable 1 View  Result Date: 04/09/2021 CLINICAL DATA:  chest heaviness EXAM: PORTABLE CHEST 1 VIEW COMPARISON:  10/06/2009, 02/08/2009 FINDINGS: The heart size and mediastinal contours are within normal limits. Bilateral nipple shadows, as seen on prior studies. Both lungs are clear. The visualized skeletal structures are unremarkable. IMPRESSION: No active disease. Electronically Signed   By: Davina Poke D.O.   On: 04/09/2021 12:20   DG Foot Complete Left  Result Date: 04/10/2021 CLINICAL DATA:  Chronic left foot pain, difficulty ambulating EXAM: LEFT FOOT - COMPLETE 3+ VIEW COMPARISON:  01/08/2021 FINDINGS: Frontal, oblique, and lateral views of the left foot are obtained. No acute displaced fracture. Mild osteoarthritis of the first metatarsophalangeal joint is again noted. Small erosive changes are seen along the lateral aspect of the base of the first and second proximal phalanges, nonspecific. No other erosive changes. Intraosseous lipoma versus bone cyst within the calcaneus unchanged. Stable osteoarthritis within the midfoot and hindfoot. Mild diffuse soft tissue swelling has developed in the interim. IMPRESSION: 1. No acute displaced fracture. 2. Subtle small erosions at the base of the first and second proximal phalanges as above, nonspecific. Inflammatory or crystalline arthropathy could be considered in the appropriate setting. 3. Stable osteoarthritis. 4. Mild diffuse soft tissue swelling. Electronically Signed   By: Randa Ngo M.D.   On: 04/10/2021 15:32   MR ABDOMEN MRCP W WO CONTAST  Result Date: 04/10/2021 CLINICAL DATA:  Suspected pancreatitis in a 50 year old female. EXAM: MRI ABDOMEN WITHOUT AND WITH CONTRAST (INCLUDING MRCP)  TECHNIQUE: Multiplanar multisequence MR imaging of the abdomen was performed both before and after the administration of intravenous contrast. Heavily T2-weighted images of the biliary and pancreatic ducts were obtained, and three-dimensional MRCP images were rendered by post processing. CONTRAST:  36m GADAVIST GADOBUTROL 1 MMOL/ML IV SOLN COMPARISON:  CT of the abdomen and pelvis from April 09, 2021. FINDINGS: Lower chest: Incidental imaging of the lung bases is unremarkable on MRI. Hepatobiliary: Mild hepatic steatosis. No focal hepatic lesion. Intra and extrahepatic biliary duct distension is similar to the recent CT with moderate dilation, no filling defect or visible obstructing lesion. No pericholecystic stranding. Tapered appearance of distal common bile duct. No focal, suspicious hepatic lesion. Portal vein is patent. Hepatic veins are patent. Pancreas: Normal intrinsic T1 signal of the pancreas. Pancreatic drainage via minor papilla. No signs of peripancreatic edema. Normal pancreatic enhancement without focal pancreatic lesion. Spleen:  Normal spleen. Adrenals/Urinary Tract:  Adrenal glands are normal. Symmetric renal enhancement though with scarring of the RIGHT renal parenchyma as on the previous CT. Signs of bilateral renal cysts, some with hemorrhage less than a cm size and greatest on the LEFT. No perinephric stranding or hydronephrosis. Stomach/Bowel: Query mild colonic edema and thickening. Some submucosal fat deposition along the colon. Vascular/Lymphatic: Patent abdominal vasculature. There  is no gastrohepatic or hepatoduodenal ligament lymphadenopathy. No retroperitoneal or mesenteric lymphadenopathy. Other:  No ascites Musculoskeletal: No suspicious bone lesions identified. IMPRESSION: 1. Normal intrinsic T1 signal in the pancreas without pancreatic edema by MRI. 2. Persistent dilation of the biliary tree without clear cause or filling defect with tapered appearance. Could be seen in the  setting of stricture, recently passed calculus or sphincter of Oddi dysfunction. 3. Variant pancreatic ductal anatomy with drainage of pancreas via minor papilla but without ductal dilation. 4. Mild hepatic steatosis. 5. Query mild colonic edema and thickening. Correlate with any signs of colitis. 6. Renal cortical scarring RIGHT greater than LEFT. Electronically Signed   By: Zetta Bills M.D.   On: 04/10/2021 13:53   US Abdomen Limited RUQ (LIVER/GB)  Result Date: 04/07/2021 CLINICAL DATA:  Elevated liver function tests. EXAM: ULTRASOUND ABDOMEN LIMITED RIGHT UPPER QUADRANT COMPARISON:  None. FINDINGS: Gallbladder: No gallstones or wall thickening visualized. No sonographic Murphy sign noted by sonographer. Common bile duct: Diameter: 8 mm. Liver: No focal lesion identified. Possible mild increased parenchymal echogenicity. Portal vein is patent on color Doppler imaging with normal direction of blood flow towards the liver. Other: Malrotated right kidney. IMPRESSION: 1. Common bile duct measures at the upper limits of normal. 2. Possible mild hepatic steatosis. Electronically Signed   By: Iven Finn M.D.   On: 04/07/2021 22:43      LAB RESULTS: Basic Metabolic Panel: Recent Labs  Lab 04/10/21 0525 04/11/21 0448 04/13/21 0443 04/14/21 0518  NA 134*   < > 135 137  K 3.5   < > 4.4 4.7  CL 100   < > 104 105  CO2 25   < > 25 27  GLUCOSE 124*   < > 119* 85  BUN 13   < > 20 20  CREATININE 0.79   < > 0.71 0.71  CALCIUM 8.0*   < > 8.7* 8.9  MG 1.8   < > 1.9  --   PHOS 4.3  --   --   --    < > = values in this interval not displayed.   Liver Function Tests: Recent Labs  Lab 04/13/21 0443 04/14/21 0518  AST 36 31  ALT 33 32  ALKPHOS 163* 142*  BILITOT 0.4 0.6  PROT 5.4* 5.3*  ALBUMIN 2.4* 2.5*   Recent Labs  Lab 04/09/21 1109 04/10/21 0525  LIPASE 319* 272*   No results for input(s): AMMONIA in the last 168 hours. CBC: Recent Labs  Lab 04/09/21 1109 04/10/21 0525  WBC  7.6 4.8  NEUTROABS 6.0  --   HGB 14.8 11.5*  HCT 41.7 33.6*  MCV 102.5* 103.7*  PLT 198 161   Cardiac Enzymes: No results for input(s): CKTOTAL, CKMB, CKMBINDEX, TROPONINI in the last 168 hours. BNP: Invalid input(s): POCBNP CBG: No results for input(s): GLUCAP in the last 168 hours.     Disposition and Follow-up: Discharge Instructions     Diet - low sodium heart healthy   Complete by: As directed    Increase activity slowly   Complete by: As directed         DISPOSITION: Home   DISCHARGE FOLLOW-UP  Follow-up Information     [facility] Follow up.   Why: They work with folks without insurance that are in need of suboxone, support for [employer] information: [address] [place], [place] [address] Hours:  Open  Closes 2:30PM Phone: [phone]  Kerin Perna, NP. Schedule an appointment as soon as possible for a visit in 2 week(s).   Specialty: Internal Medicine Why: for hospital follow-up Contact information: [address] [place] [address] [phone]                  Time coordinating discharge:  35 minutes  Signed:   Estill Cotta M.D. [facility] 04/14/2021, 12:03 PM

## 2021-04-14 NOTE — Progress Notes (Signed)
   04/14/21 1200  Mobility  Activity Ambulated in hall  Level of Assistance Standby assist, set-up cues, supervision of patient - no hands on  Distance Ambulated (ft) 520 ft  Mobility Ambulated with assistance in hallway  Mobility Response Tolerated well  Mobility performed by Mobility specialist  $Mobility charge 1 Mobility   Upon entering the room, pt was very eager to ambulate. Pt IND to EOB and stand. Ambulated in hall about 536f with rollator, tolerated well. She stated that she has been up ambulating on her own. Also noted some ankle pain secondary to this. Overall, pt was satisfied and had no other complaints. Left in bed with call bell at side.   KWaldronSpecialist Acute Rehab Services Office: [phone]

## 2021-04-15 ENCOUNTER — Telehealth: Payer: Self-pay

## 2021-04-15 ENCOUNTER — Other Ambulatory Visit: Payer: Self-pay

## 2021-04-15 ENCOUNTER — Telehealth: Payer: Self-pay | Admitting: *Deleted

## 2021-04-15 NOTE — Telephone Encounter (Signed)
Pharmacy called related to inactive [place] card.  EDCM researched [facility] system to find that pt ID number was entered incorrectly.  EDCM corrected card and stayed on phone to insure card was active. Pt was able to purchase Rx.

## 2021-04-15 NOTE — Telephone Encounter (Signed)
Transition Care Management Follow-up Telephone Call Date of discharge and from where: 04/14/2021, [facility]  How have you been since you were released from the hospital? She said she is doing okay.  Any questions or concerns? Yes - she would like a shower chair. No insurance and no income. Provided her with the phone number for [employer] to see if they have any available.  There is no charge for their DME.   Items Reviewed: Did the pt receive and understand the discharge instructions provided? Yes  Medications obtained and verified?  She has to pick up 5 of the new medications at [facility] today.  She does not plan to get the miralax because she cannot afford it and her bowels have been moving well. She needs to pick up the potassium at [facility]. She did not have any questions about her med regime.  Other? No  Any new allergies since your discharge? No  Do you have support at home?  She lives alone but said that her boyfriend comes to see her often.   Home Care and Equipment/Supplies: Were home health services ordered? no If so, what is the name of the agency? N/a  Has the agency set up a time to come to the patient's home? not applicable Were any new equipment or medical supplies ordered?  Yes: rollator What is the name of the medical supply agency? [employer] Were you able to get the supplies/equipment? yes Do you have any questions related to the use of the equipment or supplies? No  Functional Questionnaire: (I = Independent and D = Dependent) ADLs: independent. Has been using the rollator with ambulation and is very thankful to have received it.   Follow up appointments reviewed:  PCP Hospital f/u appt confirmed? Yes  Scheduled to see Juluis Mire, NP on 04/21/2021 @ 1030. [place] [facility] f/u appt confirmed?  None scheduled at this time   Are transportation arrangements needed?  She may need transportation. Instructed her to call [employer] a couple of days ahead  of time to request [facility] be arranged,  If their condition worsens, is the pt aware to call PCP or go to the Emergency Dept.? Yes Was the patient provided with contact information for the PCP's office or ED? Yes Was to pt encouraged to call back with questions or concerns? Yes

## 2021-04-21 ENCOUNTER — Ambulatory Visit (INDEPENDENT_AMBULATORY_CARE_PROVIDER_SITE_OTHER): Payer: Medicaid Other | Admitting: Primary Care

## 2021-04-21 ENCOUNTER — Encounter (INDEPENDENT_AMBULATORY_CARE_PROVIDER_SITE_OTHER): Payer: Self-pay | Admitting: Primary Care

## 2021-04-21 ENCOUNTER — Other Ambulatory Visit: Payer: Self-pay

## 2021-04-21 VITALS — BP 144/86 | HR 69 | Temp 97.5°F | Ht 67.0 in | Wt 121.6 lb

## 2021-04-21 DIAGNOSIS — Z09 Encounter for follow-up examination after completed treatment for conditions other than malignant neoplasm: Secondary | ICD-10-CM | POA: Diagnosis not present

## 2021-04-21 DIAGNOSIS — I10 Essential (primary) hypertension: Secondary | ICD-10-CM

## 2021-04-21 DIAGNOSIS — Z23 Encounter for immunization: Secondary | ICD-10-CM | POA: Diagnosis not present

## 2021-04-21 DIAGNOSIS — F17210 Nicotine dependence, cigarettes, uncomplicated: Secondary | ICD-10-CM | POA: Diagnosis not present

## 2021-04-21 DIAGNOSIS — Z720 Tobacco use: Secondary | ICD-10-CM

## 2021-04-21 NOTE — Progress Notes (Signed)
[facility]   Subjective:   [name] is a 50 y.o. female presents for hospital follow up. She presented to the emergency department with vomiting, abdominal pain, chills, poor oral intake.  Admitted for intractable nausea, vomiting, pancreatitis, possible gallstone induced, and suspected gout. Past Medical History:  Diagnosis Date   Alcohol dependence ([place])    Hypertension    Kidney stones      Allergies  Allergen Reactions   Penicillins Hives      Current Outpatient Medications on File Prior to Visit  Medication Sig Dispense Refill   cloNIDine (CATAPRES - DOSED IN MG/24 HR) 0.2 mg/24hr patch Place 1 patch (0.2 mg total) onto the skin once a week. 4 patch 12   folic acid (FOLVITE) 1 MG tablet Take 1 tablet (1 mg total) by mouth daily. 30 tablet 0   gabapentin (NEURONTIN) 300 MG capsule Take 1 capsule (300 mg total) by mouth 3 (three) times daily. 90 capsule 1   losartan (COZAAR) 100 MG tablet Take 1 tablet (100 mg total) by mouth daily. 30 tablet 3   predniSONE (DELTASONE) 10 MG tablet Take  3 tablets (17m) by mouth daily x 3 days, 2 tabs (274m daily x 3 days, 1 tab (1055mdaily x 3 days then off 18 tablet 0   thiamine 100 MG tablet Take 1 tablet (100 mg total) by mouth daily. 30 tablet 0   Aspirin-Caffeine (BC FAST PAIN RELIEF) 845-65 MG PACK Take 1 Package by mouth daily as needed (pain).     Buprenorphine HCl-Naloxone HCl (SUBOXONE SL) Place 4 mg under the tongue daily.     potassium chloride SA (KLOR-CON) 20 MEQ tablet Take 1 tablet (20 mEq total) by mouth daily. (Patient not taking: Reported on 04/21/2021) 30 tablet 1   No current facility-administered medications on file prior to visit.     Review of System: Review of Systems  Genitourinary:  Positive for frequency.  Neurological:  Positive for weakness.  All other systems reviewed and are negative.  Objective:  BP (!) 144/86 (BP Location: Right Arm, Patient Position: Sitting, Cuff Size:  Normal)   Pulse 69   Temp (!) 97.5 F (36.4 C) (Temporal)   Ht 5' 7"  (1.702 m)   Wt 121 lb 9.6 oz (55.2 kg)   SpO2 98%   BMI 19.05 kg/m   Filed Weights   04/21/21 1125  Weight: 121 lb 9.6 oz (55.2 kg)   Physical Exam: General Appearance: Well nourished, in no apparent distress. Eyes: PERRLA, EOMs, conjunctiva no swelling or erythema Sinuses: No Frontal/maxillary tenderness ENT/Mouth: Ext aud canals clear, TMs without erythema, bulging. No erythema, swelling, or exudate on post pharynx.  Tonsils not swollen or erythematous. Hearing normal.  Neck: Supple, thyroid normal.  Respiratory: Respiratory effort normal, BS equal bilaterally without rales, rhonchi, wheezing or stridor.  Cardio: RRR with no MRGs. Brisk peripheral pulses without edema.  Abdomen: Soft, + BS.  Non tender, no guarding, rebound, hernias, masses. Lymphatics: Non tender without lymphadenopathy.  Musculoskeletal: Full ROM, 5/5 strength, normal gait.  Skin: Warm, dry without rashes, lesions, ecchymosis.  Neuro: Cranial nerves intact. Normal muscle tone, no cerebellar symptoms. Sensation intact.  Psych: Awake and oriented X 3, normal affect, Insight and Judgment appropriate.    Assessment:  Diagnoses and all orders for this visit:  Need for Tdap vaccination -     Tdap vaccine greater than or equal to 7yo IM  Essential hypertension Blood pressures at home systolic [id number] diastolic  71-93 Counseled on blood pressure goal of less than 130/80, low-sodium, DASH diet, medication compliance, 150 minutes of moderate intensity exercise per week. Discussed medication compliance, adverse effects. Added clonidine patches .53m weekly   Tobacco abuse - I have recommended complete cessation of tobacco use. I have discussed various options available for assistance with tobacco cessation including over the counter methods (Nicotine gum, patch and lozenges). We also discussed prescription options (Chantix, Nicotine Inhaler /  Nasal Spray). The patient is not interested in pursuing any prescription tobacco cessation options at this time. - Patient declines at this time.  Hospital discharge follow-up Retrieved from hospital d/c  [facility] They work with folks without insurance that are in need of suboxone, support for abstinence- Group therapy 04/27/21 [facility] been on suboxone  Schedule an appointment with EKerin Perna NP (Internal Medicine) in 2 weeks (04/28/2021); for hospital follow-up Completed      This note has been created with Dragon speech recognition software and sEngineer, materials Any transcriptional errors are unintentional.   MKerin Perna NP 04/26/2021, 8:56 PM

## 2021-04-21 NOTE — Patient Instructions (Addendum)
Hypertension, Adult High blood pressure (hypertension) is when the force of blood pumping through the arteries is too strong. The arteries are the blood vessels that carry blood from the heart throughout the body. Hypertension forces the heart to work harder to pump blood and may cause arteries to become narrow or stiff. Untreated or uncontrolled hypertension can cause a heart attack, heart failure, a stroke, kidney disease, and other problems. A blood pressure reading consists of a higher number over a lower number. Ideally, your blood pressure should be below 120/80. The first ("top") number is called the systolic pressure. It is a measure of the pressure in your arteries as your heart beats. The second ("bottom") number is called the diastolic pressure. It is a measure of the pressure in your arteries as the heart relaxes. What are the causes? The exact cause of this condition is not known. There are some conditions that result in or are related to high blood pressure. What increases the risk? Some risk factors for high blood pressure are under your control. The following factors may make you more likely to develop this condition: Smoking. Having type 2 diabetes mellitus, high cholesterol, or both. Not getting enough exercise or physical activity. Being overweight. Having too much fat, sugar, calories, or salt (sodium) in your diet. Drinking too much alcohol. Some risk factors for high blood pressure may be difficult or impossible to change. Some of these factors include: Having chronic kidney disease. Having a family history of high blood pressure. Age. Risk increases with age. Race. You may be at higher risk if you are African American. Gender. Men are at higher risk than women before age 24. After age 46, women are at higher risk than men. Having obstructive sleep apnea. Stress. What are the signs or symptoms? High blood pressure may not cause symptoms. Very high blood pressure  (hypertensive crisis) may cause: Headache. Anxiety. Shortness of breath. Nosebleed. Nausea and vomiting. Vision changes. Severe chest pain. Seizures. How is this diagnosed? This condition is diagnosed by measuring your blood pressure while you are seated, with your arm resting on a flat surface, your legs uncrossed, and your feet flat on the floor. The cuff of the blood pressure monitor will be placed directly against the skin of your upper arm at the level of your heart. It should be measured at least twice using the same arm. Certain conditions can cause a difference in blood pressure between your right and left arms. Certain factors can cause blood pressure readings to be lower or higher than normal for a short period of time: When your blood pressure is higher when you are in a health care provider's office than when you are at home, this is called white coat hypertension. Most people with this condition do not need medicines. When your blood pressure is higher at home than when you are in a health care provider's office, this is called masked hypertension. Most people with this condition may need medicines to control blood pressure. If you have a high blood pressure reading during one visit or you have normal blood pressure with other risk factors, you may be asked to: Return on a different day to have your blood pressure checked again. Monitor your blood pressure at home for 1 week or longer. If you are diagnosed with hypertension, you may have other blood or imaging tests to help your health care provider understand your overall risk for other conditions. How is this treated? This condition is treated by making  healthy lifestyle changes, such as eating healthy foods, exercising more, and reducing your alcohol intake. Your health care provider may prescribe medicine if lifestyle changes are not enough to get your blood pressure under control, and if: Your systolic blood pressure is above  130. Your diastolic blood pressure is above 80. Your personal target blood pressure may vary depending on your medical conditions, your age, and other factors. Follow these instructions at home: Eating and drinking  Eat a diet that is high in fiber and potassium, and low in sodium, added sugar, and fat. An example eating plan is called the DASH (Dietary Approaches to Stop Hypertension) diet. To eat this way: Eat plenty of fresh fruits and vegetables. Try to fill one half of your plate at each meal with fruits and vegetables. Eat whole grains, such as whole-wheat pasta, brown rice, or whole-grain bread. Fill about one fourth of your plate with whole grains. Eat or drink low-fat dairy products, such as skim milk or low-fat yogurt. Avoid fatty cuts of meat, processed or cured meats, and poultry with skin. Fill about one fourth of your plate with lean proteins, such as fish, chicken without skin, beans, eggs, or tofu. Avoid pre-made and processed foods. These tend to be higher in sodium, added sugar, and fat. Reduce your daily sodium intake. Most people with hypertension should eat less than 1,500 mg of sodium a day. Do not drink alcohol if: Your health care provider tells you not to drink. You are pregnant, may be pregnant, or are planning to become pregnant. If you drink alcohol: Limit how much you use to: 0-1 drink a day for women. 0-2 drinks a day for men. Be aware of how much alcohol is in your drink. In the [place] one drink equals one 12 oz bottle of beer (355 mL), one 5 oz glass of wine (148 mL), or one 1 oz glass of hard liquor (44 mL). Lifestyle  Work with your health care provider to maintain a healthy body weight or to lose weight. Ask what an ideal weight is for you. Get at least 30 minutes of exercise most days of the week. Activities may include walking, swimming, or biking. Include exercise to strengthen your muscles (resistance exercise), such as Pilates or lifting weights, as  part of your weekly exercise routine. Try to do these types of exercises for 30 minutes at least 3 days a week. Do not use any products that contain nicotine or tobacco, such as cigarettes, e-cigarettes, and chewing tobacco. If you need help quitting, ask your health care provider. Monitor your blood pressure at home as told by your health care provider. Keep all follow-up visits as told by your health care provider. This is important. Medicines Take over-the-counter and prescription medicines only as told by your health care provider. Follow directions carefully. Blood pressure medicines must be taken as prescribed. Do not skip doses of blood pressure medicine. Doing this puts you at risk for problems and can make the medicine less effective. Ask your health care provider about side effects or reactions to medicines that you should watch for. Contact a health care provider if you: Think you are having a reaction to a medicine you are taking. Have headaches that keep coming back (recurring). Feel dizzy. Have swelling in your ankles. Have trouble with your vision. Get help right away if you: Develop a severe headache or confusion. Have unusual weakness or numbness. Feel faint. Have severe pain in your chest or abdomen. Vomit repeatedly. Have trouble  breathing. Summary Hypertension is when the force of blood pumping through your arteries is too strong. If this condition is not controlled, it may put you at risk for serious complications. Your personal target blood pressure may vary depending on your medical conditions, your age, and other factors. For most people, a normal blood pressure is less than 120/80. Hypertension is treated with lifestyle changes, medicines, or a combination of both. Lifestyle changes include losing weight, eating a healthy, low-sodium diet, exercising more, and limiting alcohol. This information is not intended to replace advice given to you by your health care  provider. Make sure you discuss any questions you have with your health care provider. Document Revised: 04/18/2018 Document Reviewed: 04/18/2018 Elsevier Patient Education  2022 [place]. Gout Gout is a condition that causes painful swelling of the joints. Gout is a type of inflammation of the joints (arthritis). This condition is caused by having too much uric acid in the body. Uric acid is a chemical that forms when the body breaks down substances called purines. Purines are important for building body proteins. When the body has too much uric acid, sharp crystals can form and build up inside the joints. This causes pain and swelling. Gout attacks can happen quickly and may be very painful (acute gout). Over time, the attacks can affect more joints and become more frequent (chronic gout). Gout can also cause uric acid to build up under the skin and inside the kidneys. What are the causes? This condition is caused by too much uric acid in your blood. This can happen because: Your kidneys do not remove enough uric acid from your blood. This is the most common cause. Your body makes too much uric acid. This can happen with some cancers and cancer treatments. It can also occur if your body is breaking down too many red blood cells (hemolytic anemia). You eat too many foods that are high in purines. These foods include organ meats and some seafood. Alcohol, especially beer, is also high in purines. A gout attack may be triggered by trauma or stress. What increases the risk? You are more likely to develop this condition if you: Have a family history of gout. Are female and middle-aged. Are female and have gone through menopause. Are obese. Frequently drink alcohol, especially beer. Are dehydrated. Lose weight too quickly. Have an organ transplant. Have lead poisoning. Take certain medicines, including aspirin, cyclosporine, diuretics, levodopa, and niacin. Have kidney disease. Have a skin  condition called psoriasis. What are the signs or symptoms? An attack of acute gout happens quickly. It usually occurs in just one joint. The most common place is the big toe. Attacks often start at night. Other joints that may be affected include joints of the feet, ankle, knee, fingers, wrist, or elbow. Symptoms of this condition may include: Severe pain. Warmth. Swelling. Stiffness. Tenderness. The affected joint may be very painful to touch. Shiny, red, or purple skin. Chills and fever. Chronic gout may cause symptoms more frequently. More joints may be involved. You may also have white or yellow lumps (tophi) on your hands or feet or in other areas near your joints. How is this diagnosed? This condition is diagnosed based on your symptoms, medical history, and physical exam. You may have tests, such as: Blood tests to measure uric acid levels. Removal of joint fluid with a thin needle (aspiration) to look for uric acid crystals. X-rays to look for joint damage. How is this treated? Treatment for this condition has  two phases: treating an acute attack and preventing future attacks. Acute gout treatment may include medicines to reduce pain and swelling, including: NSAIDs. Steroids. These are strong anti-inflammatory medicines that can be taken by mouth (orally) or injected into a joint. Colchicine. This medicine relieves pain and swelling when it is taken soon after an attack. It can be given by mouth or through an IV. Preventive treatment may include: Daily use of smaller doses of NSAIDs or colchicine. Use of a medicine that reduces uric acid levels in your blood. Changes to your diet. You may need to see a dietitian about what to eat and drink to prevent gout. Follow these instructions at home: During a gout attack  If directed, put ice on the affected area: Put ice in a plastic bag. Place a towel between your skin and the bag. Leave the ice on for 20 minutes, 2-3 times a  day. Raise (elevate) the affected joint above the level of your heart as often as possible. Rest the joint as much as possible. If the affected joint is in your leg, you may be given crutches to use. Follow instructions from your health care provider about eating or drinking restrictions. Avoiding future gout attacks Follow a low-purine diet as told by your dietitian or health care provider. Avoid foods and drinks that are high in purines, including liver, kidney, anchovies, asparagus, herring, mushrooms, mussels, and beer. Maintain a healthy weight or lose weight if you are overweight. If you want to lose weight, talk with your health care provider. It is important that you do not lose weight too quickly. Start or maintain an exercise program as told by your health care provider. Eating and drinking Drink enough fluids to keep your urine pale yellow. If you drink alcohol: Limit how much you use to: 0-1 drink a day for women. 0-2 drinks a day for men. Be aware of how much alcohol is in your drink. In the [place] one drink equals one 12 oz bottle of beer (355 mL) one 5 oz glass of wine (148 mL), or one 1 oz glass of hard liquor (44 mL). General instructions Take over-the-counter and prescription medicines only as told by your health care provider. Do not drive or use heavy machinery while taking prescription pain medicine. Return to your normal activities as told by your health care provider. Ask your health care provider what activities are safe for you. Keep all follow-up visits as told by your health care provider. This is important. Contact a health care provider if you have: Another gout attack. Continuing symptoms of a gout attack after 10 days of treatment. Side effects from your medicines. Chills or a fever. Burning pain when you urinate. Pain in your lower back or belly. Get help right away if you: Have severe or uncontrolled pain. Cannot urinate. Summary Gout is painful  swelling of the joints caused by inflammation. The most common site of pain is the big toe, but it can affect other joints in the body. Medicines and dietary changes can help to prevent and treat gout attacks. This information is not intended to replace advice given to you by your health care provider. Make sure you discuss any questions you have with your health care provider. Document Revised: 02/28/2018 Document Reviewed: 02/28/2018 Elsevier Patient Education  [place].

## 2021-04-22 ENCOUNTER — Other Ambulatory Visit: Payer: Self-pay

## 2021-04-22 ENCOUNTER — Ambulatory Visit (INDEPENDENT_AMBULATORY_CARE_PROVIDER_SITE_OTHER): Payer: Medicaid Other | Admitting: Clinical

## 2021-04-22 ENCOUNTER — Institutional Professional Consult (permissible substitution) (INDEPENDENT_AMBULATORY_CARE_PROVIDER_SITE_OTHER): Payer: Self-pay | Admitting: Clinical

## 2021-04-22 DIAGNOSIS — F4323 Adjustment disorder with mixed anxiety and depressed mood: Secondary | ICD-10-CM | POA: Diagnosis not present

## 2021-04-23 ENCOUNTER — Telehealth: Payer: Self-pay | Admitting: Primary Care

## 2021-04-23 NOTE — Telephone Encounter (Signed)
Copied from [place] [id number]. Topic: General - Other >> Apr 23, 2021  4:43 PM Pawlus, Brayton Layman A wrote: Reason for CRM: Pt wanted a call back to go over her work note, pt stated the wording was not correct.

## 2021-04-26 ENCOUNTER — Encounter (INDEPENDENT_AMBULATORY_CARE_PROVIDER_SITE_OTHER): Payer: Self-pay | Admitting: Primary Care

## 2021-04-27 NOTE — BH Specialist Note (Signed)
Integrated Behavioral Health via Telemedicine Visit  04/22/2021 [name] [id number]  Number of [place] visits: 1/6 Session Start time: 11:00am  Session End time: 12:00pm Total time: 60  Referring Provider: PCP Oletta Lamas Patient/Family location: [facility] Provider location: [facility] All persons participating in visit: Pt and LCSWA Types of Service: Individual psychotherapy and Video visit  I connected with Tranae D Wurster via Geologist, engineering  (Video is Tree surgeon) and verified that I am speaking with the correct person using two identifiers. Discussed confidentiality: Yes   I discussed the limitations of telemedicine and the availability of in person appointments.  Discussed there is a possibility of technology failure and discussed alternative modes of communication if that failure occurs.  I discussed that engaging in this telemedicine visit, they consent to the provision of behavioral healthcare and the services will be billed under their insurance.  Patient and/or legal guardian expressed understanding and consented to Telemedicine visit: Yes   Presenting Concerns: Patient and/or family reports the following symptoms/concerns: Pt reports feeling depressed at times, decreased energy, self-esteem disturbances, difficulty concentrating, anxiousness, excessive worrying, and restlessness. Reports that her physical health has changed which has caused her to be unable to work. Reports having a difficult time with being unable to work due to working since childhood. Reports also worrying about finances and maintaining bills. Reports a hx of alcohol use and that she has not used alcohol since prior to entering the hospital and has no intention to use alcohol. Reports a hx of heroin abuse but has not used heroin in over 10-15 years. Duration of problem: 3 months; Severity of problem: moderate  Patient and/or Family's  Strengths/Protective Factors: Concrete supports in place (healthy food, safe environments, etc.)  Goals Addressed: Patient will:  Reduce symptoms of: anxiety and depression   Increase knowledge and/or ability of: coping skills   Demonstrate ability to: Increase healthy adjustment to current life circumstances  Progress towards Goals: Ongoing  Interventions: Interventions utilized:  Mindfulness or Psychologist, educational, CBT Cognitive Behavioral Therapy, Supportive Counseling, and Psychoeducation and/or Health Education Standardized Assessments completed: AUDIT, GAD-7, and PHQ 9  Patient and/or Family Response: Pt receptive to tx. Pt receptive to psychoeducation provided on depression and anxiety. Pt receptive to affirmations provided on pt's progress. Pt receptive to cognitive restructuring to decrease pt worries. Pt will begin utilizing deep breathing exercises, consider gardening again, and continue identifying healthy coping skils.   Assessment: Denies SI/HI. Denies auditory/visual hallucinations. No safety risks. Patient currently experiencing an adjustment reaction due to physical health changes and being unable to work. Pt has been experiencing ankle pain and vomiting which contributed to her hospitalization. Pt has been working since 54 and worries about not working anymore and how she will cope. Pt has hx of substance use and appears to have no desire to use substances again. Pt also has no desire to use alcohol. Pt acknowledges alcohol as a coping skill. .   Patient may benefit from therapy. Pt would also benefit from identifying healthy coping skills to reduce pt's risk of returning to unhealthy coping skills. LCSWA provided psychoeducation on anxiety and depression. LCSWA provided positive affirmations for pt's progress and encouraged pt to utilize positive affirmations. LCSWA encouraged pt to attempt gardening again, as pt mentioned this as a previous coping skill that she stopped  doing, and continue watching television. LCSWA utilized cognitive restructuring to decrease pt worries. LCSWA will fu with pt.   Plan: Follow up with behavioral health clinician  on : 05/06/21 Behavioral recommendations: Utilize deep breathing exercises, consider gardening again, utilize positive affirmations, and continue identifying healthy coping skills.  Referral(s): [place] (In Clinic)  I discussed the assessment and treatment plan with the patient and/or parent/guardian. They were provided an opportunity to ask questions and all were answered. They agreed with the plan and demonstrated an understanding of the instructions.   They were advised to call back or seek an in-person evaluation if the symptoms worsen or if the condition fails to improve as anticipated.  Porschea Borys C Hiilei Gerst, LCSW

## 2021-04-28 ENCOUNTER — Ambulatory Visit (INDEPENDENT_AMBULATORY_CARE_PROVIDER_SITE_OTHER): Payer: Self-pay

## 2021-04-28 ENCOUNTER — Ambulatory Visit (INDEPENDENT_AMBULATORY_CARE_PROVIDER_SITE_OTHER): Payer: Self-pay | Admitting: *Deleted

## 2021-04-28 NOTE — Telephone Encounter (Signed)
Called patient at 6:12 pm no answer . Left message to call office. Patient has to be seen at mobile unit or Urgent care.

## 2021-04-28 NOTE — Telephone Encounter (Signed)
Patient called back and has already been triaged by another NT. Patient reports she can not drive herself to UC or ED as previously recommended. C/o right foot pain and swelling and was able to participate in therapy but only using her left foot and leg. C/o possible "gout" . Patent reports she is using walker to get around . C/o memory issues since being discharged from hospital last week. Virtual My Chart visit scheduled for tomorrow . Care advise given. Patient verbalized understanding of care advise and to call back or go to IC or ED if symptoms worsen.

## 2021-04-28 NOTE — Telephone Encounter (Signed)
Patient called, left VM to return the call to the office to discuss symptoms with a nurse.   Message from [place] sent at 04/28/2021  9:23 AM EDT  Pt reports that she is experiencing swelling in her right foot. Pt stated she feels it is gout because she can hardly stand. Pt requests that a nurse return her call. Cb# [phone]

## 2021-04-28 NOTE — Telephone Encounter (Signed)
Patient called and says she is having pain in her right foot with swelling. She says she slept with ice on it all night last night and it helped with the swelling, but it's still painful. She says the pain is a 10/10. She says she is unable to walk on it due to the pain. She says she saw Sharyn Lull for this last week. No other symptoms except numbness/tingling up her leg from the foot. She says her toes are swollen and the top of the right foot. She says she thinks it's gout. Denies fever. I advised a visit, she asked for virtual due to no gas to come to the office. First available is on 05/07/21, she says that is too far out. I advised UC, E-visit, [facility] location, advised I will send this to [place] and someone will call with her recommendation, care advice given, patient verbalized understanding.   Message from [place] sent at 04/28/2021  9:23 AM EDT  Pt reports that she is experiencing swelling in her right foot. Pt stated she feels it is gout because she can hardly stand. Pt requests that a nurse return her call. Cb# [phone]    Reason for Disposition  [1] Very swollen ankle AND [2] no fever  Answer Assessment - Initial Assessment Questions 1. LOCATION: "Which ankle is swollen?" "Where is the swelling?"     Top of right foot and toes 2. ONSET: "When did the swelling start?"     Yesterday 3. SIZE: "How large is the swelling?"     Able to wear flip flops 4. PAIN: "Is there any pain?" If Yes, ask: "How bad is it?" (Scale 1-10; or mild, moderate, severe)   - NONE (0): no pain.   - MILD (1-3): doesn't interfere with normal activities.    - MODERATE (4-7): interferes with normal activities (e.g., work or school) or awakens from sleep, limping.    - SEVERE (8-10): excruciating pain, unable to do any normal activities, unable to walk.      10 5. CAUSE: "What do you think caused the ankle swelling?"     Gout 6. OTHER SYMPTOMS: "Do you have any other symptoms?" (e.g., fever, chest  pain, difficulty breathing, calf pain)     Numbness/tingling up right leg 7. PREGNANCY: "Is there any chance you are pregnant?" "When was your last menstrual period?"     No  Protocols used: Ankle Swelling-A-AH

## 2021-04-28 NOTE — Telephone Encounter (Signed)
Patient states she does not have insurance- she can not afford to pay. Patient states her car is on empty and she will have a hard time going anywhere. Encouraged patient to be seen- will send message to the office-patient states PT is coming today- she does not think she can do PT- but that is something that they will need to see. Stressed to patient the importance of being evaluated and she may need to go to UC/ED.

## 2021-04-28 NOTE — Telephone Encounter (Signed)
Please advise 

## 2021-04-28 NOTE — Telephone Encounter (Signed)
Reason for Disposition  [1] Swollen foot AND [2] no fever  (Exceptions: localized bump from bunions, calluses, insect bite, sting)  Answer Assessment - Initial Assessment Questions 1. ONSET: "When did the pain start?"      See previous encounter  2. LOCATION: "Where is the pain located?"      Right foot  3. PAIN: "How bad is the pain?"    (Scale 1-10; or mild, moderate, severe)  - MILD (1-3): doesn't interfere with normal activities.   - MODERATE (4-7): interferes with normal activities (e.g., work or school) or awakens from sleep, limping.   - SEVERE (8-10): excruciating pain, unable to do any normal activities, unable to walk.      severe 4. WORK OR EXERCISE: "Has there been any recent work or exercise that involved this part of the body?"      na 5. CAUSE: "What do you think is causing the foot pain?"     Gout  6. OTHER SYMPTOMS: "Do you have any other symptoms?" (e.g., leg pain, rash, fever, numbness)     Pain  7. PREGNANCY: "Is there any chance you are pregnant?" "When was your last menstrual period?"     na  Protocols used: Foot Pain-A-AH

## 2021-04-28 NOTE — Telephone Encounter (Signed)
Second message left by patient. Please advise.

## 2021-04-29 ENCOUNTER — Telehealth (INDEPENDENT_AMBULATORY_CARE_PROVIDER_SITE_OTHER): Payer: Medicaid Other | Admitting: Primary Care

## 2021-04-29 DIAGNOSIS — M79671 Pain in right foot: Secondary | ICD-10-CM

## 2021-04-29 NOTE — Progress Notes (Signed)
[place]  Virtual Visit  I connected with ULONDA KLOSOWSKI, on 04/29/2021 at 5:20 PM through an audio and video application or by and verified that I am speaking with the correct person using two identifiers.   Consent: I discussed the limitations, risks, security and privacy concerns of performing an evaluation and management service by telephone and the availability of in person appointments. I also discussed with the patient that there may be a patient responsible charge related to this service. The patient expressed understanding and agreed to proceed.   Location of Patient: Home   Location of Provider: [facility] participating in Telemedicine visit: Lorissa D Genevive Bi,  NP [place] Aliment , CMA  History of Present Illness: Ms. Astoria Condon. [place] is a 50 years old female that right foot swollen and painful. She is unable staying in    Past Medical History:  Diagnosis Date   Alcohol dependence ([place])    Hypertension    Kidney stones    Allergies  Allergen Reactions   Penicillins Hives    Current Outpatient Medications on File Prior to Visit  Medication Sig Dispense Refill   Aspirin-Caffeine (BC FAST PAIN RELIEF) 845-65 MG PACK Take 1 Package by mouth daily as needed (pain).     Buprenorphine HCl-Naloxone HCl (SUBOXONE SL) Place 4 mg under the tongue daily.     cloNIDine (CATAPRES - DOSED IN MG/24 HR) 0.2 mg/24hr patch Place 1 patch (0.2 mg total) onto the skin once a week. 4 patch 12   folic acid (FOLVITE) 1 MG tablet Take 1 tablet (1 mg total) by mouth daily. 30 tablet 0   gabapentin (NEURONTIN) 300 MG capsule Take 1 capsule (300 mg total) by mouth 3 (three) times daily. 90 capsule 1   losartan (COZAAR) 100 MG tablet Take 1 tablet (100 mg total) by mouth daily. 30 tablet 3   potassium chloride SA (KLOR-CON) 20 MEQ tablet Take 1 tablet (20 mEq total) by mouth daily. (Patient not  taking: Reported on 04/21/2021) 30 tablet 1   predniSONE (DELTASONE) 10 MG tablet Take  3 tablets (21m) by mouth daily x 3 days, 2 tabs (220m daily x 3 days, 1 tab (1054mdaily x 3 days then off 18 tablet 0   thiamine 100 MG tablet Take 1 tablet (100 mg total) by mouth daily. 30 tablet 0   No current facility-administered medications on file prior to visit.    Observations/Objective: There were no vitals taken for this visit.  Right swollen painful foot  Assessment and Plan: Right foot pain Elevate foot, place ice to help with swelling , purchase ted hose , decrease sodium if no improvement go to ED  Follow Up Instructions:   I discussed the assessment and treatment plan with the patient. The patient was provided an opportunity to ask questions and all were answered. The patient agreed with the plan and demonstrated an understanding of the instructions.   The patient was advised to call back or seek an in-person evaluation if the symptoms worsen or if the condition fails to improve as anticipated.     I provided 12 minutes total of non-face-to-face time during this encounter including median intraservice time, reviewing previous notes, investigations, ordering medications, medical decision making, coordinating care and patient verbalized understanding at the end of the visit.    This note has been created with [employer], quantityny transcriptional errors  are unintentional.   Kerin Perna, NP 04/29/2021, 5:20 PM

## 2021-04-30 ENCOUNTER — Telehealth (INDEPENDENT_AMBULATORY_CARE_PROVIDER_SITE_OTHER): Payer: Self-pay | Admitting: Primary Care

## 2021-04-30 ENCOUNTER — Other Ambulatory Visit: Payer: Self-pay

## 2021-04-30 NOTE — Telephone Encounter (Signed)
Pt would like to know what level of hose she should get. 20 or 30 mmHg? Pt needs to get today.

## 2021-05-03 ENCOUNTER — Ambulatory Visit (INDEPENDENT_AMBULATORY_CARE_PROVIDER_SITE_OTHER): Payer: Self-pay

## 2021-05-03 NOTE — Telephone Encounter (Signed)
Left message on voicemail- Ted hose level should be between 15-20.

## 2021-05-03 NOTE — Telephone Encounter (Signed)
Pt c/o severe pain and lesions to the index finger distal joint below the nail. Pt stated that she opened up the initial lesion and pus came out. Now she has swollen finger and 2 additional lesions have erupted and both have white top to them. Pt sounds like she is hurting and moans occasionally. Denies fever or any red streaks on the hand or up the arm.  Care advice given and pt verbalized understanding. Advised pt to get evaluated at the ED. Pt stated she has no transportation. Advised pt to call 911 to assistance. Advised pt that their could be an infection so do not wait to have the finger evaluated. Pt stated that she did not want to leave her dogs and became upset. Advised pt to at least do a virtual visit or and e visit. Pt stated she will try. Instructed pt and repeated it to her that if it is an infection it will get worsen and the pain is already severe. Advised pt that if it is infected and she delays care, the infection could go to her joint or bone. Still unsure if she will go.      Reason for Disposition  [1] Looks infected (spreading redness, red streak, pus) AND [2] severe pain with movement  Answer Assessment - Initial Assessment Questions 1. ONSET: "When did the pain start?"     NT cannot remember what pt stated 2. LOCATION and RADIATION: "Where is the pain located?"  (e.g., fingertip, around nail, joint, entire  finger)      Right index finger at the fist joint below nail 3. SEVERITY: "How bad is the pain?" "What does it keep you from doing?"   (Scale 1-10; or mild, moderate, severe)  - MILD (1-3): doesn't interfere with normal activities.   - MODERATE (4-7): interferes with normal activities or awakens from sleep.  - SEVERE (8-10): excruciating pain, unable to hold a glass of water or bend finger even a little.     severe 4. APPEARANCE: "What does the finger look like?" (e.g., redness, swelling, bruising, pallor)     Redness, swelling with 2 additional lesions that have a  white top 5. WORK OR EXERCISE: "Has there been any recent work or exercise that involved this part (i.e., fingers or hand) of the body?"     no 6. CAUSE: "What do you think is causing the pain?"     Pt doesn't knw 7. AGGRAVATING FACTORS: "What makes the pain worse?" (e.g., using computer)     Touching or using finger 8. OTHER SYMPTOMS: "Do you have any other symptoms?" (e.g., fever, neck pain, numbness)     no 9. PREGNANCY: "Is there any chance you are pregnant?" "When was your last menstrual period?"     N/a  Protocols used: Finger Pain-A-AH

## 2021-05-06 ENCOUNTER — Ambulatory Visit (INDEPENDENT_AMBULATORY_CARE_PROVIDER_SITE_OTHER): Payer: Medicaid Other | Admitting: Clinical

## 2021-05-06 ENCOUNTER — Other Ambulatory Visit: Payer: Self-pay

## 2021-05-06 DIAGNOSIS — F4323 Adjustment disorder with mixed anxiety and depressed mood: Secondary | ICD-10-CM | POA: Diagnosis not present

## 2021-05-09 NOTE — BH Specialist Note (Signed)
Integrated Behavioral Health via Telemedicine Visit  05/06/2021 [name] [id number]  Number of [place] visits: 2/6 Session Start time: 11:30am  Session End time: 12:00pm Total time: 30  Referring Provider: PCP Oletta Lamas Patient/Family location: Home [facility] Provider location: [facility] Office All persons participating in visit: Pt and LCSWA Types of Service: Individual psychotherapy and Telephone visit  I connected with [name] via Telephone and verified that I am speaking with the correct person using two identifiers. Discussed confidentiality: Yes   I discussed the limitations of telemedicine and the availability of in person appointments.  Discussed there is a possibility of technology failure and discussed alternative modes of communication if that failure occurs.  I discussed that engaging in this telemedicine visit, they consent to the provision of behavioral healthcare and the services will be billed under their insurance.  Patient and/or legal guardian expressed understanding and consented to Telemedicine visit: Yes   Presenting Concerns: Patient and/or family reports the following symptoms/concerns: Reports feeling depressed at times, decreased energy, anxiousness, worrying, difficulty relaxing, restlessness, and difficulty concentrating. Reports that she continues to have difficulty adjusting to physical health changes and not being able to work. Reports being stressed about finances. Reports that she was able to get her rent paid by [employer] for September but worries about the following month.  Duration of problem: 3 months; Severity of problem: moderate  Patient and/or Family's Strengths/Protective Factors: Concrete supports in place (healthy food, safe environments, etc.)  Goals Addressed: Patient will:  Reduce symptoms of: anxiety and depression   Increase knowledge and/or ability of: coping skills   Demonstrate ability to: Increase  healthy adjustment to current life circumstances  Progress towards Goals: Ongoing  Interventions: Interventions utilized:  Mindfulness or Psychologist, educational, CBT Cognitive Behavioral Therapy, Supportive Counseling, and Link to [employer] Standardized Assessments completed: Not Needed  Patient and/or Family Response: Pt receptive to tx. Pt receptive to psychoeducation provided on anxiety and depression. Pt receptive to positive affirmations provided for pt's progress. Pt receptive to cognitive restructuring. Pt will continue utilizing deep breathing exercises, spending time with dogs, maintaining plants, and positive affirmations. Pt will contact financial resources to assist with rent and utilities.   Assessment: Denies SI/HI. Denies auditory/visual hallucinations. No safety risks. Patient currently experiencing an adjustment reaction due to physical health changes. Pt continues to have difficulty with not being able to work right now. Pt has worked since childhood and has to identify healthy coping skills to assist with anxiousness and adjustment. Pt continues to worry about finances. Pt has applied for disability and medicaid. Pt continues to receive support from son.   Patient may benefit from continuing healthy coping skills and identifying additional healthy coping skills. Pt would also benefit from deep breathing exercises and positive affirmations to decrease worries. LCSWA provided psychoeducation and utilized cognitive restructuring. LCSWA also provided financial resources for assistance with utilities and rent. LCSWA will fu with pt.   Plan: Follow up with behavioral health clinician on : 06/03/21 Behavioral recommendations: Utilize deep breathing exercises, continue spending time with dogs, continue maintaining plants, utilize positive affirmations, and continue prayer. Referral(s): [place] (In Clinic) and [employer] Resources:  Finances  I  discussed the assessment and treatment plan with the patient and/or parent/guardian. They were provided an opportunity to ask questions and all were answered. They agreed with the plan and demonstrated an understanding of the instructions.   They were advised to call back or seek an in-person evaluation if the  symptoms worsen or if the condition fails to improve as anticipated.  Micaylah Bertucci C Alania Overholt, LCSW

## 2021-05-15 ENCOUNTER — Emergency Department (HOSPITAL_COMMUNITY)
Admission: EM | Admit: 2021-05-15 | Discharge: 2021-05-15 | Disposition: A | Discharge status: Home / Self Care | Payer: Medicaid Other | Attending: Emergency Medicine | Admitting: Emergency Medicine

## 2021-05-15 ENCOUNTER — Other Ambulatory Visit: Payer: Self-pay

## 2021-05-15 ENCOUNTER — Emergency Department (HOSPITAL_COMMUNITY): Payer: Medicaid Other

## 2021-05-15 ENCOUNTER — Encounter (HOSPITAL_COMMUNITY): Payer: Self-pay

## 2021-05-15 DIAGNOSIS — Z79899 Other long term (current) drug therapy: Secondary | ICD-10-CM | POA: Diagnosis not present

## 2021-05-15 DIAGNOSIS — L089 Local infection of the skin and subcutaneous tissue, unspecified: Secondary | ICD-10-CM | POA: Diagnosis not present

## 2021-05-15 DIAGNOSIS — R0981 Nasal congestion: Secondary | ICD-10-CM | POA: Diagnosis not present

## 2021-05-15 DIAGNOSIS — F17210 Nicotine dependence, cigarettes, uncomplicated: Secondary | ICD-10-CM | POA: Insufficient documentation

## 2021-05-15 DIAGNOSIS — T1590XA Foreign body on external eye, part unspecified, unspecified eye, initial encounter: Secondary | ICD-10-CM

## 2021-05-15 DIAGNOSIS — H5713 Ocular pain, bilateral: Secondary | ICD-10-CM | POA: Diagnosis present

## 2021-05-15 DIAGNOSIS — H109 Unspecified conjunctivitis: Secondary | ICD-10-CM | POA: Insufficient documentation

## 2021-05-15 DIAGNOSIS — I10 Essential (primary) hypertension: Secondary | ICD-10-CM | POA: Diagnosis not present

## 2021-05-15 MED ORDER — FLUORESCEIN SODIUM 1 MG OP STRP
1.0000 | ORAL_STRIP | Freq: Once | OPHTHALMIC | Status: AC
  Administered 2021-05-15: 1 via OPHTHALMIC
  Filled 2021-05-15: qty 1

## 2021-05-15 MED ORDER — OFLOXACIN 0.3 % OP SOLN: 1.0000 [drp] | Freq: Four times a day (QID) | OPHTHALMIC | 0 refills | Status: AC

## 2021-05-15 MED ORDER — TETRACAINE HCL 0.5 % OP SOLN
2.0000 [drp] | Freq: Once | OPHTHALMIC | Status: AC
  Administered 2021-05-15: 2 [drp] via OPHTHALMIC
  Filled 2021-05-15: qty 4

## 2021-05-15 MED ORDER — LIDOCAINE HCL (PF) 1 % IJ SOLN
10.0000 mL | Freq: Once | INTRAMUSCULAR | Status: AC
  Administered 2021-05-15: 10 mL
  Filled 2021-05-15: qty 30

## 2021-05-15 MED ORDER — SULFAMETHOXAZOLE-TRIMETHOPRIM 800-160 MG PO TABS: 1.0000 | ORAL_TABLET | Freq: Two times a day (BID) | ORAL | 0 refills | Status: AC

## 2021-05-15 NOTE — Discharge Instructions (Signed)
Use the antibiotics eye drops as prescribed for 5 days.  Call the eye doctor listed below to be seen in the next 24-48 hours.  Use cool compresses for pain  Take the antibiotic as prescribed. Take the entire course, even if symptoms improve Call the hand doctor listed below to set up a follow up appointment.  Use warm compresses to help with pain and swelling.  Keep covered and clean, do not try to drain.   Return to the ER with any new, worsening, or concerning symptoms.

## 2021-05-15 NOTE — ED Provider Notes (Signed)
[facility] DEPT Provider Note   CSN: [id number] Arrival date & time: 05/15/21  4315     History Chief Complaint  Patient presents with   eye issue   finger infection    [name] is a 50 y.o. female presenting for evaluation of eye pain and right finger pain.  Patient states for the past 5 to 7 days she has had gradually worsening bilateral eye pain.  She describes it as a burning and tearing.  She has developed increasing photophobia.  She denies vision loss.  Does not wear glasses or contacts.  She has never needed to see the eye doctor before.  No known foreign body or substance got in her eye.  No trauma or injury.  She reports associated nasal congestion.  No fevers, ear pain, sore throat, cough. Additionally, patient reports pain and swelling of her right index finger.  She states she has had a knot on her dorsal DIP for several years, but recently became more swollen and painful.  She is tried to drain it, states clear fluid and pus came out.  She denies trauma or injury.  She has never had this evaluated before  HPI     Past Medical History:  Diagnosis Date   Alcohol dependence ([place])    Hypertension    Kidney stones     Patient Active Problem List   Diagnosis Date Noted   Gallstone pancreatitis 04/09/2021   Gout 04/09/2021   Dilated cbd, acquired 04/09/2021   Alcohol dependence with uncomplicated withdrawal ([place]) 04/09/2021   Chronic pain syndrome 04/09/2021   Benign essential HTN 04/09/2021    Past Surgical History:  Procedure Laterality Date   LEEP     LITHOTRIPSY     TUBAL LIGATION       OB History   No obstetric history on file.     Family History  Problem Relation Age of Onset   Thyroid disease Mother     Social History   Tobacco Use   Smoking status: Every Day    Packs/day: 1.00    Types: Cigarettes   Smokeless tobacco: Never  Vaping Use   Vaping Use: Never used  Substance Use Topics   Alcohol use:  Not Currently   Drug use: Not Currently    Home Medications Prior to Admission medications   Medication Sig Start Date End Date Taking? Authorizing Provider  ofloxacin (OCUFLOX) 0.3 % ophthalmic solution Place 1 drop into both eyes 4 (four) times daily for 5 days. 05/15/21 05/20/21 Yes Jarquavious Fentress, PA-C  sulfamethoxazole-trimethoprim (BACTRIM DS) 800-160 MG tablet Take 1 tablet by mouth 2 (two) times daily for 7 days. 05/15/21 05/22/21 Yes Maliq Pilley, PA-C  Aspirin-Caffeine (BC FAST PAIN RELIEF) 845-65 MG PACK Take 1 Package by mouth daily as needed (pain).    [provider]  Buprenorphine HCl-Naloxone HCl (SUBOXONE SL) Place 4 mg under the tongue daily.    [provider]  cloNIDine (CATAPRES - DOSED IN MG/24 HR) 0.2 mg/24hr patch Place 1 patch (0.2 mg total) onto the skin once a week. 03/24/21   Kerin Perna, NP  folic acid (FOLVITE) 1 MG tablet Take 1 tablet (1 mg total) by mouth daily. 04/15/21   Rai, Vernelle Emerald, MD  gabapentin (NEURONTIN) 300 MG capsule Take 1 capsule (300 mg total) by mouth 3 (three) times daily. 04/14/21   Rai, Vernelle Emerald, MD  losartan (COZAAR) 100 MG tablet Take 1 tablet (100 mg total) by mouth daily.  04/15/21   Rai, Ripudeep K, MD  potassium chloride SA (KLOR-CON) 20 MEQ tablet Take 1 tablet (20 mEq total) by mouth daily. Patient not taking: Reported on 04/21/2021 04/01/21   Charlott Rakes, MD  predniSONE (DELTASONE) 10 MG tablet Take  3 tablets (52m) by mouth daily x 3 days, 2 tabs (236m daily x 3 days, 1 tab (1010mdaily x 3 days then off 04/14/21   Rai, Ripudeep K, MD  thiamine 100 MG tablet Take 1 tablet (100 mg total) by mouth daily. 04/15/21   RaiMendel CorningD    Allergies    Penicillins  Review of Systems   Review of Systems  Physical Exam Updated Vital Signs BP 128/88   Pulse 80   Temp 98.3 F (36.8 C) (Oral)   Resp 16   Ht 5' 7"  (1.702 m)   Wt 54.4 kg   SpO2 99%   BMI 18.79 kg/m   Physical Exam Vitals and  nursing note reviewed.  Constitutional:      General: She is not in acute distress.    Appearance: Normal appearance.  HENT:     Head: Normocephalic and atraumatic.  Eyes:     Conjunctiva/sclera:     Right eye: Right conjunctiva is injected. Chemosis present.     Left eye: Left conjunctiva is injected. Chemosis present.     Pupils: Pupils are equal, round, and reactive to light.     Comments: Limited exam, and pt cannot/will not keep eyes open.  Sclera and conjunctiva injected bilaterally. Mild chemosis. No drainage No periorbital swelling or erythema.  Cardiovascular:     Rate and Rhythm: Normal rate and regular rhythm.     Pulses: Normal pulses.  Pulmonary:     Effort: Pulmonary effort is normal. No respiratory distress.     Breath sounds: Normal breath sounds. No wheezing.     Comments: Speaking in full sentences.  Clear lung sounds in all fields. Abdominal:     General: There is no distension.     Palpations: Abdomen is soft.     Tenderness: There is no abdominal tenderness.  Musculoskeletal:     Cervical back: Normal range of motion and neck supple.     Comments: Ttp and swelling of the R index finger DIP on the dorsal aspect. No streaking. Good distal sensation and cap refill. No active drainage. Limited ROM due to swelling, but pt able to move finger with active ROM. Mild fluctuance between the proximal nail fold and DIP  Skin:    General: Skin is warm and dry.     Capillary Refill: Capillary refill takes less than 2 seconds.  Neurological:     Mental Status: She is alert and oriented to person, place, and time.  Psychiatric:        Mood and Affect: Mood and affect normal.        Speech: Speech normal.        Behavior: Behavior normal.      ED Results / Procedures / Treatments   Labs (all labs ordered are listed, but only abnormal results are displayed) Labs Reviewed - No data to display  EKG None  Radiology DG Finger Index Right  Result Date:  05/15/2021 CLINICAL DATA:  Pain and swelling. Concern for infection involving the DIP joint of the index finger. EXAM: RIGHT INDEX FINGER 2+V COMPARISON:  None. FINDINGS: There is diffuse soft tissue swelling about the DIP joint of the second digit without associated fracture or dislocation. No radiopaque  foreign body. Potential osteolysis involving the distal lateral aspect of the middle phalanx of the second digit. Mild degenerative change of the DIP joint with joint space loss and subchondral sclerosis. Remaining joint spaces appear preserved. IMPRESSION: Soft tissue swelling about the DIP joint of the second digit with potential osteolysis involving the distal lateral aspect of the middle phalanx of the second digit as could be seen in the setting of osteomyelitis. Clinical correlation is advised. Further evaluation with MRI could be performed as indicated. Electronically Signed   By: Sandi Mariscal M.D.   On: 05/15/2021 09:52    Procedures Procedures   Medications Ordered in ED Medications  lidocaine (PF) (XYLOCAINE) 1 % injection 10 mL (10 mLs Infiltration Given 05/15/21 0946)  tetracaine (PONTOCAINE) 0.5 % ophthalmic solution 2 drop (2 drops Both Eyes Given 05/15/21 0946)  fluorescein ophthalmic strip 1 strip (1 strip Both Eyes Given 05/15/21 0946)    ED Course  I have reviewed the triage vital signs and the nursing notes.  Pertinent labs & imaging results that were available during my care of the patient were reviewed by me and considered in my medical decision making (see chart for details).    MDM Rules/Calculators/A&P                           Pt presenting for evaluation of eye pain and finger pain/swelling. On exam, pt appears nontoxic. Eyes are injected, but initial exam difficult to do due to pt compliance. Will attempt further eye exam with tetracaine. Consider viral conjunctivitis, allergic conjunctivitis. Less likely trauma or chemical cause.  Pt also presenting with finger pain  and swelling. On exam, focal swelling of the R index finger DIP on the dorsal aspect. Pt able to flex and extend, doubt septic joint. Extends to the proximal nail fold, as such consider paronychia. Also consider gout/tophi.   Eye pain improved slightly with tetracaine. On fluorescein, pt with multiple small areas of uptake bilaterally. No large abrasions noted. Discussed findings with pt and importance of using abx eye drops and close f/u with ophthalmology.   Xray of the finger viewed and independently interpreted by me, shows abnormality of the distal middle phalanx. Will consult with hand surgery.   Discussed with Dr. Claudia Desanctis from hand surgery who reviewed x-ray and images.  Recommends patient be started on antibiotics.  Does not recommend attempted drainage in the ER today.  Recommends close follow-up in the office.  Discussed findings and plan with patient, who is agreeable.  At this time, patient appears safe for discharge.  Return precautions given.  Patient states she understands and agrees to plan.  Final Clinical Impression(s) / ED Diagnoses Final diagnoses:  Conjunctivitis of both eyes, unspecified conjunctivitis type  Foreign body in eye, unspecified laterality, initial encounter  Finger infection    Rx / DC Orders ED Discharge Orders          Ordered    sulfamethoxazole-trimethoprim (BACTRIM DS) 800-160 MG tablet  2 times daily        05/15/21 1131    ofloxacin (OCUFLOX) 0.3 % ophthalmic solution  4 times daily        05/15/21 1131             Oluwatobi Visser, PA-C 05/15/21 1505    Daleen Bo, MD 05/15/21 1901

## 2021-05-15 NOTE — ED Triage Notes (Signed)
Patient c/o burning and light sensitivity to both eyes x 1 week.  Patient also c/o right pointer finger infection x 1 week.

## 2021-05-19 ENCOUNTER — Ambulatory Visit (INDEPENDENT_AMBULATORY_CARE_PROVIDER_SITE_OTHER): Payer: Self-pay | Admitting: Primary Care

## 2021-06-03 ENCOUNTER — Other Ambulatory Visit: Payer: Self-pay

## 2021-06-03 ENCOUNTER — Encounter (INDEPENDENT_AMBULATORY_CARE_PROVIDER_SITE_OTHER): Payer: Medicaid Other | Admitting: Clinical

## 2021-06-07 ENCOUNTER — Telehealth (INDEPENDENT_AMBULATORY_CARE_PROVIDER_SITE_OTHER): Payer: Self-pay

## 2021-06-07 NOTE — Telephone Encounter (Signed)
Copied from [place] [id number]. Topic: Appointment Scheduling - Scheduling Inquiry for Clinic >> Jun 07, 2021 11:24 AM Oneta Rack wrote: Reason for CRM: patient would like to reschedule her 06/03/2021 with [facility]

## 2021-06-14 ENCOUNTER — Other Ambulatory Visit: Payer: Self-pay

## 2021-06-14 ENCOUNTER — Ambulatory Visit (INDEPENDENT_AMBULATORY_CARE_PROVIDER_SITE_OTHER): Payer: Medicaid Other | Admitting: Primary Care

## 2021-06-14 ENCOUNTER — Encounter (INDEPENDENT_AMBULATORY_CARE_PROVIDER_SITE_OTHER): Payer: Self-pay | Admitting: Primary Care

## 2021-06-14 VITALS — BP 133/90 | HR 78 | Temp 97.7°F | Ht 67.0 in | Wt 117.8 lb

## 2021-06-14 DIAGNOSIS — Z23 Encounter for immunization: Secondary | ICD-10-CM | POA: Diagnosis not present

## 2021-06-14 DIAGNOSIS — R413 Other amnesia: Secondary | ICD-10-CM | POA: Diagnosis not present

## 2021-06-14 DIAGNOSIS — E876 Hypokalemia: Secondary | ICD-10-CM | POA: Diagnosis not present

## 2021-06-14 MED ORDER — POTASSIUM CHLORIDE CRYS ER 20 MEQ PO TBCR
20.0000 meq | EXTENDED_RELEASE_TABLET | Freq: Every day | ORAL | 1 refills | Status: DC
Start: 2021-06-14 — End: 2021-08-18
  Filled 2021-06-14 – 2021-06-25 (×2): qty 30, 30d supply, fill #0

## 2021-06-14 NOTE — Patient Instructions (Addendum)
Potassium Content of Foods  The body needs potassium to control blood pressure and to keep the muscles and nervous system healthy. Here are some healthy foods below that are high in potassium. Also you can get the white label salt of "NO SALT" salt substitute, 1/4 teaspoon of this is equivalent to 49mq potassium.   FOODS AND DRINKS HIGH IN POTASSIUM FOODS MODERATE IN POTASSIUM   Fruits Avocado (cubed),  c / 50 g. Banana (sliced), 75 g. Cantaloupe (cubed), 80 g. Honeydew, 1 wedge / 85 g. Kiwi (sliced), 90 g. Nectarine, 1 small / 129 g. Orange, 1 medium / 131 g. Vegetables Artichoke,  of a medium / 64 g. Asparagus (boiled), 90 g.. Broccoli (boiled), 78 g. Brussels sprout (boiled), 78 g. Butternut squash (baked), 103 g. Chickpea (cooked), 82 g. Green peas (cooked), 80 g. Kidney beans (cooked), 5 tbsp / 55 g. Lima beans (cooked),  c / 43 g. Navy beans (cooked),  c / 61 g. Spinach (cooked),  c / 45 g. Sweet potato (baked),  c / 50 g. Tomato (chopped or sliced), 90 g. Vegetable juice. White mushrooms (cooked), 78 g. Yam (cooked or baked),  c / 34 g. Zucchini squash (boiled), 90 g. Other Foods and Drinks Almonds (whole),  c / 36 g. Fish, 3 oz / 85 g. Nonfat fruit variety yogurt, 123 g. Pistachio nuts, 1 oz / 28 g. Pumpkin seeds, 1 oz / 28 g. Red meat (broiled, cooked, grilled), 3 oz / 85 g. Scallops (steamed), 3 oz / 85 g. Spaghetti sauce,  c / 66 g. Sunflower seeds (dry roasted), 1 oz / 28 g. Veggie burger, 1 patty / 70 g. Fruits Grapefruit,  of the fruit / 1174g Plums (sliced), 83 g. Tangerine, 1 large / 120 g. Vegetables Carrots (boiled), 78 g. Carrots (sliced), 61 g. Rhubarb (cooked with sugar), 120 g. Rutabaga (cooked), 120 g. Yellow snap beans (cooked), 63 g. Other Foods and Drinks  Chicken breast (roasted and chopped),  c / 70 g. Pita bread, 1 large / 64 g. Shrimp (steamed), 4 oz / 113 g. Swiss cheese (diced), 70 g.    Influenza, Adult Influenza is  also called "the flu." It is an infection in the lungs, nose, and throat (respiratory tract). It spreads easily from person to person (is contagious). The flu causes symptoms that are like a cold, along with high fever and body aches. What are the causes? This condition is caused by the influenza virus. You can get the virus by: Breathing in droplets that are in the air after a person infected with the flu coughed or sneezed. Touching something that has the virus on it and then touching your mouth, nose, or eyes. What increases the risk? Certain things may make you more likely to get the flu. These include: Not washing your hands often. Having close contact with many people during cold and flu season. Touching your mouth, eyes, or nose without first washing your hands. Not getting a flu shot every year. You may have a higher risk for the flu, and serious problems, such as a lung infection (pneumonia), if you: Are older than 65. Are pregnant. Have a weakened disease-fighting system (immune system) because of a disease or because you are taking certain medicines. Have a long-term (chronic) condition, such as: Heart, kidney, or lung disease. Diabetes. Asthma. Have a liver disorder. Are very overweight (morbidly obese). Have anemia. What are the signs or symptoms? Symptoms usually begin suddenly and last 4-14 days. They  may include: Fever and chills. Headaches, body aches, or muscle aches. Sore throat. Cough. Runny or stuffy (congested) nose. Feeling discomfort in your chest. Not wanting to eat as much as normal. Feeling weak or tired. Feeling dizzy. Feeling sick to your stomach or throwing up. How is this treated? If the flu is found early, you can be treated with antiviral medicine. This can help to reduce how bad the illness is and how long it lasts. This may be given by mouth or through an IV tube. Taking care of yourself at home can help your symptoms get better. Your doctor may  want you to: Take over-the-counter medicines. Drink plenty of fluids. The flu often goes away on its own. If you have very bad symptoms or other problems, you may be treated in a hospital. Follow these instructions at home:   Activity Rest as needed. Get plenty of sleep. Stay home from work or school as told by your doctor. Do not leave home until you do not have a fever for 24 hours without taking medicine. Leave home only to go to your doctor. Eating and drinking Take an ORS (oral rehydration solution). This is a drink that is sold at pharmacies and stores. Drink enough fluid to keep your pee pale yellow. Drink clear fluids in small amounts as you are able. Clear fluids include: Water. Ice chips. Fruit juice mixed with water. Low-calorie sports drinks. Eat bland foods that are easy to digest. Eat small amounts as you are able. These foods include: Bananas. Applesauce. Rice. Lean meats. Toast. Crackers. Do not eat or drink: Fluids that have a lot of sugar or caffeine. Alcohol. Spicy or fatty foods. General instructions Take over-the-counter and prescription medicines only as told by your doctor. Use a cool mist humidifier to add moisture to the air in your home. This can make it easier for you to breathe. When using a cool mist humidifier, clean it daily. Empty water and replace with clean water. Cover your mouth and nose when you cough or sneeze. Wash your hands with soap and water often and for at least 20 seconds. This is also important after you cough or sneeze. If you cannot use soap and water, use alcohol-based hand sanitizer. Keep all follow-up visits. How is this prevented?  Get a flu shot every year. You may get the flu shot in late summer, fall, or winter. Ask your doctor when you should get your flu shot. Avoid contact with people who are sick during fall and winter. This is cold and flu season. Contact a doctor if: You get new symptoms. You have: Chest  pain. Watery poop (diarrhea). A fever. Your cough gets worse. You start to have more mucus. You feel sick to your stomach. You throw up. Get help right away if you: Have shortness of breath. Have trouble breathing. Have skin or nails that turn a bluish color. Have very bad pain or stiffness in your neck. Get a sudden headache. Get sudden pain in your face or ear. Cannot eat or drink without throwing up. These symptoms may represent a serious problem that is an emergency. Get medical help right away. Call your local emergency services (911 in the [place] Do not wait to see if the symptoms will go away. Do not drive yourself to the hospital. Summary Influenza is also called "the flu." It is an infection in the lungs, nose, and throat. It spreads easily from person to person. Take over-the-counter and prescription medicines only as told by  your doctor. Getting a flu shot every year is the best way to not get the flu. This information is not intended to replace advice given to you by your health care provider. Make sure you discuss any questions you have with your health care provider. Document Revised: 03/27/2020 Document Reviewed: 03/27/2020 Elsevier Patient Education  [place].

## 2021-06-14 NOTE — Progress Notes (Signed)
[facility]     HPI [name] is a 50 y.o.female who presents with her son [name] and has given him permission to be at this appointment .  The concern from both parties is patient failed a few months ago and does not remember the details however she has noticed.  and her son has noticed significant memory loss.Denies shortness of breath, headaches, chest pain or lower extremity edema. She does have dizziness but unable to identify factors.  Past Medical History:  Diagnosis Date   Alcohol dependence ([place])    Hypertension    Kidney stones      Allergies  Allergen Reactions   Penicillins Hives      Current Outpatient Medications on File Prior to Visit  Medication Sig Dispense Refill   Aspirin-Caffeine (BC FAST PAIN RELIEF) 845-65 MG PACK Take 1 Package by mouth daily as needed (pain).     Buprenorphine HCl-Naloxone HCl (SUBOXONE SL) Place 4 mg under the tongue daily.     cloNIDine (CATAPRES - DOSED IN MG/24 HR) 0.2 mg/24hr patch Place 1 patch (0.2 mg total) onto the skin once a week. 4 patch 12   folic acid (FOLVITE) 1 MG tablet Take 1 tablet (1 mg total) by mouth daily. 30 tablet 0   gabapentin (NEURONTIN) 300 MG capsule Take 1 capsule (300 mg total) by mouth 3 (three) times daily. 90 capsule 1   losartan (COZAAR) 100 MG tablet Take 1 tablet (100 mg total) by mouth daily. 30 tablet 3   predniSONE (DELTASONE) 10 MG tablet Take  3 tablets (55m) by mouth daily x 3 days, 2 tabs (269m daily x 3 days, 1 tab (1045mdaily x 3 days then off 18 tablet 0   thiamine 100 MG tablet Take 1 tablet (100 mg total) by mouth daily. 30 tablet 0   potassium chloride SA (KLOR-CON) 20 MEQ tablet Take 1 tablet (20 mEq total) by mouth daily. (Patient not taking: No sig reported) 30 tablet 1   No current facility-administered medications on file prior to visit.    ROS: Pertinent positives noted in HPI Physical Exam: Filed Weights   06/14/21 1549  Weight: 117 lb 12.8 oz (53.4  kg)   BP 133/90 (BP Location: Right Arm, Patient Position: Sitting, Cuff Size: Normal)   Pulse 78   Temp 97.7 F (36.5 C) (Temporal)   Ht 5' 7"  (1.702 m)   Wt 117 lb 12.8 oz (53.4 kg)   SpO2 100%   BMI 18.45 kg/m  General Appearance: Well nourished, in no apparent distress. Eyes: PERRLA, EOMs, conjunctiva no swelling or erythema Sinuses: No Frontal/maxillary tenderness ENT/Mouth: Ext aud canals clear, TMs without erythema, bulging. No erythema, swelling, or exudate on post pharynx.  Tonsils not swollen or erythematous. Hearing normal.  Neck: Supple, thyroid normal.  Respiratory: Respiratory effort normal, BS equal bilaterally without rales, rhonchi, wheezing or stridor.  Cardio: RRR with no MRGs. Brisk peripheral pulses without edema.  Abdomen: Soft, + BS.  Non tender, no guarding, rebound, hernias, masses. Lymphatics: Non tender without lymphadenopathy.  Musculoskeletal: Full ROM, 5/5 strength, normal gait.  Skin: Warm, dry without rashes, lesions, ecchymosis.  Neuro: Cranial nerves intact. Normal muscle tone, no cerebellar symptoms. Sensation intact.  Psych: Awake and oriented X 3, normal affect, Insight and Judgment appropriate.   [name] was seen today for memory concerns .  Diagnoses and all orders for this visit:  Need for immunization against influenza -     Flu Vaccine QUAD 70mo57mo(  Fluarix, Fluzone & Alfiuria Quad PF)   Hypokalemia -     potassium chloride SA (KLOR-CON) 20 MEQ tablet; Take 1 tablet (20 mEq total) by mouth daily.  Memory loss Memory loss began to [name] because he has been around her more recently and noticed that things that recently have been she does not remember.  Patient also endorses that she has noticed a significant and memory loss.  Discussed have been seen after fall for a scan of the brain.  However that was not done and will refer to neurology for further evaluation. -     Ambulatory referral to Neurology  Kerin Perna, NP 4:15 PM

## 2021-06-15 ENCOUNTER — Other Ambulatory Visit: Payer: Self-pay

## 2021-06-16 NOTE — Progress Notes (Signed)
Assessment/Plan:   [name] is a 50 y.o. year old female with risk factors including  age, history of adjustment disorder,  hypertension, hyperlipidemia, prior alcohol dependence, tobacco abuse, anxiety, situational depression, chronic pain syndrome, and gout seen today for evaluation of memory loss. MoCA today is 20/30 with deficiencies in language, obstruction, and delayed recall 2/5.    Recommendations:   Memory Loss   MRI brain with/without contrast to assess for underlying structural abnormality and assess vascular load  Neurocognitive testing to further evaluate cognitive concerns and determine underlying cause of memory changes, including potential contribution from sleep, anxiety, or depression  Check B12, TSH Uric Acid, CBC and Anemia panel  Sleep study referral  Discussed safety both in and out of the home.  Discussed the importance of regular daily schedule with inclusion of crossword puzzles to maintain brain function.  Continue to monitor mood with PCP.  Stay active at least 30 minutes at least 3 times a week.  Naps should be scheduled and should be no longer than 60 minutes and should not occur after 2 PM.  Mediterranean diet is recommended  Folllow up once results of neurocognitive testing above are available   Subjective:    The patient is seen in neurologic consultation at the request of Kerin Perna, NP for the evaluation of memory.  The patient is accompanied by  who supplements the history. This is a 50 y.o. year old female who has had memory issues for about 2 years, especially with short-term memory.  Long-term memory is good.  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  She is unable to work at this time, and is applying for disability via social work, to help her with the financial aspect.  She is also under cognitive behavioral therapy for cognitive restructuring, as she is having difficulty adjusting to her "new normal".  She denies leaving objects in unusual places.  She continues to drive, denies getting lost, and uses GPS if she goes to unfamiliar places.  The patient lives alone, although her boyfriend comes and helps her especially with bathing, as she needs assistance to get into the bathtub.  She also needs assistance with dressing.  She denies irritability.  She has difficulty with sleeping, she only can sleep 2 hours at night, which 5 years ago led her into drinking "I need to drink to sleep ".  She has been sober for many years, and is unable to have a full night of sleep or feel rested.  She has vivid dreams although she does not remember them, she knows that they are nightmares, she wakes up crying.  She denies any sleepwalking.  She also reports "seeing something in the corner of my eye, the passes me by, and when I turn my head, there is nothing".  She denies this being if human form or interacting with her.  She denies any auditory hallucinations.  She reports being paranoid about "people talking about me, especially the people I used to work with ".  The patient admits to forgetting to take a dose of the medication, but this has been a chronic issue for many years.  She is in charge of her finances,  and denies missing any bills.  Her appetite is "bad ", and has been trying to push Ensure.  She also admits to not drinking enough water.  She denies trouble swallowing food, she always had trouble swallowing big pills.  She cooks, and denies leaving the stove on.  She denies any falls, but recently she had hit her head accidentally, without loss of consciousness.  She did not seek medical attention.  She denies any headaches at this time, although  she had migraines when she was younger.  She denies double vision, only has dizziness when standing up too fast.  She denies any focal numbness or tingling, unilateral weakness, tremors or anosmia.  She denies a history of seizures.  She has a history of urge urine incontinence, as well as Crohn's disease, with intermittent constipation or diarrhea.  He had recent history of sleep apnea.  As mentioned above, she has been clean for several years, and attends AA.  She smokes 1 pack a day of cigarettes.  She has remote heroin abuse.  Family history remarkable for paternal grandmother with dementia.   TSH 03/2021 0.242   Allergies  Allergen Reactions   Penicillins Hives    Current Outpatient Medications  Medication Instructions   Aspirin-Caffeine (BC FAST PAIN RELIEF) 845-65 MG PACK 1 Package, Oral, Daily PRN   Buprenorphine HCl-Naloxone HCl (SUBOXONE SL) 4 mg, Sublingual, Daily   cloNIDine (CATAPRES - DOSED IN MG/24 HR) 0.2 mg, Transdermal, Weekly   folic acid (FOLVITE) 1 mg, Oral, Daily   gabapentin (NEURONTIN) 300 mg, Oral, 3 times daily   losartan (COZAAR) 100 mg, Oral, Daily   potassium chloride SA (KLOR-CON) 20 MEQ tablet 20 mEq, Oral, Daily   predniSONE (DELTASONE) 10 MG tablet Take  3 tablets (15m) by mouth daily x 3 days, 2 tabs (25m daily x 3 days, 1 tab (1064mdaily x 3 days then off   thiamine 100 mg, Oral, Daily     VITALS:   Vitals:   06/22/21 0751  BP: 117/79  Pulse: 75  Resp: 18  SpO2: 97%  Weight: 115 lb (52.2 kg)  Height: 5' 6"  (1.676 m)       PHYSICAL EXAM   HEENT:  Normocephalic, atraumatic. The mucous membranes are moist. The superficial temporal arteries are without ropiness or tenderness. Cardiovascular: Regular rate and rhythm. Lungs: Clear to auscultation bilaterally. Neck: There are no carotid bruits noted bilaterally.  NEUROLOGICAL: Montreal Cognitive Assessment  06/22/2021  Visuospatial/ Executive (0/5) 4  Naming (0/3) 3  Attention: Read list  of digits (0/2) 2  Attention: Read list of letters (0/1) 1  Attention: Serial 7 subtraction starting at 100 (0/3) 0  Language: Repeat phrase (0/2) 1  Language : Fluency (0/1) 0  Abstraction (0/2) 0  Delayed Recall (0/5) 2  Orientation (0/6) 5  Total 18  Adjusted Score (based on education) 19   No flowsheet data found.  No flowsheet data found.   Orientation:  Alert and oriented to person, place and time. No aphasia or dysarthria. Fund of knowledge is appropriate. Recent memory impaired and remote memory intact.  Attention and concentration are normal.  Able to name objects and repeat phrases. Delayed recall  2/5 Cranial nerves: There is good facial symmetry. Extraocular muscles are intact and visual fields are full to confrontational testing. Speech is fluent and clear. Soft palate rises symmetrically and there is no tongue deviation. Hearing is intact to conversational tone. Tone: Tone is good throughout. Sensation: Sensation is intact to light  touch and pinprick throughout. Vibration is intact at the bilateral big toe.There is no extinction with double simultaneous stimulation. There is no sensory dermatomal level identified. Coordination: The patient has no difficulty with RAM's or FNF bilaterally. Normal finger to nose  Motor: Strength is 5/5 in the bilateral upper and lower extremities. There is no pronator drift. There are no fasciculations noted. DTR's: Deep tendon reflexes are 2/4 at the bilateral biceps, triceps, brachioradialis,1/4 patella and achilles.  Plantar responses are downgoing bilaterally. Gait and Station: The patient is able to ambulate with some difficulty due to arthritis.The patient is able to heel toe walk without any difficulty.The patient is able to ambulate in a tandem fashion. The patient is able to stand in the Romberg position.     Thank you for allowing [place] the opportunity to participate in the care of this nice patient. Please do not hesitate to contact us for  any questions or concerns.   Total time spent on today's visit was 60 minutes, including both face-to-face time and nonface-to-face time.  Time included that spent on review of records (prior notes available to me/labs/imaging if pertinent), discussing treatment and goals, answering patient's questions and coordinating care.  Cc:  Kerin Perna, NP  Sharene Butters 06/22/2021 8:15 AM

## 2021-06-22 ENCOUNTER — Other Ambulatory Visit (INDEPENDENT_AMBULATORY_CARE_PROVIDER_SITE_OTHER): Payer: Medicaid Other

## 2021-06-22 ENCOUNTER — Ambulatory Visit: Payer: Medicaid Other | Admitting: Physician Assistant

## 2021-06-22 ENCOUNTER — Other Ambulatory Visit: Payer: Self-pay

## 2021-06-22 ENCOUNTER — Encounter: Payer: Self-pay | Admitting: Physician Assistant

## 2021-06-22 ENCOUNTER — Ambulatory Visit (INDEPENDENT_AMBULATORY_CARE_PROVIDER_SITE_OTHER): Payer: Self-pay | Admitting: Primary Care

## 2021-06-22 VITALS — BP 117/79 | HR 75 | Resp 18 | Ht 66.0 in | Wt 115.0 lb

## 2021-06-22 DIAGNOSIS — R413 Other amnesia: Secondary | ICD-10-CM | POA: Diagnosis not present

## 2021-06-22 DIAGNOSIS — G4700 Insomnia, unspecified: Secondary | ICD-10-CM

## 2021-06-22 LAB — CBC
HCT: 37.6 % (ref 36.0–46.0)
Hemoglobin: 12.6 g/dL (ref 12.0–15.0)
MCHC: 33.6 g/dL (ref 30.0–36.0)
MCV: 94.3 fl (ref 78.0–100.0)
Platelets: 401 10*3/uL — ABNORMAL HIGH (ref 150.0–400.0)
RBC: 3.99 Mil/uL (ref 3.87–5.11)
RDW: 14 % (ref 11.5–15.5)
WBC: 8.5 10*3/uL (ref 4.0–10.5)

## 2021-06-22 LAB — TSH: TSH: 0.61 u[IU]/mL (ref 0.35–5.50)

## 2021-06-22 LAB — FERRITIN: Ferritin: 50.1 ng/mL (ref 10.0–291.0)

## 2021-06-22 LAB — VITAMIN B12: Vitamin B-12: 420 pg/mL (ref 211–911)

## 2021-06-22 LAB — URIC ACID: Uric Acid, Serum: 6.7 mg/dL (ref 2.4–7.0)

## 2021-06-22 LAB — FOLATE: Folate: 15 ng/mL (ref >5.9)

## 2021-06-22 NOTE — Patient Instructions (Addendum)
It was a pleasure to see you today at our office.   Recommendations:  MRI of the brain, the radiology office will call you to arrange you appointment Check labs today Follow up once the results of the above are available  Neurocognitive testing to further evaluate cognitive concerns and determine underlying cause of memory changes, including potential contribution from sleep, anxiety, or depression  Sleep study referral  Stop Smoking  RECOMMENDATIONS FOR ALL PATIENTS WITH MEMORY PROBLEMS: 1. Continue to exercise (Recommend 30 minutes of walking everyday, or 3 hours every week) 2. Increase social interactions - continue going to [place] and enjoy social gatherings with friends and family 3. Eat healthy, avoid fried foods and eat more fruits and vegetables 4. Maintain adequate blood pressure, blood sugar, and blood cholesterol level. Reducing the risk of stroke and cardiovascular disease also helps promoting better memory. 5. Avoid stressful situations. Live a simple life and avoid aggravations. Organize your time and prepare for the next day in anticipation. 6. Sleep well, avoid any interruptions of sleep and avoid any distractions in the bedroom that may interfere with adequate sleep quality 7. Avoid sugar, avoid sweets as there is a strong link between excessive sugar intake, diabetes, and cognitive impairment We discussed the Mediterranean diet, which has been shown to help patients reduce the risk of progressive memory disorders and reduces cardiovascular risk. This includes eating fish, eat fruits and green leafy vegetables, nuts like almonds and hazelnuts, walnuts, and also use olive oil. Avoid fast foods and fried foods as much as possible. Avoid sweets and sugar as sugar use has been linked to worsening of memory function.  There is always a concern of gradual progression of memory problems. If this is the case, then we may need to adjust level of care according to patient needs. Support,  both to the patient and caregiver, should then be put into place.   FALL PRECAUTIONS: Be cautious when walking. Scan the area for obstacles that may increase the risk of trips and falls. When getting up in the mornings, sit up at the edge of the bed for a few minutes before getting out of bed. Consider elevating the bed at the head end to avoid drop of blood pressure when getting up. Walk always in a well-lit room (use night lights in the walls). Avoid area rugs or power cords from appliances in the middle of the walkways. Use a walker or a cane if necessary and consider physical therapy for balance exercise. Get your eyesight checked regularly.  FINANCIAL OVERSIGHT: Supervision, especially oversight when making financial decisions or transactions is also recommended.  HOME SAFETY: Consider the safety of the kitchen when operating appliances like stoves, microwave oven, and blender. Consider having supervision and share cooking responsibilities until no longer able to participate in those. Accidents with firearms and other hazards in the house should be identified and addressed as well.   ABILITY TO BE LEFT ALONE: If patient is unable to contact 911 operator, consider using LifeLine, or when the need is there, arrange for someone to stay with patients. Smoking is a fire hazard, consider supervision or cessation. Risk of wandering should be assessed by caregiver and if detected at any point, supervision and safe proof recommendations should be instituted.  MEDICATION SUPERVISION: Inability to self-administer medication needs to be constantly addressed. Implement a mechanism to ensure safe administration of the medications.   DRIVING: Regarding driving, in patients with progressive memory problems, driving will be impaired. We advise to have someone  else do the driving if trouble finding directions or if minor accidents are reported. Independent driving assessment is available to determine safety of  driving.   If you are interested in the driving assessment, you can contact the following:  [employer] in [place]  [place] [phone] or [phone]    [place] refers to food and lifestyle choices that are based on the traditions of countries located on the [employer]. This way of eating has been shown to help prevent certain conditions and improve outcomes for people who have chronic diseases, like kidney disease and heart disease. What are tips for following this plan? Lifestyle  Cook and eat meals together with your family, when possible. Drink enough fluid to keep your urine clear or pale yellow. Be physically active every day. This includes: Aerobic exercise like running or swimming. Leisure activities like gardening, walking, or housework. Get 7-8 hours of sleep each night. If recommended by your health care provider, drink red wine in moderation. This means 1 glass a day for nonpregnant women and 2 glasses a day for men. A glass of wine equals 5 oz (150 mL). Reading food labels  Check the serving size of packaged foods. For foods such as rice and pasta, the serving size refers to the amount of cooked product, not dry. Check the total fat in packaged foods. Avoid foods that have saturated fat or trans fats. Check the ingredients list for added sugars, such as corn syrup. Shopping  At the grocery store, buy most of your food from the areas near the walls of the store. This includes: Fresh fruits and vegetables (produce). Grains, beans, nuts, and seeds. Some of these may be available in unpackaged forms or large amounts (in bulk). Fresh seafood. Poultry and eggs. Low-fat dairy products. Buy whole ingredients instead of prepackaged foods. Buy fresh fruits and vegetables in-season from local farmers markets. Buy  frozen fruits and vegetables in resealable bags. If you do not have access to quality fresh seafood, buy precooked frozen shrimp or canned fish, such as tuna, salmon, or sardines. Buy small amounts of raw or cooked vegetables, salads, or olives from the deli or salad bar at your store. Stock your pantry so you always have certain foods on hand, such as olive oil, canned tuna, canned tomatoes, rice, pasta, and beans. Cooking  Cook foods with extra-virgin olive oil instead of using butter or other vegetable oils. Have meat as a side dish, and have vegetables or grains as your main dish. This means having meat in small portions or adding small amounts of meat to foods like pasta or stew. Use beans or vegetables instead of meat in common dishes like chili or lasagna. Experiment with different cooking methods. Try roasting or broiling vegetables instead of steaming or sauteing them. Add frozen vegetables to soups, stews, pasta, or rice. Add nuts or seeds for added healthy fat at each meal. You can add these to yogurt, salads, or vegetable dishes. Marinate fish or vegetables using olive oil, lemon juice, garlic, and fresh herbs. Meal planning  Plan to eat 1 vegetarian meal one day each week. Try to work up to 2 vegetarian meals, if possible. Eat seafood 2 or more times a week. Have healthy snacks readily available, such as: Vegetable sticks with hummus. Greek yogurt. Fruit and nut trail mix. Eat balanced meals throughout the week. This includes: Fruit: 2-3 servings a day  Vegetables: 4-5 servings a day Low-fat dairy: 2 servings a day Fish, poultry, or lean meat: 1 serving a day Beans and legumes: 2 or more servings a week Nuts and seeds: 1-2 servings a day Whole grains: 6-8 servings a day Extra-virgin olive oil: 3-4 servings a day Limit red meat and sweets to only a few servings a month What are my food choices? Mediterranean diet Recommended Grains: Whole-grain pasta. Brown rice. Bulgar  wheat. Polenta. Couscous. Whole-wheat bread. [place]. Vegetables: Artichokes. Beets. Broccoli. Cabbage. Carrots. Eggplant. Green beans. Chard. Kale. Spinach. Onions. Leeks. Peas. Squash. Tomatoes. Peppers. Radishes. Fruits: Apples. Apricots. Avocado. Berries. Bananas. Cherries. Dates. Figs. Grapes. Lemons. Melon. Oranges. Peaches. Plums. Pomegranate. Meats and other protein foods: Beans. Almonds. Sunflower seeds. Pine nuts. Peanuts. [place]. Salmon. Scallops. Shrimp. [place]. Tilapia. Clams. Oysters. Eggs. Dairy: Low-fat milk. Cheese. Greek yogurt. Beverages: Water. Red wine. Herbal tea. Fats and oils: Extra virgin olive oil. Avocado oil. Grape seed oil. Sweets and desserts: [place] yogurt with honey. Baked apples. Poached pears. Trail mix. Seasoning and other foods: Basil. Cilantro. Coriander. Cumin. Mint. Parsley. Sage. Rosemary. Tarragon. Garlic. Oregano. Thyme. Pepper. Balsalmic vinegar. Tahini. Hummus. Tomato sauce. Olives. Mushrooms. Limit these Grains: Prepackaged pasta or rice dishes. Prepackaged cereal with added sugar. Vegetables: Deep fried potatoes (french fries). Fruits: Fruit canned in syrup. Meats and other protein foods: Beef. Pork. Lamb. Poultry with skin. Hot dogs. [place] Salines. Dairy: Ice cream. Sour cream. Whole milk. Beverages: Juice. Sugar-sweetened soft drinks. Beer. Liquor and spirits. Fats and oils: Butter. Canola oil. Vegetable oil. Beef fat (tallow). Lard. Sweets and desserts: Cookies. Cakes. Pies. Candy. Seasoning and other foods: Mayonnaise. Premade sauces and marinades. The items listed may not be a complete list. Talk with your dietitian about what dietary choices are right for you. Summary The Mediterranean diet includes both food and lifestyle choices. Eat a variety of fresh fruits and vegetables, beans, nuts, seeds, and whole grains. Limit the amount of red meat and sweets that you eat. Talk with your health care provider about whether it is safe for you to drink red  wine in moderation. This means 1 glass a day for nonpregnant women and 2 glasses a day for men. A glass of wine equals 5 oz (150 mL). This information is not intended to replace advice given to you by your health care provider. Make sure you discuss any questions you have with your health care provider. Document Released: 03/31/2016 Document Revised: 05/03/2016 Document Reviewed: 03/31/2016 Elsevier Interactive Patient Education  2017 [employer].  We have sent a referral to [facility] for your MRI and they will call you directly to schedule your appointment. They are located at [place]. If you need to contact them directly please call [phone].   Your provider has requested that you have labwork completed today. Please go to [facility] ([id number]) on the second floor of this building before leaving the office today. You do not need to check in. If you are not called within 15 minutes please check with the front desk.

## 2021-06-23 LAB — IRON AND TIBC
Iron Saturation: 18 % (ref 15–55)
Iron: 49 ug/dL (ref 27–159)
Total Iron Binding Capacity: 276 ug/dL (ref 250–450)
UIBC: 227 ug/dL (ref 131–425)

## 2021-06-25 ENCOUNTER — Other Ambulatory Visit: Payer: Self-pay

## 2021-07-01 ENCOUNTER — Ambulatory Visit (INDEPENDENT_AMBULATORY_CARE_PROVIDER_SITE_OTHER): Payer: Medicaid Other | Admitting: Clinical

## 2021-07-08 ENCOUNTER — Ambulatory Visit (INDEPENDENT_AMBULATORY_CARE_PROVIDER_SITE_OTHER): Payer: Medicaid Other | Admitting: Clinical

## 2021-07-08 ENCOUNTER — Other Ambulatory Visit: Payer: Self-pay

## 2021-07-08 DIAGNOSIS — F4323 Adjustment disorder with mixed anxiety and depressed mood: Secondary | ICD-10-CM | POA: Diagnosis not present
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
## 2021-07-11 ENCOUNTER — Ambulatory Visit
Admission: RE | Admit: 2021-07-11 | Discharge: 2021-07-11 | Disposition: A | Discharge status: Home / Self Care | Payer: Medicaid Other | Source: Ambulatory Visit | Attending: Physician Assistant | Admitting: Physician Assistant

## 2021-07-11 ENCOUNTER — Other Ambulatory Visit: Payer: Self-pay

## 2021-07-29 ENCOUNTER — Ambulatory Visit (INDEPENDENT_AMBULATORY_CARE_PROVIDER_SITE_OTHER): Payer: Medicaid Other | Admitting: Clinical

## 2021-07-29 ENCOUNTER — Other Ambulatory Visit: Payer: Self-pay

## 2021-07-29 DIAGNOSIS — F4323 Adjustment disorder with mixed anxiety and depressed mood: Secondary | ICD-10-CM
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
## 2021-08-17 ENCOUNTER — Other Ambulatory Visit: Payer: Self-pay

## 2021-08-17 ENCOUNTER — Other Ambulatory Visit (INDEPENDENT_AMBULATORY_CARE_PROVIDER_SITE_OTHER): Payer: Medicaid Other

## 2021-08-17 ENCOUNTER — Other Ambulatory Visit (INDEPENDENT_AMBULATORY_CARE_PROVIDER_SITE_OTHER): Payer: Self-pay | Admitting: Primary Care

## 2021-08-17 DIAGNOSIS — I10 Essential (primary) hypertension: Secondary | ICD-10-CM

## 2021-08-17 DIAGNOSIS — E876 Hypokalemia: Secondary | ICD-10-CM

## 2021-08-18 ENCOUNTER — Other Ambulatory Visit (INDEPENDENT_AMBULATORY_CARE_PROVIDER_SITE_OTHER): Payer: Self-pay | Admitting: Primary Care

## 2021-08-18 DIAGNOSIS — N189 Chronic kidney disease, unspecified: Secondary | ICD-10-CM

## 2021-08-18 LAB — BASIC METABOLIC PANEL
BUN/Creatinine Ratio: 28 — ABNORMAL HIGH (ref 9–23)
BUN: 49 mg/dL — ABNORMAL HIGH (ref 6–24)
CO2: 19 mmol/L — ABNORMAL LOW (ref 20–29)
Calcium: 9.7 mg/dL (ref 8.7–10.2)
Chloride: 104 mmol/L (ref 96–106)
Creatinine, Ser: 1.73 mg/dL — ABNORMAL HIGH (ref 0.57–1.00)
Glucose: 94 mg/dL (ref 70–99)
Potassium: 5.9 mmol/L — ABNORMAL HIGH (ref 3.5–5.2)
Sodium: 138 mmol/L (ref 134–144)
eGFR: 35 mL/min/{1.73_m2} — ABNORMAL LOW (ref >59)

## 2021-09-02 ENCOUNTER — Ambulatory Visit (INDEPENDENT_AMBULATORY_CARE_PROVIDER_SITE_OTHER): Payer: Medicaid Other | Admitting: Clinical

## 2021-09-02 ENCOUNTER — Telehealth: Payer: Self-pay | Admitting: Physician Assistant

## 2021-09-02 ENCOUNTER — Other Ambulatory Visit: Payer: Self-pay

## 2021-09-02 DIAGNOSIS — F4323 Adjustment disorder with mixed anxiety and depressed mood: Secondary | ICD-10-CM | POA: Diagnosis not present

## 2021-09-02 NOTE — Telephone Encounter (Signed)
error 

## 2021-09-07 NOTE — BH Specialist Note (Signed)
Integrated Behavioral Health via Telemedicine Visit  09/02/2021 [name] [id number]  Number of [place] visits: 5 Session Start time: 10:30am  Session End time: 10:55am Total time:  25 minutes  Referring Provider: Juluis Mire, NP Patient/Family location: Home [facility] Provider location: [facility] All persons participating in visit: Pt and LCSWA Types of Service: Individual psychotherapy and Telephone visit  I connected with Shiela D Prouty via Telephone and verified that I am speaking with the correct person using two identifiers. Discussed confidentiality: Yes   I discussed the limitations of telemedicine and the availability of in person appointments.  Discussed there is a possibility of technology failure and discussed alternative modes of communication if that failure occurs.  I discussed that engaging in this telemedicine visit, they consent to the provision of behavioral healthcare and the services will be billed under their insurance.  Patient and/or legal guardian expressed understanding and consented to Telemedicine visit: Yes   Presenting Concerns: Patient and/or family reports the following symptoms/concerns: Reports feeling down due to finances. Reports that she has received assistance with utilities but is worried about rent. Reports that her landlord is being patient with her. Reports that her boyfriend is planning to live with her to help financially. 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 Reports being unable to perform intense work duties due to physical health. Reports that she is still waiting on disability.  Duration of problem: 6 months; Severity of problem: moderate  Patient and/or Family's Strengths/Protective Factors: Sense of purpose and Spirituality  Goals Addressed: Patient will:  Reduce symptoms of: anxiety and depression   Increase knowledge and/or ability of: coping skills    Demonstrate ability to: Increase healthy adjustment to current life circumstances  Progress towards Goals: Ongoing  Interventions: Interventions utilized:  CBT Cognitive Behavioral Therapy, Supportive Counseling, and Link to [employer] Standardized Assessments completed: Not Needed  Patient and/or Family Response: Pt receptive to tx. Pt receptive to thought reframing to decrease negative thoughts. Pt receptive to utilizing community resources for financial assistance.   Assessment: Denies SI/HI. Patient currently experiencing stress related to finances. Pt has experienced physical health changes and is experiencing financial problems as a result.    Patient may benefit from utilizing provided community resources. LCSWA utilized thought reframing to decrease negative thoughts. Pt has a therapist with [facility] whom she sees weekly. LCSWA encouraged pt to continue tx with [facility] and fu if further support is needed. .  Plan: Follow up with behavioral health clinician on : PRN Behavioral recommendations: Fu if needed Referral(s): Community Resources:  Finances  I discussed the assessment and treatment plan with the patient and/or parent/guardian. They were provided an opportunity to ask questions and all were answered. They agreed with the plan and demonstrated an understanding of the instructions.   They were advised to call back or seek an in-person evaluation if the symptoms worsen or if the condition fails to improve as anticipated.  Stina Gane C Flordia Kassem, LCSW

## 2021-09-15 ENCOUNTER — Telehealth: Payer: Self-pay | Admitting: Physician Assistant

## 2021-09-15 NOTE — Telephone Encounter (Signed)
Farrel Conners, a therapist with [facility], called in stating the pt had had an MRI and she received the results from [place], but she isn't skilled at reading them. She would like to explain it to the patient and wants to understand it better herself. She requests a call back at [phone] Ext [phone]

## 2021-09-17 ENCOUNTER — Telehealth: Payer: Self-pay | Admitting: Physician Assistant

## 2021-09-17 NOTE — Telephone Encounter (Signed)
Called and left Altha Harm a message and informing her that patient has received her MRI results and if she is needing more clarification she is more than welcome to schedule a f/u with Clarise Cruz. Informed Altha Harm that it is out of my scope of practice to explain in detail MRI results to patients and unfortunately Clarise Cruz is unable to call to do this as well. Provided my contact information incase she would like to call me back.

## 2021-09-17 NOTE — Telephone Encounter (Signed)
[name] called from alcohol [facility] drug services. They have the MRI results for patient but the patient does not remember anything. [name] reached out to her doctor but they told her to sch with neuro to get clarity. She would like a zoom call with her and patient to go over results.   [name] [phone] ext [phone]

## 2021-09-21 NOTE — Telephone Encounter (Signed)
Called [name] back and left a message letting her know that patient will need to schedule a follow up with sara to go over MRI results. Informed her that we cannot zoom call her to go over results and a follow up has to be made.

## 2021-10-21 ENCOUNTER — Encounter (INDEPENDENT_AMBULATORY_CARE_PROVIDER_SITE_OTHER): Payer: Self-pay | Admitting: Primary Care

## 2021-10-21 ENCOUNTER — Other Ambulatory Visit: Payer: Self-pay

## 2021-10-21 ENCOUNTER — Ambulatory Visit (INDEPENDENT_AMBULATORY_CARE_PROVIDER_SITE_OTHER): Payer: Medicaid Other | Admitting: Primary Care

## 2021-10-21 VITALS — BP 153/93 | HR 73 | Temp 98.2°F | Ht 66.0 in | Wt 143.6 lb

## 2021-10-21 DIAGNOSIS — I10 Essential (primary) hypertension: Secondary | ICD-10-CM

## 2021-10-21 DIAGNOSIS — G8929 Other chronic pain: Secondary | ICD-10-CM

## 2021-10-21 DIAGNOSIS — M25572 Pain in left ankle and joints of left foot: Secondary | ICD-10-CM | POA: Diagnosis not present

## 2021-10-21 DIAGNOSIS — M25571 Pain in right ankle and joints of right foot: Secondary | ICD-10-CM | POA: Diagnosis not present

## 2021-10-21 DIAGNOSIS — Z1211 Encounter for screening for malignant neoplasm of colon: Secondary | ICD-10-CM

## 2021-10-21 MED ORDER — CLONIDINE 0.2 MG/24HR TD PTWK
0.2000 mg | MEDICATED_PATCH | TRANSDERMAL | 6 refills | Status: DC
  Filled 2021-10-21: qty 4, 28d supply, fill #0
  Filled 2021-11-18 – 2021-12-15 (×3): qty 4, 28d supply, fill #1
  Filled 2022-01-21: qty 4, 28d supply, fill #2
  Filled 2022-02-24: qty 4, 28d supply, fill #3
  Filled 2022-03-30: qty 4, 28d supply, fill #4
  Filled 2022-05-06: qty 4, 28d supply, fill #5
  Filled 2022-06-06 – 2022-06-23 (×2): qty 4, 28d supply, fill #6

## 2021-10-21 NOTE — Patient Instructions (Signed)

## 2021-10-22 ENCOUNTER — Other Ambulatory Visit: Payer: Self-pay

## 2021-10-25 ENCOUNTER — Other Ambulatory Visit: Payer: Self-pay

## 2021-10-25 ENCOUNTER — Encounter (INDEPENDENT_AMBULATORY_CARE_PROVIDER_SITE_OTHER): Payer: Self-pay | Admitting: Primary Care

## 2021-10-25 ENCOUNTER — Ambulatory Visit (INDEPENDENT_AMBULATORY_CARE_PROVIDER_SITE_OTHER): Payer: Self-pay | Admitting: Primary Care

## 2021-10-25 VITALS — BP 149/90 | HR 65 | Temp 98.0°F | Ht 66.0 in | Wt 141.2 lb

## 2021-10-25 DIAGNOSIS — I10 Essential (primary) hypertension: Secondary | ICD-10-CM

## 2021-10-25 DIAGNOSIS — G894 Chronic pain syndrome: Secondary | ICD-10-CM

## 2021-10-26 NOTE — Progress Notes (Signed)
? ?Established Patient Office Visit ? ?Subjective:  ?Patient ID: [name], female    DOB: [birth year]  Age: 51 y.o. MRN: [id number] ? ?CC:  ?Chief Complaint  ?Patient presents with  ? return to work   ?  Patient admits to still having pain but would like to return to work without restrictions so that she can get more hours  ? ? ?HPI ?[name] is a 51 year old frail female using a Rolator for walking presents for requesting a return to work without restriction so she can get more hours despite her ankles and feet continue to hurt. She works as a Educational psychologist. ? ?Past Medical History:  ?Diagnosis Date  ? Alcohol dependence ([place])   ? Hypertension   ? Kidney stones   ? ? ?Past Surgical History:  ?Procedure Laterality Date  ? LEEP    ? LITHOTRIPSY    ? TUBAL LIGATION    ? ? ?Family History  ?Problem Relation Age of Onset  ? Thyroid disease Mother   ? ? ?Social History  ? ?Socioeconomic History  ? Marital status: Divorced  ?  Spouse name: Not on file  ? Number of children: 2  ? Years of education: 49  ? Highest education level: Not on file  ?Occupational History  ? Not on file  ?Tobacco Use  ? Smoking status: Every Day  ?  Packs/day: 1.00  ?  Types: Cigarettes  ? Smokeless tobacco: Never  ?Vaping Use  ? Vaping Use: Never used  ?Substance and Sexual Activity  ? Alcohol use: Not Currently  ? Drug use: Not Currently  ? Sexual activity: Not on file  ?Other Topics Concern  ? Not on file  ?Social History Narrative  ? Right handed  ? One story home  ? Caffeine yes  ? ?Social Determinants of Health  ? ?Financial Resource Strain: Not on file  ?Food Insecurity: Not on file  ?Transportation Needs: Not on file  ?Physical Activity: Not on file  ?Stress: Not on file  ?Social Connections: Not on file  ?Intimate Partner Violence: Not on file  ? ? ?Outpatient Medications Prior to Visit  ?Medication Sig Dispense Refill  ? Aspirin-Caffeine (BC FAST PAIN RELIEF) 845-65 MG PACK Take 1 Package by mouth daily as needed (pain).    ?  Buprenorphine HCl-Naloxone HCl (SUBOXONE SL) Place 4 mg under the tongue daily.    ? folic acid (FOLVITE) 1 MG tablet Take 1 tablet (1 mg total) by mouth daily. 30 tablet 0  ? gabapentin (NEURONTIN) 300 MG capsule Take 1 capsule (300 mg total) by mouth 3 (three) times daily. 90 capsule 1  ? thiamine 100 MG tablet Take 1 tablet (100 mg total) by mouth daily. 30 tablet 0  ? cloNIDine (CATAPRES - DOSED IN MG/24 HR) 0.2 mg/24hr patch Place 1 patch (0.2 mg total) onto the skin once a week. 4 patch 12  ? losartan (COZAAR) 100 MG tablet Take 1 tablet (100 mg total) by mouth daily. 30 tablet 3  ? predniSONE (DELTASONE) 10 MG tablet Take  3 tablets (24m) by mouth daily x 3 days, 2 tabs (214m daily x 3 days, 1 tab (1035mdaily x 3 days then off (Patient not taking: Reported on 06/22/2021) 18 tablet 0  ? ?No facility-administered medications prior to visit.  ? ? ?Allergies  ?Allergen Reactions  ? Penicillins Hives  ? ? ?ROS ?Comprehensive ROS Pertinent positive and negative noted in HPI   ? ?  ?Objective:  ?  BP (!) 153/93 (BP Location: Left Arm, Patient Position: Sitting, Cuff Size: Normal)   Pulse 73   Temp 98.2 ?F (36.8 ?C) (Oral)   Ht 5' 6"  (1.676 m)   Wt 143 lb 9.6 oz (65.1 kg)   SpO2 100%   BMI 23.18 kg/m?   ?Physical Exam ?Vitals reviewed.  ?Constitutional:   ?   Comments: thin  ?HENT:  ?   Head: Normocephalic.  ?   Right Ear: Tympanic membrane normal.  ?   Left Ear: Tympanic membrane normal.  ?   Nose: Nose normal.  ?Eyes:  ?   Extraocular Movements: Extraocular movements intact.  ?Cardiovascular:  ?   Rate and Rhythm: Normal rate and regular rhythm.  ?Pulmonary:  ?   Effort: Pulmonary effort is normal.  ?   Breath sounds: Normal breath sounds.  ?Abdominal:  ?   General: Abdomen is flat. Bowel sounds are normal.  ?   Palpations: Abdomen is soft.  ?Musculoskeletal:     ?   General: Normal range of motion.  ?   Cervical back: Normal range of motion.  ?Skin: ?   General: Skin is warm and dry.  ?Neurological:  ?    Mental Status: She is alert and oriented to person, place, and time.  ?Psychiatric:     ?   Mood and Affect: Mood normal.     ?   Behavior: Behavior normal.     ?   Thought Content: Thought content normal.     ?   Judgment: Judgment normal.  ? ?BP (!) 153/93 (BP Location: Left Arm, Patient Position: Sitting, Cuff Size: Normal)   Pulse 73   Temp 98.2 ?F (36.8 ?C) (Oral)   Ht 5' 6"  (1.676 m)   Wt 143 lb 9.6 oz (65.1 kg)   SpO2 100%   BMI 23.18 kg/m?  ?Wt Readings from Last 3 Encounters:  ?10/25/21 141 lb 3.2 oz (64 kg)  ?10/21/21 143 lb 9.6 oz (65.1 kg)  ?06/22/21 115 lb (52.2 kg)  ? ? ? ?Health Maintenance Due  ?Topic Date Due  ? PAP SMEAR-Modifier  Never done  ? COLONOSCOPY (Pts 45-32yr Insurance coverage will need to be confirmed)  01/18/2015  ? MAMMOGRAM  Never done  ? Zoster Vaccines- Shingrix (1 of 2) Never done  ? COVID-19 Vaccine (3 - Booster for Pfizer series) 02/03/2020  ? ? ?There are no preventive care reminders to display for this patient. ? ?Lab Results  ?Component Value Date  ? TSH 0.61 06/22/2021  ? ?Lab Results  ?Component Value Date  ? WBC 8.5 06/22/2021  ? HGB 12.6 06/22/2021  ? HCT 37.6 06/22/2021  ? MCV 94.3 06/22/2021  ? PLT 401.0 (H) 06/22/2021  ? ?Lab Results  ?Component Value Date  ? NA 138 08/17/2021  ? K 5.9 (H) 08/17/2021  ? CO2 19 (L) 08/17/2021  ? GLUCOSE 94 08/17/2021  ? BUN 49 (H) 08/17/2021  ? CREATININE 1.73 (H) 08/17/2021  ? BILITOT 0.6 04/14/2021  ? ALKPHOS 142 (H) 04/14/2021  ? AST 31 04/14/2021  ? ALT 32 04/14/2021  ? PROT 5.3 (L) 04/14/2021  ? ALBUMIN 2.5 (L) 04/14/2021  ? CALCIUM 9.7 08/17/2021  ? ANIONGAP 5 04/14/2021  ? EGFR 35 (L) 08/17/2021  ? ?Lab Results  ?Component Value Date  ? CHOL 167 04/10/2021  ? ?Lab Results  ?Component Value Date  ? HDL 79 04/10/2021  ? ?Lab Results  ?Component Value Date  ? L[place]79 04/10/2021  ? ?Lab Results  ?  Component Value Date  ? TRIG 46 04/10/2021  ? ?Lab Results  ?Component Value Date  ? CHOLHDL 2.1 04/10/2021  ? ?No results found  for: HGBA1C ? ?  ?Assessment & Plan:  ?[name] was seen today for return to work . ? ?Diagnoses and all orders for this visit: ? ?Colon cancer screening ?-     Ambulatory referral to Gastroenterology ? ?Essential hypertension ?BP goal - < 130/80 ?Explained that having normal blood pressure is the goal and medications are helping to get to goal and maintain normal blood pressure. ?DIET: Limit salt intake, read nutrition labels to check salt content, limit fried and high fatty foods  ?Avoid using multisymptom OTC cold preparations that generally contain sudafed which can rise BP. Consult with pharmacist on best cold relief products to use for persons with HTN ?EXERCISE ?Discussed incorporating exercise such as walking - 30 minutes most days of the week and can do in 10 minute intervals    ?-     cloNIDine (CATAPRES - DOSED IN MG/24 HR) 0.2 mg/24hr patch; Apply 1 patch (0.2 mg total) onto the skin once a week. ? ?Chronic ankle pain, bilateral ?-     Ambulatory referral to Pain Clinic ? ?  ?Meds ordered this encounter  ?Medications  ? cloNIDine (CATAPRES - DOSED IN MG/24 HR) 0.2 mg/24hr patch  ?  Sig: Apply 1 patch (0.2 mg total) onto the skin once a week.  ?  Dispense:  12 patch  ?  Refill:  6  ? ? ?Follow-up: Return for keep schedule appt for HTN.  ? ? ?Kerin Perna, NP ?

## 2021-10-27 ENCOUNTER — Other Ambulatory Visit: Payer: Self-pay

## 2021-10-28 NOTE — Progress Notes (Signed)
?[place] ? ? ?[name] is a 51 y.o. female presents for hypertension evaluation, Denies shortness of breath, headaches, chest pain or lower extremity edema, sudden onset, vision changes, unilateral weakness, dizziness, paresthesias  ? ?Patient denies adherence with medications. ? ?Dietary habits include: trying to monitor ?Exercise habits include:no ?Family / Social history: unknown ? ? ?Past Medical History:  ?Diagnosis Date  ? Alcohol dependence ([place])   ? Hypertension   ? Kidney stones   ? ?Past Surgical History:  ?Procedure Laterality Date  ? LEEP    ? LITHOTRIPSY    ? TUBAL LIGATION    ? ?Allergies  ?Allergen Reactions  ? Penicillins Hives  ? ?Current Outpatient Medications on File Prior to Visit  ?Medication Sig Dispense Refill  ? Aspirin-Caffeine (BC FAST PAIN RELIEF) 845-65 MG PACK Take 1 Package by mouth daily as needed (pain).    ? Buprenorphine HCl-Naloxone HCl (SUBOXONE SL) Place 4 mg under the tongue daily.    ? cloNIDine (CATAPRES - DOSED IN MG/24 HR) 0.2 mg/24hr patch Apply 1 patch (0.2 mg total) onto the skin once a week. 12 patch 6  ? folic acid (FOLVITE) 1 MG tablet Take 1 tablet (1 mg total) by mouth daily. 30 tablet 0  ? gabapentin (NEURONTIN) 300 MG capsule Take 1 capsule (300 mg total) by mouth 3 (three) times daily. 90 capsule 1  ? thiamine 100 MG tablet Take 1 tablet (100 mg total) by mouth daily. 30 tablet 0  ? ?No current facility-administered medications on file prior to visit.  ? ?Social History  ? ?Socioeconomic History  ? Marital status: Divorced  ?  Spouse name: Not on file  ? Number of children: 2  ? Years of education: 54  ? Highest education level: Not on file  ?Occupational History  ? Not on file  ?Tobacco Use  ? Smoking status: Every Day  ?  Packs/day: 1.00  ?  Types: Cigarettes  ? Smokeless tobacco: Never  ?Vaping Use  ? Vaping Use: Never used  ?Substance and Sexual Activity  ? Alcohol use: Not Currently  ? Drug use: Not Currently  ? Sexual activity:  Not on file  ?Other Topics Concern  ? Not on file  ?Social History Narrative  ? Right handed  ? One story home  ? Caffeine yes  ? ?Social Determinants of Health  ? ?Financial Resource Strain: Not on file  ?Food Insecurity: Not on file  ?Transportation Needs: Not on file  ?Physical Activity: Not on file  ?Stress: Not on file  ?Social Connections: Not on file  ?Intimate Partner Violence: Not on file  ? ?Family History  ?Problem Relation Age of Onset  ? Thyroid disease Mother   ? ?OBJECTIVE: ? ?Vitals:  ? 10/25/21 1113  ?BP: (!) 149/90  ?Pulse: 65  ?Temp: 98 ?F (36.7 ?C)  ?TempSrc: Oral  ?SpO2: 100%  ?Weight: 141 lb 3.2 oz (64 kg)  ?Height: _0  (1.676 m)  ? ? ?Physical Exam ?Vitals reviewed.  ?Constitutional:   ?   Appearance: Normal appearance.  ?HENT:  ?   Head: Normocephalic.  ?   Right Ear: Tympanic membrane normal.  ?   Left Ear: Tympanic membrane normal.  ?   Nose: Nose normal.  ?Eyes:  ?   Extraocular Movements: Extraocular movements intact.  ?   Pupils: Pupils are equal, round, and reactive to light.  ?Cardiovascular:  ?   Rate and Rhythm: Normal rate and regular rhythm.  ?Pulmonary:  ?  Effort: Pulmonary effort is normal.  ?   Breath sounds: Normal breath sounds.  ?Abdominal:  ?   General: Abdomen is flat. There is distension.  ?   Palpations: Abdomen is soft.  ?Musculoskeletal:     ?   General: Normal range of motion.  ?   Cervical back: Normal range of motion.  ?Skin: ?   General: Skin is warm and dry.  ?Neurological:  ?   Mental Status: She is alert and oriented to person, place, and time.  ?Psychiatric:     ?   Mood and Affect: Mood normal.     ?   Behavior: Behavior normal.     ?   Thought Content: Thought content normal.     ?   Judgment: Judgment normal.  ? ? ?ROS ?Comprehensive ROS Pertinent positive and negative noted in HPI   ?Last 3 Office BP readings: ?BP Readings from Last 3 Encounters:  ?10/25/21 (!) 149/90  ?10/21/21 (!) 153/93  ?06/22/21 117/79  ? ? ?BMET ?   ?Component Value Date/Time  ? NA  138 08/17/2021 1638  ? K 5.9 (H) 08/17/2021 1638  ? CL 104 08/17/2021 1638  ? CO2 19 (L) 08/17/2021 1638  ? GLUCOSE 94 08/17/2021 1638  ? GLUCOSE 85 04/14/2021 0518  ? BUN 49 (H) 08/17/2021 1638  ? CREATININE 1.73 (H) 08/17/2021 1638  ? CALCIUM 9.7 08/17/2021 1638  ? GFRNONAA >60 04/14/2021 0518  ? GFRAA  05/15/2008 0154  ?  >60        ?The eGFR has been calculated ?using the MDRD equation. ?This calculation has not been ?validated in all clinical  ? ? ?Renal function: ?CrCl cannot be calculated (Patient's most recent lab result is older than the maximum 21 days allowed.). ? ?Clinical ASCVD: Yes  ?The 10-year ASCVD risk score (Arnett DK, et al., 2019) is: 2.6% ?  Values used to calculate the score: ?    Age: 11 years ?    Sex: Female ?    Is Non-Hispanic African American: No ?    Diabetic: No ?    Tobacco smoker: Yes ?    Systolic Blood Pressure: 191 mmHg ?    Is BP treated: No ?    HDL Cholesterol: 79 mg/dL ?    Total Cholesterol: 167 mg/dL ? ?ASCVD risk factors include- [place] ? ? ?ASSESSMENT & PLAN: ?Malu was seen today for hypertension. ? ?Diagnoses and all orders for this visit: ? ?Chronic pain syndrome ?-     Ambulatory referral to Physical Therapy ? ?Essential hypertension ?-Counseled on lifestyle modifications for blood pressure control including reduced dietary sodium, increased exercise, weight reduction and adequate sleep. Also, educated patient about the risk for cardiovascular events, stroke and heart attack. Also counseled patient about the importance of medication adherence. If you participate in smoking, it is important to stop using tobacco as this will increase the risks associated with uncontrolled blood pressure.  ? ?-Hypertension longstanding diagnosed currently on no medications. Patient is not adherent with current medications.  ? ?Goal BP:  ?For patients younger than 60: Goal BP < 130/80. ?For patients 60 and older: Goal BP < 140/90. ?For patients with diabetes: Goal BP < 130/80. ?Your most  recent BP: 149/90 ? ?Minimize salt intake. ?Minimize alcohol intake ? ? ?This note has been created with Surveyor, quantity. Any transcriptional errors are unintentional.  ? ?Kerin Perna, NP ?10/28/2021, 9:21 PM ?  ?

## 2021-11-12 ENCOUNTER — Other Ambulatory Visit: Payer: Self-pay

## 2021-11-15 ENCOUNTER — Other Ambulatory Visit: Payer: Self-pay

## 2021-11-16 ENCOUNTER — Other Ambulatory Visit: Payer: Self-pay

## 2021-11-16 MED ORDER — TRAMADOL HCL 50 MG PO TABS
ORAL_TABLET | ORAL | 0 refills | Status: DC
  Filled 2021-11-16: qty 60, 15d supply, fill #0

## 2021-11-17 ENCOUNTER — Other Ambulatory Visit: Payer: Self-pay

## 2021-11-18 ENCOUNTER — Other Ambulatory Visit: Payer: Self-pay

## 2021-11-22 ENCOUNTER — Ambulatory Visit (INDEPENDENT_AMBULATORY_CARE_PROVIDER_SITE_OTHER): Payer: Medicaid Other | Admitting: Primary Care

## 2021-11-25 ENCOUNTER — Other Ambulatory Visit: Payer: Self-pay

## 2021-11-29 ENCOUNTER — Encounter: Payer: Self-pay | Admitting: Psychology

## 2021-11-29 DIAGNOSIS — F1191 Opioid use, unspecified, in remission: Secondary | ICD-10-CM | POA: Insufficient documentation

## 2021-11-29 DIAGNOSIS — G4733 Obstructive sleep apnea (adult) (pediatric): Secondary | ICD-10-CM | POA: Insufficient documentation

## 2021-11-29 DIAGNOSIS — F411 Generalized anxiety disorder: Secondary | ICD-10-CM | POA: Insufficient documentation

## 2021-11-29 DIAGNOSIS — K5090 Crohn's disease, unspecified, without complications: Secondary | ICD-10-CM | POA: Insufficient documentation

## 2021-11-29 DIAGNOSIS — F4323 Adjustment disorder with mixed anxiety and depressed mood: Secondary | ICD-10-CM | POA: Insufficient documentation

## 2021-11-29 NOTE — Progress Notes (Signed)
? ?NEUROPSYCHOLOGICAL EVALUATION ?[place] ?[place] Department of Neurology ? ?Date of Evaluation: November 30, 2021 ? ?Reason for Referral:  ? ?[name] is a 51 y.o. Caucasian female referred by  Sharene Butters, PA-C , to characterize her current cognitive functioning and assist with diagnostic clarity and treatment planning in the context of subjective cognitive decline and prior alcohol abuse/dependency.  ? ?Assessment and Plan:  ? ?Clinical Impression(s): ?[name]' pattern of performance is suggestive of a primary impairment surrounding encoding (i.e., learning) and retrieval aspects of verbal memory. Very mild weaknesses were also exhibited across processing speed, executive functioning, and phonemic fluency. Performance was appropriate relative to premorbid intellectual estimations across attention/concentration, receptive language, semantic fluency, confrontation naming, visuospatial abilities, and visual learning and memory. [name] denied difficulties completing instrumental activities of daily living (ADLs) independently. As such, given evidence for cognitive dysfunction described above, she is best characterized as demonstrating a Mild Neurocognitive Disorder ("mild cognitive impairment"). However, the milder nature of this presentation should be emphasized presently as I am not yet convinced of an underlying neurological etiology.  ? ?Across mood-related questionnaires, [name] described acute symptoms of moderate anxiety (depression was within normal limits). Across a more comprehensive questionnaire, no clinical subscales were elevated; however, there was evidence across validity scales that she may have been presenting herself in an overly favorable light and diminishing psychiatric distress. Near-elevations were seen across the somatic complaints and drug abuse subscales. Responses suggest an individual with notable preoccupation about physical functioning and  health concerns which is often accompanied by milder symptoms of anxiety and depression. Responses also suggest that drug use could be a source of problems in her current life (records suggest a remote history of heroin use and alcohol abuse but she denied active abuse/dependence during interview).  ? 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 Basal ganglia calcification (also known as Fahr's syndrome) represents a very rare neurodegenerative condition which leads to abnormal calcium deposits in various regions of the brain. Given that the basal ganglia is most often involved in this syndrome, this is important to highlight so her medical team can actively monitor this over time. It is also important to highlight that [name] does not currently exhibit neuropsychiatric symptoms consistent with Fahr's syndrome (e.g., parkinsonian features, seizure activity, progressive cognitive decline) and there is research to say that incidental and asymptomatic mineralization deposits can be seen across routine neuroimaging. Overall, the likelihood of this condition appears low at the present time.  ? ?There could be a vascular contribution to ongoing dysfunction given her medical history (i.e., hypertension, obstructive sleep apnea) and recent neuroimaging suggesting mild vascular related changes to the brain. Her pattern of weakness would fall in line with a vascular culprit reasonably well. Despite trouble learning and later recalling verbal information, [name] was able to demonstrate some retention of verbal information, with retention rates ranging from 55% to 75%, and performed well across recognition trials. She also performed well across visual memory, confrontation naming, and semantic fluency. Taken together, this is inconsistent with an early-onset Alzheimer's disease presentation. Given her age, this presentation would  also be extremely rare even prior to testing procedures being conducted. She does not demonstrate behavioral or cognitive patterns concerning for Lewy body dementia, frontotemporal dementia, or a more rare parkinsonian presentation. ? ?At the present time, weaknesses across testing and subjective day-to-day dysfunction is most likely caused by a combination of  cerebrovascular changes, mild to moderate psychiatric distress, chronic pain, various life stressors, and intraindividual longstanding weaknesses. Continued medical monitoring will be important moving forward. ? ?Recommendations: ?A repeat neuropsychological evaluation in 24-36 months (or sooner if functional decline is noted) is recommended to assess the trajectory of future cognitive decline should it occur. This will also aid in future efforts towards improved diagnostic clarity. ? ?While she denied a history of sleep apnea, medical records suggest that this condition is present. It may be that [name] would benefit from an updated sleep study to officially rule in or out this condition. If present and untreated, this will create and worsen cognitive dysfunction. It would also increase her risk for heart attack, stroke, and future cognitive decline.  ? ?A combination of medication and psychotherapy has been shown to be most effective at treating symptoms of anxiety and depression. As such, Ms. Gaster is encouraged to speak with her prescribing physician regarding medication adjustments to optimally manage these symptoms. Likewise, Ms. Joyner is encouraged to continue ongoing short-term therapy to address symptoms of psychiatric distress. She would benefit from an active and collaborative therapeutic environment, rather than one purely supportive in nature. Recommended treatment modalities include Cognitive Behavioral Therapy (CBT) or Acceptance and Commitment Therapy (ACT). ? ?Ms. Blase is encouraged to attend to lifestyle factors for brain health  (e.g., regular physical exercise, good nutrition habits, regular participation in cognitively-stimulating activities, and general stress management techniques), which are likely to have benefits for both emotional adjustment and cognition. In fact, in addition to promoting good general health, regular exercise incorporating aerobic activities (e.g., brisk walking, jogging, cycling, etc.) has been demonstrated to be a very effective treatment for depression and stress, with similar efficacy rates to both antidepressant medication and psychotherapy. Optimal control of vascular risk factors (including safe cardiovascular exercise and adherence to dietary recommendations) is encouraged. Continued participation in activities which provide mental stimulation and social interaction is also recommended.  ? ?When learning new information, she would benefit from information being broken up into small, manageable pieces. She may also find it helpful to articulate the material in her own words and in a context to promote encoding at the onset of a new task. This material may need to be repeated multiple times to promote encoding. ? ?Memory can be improved using internal strategies such as rehearsal, repetition, chunking, mnemonics, association, and imagery. External strategies such as written notes in a consistently used memory journal, visual and nonverbal auditory cues such as a calendar on the refrigerator or appointments with alarm, such as on a cell phone, can also help maximize recall.   ? ?To address problems with processing speed, she may wish to consider: ?  -Ensuring that she is alerted when essential material or instructions are being presented ?  -Adjusting the speed at which new information is presented ?  -Allowing for more time in comprehending, processing, and responding in conversation ? ?To address problems with fluctuating attention, she may wish to consider: ?  -Avoiding external distractions when needing to  concentrate ?  -Limiting exposure to fast paced environments with multiple sensory demands ?  -Writing down complicated information and using checklists ?  -Attempting and completing one task at a time (i.e., no

## 2021-11-30 ENCOUNTER — Ambulatory Visit: Payer: Medicaid Other

## 2021-11-30 ENCOUNTER — Ambulatory Visit (INDEPENDENT_AMBULATORY_CARE_PROVIDER_SITE_OTHER): Payer: Medicaid Other | Admitting: Psychology

## 2021-11-30 ENCOUNTER — Other Ambulatory Visit: Payer: Self-pay

## 2021-11-30 ENCOUNTER — Encounter: Payer: Self-pay | Admitting: Psychology

## 2021-11-30 DIAGNOSIS — F1021 Alcohol dependence, in remission: Secondary | ICD-10-CM

## 2021-11-30 DIAGNOSIS — R413 Other amnesia: Secondary | ICD-10-CM | POA: Diagnosis not present

## 2021-11-30 DIAGNOSIS — R4189 Other symptoms and signs involving cognitive functions and awareness: Secondary | ICD-10-CM

## 2021-11-30 DIAGNOSIS — F329 Major depressive disorder, single episode, unspecified: Secondary | ICD-10-CM | POA: Insufficient documentation

## 2021-11-30 DIAGNOSIS — F411 Generalized anxiety disorder: Secondary | ICD-10-CM

## 2021-11-30 DIAGNOSIS — F330 Major depressive disorder, recurrent, mild: Secondary | ICD-10-CM

## 2021-11-30 DIAGNOSIS — I6781 Acute cerebrovascular insufficiency: Secondary | ICD-10-CM

## 2021-11-30 NOTE — Progress Notes (Signed)
? ?  Psychometrician Note ?  ?Cognitive testing was administered to [name] by Cruzita Lederer, B.S. (psychometrist) under the supervision of Dr. Christia Reading, Ph.D., licensed psychologist on 11/30/2021. [name] did not appear overtly distressed by the testing session per behavioral observation or responses across self-report questionnaires. Rest breaks were offered.  ?  ?The battery of tests administered was selected by Dr. Christia Reading, Ph.D. with consideration to [name]'s current level of functioning, the nature of her symptoms, emotional and behavioral responses during interview, level of literacy, observed level of motivation/effort, and the nature of the referral question. This battery was communicated to the psychometrist. Communication between Dr. Christia Reading, Ph.D. and the psychometrist was ongoing throughout the evaluation and Dr. Christia Reading, Ph.D. was immediately accessible at all times. Dr. Christia Reading, Ph.D. provided supervision to the psychometrist on the date of this service to the extent necessary to assure the quality of all services provided.  ?  ?[name] will return within approximately 1-2 weeks for an interactive feedback session with Dr. Melvyn Novas at which time her test performances, clinical impressions, and treatment recommendations will be reviewed in detail. [name] understands she can contact our office should she require our assistance before this time. ? ?A total of 180 minutes of billable time were spent face-to-face with [name] by the psychometrist. This includes both test administration and scoring time. Billing for these services is reflected in the clinical report generated by Dr. Christia Reading, Ph.D. ? ?This note reflects time spent with the psychometrician and does not include test scores or any clinical interpretations made by Dr. Melvyn Novas. The full report will follow in a separate note.  ?

## 2021-12-07 ENCOUNTER — Other Ambulatory Visit: Payer: Self-pay

## 2021-12-13 ENCOUNTER — Encounter: Payer: Medicaid Other | Admitting: Psychology

## 2021-12-15 ENCOUNTER — Other Ambulatory Visit: Payer: Self-pay

## 2021-12-16 ENCOUNTER — Other Ambulatory Visit: Payer: Self-pay

## 2021-12-17 ENCOUNTER — Other Ambulatory Visit: Payer: Self-pay

## 2021-12-22 ENCOUNTER — Other Ambulatory Visit: Payer: Self-pay

## 2021-12-22 MED ORDER — BUPRENORPHINE HCL 8 MG SL SUBL
SUBLINGUAL_TABLET | SUBLINGUAL | 0 refills | Status: DC
  Filled 2021-12-22: qty 15, 30d supply, fill #0

## 2021-12-22 MED ORDER — TRAMADOL HCL 50 MG PO TABS
ORAL_TABLET | ORAL | 0 refills | Status: DC
  Filled 2021-12-22: qty 120, 30d supply, fill #0

## 2021-12-23 ENCOUNTER — Other Ambulatory Visit: Payer: Self-pay

## 2021-12-24 ENCOUNTER — Other Ambulatory Visit: Payer: Self-pay

## 2022-01-03 ENCOUNTER — Ambulatory Visit: Payer: Medicaid Other | Attending: Primary Care | Admitting: Physical Therapy

## 2022-01-03 NOTE — Therapy (Incomplete)
?OUTPATIENT PHYSICAL THERAPY THORACOLUMBAR EVALUATION ? ? ?Patient Name: [name] ?MRN: [id number] ?DOB:[birth year], 51 y.o., female ?Today's Date: 01/03/2022 ? ? ? ?Past Medical History:  ?Diagnosis Date  ? Alcohol dependence in sustained full remission   ? Benign essential hypertension 04/09/2021  ? Chronic pain syndrome 04/09/2021  ? Crohn's disease   ? Dilated cbd, acquired 04/09/2021  ? Gallstone pancreatitis 04/09/2021  ? Generalized anxiety disorder   ? Gout 04/09/2021  ? History of heroin use   ? Kidney stones   ? Major depressive disorder   ? Obstructive sleep apnea   ? ?Past Surgical History:  ?Procedure Laterality Date  ? LEEP    ? LITHOTRIPSY    ? TUBAL LIGATION    ? ?Patient Active Problem List  ? Diagnosis Date Noted  ? Major depressive disorder   ? Generalized anxiety disorder   ? History of heroin use   ? Crohn's disease   ? Obstructive sleep apnea   ? Gallstone pancreatitis 04/09/2021  ? Gout 04/09/2021  ? Dilated cbd, acquired 04/09/2021  ? Chronic pain syndrome 04/09/2021  ? Benign essential hypertension 04/09/2021  ? Alcohol dependence in sustained full remission   ? ? ?PCP: Kerin Perna, NP  ? ?REFERRING PROVIDER: Kerin Perna, NP  ? ?REFERRING DIAG: Chronic pain syndrome [G89.4] ? ?THERAPY DIAG:  ?No diagnosis found. ? ?ONSET DATE: *** ? ?SUBJECTIVE:                                                                                                                                                                                          ? ?SUBJECTIVE STATEMENT: ?*** ?PERTINENT HISTORY:  ?Generalized Anxiety, major depressive disorder, Gout, Chronic pain syndrome ? ?PAIN:  ?Are you having pain? Yes: NPRS scale: ***/10 ?Pain location: *** ?Pain description: *** ?Aggravating factors: *** ?Relieving factors: *** ? ? ?PRECAUTIONS: {Therapy precautions:24002} ? ?WEIGHT BEARING RESTRICTIONS {Yes ***/No:24003} ? ?FALLS:  ?Has patient fallen in last 6 months? {fallsyesno:27318} ? ?LIVING  ENVIRONMENT: ?Lives with: {[facility] lives with:25569::"lives with their family"} ?Lives in: {Lives in:25570} ?Stairs: {opstairs:27293} ?Has following equipment at home: {Assistive devices:23999} ? ?OCCUPATION: *** ? ?PLOF: {PLOF:24004} ? ?PATIENT GOALS *** ? ? ?OBJECTIVE:  ?*Unless otherwise noted by date, all objective measures were captured on initial evaluation.  ? ?DIAGNOSTIC FINDINGS:  ?No recent imaging for the back ? ?PATIENT SURVEYS:  ?Modified Oswestry ***  ? ?SCREENING FOR RED FLAGS: ?Bowel or bladder incontinence: {Yes/No:304960894} ? ?COGNITION: ? Overall cognitive status: {cognition:24006}   ?  ?SENSATION: ?{sensation:27233} ? ?MUSCLE LENGTH: ?Hamstrings: Right *** deg; Left *** deg ?Thomas test: Right *** deg; Left *** deg ? ?POSTURE:  ?*** ? ?  PALPATION: ?*** ? ?LUMBAR ROM:  ? ?Active  A/PROM  ?01/03/2022  ?Flexion   ?Extension   ?Right lateral flexion   ?Left lateral flexion   ?Right rotation   ?Left rotation   ? (Blank rows = not tested) ? ?LE ROM: ? ?Active  Right ?01/03/2022 Left ?01/03/2022  ?Hip flexion    ?Hip extension    ?Hip abduction    ?Hip adduction    ?Hip internal rotation    ?Hip external rotation    ?Knee flexion    ?Knee extension    ?Ankle dorsiflexion    ?Ankle plantarflexion    ?Ankle inversion    ?Ankle eversion    ? (Blank rows = not tested) ? ?LE MMT: ? ?MMT Right ?01/03/2022 Left ?01/03/2022  ?Hip flexion    ?Hip extension    ?Hip abduction    ?Hip adduction    ?Hip internal rotation    ?Hip external rotation    ?Knee flexion    ?Knee extension    ?Ankle dorsiflexion    ?Ankle plantarflexion    ?Ankle inversion    ?Ankle eversion    ? (Blank rows = not tested) ? ?LUMBAR SPECIAL TESTS:  ?{lumbar special test:25242} ? ?FUNCTIONAL TESTS:  ?{Functional tests:24029} ? ?GAIT: ?Distance walked: *** ?Assistive device utilized: {Assistive devices:23999} ?Level of assistance: {Levels of assistance:24026} ?Comments: *** ? ? ? ?TODAY'S TREATMENT  ?*** ? ? ?PATIENT EDUCATION:  ?Education details:  Evaluation findings, POC, goals, HEP with proper form/ rationale.  ?Person educated: Patient ?Education method: Explanation, Verbal cues, and Handouts ?Education comprehension: {Education Comprehension:25206} ? ? ?HOME EXERCISE PROGRAM: ?*** ? ?ASSESSMENT: ? ?CLINICAL IMPRESSION: ?Patient is a 51 y.o. F who was seen today for physical therapy evaluation and treatment for ***.  ? ? ?OBJECTIVE IMPAIRMENTS {opptimpairments:25111}.  ? ?ACTIVITY LIMITATIONS {activity limitations:25113}.  ? ?PERSONAL FACTORS {Personal factors:25162} are also affecting patient's functional outcome.  ? ? ?REHAB POTENTIAL: {rehabpotential:25112} ? ?CLINICAL DECISION MAKING: {clinical decision making:25114} ? ?EVALUATION COMPLEXITY: {Evaluation complexity:25115} ? ? ?GOALS: ?Goals reviewed with patient? Yes ? ?SHORT TERM GOALS: Target date: {follow up:25551}    (Remove Blue Hyperlink) ? ?*** ?Baseline: ?Goal status: {GOALSTATUS:25110} ? ?2.  *** ?Baseline:  ?Goal status: {GOALSTATUS:25110} ? ? ? ?LONG TERM GOALS: Target date: {follow up:25551}  (Remove Blue Hyperlink) ? ?*** ?Baseline:  ?Goal status: {GOALSTATUS:25110} ? ?2.  *** ?Baseline:  ?Goal status: {GOALSTATUS:25110} ? ?3.  *** ?Baseline:  ?Goal status: {GOALSTATUS:25110} ? ?4.  *** ?Baseline:  ?Goal status: {GOALSTATUS:25110} ? ?5.  *** ?Baseline:  ?Goal status: {GOALSTATUS:25110} ? ?6.  *** ?Baseline:  ?Goal status: {GOALSTATUS:25110} ? ? ?PLAN: ?PT FREQUENCY: 1-2x/week ? ?PT DURATION: {rehab duration:25117} ? ?PLANNED INTERVENTIONS: Therapeutic exercises, Therapeutic activity, Neuromuscular re-education, Balance training, Gait training, Patient/Family education, Joint mobilization, Aquatic Therapy, Dry Needling, Spinal manipulation, Spinal mobilization, Cryotherapy, Moist heat, Taping, Traction, Ultrasound, Ionotophoresis 32m/ml Dexamethasone, and Manual therapy. ? ?PLAN FOR NEXT SESSION: Review/ update HEP PRN. *** ? ? ?Kenyada Hy PT, DPT, LAT, ATC  ?01/03/22  8:15  AM ? ? ? ? ? ?

## 2022-01-04 ENCOUNTER — Other Ambulatory Visit: Payer: Self-pay

## 2022-01-04 ENCOUNTER — Ambulatory Visit (INDEPENDENT_AMBULATORY_CARE_PROVIDER_SITE_OTHER): Payer: Medicaid Other | Admitting: Primary Care

## 2022-01-04 ENCOUNTER — Encounter (INDEPENDENT_AMBULATORY_CARE_PROVIDER_SITE_OTHER): Payer: Self-pay | Admitting: Primary Care

## 2022-01-04 VITALS — BP 169/99 | HR 54 | Temp 98.0°F | Ht 66.0 in | Wt 136.6 lb

## 2022-01-04 DIAGNOSIS — N189 Chronic kidney disease, unspecified: Secondary | ICD-10-CM

## 2022-01-04 DIAGNOSIS — I10 Essential (primary) hypertension: Secondary | ICD-10-CM

## 2022-01-04 MED ORDER — VALSARTAN-HYDROCHLOROTHIAZIDE 160-25 MG PO TABS
1.0000 | ORAL_TABLET | Freq: Every day | ORAL | 1 refills | Status: DC
Start: 2022-01-04 — End: 2022-09-12
  Filled 2022-01-04 – 2022-01-21 (×2): qty 90, 90d supply, fill #0

## 2022-01-04 NOTE — Progress Notes (Addendum)
?[name] ? ?[name], is a 51 y.o. female ? ?KGM:[id number] ? ?[id number]:[id number] ? ?DOB - 01/04/1971 ? ?Chief Complaint  ?Patient presents with  ? Blood Pressure Check  ?    ? ?Subjective:  ? ?[name] is a 51 y.o. female here today for a follow up visit for HTN.  Blood pressure remains extremely elevated.  She is unable to check her blood pressure at home due to the fact she does not have blood pressure device.  Patient has No headache, No chest pain, No abdominal pain - No Nausea, No new weakness tingling or numbness, No Cough - shortness of breath. ? ?No problems updated. ? ?Allergies  ?Allergen Reactions  ? Penicillins Hives  ? ? ?Past Medical History:  ?Diagnosis Date  ? Alcohol dependence in sustained full remission   ? Benign essential hypertension 04/09/2021  ? Chronic pain syndrome 04/09/2021  ? Crohn's disease   ? Dilated cbd, acquired 04/09/2021  ? Gallstone pancreatitis 04/09/2021  ? Generalized anxiety disorder   ? Gout 04/09/2021  ? History of heroin use   ? Kidney stones   ? Major depressive disorder   ? Obstructive sleep apnea   ? ? ?Current Outpatient Medications on File Prior to Visit  ?Medication Sig Dispense Refill  ? buprenorphine (SUBUTEX) 8 MG SUBL SL tablet Place 1/2 tablet under tongue daily 15 tablet 0  ? cloNIDine (CATAPRES - DOSED IN MG/24 HR) 0.2 mg/24hr patch Apply 1 patch (0.2 mg total) onto the skin once a week. 12 patch 6  ? traMADol (ULTRAM) 50 MG tablet 1 (one) tablet by mouth four times daily, as needed 120 tablet 0  ? Aspirin-Caffeine (BC FAST PAIN RELIEF) 845-65 MG PACK Take 1 Package by mouth daily as needed (pain).    ? folic acid (FOLVITE) 1 MG tablet Take 1 tablet (1 mg total) by mouth daily. 30 tablet 0  ? gabapentin (NEURONTIN) 300 MG capsule Take 1 capsule (300 mg total) by mouth 3 (three) times daily. (Patient not taking: Reported on 01/04/2022) 90 capsule 1  ? ?No current facility-administered medications on file prior to visit.  ? ? ?Objective:   ? ?Vitals:  ? 01/04/22 0958 01/04/22 1012  ?BP: (!) 175/108 (!) 169/99  ?Pulse: (!) 58 (!) 54  ?Temp: 98 ?F (36.7 ?C)   ?TempSrc: Oral   ?SpO2: 100%   ?Weight: 136 lb 9.6 oz (62 kg)   ?Height: 5' 6"  (1.676 m)   ? ? ?Exam ?General appearance : Awake, alert, not in any distress. Speech Clear. Not toxic looking ?HEENT: Atraumatic and Normocephalic, pupils equally reactive to light and accomodation ?Neck: Supple, no JVD. No cervical lymphadenopathy.  ?Chest: Good air entry bilaterally, no added sounds  ?CVS: S1 S2 regular, no murmurs.  ?Abdomen: Bowel sounds present, Non tender and not distended with no gaurding, rigidity or rebound. ?Extremities: B/L Lower Ext shows no edema, both legs are warm to touch ?Neurology: Awake alert, and oriented X 3, CN II-XII intact, Non focal ?Skin: No Rash ? ?Data Review ?No results found for: HGBA1C ? ?Assessment & Plan  ?[name] was seen today for blood pressure check. ? ?Diagnoses and all orders for this visit: ? ?Essential hypertension ?Start reading ingredients particular SODIUM in food (labels) ?Previous appointment prescribed clonidine 0.2 mg weekly she has been trying to be compliant with this.  We will add valsartan/HCTZ 160/25 daily  ?Follow-up in 3 weeks for recheck and refer to clinical pharmacist to help with tighter control.  BP goal - <  130/80 ?Explained that having normal blood pressure is the goal and medications are helping to get to goal and maintain normal blood pressure. ?DIET: Limit salt intake, read nutrition labels to check salt content, limit fried and high fatty foods  ?Avoid using multisymptom OTC cold preparations that generally contain sudafed which can rise BP. Consult with pharmacist on best cold relief products to use for persons with HTN ?EXERCISE ?Discussed incorporating exercise such as walking - 30 minutes most days of the week and can do in 10 minute intervals    ? ?Other orders ?-     valsartan-hydrochlorothiazide (DIOVAN-HCT) 160-25 MG tablet; Take  1 tablet by mouth daily. ? ?Chronic kidney disease, unspecified CKD stage ?CMP added medication ?  ?There are no diagnoses linked to this encounter. ? ? ?Patient have been counseled extensively about nutrition and exercise. Other issues discussed during this visit include: low cholesterol diet, weight control and daily exercise, foot care, annual eye examinations at Ophthalmology, importance of adherence with medications and regular follow-up. We also discussed long term complications of uncontrolled diabetes and hypertension.  ? ?Return in about 3 weeks (around 02/01/2022) for Bp. ? ?The patient was given clear instructions to go to ER or return to medical center if symptoms don't improve, worsen or new problems develop. The patient verbalized understanding. The patient was told to call to get lab results if they haven't heard anything in the next week.  ? ?This note has been created with Surveyor, quantity. Any transcriptional errors are unintentional.  ? ?Kerin Perna, NP ?01/04/2022, 10:34 AM  ?

## 2022-01-04 NOTE — Addendum Note (Signed)
Addended by: Juluis Mire on: 01/04/2022 10:48 AM ? ? Modules accepted: Orders ? ?

## 2022-01-04 NOTE — Patient Instructions (Signed)
Hypertension, Adult ?Hypertension is another name for high blood pressure. High blood pressure forces your heart to work harder to pump blood. This can cause problems over time. ?There are two numbers in a blood pressure reading. There is a top number (systolic) over a bottom number (diastolic). It is best to have a blood pressure that is below 120/80. ?What are the causes? ?The cause of this condition is not known. Some other conditions can lead to high blood pressure. ?What increases the risk? ?Some lifestyle factors can make you more likely to develop high blood pressure: ?Smoking. ?Not getting enough exercise or physical activity. ?Being overweight. ?Having too much fat, sugar, calories, or salt (sodium) in your diet. ?Drinking too much alcohol. ?Other risk factors include: ?Having any of these conditions: ?Heart disease. ?Diabetes. ?High cholesterol. ?Kidney disease. ?Obstructive sleep apnea. ?Having a family history of high blood pressure and high cholesterol. ?Age. The risk increases with age. ?Stress. ?What are the signs or symptoms? ?High blood pressure may not cause symptoms. Very high blood pressure (hypertensive crisis) may cause: ?Headache. ?Fast or uneven heartbeats (palpitations). ?Shortness of breath. ?Nosebleed. ?Vomiting or feeling like you may vomit (nauseous). ?Changes in how you see. ?Very bad chest pain. ?Feeling dizzy. ?Seizures. ?How is this treated? ?This condition is treated by making healthy lifestyle changes, such as: ?Eating healthy foods. ?Exercising more. ?Drinking less alcohol. ?Your doctor may prescribe medicine if lifestyle changes do not help enough and if: ?Your top number is above 130. ?Your bottom number is above 80. ?Your personal target blood pressure may vary. ?Follow these instructions at home: ?Eating and drinking ? ?If told, follow the DASH eating plan. To follow this plan: ?Fill one half of your plate at each meal with fruits and vegetables. ?Fill one fourth of your plate  at each meal with whole grains. Whole grains include whole-wheat pasta, brown rice, and whole-grain bread. ?Eat or drink low-fat dairy products, such as skim milk or low-fat yogurt. ?Fill one fourth of your plate at each meal with low-fat (lean) proteins. Low-fat proteins include fish, chicken without skin, eggs, beans, and tofu. ?Avoid fatty meat, cured and processed meat, or chicken with skin. ?Avoid pre-made or processed food. ?Limit the amount of salt in your diet to less than 1,500 mg each day. ?Do not drink alcohol if: ?Your doctor tells you not to drink. ?You are pregnant, may be pregnant, or are planning to become pregnant. ?If you drink alcohol: ?Limit how much you have to: ?0-1 drink a day for women. ?0-2 drinks a day for men. ?Know how much alcohol is in your drink. In the [place] one drink equals one 12 oz bottle of beer (355 mL), one 5 oz glass of wine (148 mL), or one 1? oz glass of hard liquor (44 mL). ?Lifestyle ? ?Work with your doctor to stay at a healthy weight or to lose weight. Ask your doctor what the best weight is for you. ?Get at least 30 minutes of exercise that causes your heart to beat faster (aerobic exercise) most days of the week. This may include walking, swimming, or biking. ?Get at least 30 minutes of exercise that strengthens your muscles (resistance exercise) at least 3 days a week. This may include lifting weights or doing Pilates. ?Do not smoke or use any products that contain nicotine or tobacco. If you need help quitting, ask your doctor. ?Check your blood pressure at home as told by your doctor. ?Keep all follow-up visits. ?Medicines ?Take over-the-counter and prescription medicines  only as told by your doctor. Follow directions carefully. ?Do not skip doses of blood pressure medicine. The medicine does not work as well if you skip doses. Skipping doses also puts you at risk for problems. ?Ask your doctor about side effects or reactions to medicines that you should watch  for. ?Contact a doctor if: ?You think you are having a reaction to the medicine you are taking. ?You have headaches that keep coming back. ?You feel dizzy. ?You have swelling in your ankles. ?You have trouble with your vision. ?Get help right away if: ?You get a very bad headache. ?You start to feel mixed up (confused). ?You feel weak or numb. ?You feel faint. ?You have very bad pain in your: ?Chest. ?Belly (abdomen). ?You vomit more than once. ?You have trouble breathing. ?These symptoms may be an emergency. Get help right away. Call 911. ?Do not wait to see if the symptoms will go away. ?Do not drive yourself to the hospital. ?Summary ?Hypertension is another name for high blood pressure. ?High blood pressure forces your heart to work harder to pump blood. ?For most people, a normal blood pressure is less than 120/80. ?Making healthy choices can help lower blood pressure. If your blood pressure does not get lower with healthy choices, you may need to take medicine. ?This information is not intended to replace advice given to you by your health care provider. Make sure you discuss any questions you have with your health care provider. ?Document Revised: 05/27/2021 Document Reviewed: 05/27/2021 ?Elsevier Patient Education ? [employer]. ? ?

## 2022-01-05 LAB — CMP14+EGFR
ALT: 12 IU/L (ref 0–32)
AST: 23 IU/L (ref 0–40)
Albumin/Globulin Ratio: 1.6 (ref 1.2–2.2)
Albumin: 4.7 g/dL (ref 3.8–4.9)
Alkaline Phosphatase: 147 IU/L — ABNORMAL HIGH (ref 44–121)
BUN/Creatinine Ratio: 19 (ref 9–23)
BUN: 20 mg/dL (ref 6–24)
Bilirubin Total: 0.3 mg/dL (ref 0.0–1.2)
CO2: 20 mmol/L (ref 20–29)
Calcium: 9.6 mg/dL (ref 8.7–10.2)
Chloride: 102 mmol/L (ref 96–106)
Creatinine, Ser: 1.07 mg/dL — ABNORMAL HIGH (ref 0.57–1.00)
Globulin, Total: 3 g/dL (ref 1.5–4.5)
Glucose: 91 mg/dL (ref 70–99)
Potassium: 4.6 mmol/L (ref 3.5–5.2)
Sodium: 138 mmol/L (ref 134–144)
Total Protein: 7.7 g/dL (ref 6.0–8.5)
eGFR: 63 mL/min/{1.73_m2} (ref >59)

## 2022-01-10 ENCOUNTER — Other Ambulatory Visit: Payer: Self-pay

## 2022-01-21 ENCOUNTER — Other Ambulatory Visit: Payer: Self-pay

## 2022-01-24 ENCOUNTER — Other Ambulatory Visit: Payer: Self-pay

## 2022-01-25 ENCOUNTER — Other Ambulatory Visit: Payer: Self-pay

## 2022-01-26 ENCOUNTER — Other Ambulatory Visit: Payer: Self-pay

## 2022-01-26 MED ORDER — TRAMADOL HCL 50 MG PO TABS
ORAL_TABLET | ORAL | 0 refills | Status: DC
  Filled 2022-01-26: qty 120, 30d supply, fill #0

## 2022-01-26 MED ORDER — BUPRENORPHINE HCL 8 MG SL SUBL
SUBLINGUAL_TABLET | SUBLINGUAL | 0 refills | Status: DC
  Filled 2022-01-26: qty 15, 30d supply, fill #0

## 2022-01-31 ENCOUNTER — Ambulatory Visit (INDEPENDENT_AMBULATORY_CARE_PROVIDER_SITE_OTHER): Payer: Medicaid Other | Admitting: Primary Care

## 2022-02-07 ENCOUNTER — Telehealth: Payer: Self-pay | Admitting: *Deleted

## 2022-02-07 NOTE — Telephone Encounter (Signed)
Attempted to call x 3 message left with call back number to return call by 5 p.m. today to reschedule pre-visit if no return call than upcoming procedure on 03/07/22 will be cancelled.

## 2022-02-07 NOTE — Telephone Encounter (Signed)
No return call received procedure cancelled no show sent via my chart and mailed.

## 2022-02-08 ENCOUNTER — Encounter: Payer: Medicaid Other | Admitting: Gastroenterology

## 2022-02-08 ENCOUNTER — Ambulatory Visit: Payer: Medicaid Other | Admitting: Pharmacist

## 2022-02-17 ENCOUNTER — Other Ambulatory Visit: Payer: Self-pay

## 2022-02-17 MED ORDER — TRAMADOL HCL 50 MG PO TABS
ORAL_TABLET | ORAL | 0 refills | Status: DC
  Filled 2022-02-24: qty 120, 30d supply, fill #0

## 2022-02-17 MED ORDER — BUPRENORPHINE HCL 8 MG SL SUBL
SUBLINGUAL_TABLET | SUBLINGUAL | 0 refills | Status: DC
  Filled 2022-02-24: qty 15, 30d supply, fill #0

## 2022-02-24 ENCOUNTER — Other Ambulatory Visit: Payer: Self-pay

## 2022-02-25 ENCOUNTER — Other Ambulatory Visit: Payer: Self-pay

## 2022-02-28 ENCOUNTER — Other Ambulatory Visit: Payer: Self-pay

## 2022-03-01 ENCOUNTER — Other Ambulatory Visit: Payer: Self-pay

## 2022-03-07 ENCOUNTER — Encounter: Payer: Medicaid Other | Admitting: Internal Medicine

## 2022-03-10 ENCOUNTER — Ambulatory Visit (INDEPENDENT_AMBULATORY_CARE_PROVIDER_SITE_OTHER): Payer: Self-pay | Admitting: *Deleted

## 2022-03-10 NOTE — Telephone Encounter (Signed)
  Chief Complaint: urinary burning,blood in urine Symptoms: pain with urination, dark urine, pink when wipes Frequency: started yesterday Pertinent Negatives: Patient denies fever Disposition: [] ED /[] Urgent Care (no appt availability in office) / [] Appointment(In office/virtual)/ []  [facility]/ [] Home Care/ [x] Refused Recommended Disposition /[] [facility]/ []  Follow-up with PCP Additional Notes: No open appointment- advised UC/mobile unit- patient states no insurance or ride- office closed for lunch- message sent to see if patient can be scheduled virtually today.

## 2022-03-10 NOTE — Telephone Encounter (Signed)
Summary: Painful urination   Patient called in stating she has burning when urinating and she is barely able to go. She also states it is red when she urinates. Please assist patient further      Reason for Disposition  Pain or burning with passing urine  Answer Assessment - Initial Assessment Questions 1. COLOR of URINE: "Describe the color of the urine."  (e.g., tea-colored, pink, red, bloody) "Do you have blood clots in your urine?" (e.g., none, pea, grape, small coin)     Dark in color- pink when wipes 2. ONSET: "When did the bleeding start?"      Yesterday- dark for several days- burning yesterday  3. EPISODES: "How many times has there been blood in the urine?" or "How many times today?"     once 4. PAIN with URINATION: "Is there any pain with passing your urine?" If Yes, ask: "How bad is the pain?"  (Scale 1-10; or mild, moderate, severe)    - MILD: Complains slightly about urination hurting.    - MODERATE: Interferes with normal activities.      - SEVERE: Excruciating, unwilling or unable to urinate because of the pain.      Moderate/severe 5. FEVER: "Do you have a fever?" If Yes, ask: "What is your temperature, how was it measured, and when did it start?"     no 6. ASSOCIATED SYMPTOMS: "Are you passing urine more frequently than usual?"     Frequency/urgency yesterday 7. OTHER SYMPTOMS: "Do you have any other symptoms?" (e.g., back/flank pain, abdomen pain, vomiting)     no 8. PREGNANCY: "Is there any chance you are pregnant?" "When was your last menstrual period?"  Protocols used: Urine - Blood In-A-AH

## 2022-03-15 ENCOUNTER — Other Ambulatory Visit: Payer: Self-pay

## 2022-03-15 MED ORDER — CEPHALEXIN 500 MG PO CAPS
ORAL_CAPSULE | ORAL | 0 refills | Status: DC
  Filled 2022-03-15: qty 14, 7d supply, fill #0

## 2022-03-16 ENCOUNTER — Other Ambulatory Visit: Payer: Self-pay

## 2022-03-16 MED ORDER — TRAMADOL HCL 50 MG PO TABS
ORAL_TABLET | ORAL | 0 refills | Status: DC
  Filled 2022-03-16: qty 120, 30d supply, fill #0

## 2022-03-16 MED ORDER — BUPRENORPHINE HCL 8 MG SL SUBL
SUBLINGUAL_TABLET | SUBLINGUAL | 0 refills | Status: DC
  Filled 2022-03-16 – 2022-03-29 (×2): qty 15, 30d supply, fill #0

## 2022-03-17 ENCOUNTER — Other Ambulatory Visit: Payer: Self-pay

## 2022-03-29 ENCOUNTER — Other Ambulatory Visit: Payer: Self-pay

## 2022-03-30 ENCOUNTER — Other Ambulatory Visit: Payer: Self-pay

## 2022-04-01 ENCOUNTER — Other Ambulatory Visit: Payer: Self-pay

## 2022-04-13 ENCOUNTER — Other Ambulatory Visit: Payer: Self-pay

## 2022-04-13 MED ORDER — BUPRENORPHINE HCL 8 MG SL SUBL
SUBLINGUAL_TABLET | SUBLINGUAL | 0 refills | Status: DC
  Filled 2022-04-13: qty 15, 30d supply, fill #0

## 2022-04-14 ENCOUNTER — Other Ambulatory Visit: Payer: Self-pay

## 2022-05-04 ENCOUNTER — Other Ambulatory Visit: Payer: Self-pay

## 2022-05-04 MED ORDER — TRAMADOL HCL 50 MG PO TABS
50.0000 mg | ORAL_TABLET | Freq: Four times a day (QID) | ORAL | 0 refills | Status: DC | PRN
  Filled 2022-05-04: qty 120, 30d supply, fill #0

## 2022-05-04 MED ORDER — BUPRENORPHINE HCL 8 MG SL SUBL
4.0000 mg | SUBLINGUAL_TABLET | Freq: Every day | SUBLINGUAL | 0 refills | Status: DC
  Filled 2022-05-04: qty 15, 30d supply, fill #0

## 2022-05-05 ENCOUNTER — Other Ambulatory Visit: Payer: Self-pay

## 2022-05-06 ENCOUNTER — Other Ambulatory Visit: Payer: Self-pay

## 2022-06-02 ENCOUNTER — Other Ambulatory Visit: Payer: Self-pay

## 2022-06-02 MED ORDER — BUPRENORPHINE HCL 8 MG SL SUBL
4.0000 mg | SUBLINGUAL_TABLET | Freq: Every day | SUBLINGUAL | 0 refills | Status: DC
  Filled 2022-06-03: qty 15, 30d supply, fill #0

## 2022-06-03 ENCOUNTER — Other Ambulatory Visit: Payer: Self-pay

## 2022-06-06 ENCOUNTER — Other Ambulatory Visit: Payer: Self-pay

## 2022-06-07 ENCOUNTER — Other Ambulatory Visit: Payer: Self-pay

## 2022-06-13 ENCOUNTER — Other Ambulatory Visit: Payer: Self-pay

## 2022-06-23 ENCOUNTER — Other Ambulatory Visit: Payer: Self-pay

## 2022-07-04 ENCOUNTER — Other Ambulatory Visit: Payer: Self-pay

## 2022-07-04 MED ORDER — OMEPRAZOLE 40 MG PO CPDR
40.0000 mg | DELAYED_RELEASE_CAPSULE | Freq: Every day | ORAL | 0 refills | Status: DC
  Filled 2022-07-04: qty 30, 30d supply, fill #0

## 2022-07-04 MED ORDER — TRAMADOL HCL 50 MG PO TABS
50.0000 mg | ORAL_TABLET | Freq: Four times a day (QID) | ORAL | 0 refills | Status: DC | PRN
  Filled 2022-07-04: qty 120, 30d supply, fill #0

## 2022-07-05 ENCOUNTER — Other Ambulatory Visit: Payer: Self-pay

## 2022-07-05 MED ORDER — BUPRENORPHINE HCL 8 MG SL SUBL
SUBLINGUAL_TABLET | SUBLINGUAL | 0 refills | Status: DC
  Filled 2022-07-05: qty 15, 30d supply, fill #0

## 2022-07-13 ENCOUNTER — Other Ambulatory Visit: Payer: Self-pay

## 2022-07-13 MED ORDER — LISINOPRIL-HYDROCHLOROTHIAZIDE 20-25 MG PO TABS
1.0000 | ORAL_TABLET | Freq: Every day | ORAL | 1 refills | Status: DC
  Filled 2022-07-13 – 2022-07-22 (×2): qty 90, 90d supply, fill #0

## 2022-07-13 MED ORDER — BLOOD PRESSURE KIT KIT: PACK | 0 refills | Status: DC

## 2022-07-20 ENCOUNTER — Other Ambulatory Visit: Payer: Self-pay

## 2022-07-22 ENCOUNTER — Other Ambulatory Visit: Payer: Self-pay

## 2022-07-26 ENCOUNTER — Other Ambulatory Visit: Payer: Self-pay

## 2022-07-26 MED ORDER — POTASSIUM CHLORIDE ER 20 MEQ PO TBCR
1.0000 | EXTENDED_RELEASE_TABLET | Freq: Every day | ORAL | 0 refills | Status: DC
  Filled 2022-07-26: qty 7, 7d supply, fill #0

## 2022-08-03 ENCOUNTER — Other Ambulatory Visit: Payer: Self-pay

## 2022-08-03 MED ORDER — OMEPRAZOLE 40 MG PO CPDR
40.0000 mg | DELAYED_RELEASE_CAPSULE | Freq: Every day | ORAL | 0 refills | Status: DC
  Filled 2022-08-03: qty 30, 30d supply, fill #0

## 2022-08-03 MED ORDER — POTASSIUM CHLORIDE ER 10 MEQ PO TBCR
EXTENDED_RELEASE_TABLET | ORAL | 11 refills | Status: DC
  Filled 2022-08-03: qty 60, 30d supply, fill #0

## 2022-08-04 ENCOUNTER — Other Ambulatory Visit: Payer: Self-pay

## 2022-08-04 MED ORDER — BUPRENORPHINE HCL 8 MG SL SUBL
SUBLINGUAL_TABLET | SUBLINGUAL | 0 refills | Status: DC
  Filled 2022-08-04: qty 15, 30d supply, fill #0

## 2022-08-04 MED ORDER — TRAMADOL HCL 50 MG PO TABS
50.0000 mg | ORAL_TABLET | Freq: Four times a day (QID) | ORAL | 0 refills | Status: DC
  Filled 2022-08-04: qty 120, 30d supply, fill #0

## 2022-08-05 ENCOUNTER — Other Ambulatory Visit: Payer: Self-pay

## 2022-08-08 ENCOUNTER — Other Ambulatory Visit: Payer: Self-pay

## 2022-08-08 MED ORDER — LISINOPRIL-HYDROCHLOROTHIAZIDE 20-25 MG PO TABS
1.0000 | ORAL_TABLET | Freq: Every day | ORAL | 1 refills | Status: DC
  Filled 2022-08-08: qty 90, 90d supply, fill #0

## 2022-08-08 MED ORDER — ESOMEPRAZOLE MAGNESIUM 40 MG PO CPDR
40.0000 mg | DELAYED_RELEASE_CAPSULE | Freq: Every day | ORAL | 2 refills | Status: DC
  Filled 2022-08-08: qty 30, 30d supply, fill #0

## 2022-08-10 ENCOUNTER — Other Ambulatory Visit: Payer: Self-pay

## 2022-08-10 MED ORDER — GAVILYTE-C 240 G PO SOLR
4000.0000 mL | ORAL | 0 refills | Status: DC
  Filled 2022-08-10: qty 4000, 1d supply, fill #0

## 2022-08-11 ENCOUNTER — Other Ambulatory Visit: Payer: Self-pay

## 2022-08-16 ENCOUNTER — Emergency Department (HOSPITAL_COMMUNITY)
Admission: EM | Admit: 2022-08-16 | Discharge: 2022-08-17 | Disposition: A | Discharge status: Home / Self Care | Payer: Medicaid Other | Attending: Emergency Medicine | Admitting: Emergency Medicine

## 2022-08-16 ENCOUNTER — Encounter (HOSPITAL_COMMUNITY): Payer: Self-pay | Admitting: Emergency Medicine

## 2022-08-16 ENCOUNTER — Other Ambulatory Visit: Payer: Self-pay

## 2022-08-16 ENCOUNTER — Emergency Department (HOSPITAL_COMMUNITY): Payer: Medicaid Other

## 2022-08-16 DIAGNOSIS — I10 Essential (primary) hypertension: Secondary | ICD-10-CM | POA: Insufficient documentation

## 2022-08-16 DIAGNOSIS — R0789 Other chest pain: Secondary | ICD-10-CM | POA: Diagnosis not present

## 2022-08-16 DIAGNOSIS — L02511 Cutaneous abscess of right hand: Secondary | ICD-10-CM | POA: Diagnosis not present

## 2022-08-16 DIAGNOSIS — Z20822 Contact with and (suspected) exposure to covid-19: Secondary | ICD-10-CM | POA: Diagnosis not present

## 2022-08-16 DIAGNOSIS — R000 Tachycardia, unspecified: Secondary | ICD-10-CM | POA: Diagnosis not present

## 2022-08-16 DIAGNOSIS — M79644 Pain in right finger(s): Secondary | ICD-10-CM | POA: Diagnosis present

## 2022-08-16 DIAGNOSIS — F17200 Nicotine dependence, unspecified, uncomplicated: Secondary | ICD-10-CM | POA: Diagnosis not present

## 2022-08-16 DIAGNOSIS — L0291 Cutaneous abscess, unspecified: Secondary | ICD-10-CM

## 2022-08-16 DIAGNOSIS — Z79899 Other long term (current) drug therapy: Secondary | ICD-10-CM | POA: Insufficient documentation

## 2022-08-16 LAB — CBC WITH DIFFERENTIAL/PLATELET
Abs Immature Granulocytes: 0.02 10*3/uL (ref 0.00–0.07)
Basophils Absolute: 0 10*3/uL (ref 0.0–0.1)
Basophils Relative: 1 %
Eosinophils Absolute: 0 10*3/uL (ref 0.0–0.5)
Eosinophils Relative: 1 %
HCT: 41 % (ref 36.0–46.0)
Hemoglobin: 15 g/dL (ref 12.0–15.0)
Immature Granulocytes: 0 %
Lymphocytes Relative: 19 %
Lymphs Abs: 1.1 10*3/uL (ref 0.7–4.0)
MCH: 38.5 pg — ABNORMAL HIGH (ref 26.0–34.0)
MCHC: 36.6 g/dL — ABNORMAL HIGH (ref 30.0–36.0)
MCV: 105.1 fL — ABNORMAL HIGH (ref 80.0–100.0)
Monocytes Absolute: 0.4 10*3/uL (ref 0.1–1.0)
Monocytes Relative: 7 %
Neutro Abs: 4.2 10*3/uL (ref 1.7–7.7)
Neutrophils Relative %: 72 %
Platelets: 236 10*3/uL (ref 150–400)
RBC: 3.9 MIL/uL (ref 3.87–5.11)
RDW: 13 % (ref 11.5–15.5)
WBC: 5.8 10*3/uL (ref 4.0–10.5)
nRBC: 0 % (ref 0.0–0.2)

## 2022-08-16 MED ORDER — HYDROMORPHONE HCL 1 MG/ML IJ SOLN
0.5000 mg | Freq: Once | INTRAMUSCULAR | Status: AC
  Administered 2022-08-16: 0.5 mg via INTRAVENOUS
  Filled 2022-08-16: qty 1

## 2022-08-16 MED ORDER — ONDANSETRON HCL 4 MG/2ML IJ SOLN
4.0000 mg | Freq: Once | INTRAMUSCULAR | Status: AC
  Administered 2022-08-16: 4 mg via INTRAVENOUS
  Filled 2022-08-16: qty 2

## 2022-08-16 NOTE — ED Provider Triage Note (Signed)
Emergency Medicine Provider Triage Evaluation Note  [name] , a 51 y.o. female  was evaluated in triage.  Pt complains of generalized weakness.  She complains of paresthesia in her arms and face.  She states she has a history of hypocalcemia and hyponatremia.  She complains that anytime the blood pressure cuff squeezes her arm her arm drawls up.  She has also had 3 to 4 days of distal right index finger pain and swelling.  She has a history of CVA.  She is a daily smoker.  She woke up this morning with flulike symptoms and vomited several times..  Review of Systems  Positive: Weakness and paresthesia Negative: Fever  Physical Exam  BP (!) 170/119 (BP Location: Right Arm)   Pulse (!) 117   Resp 18   SpO2 100%  Gen:   Awake, no distress   Resp:  Normal effort  MSK:   Moves extremities without difficulty  Other:  Positive Trousseau sign  Medical Decision Making  Medically screening exam initiated at 9:44 PM.  Appropriate orders placed.  Chrisandra D Nangle was informed that the remainder of the evaluation will be completed by another provider, this initial triage assessment does not replace that evaluation, and the importance of remaining in the ED until their evaluation is complete.  Patient's symptoms concerning for severe metabolic derangement.   Margarita Mail, PA-C 08/16/22 [id number]

## 2022-08-16 NOTE — ED Triage Notes (Signed)
Pt reports chest pain, weakness, and numbness in both arms. Pt reports hx of low calcium.

## 2022-08-17 ENCOUNTER — Other Ambulatory Visit: Payer: Self-pay

## 2022-08-17 LAB — LACTIC ACID, PLASMA
Lactic Acid, Venous: 1.6 mmol/L (ref 0.5–1.9)
Lactic Acid, Venous: 2.5 mmol/L (ref 0.5–1.9)

## 2022-08-17 LAB — RESP PANEL BY RT-PCR (RSV, FLU A&B, COVID)  RVPGX2
Influenza A by PCR: NEGATIVE
Influenza B by PCR: NEGATIVE
Resp Syncytial Virus by PCR: NEGATIVE
SARS Coronavirus 2 by RT PCR: NEGATIVE

## 2022-08-17 LAB — COMPREHENSIVE METABOLIC PANEL
ALT: 39 U/L (ref 0–44)
AST: 69 U/L — ABNORMAL HIGH (ref 15–41)
Albumin: 3.7 g/dL (ref 3.5–5.0)
Alkaline Phosphatase: 170 U/L — ABNORMAL HIGH (ref 38–126)
Anion gap: 15 (ref 5–15)
BUN: 12 mg/dL (ref 6–20)
CO2: 25 mmol/L (ref 22–32)
Calcium: 9.2 mg/dL (ref 8.9–10.3)
Chloride: 96 mmol/L — ABNORMAL LOW (ref 98–111)
Creatinine, Ser: 1.29 mg/dL — ABNORMAL HIGH (ref 0.44–1.00)
GFR, Estimated: 50 mL/min — ABNORMAL LOW (ref >60)
Glucose, Bld: 133 mg/dL — ABNORMAL HIGH (ref 70–99)
Potassium: 3.2 mmol/L — ABNORMAL LOW (ref 3.5–5.1)
Sodium: 136 mmol/L (ref 135–145)
Total Bilirubin: 1.4 mg/dL — ABNORMAL HIGH (ref 0.3–1.2)
Total Protein: 7.1 g/dL (ref 6.5–8.1)

## 2022-08-17 LAB — LIPASE, BLOOD: Lipase: 71 U/L — ABNORMAL HIGH (ref 11–51)

## 2022-08-17 LAB — TROPONIN I (HIGH SENSITIVITY)
Troponin I (High Sensitivity): 13 ng/L (ref <18)
Troponin I (High Sensitivity): 14 ng/L (ref <18)

## 2022-08-17 MED ORDER — SODIUM CHLORIDE 0.9 % IV BOLUS
1000.0000 mL | Freq: Once | INTRAVENOUS | Status: AC
  Administered 2022-08-17: 1000 mL via INTRAVENOUS

## 2022-08-17 MED ORDER — ONDANSETRON 4 MG PO TBDP
4.0000 mg | ORAL_TABLET | Freq: Three times a day (TID) | ORAL | 0 refills | Status: DC | PRN
  Filled 2022-08-17: qty 20, 7d supply, fill #0

## 2022-08-17 MED ORDER — LIDOCAINE HCL (PF) 1 % IJ SOLN
5.0000 mL | Freq: Once | INTRAMUSCULAR | Status: AC
  Administered 2022-08-17: 5 mL
  Filled 2022-08-17: qty 30

## 2022-08-17 MED ORDER — DOXYCYCLINE HYCLATE 100 MG PO TABS
100.0000 mg | ORAL_TABLET | Freq: Two times a day (BID) | ORAL | 0 refills | Status: DC
  Filled 2022-08-17: qty 20, 10d supply, fill #0

## 2022-08-17 MED ORDER — HYDROMORPHONE HCL 1 MG/ML IJ SOLN
0.5000 mg | Freq: Once | INTRAMUSCULAR | Status: AC
  Administered 2022-08-17: 0.5 mg via INTRAVENOUS
  Filled 2022-08-17: qty 1

## 2022-08-17 MED ORDER — OXYCODONE-ACETAMINOPHEN 5-325 MG PO TABS
1.0000 | ORAL_TABLET | Freq: Three times a day (TID) | ORAL | 0 refills | Status: AC | PRN
  Filled 2022-08-17 (×2): qty 9, 3d supply, fill #0

## 2022-08-17 NOTE — ED Provider Notes (Signed)
[facility] DEPT Provider Note   CSN: [id number] Arrival date & time: 08/16/22  2033     History  Chief Complaint  Patient presents with   Chest Pain   Weakness    Laci D Woodyard is a 51 y.o. female.  HPI   Medical history including gout, chronic pain, Crohn's, alcohol dependency, presents to the emergency department with multiple complaints.  Patient states that she is here because she woke up yesterday with numbness on her arms bilaterally, she states it is on the bottom aspect, she states that this has progressed, she is now developed some chest pain this is on left-sided chest does not radiate, remains constant, she has no shortness of breath or pleuritic chest pain, position and or exertion does not make a difference, she guards as a pressure-like sensation, she is no lightheaded dizziness, she has no cardiac history no history of PEs or DVTs that she is not on hormone therapy, she does smoke and has hypertension as well as family history of MIs, she also notes that she is having right index finger pain, she states that about 3 days ago she noticed swelling around the DIP joint, has got more swollen more painful, she states it is a throbbing-like sensation, she denies any paresthesia or weakness, she is able to move but hurts when she does so.  States that she has had in the past even antibiotics and it went away.    Home Medications Prior to Admission medications   Medication Sig Start Date End Date Taking? Authorizing Provider  buprenorphine (SUBUTEX) 8 MG SUBL SL tablet Place 1/2 tablet under each tongue daily 08/03/22  Yes   cholecalciferol (VITAMIN D3) 25 MCG (1000 UNIT) tablet Take 1,000 Units by mouth daily.   Yes [provider]  doxycycline (VIBRAMYCIN) 100 MG capsule Take 1 capsule (100 mg total) by mouth 2 (two) times daily. 08/17/22  Yes Marcello Fennel, PA-C  lisinopril-hydrochlorothiazide (ZESTORETIC) 20-25 MG tablet Take 1  tablet by mouth daily. 08/08/22  Yes   omeprazole (PRILOSEC) 40 MG capsule Take 1 capsule (40 mg total) by mouth daily. 08/03/22  Yes   ondansetron (ZOFRAN-ODT) 4 MG disintegrating tablet Take 1 tablet (4 mg total) by mouth every 8 (eight) hours as needed for nausea or vomiting. 08/17/22  Yes Marcello Fennel, PA-C  oxyCODONE-acetaminophen (PERCOCET/ROXICET) 5-325 MG tablet Take 1 tablet by mouth every 8 (eight) hours as needed for up to 3 days for severe pain. 08/17/22 08/20/22 Yes Marcello Fennel, PA-C  potassium chloride (KLOR-CON) 10 MEQ tablet Take 2 tablets by mouth once a day . 08/03/22  Yes Claudia Desanctis, MD  traMADol (ULTRAM) 50 MG tablet Take 1 tablet (50 mg total) by mouth 4 (four) times daily as needed. Patient taking differently: Take 50 mg by mouth every 6 (six) hours as needed for moderate pain or severe pain. 08/03/22  Yes   Blood Pressure Monitoring (BLOOD PRESSURE KIT) KIT 1 (one) kit daily 07/13/22     buprenorphine (SUBUTEX) 8 MG SUBL SL tablet 1/2 each under tongue daily Patient not taking: Reported on 08/17/2022 04/13/22     buprenorphine (SUBUTEX) 8 MG SUBL SL tablet Place 0.5 tablets (4 mg total) under the tongue daily. Patient not taking: Reported on 08/17/2022 05/04/22     buprenorphine (SUBUTEX) 8 MG SUBL SL tablet Place 0.5 tablets (4 mg total) under the tongue daily. Patient not taking: Reported on 08/17/2022 06/01/22     buprenorphine (SUBUTEX) 8  MG SUBL SL tablet Place 0.5 tablets (4 mg total) under the tongue daily. Patient not taking: Reported on 08/17/2022 07/04/22     cephALEXin (KEFLEX) 500 MG capsule 1 (one) capsule by mouth two times daily, for UTI Patient not taking: Reported on 08/17/2022 03/15/22     cloNIDine (CATAPRES - DOSED IN MG/24 HR) 0.2 mg/24hr patch Apply 1 patch (0.2 mg total) onto the skin once a week. Patient not taking: Reported on 08/17/2022 10/21/21   Kerin Perna, NP  esomeprazole (NEXIUM) 40 MG capsule Take 1 capsule (40 mg  total) by mouth daily. Patient not taking: Reported on 04/54/0981 19/14/78     folic acid (FOLVITE) 1 MG tablet Take 1 tablet (1 mg total) by mouth daily. Patient not taking: Reported on 08/17/2022 04/15/21   Rai, Vernelle Emerald, MD  gabapentin (NEURONTIN) 300 MG capsule Take 1 capsule (300 mg total) by mouth 3 (three) times daily. Patient not taking: Reported on 01/04/2022 04/14/21   Rai, Vernelle Emerald, MD  lisinopril-hydrochlorothiazide (ZESTORETIC) 20-25 MG tablet Take 1 tablet by mouth daily. Patient not taking: Reported on 08/17/2022 07/13/22     polyethylene glycol (GAVILYTE-C) 240 g solution Use as directe per prep sheet. Patient not taking: Reported on 08/17/2022 08/10/22     Potassium Chloride ER 20 MEQ TBCR Take 1 tablet by mouth daily. Patient not taking: Reported on 08/17/2022 07/26/22     traMADol (ULTRAM) 50 MG tablet 1 (one) tablet by mouth four times daily, as needed Patient not taking: Reported on 08/17/2022 03/15/22     traMADol (ULTRAM) 50 MG tablet Take 1 tablet (50 mg total) by mouth 4 (four) times daily as needed. Patient not taking: Reported on 08/17/2022 05/04/22     traMADol (ULTRAM) 50 MG tablet Take 1 tablet (50 mg total) by mouth 4 (four) times daily as needed. Patient not taking: Reported on 08/17/2022 07/04/22     valsartan-hydrochlorothiazide (DIOVAN-HCT) 160-25 MG tablet Take 1 tablet by mouth daily. Patient not taking: Reported on 08/17/2022 01/04/22   Kerin Perna, NP      Allergies    Penicillins    Review of Systems   Review of Systems  Constitutional:  Negative for chills and fever.  Respiratory:  Negative for shortness of breath.   Cardiovascular:  Positive for chest pain.  Gastrointestinal:  Negative for abdominal pain.  Neurological:  Negative for headaches.    Physical Exam Updated Vital Signs BP (!) 170/119 (BP Location: Right Arm)   Pulse (!) 117   Temp 98.6 F (37 C)   Resp 18   SpO2 100%  Physical Exam Vitals and nursing note reviewed.   Constitutional:      General: She is not in acute distress.    Appearance: She is not ill-appearing.  HENT:     Head: Normocephalic and atraumatic.     Nose: No congestion.  Eyes:     Conjunctiva/sclera: Conjunctivae normal.  Cardiovascular:     Rate and Rhythm: Regular rhythm. Tachycardia present.     Pulses: Normal pulses.     Heart sounds: No murmur heard.    No friction rub. No gallop.  Pulmonary:     Effort: No respiratory distress.     Breath sounds: No wheezing, rhonchi or rales.  Musculoskeletal:     Right lower leg: No edema.     Left lower leg: No edema.     Comments: Focused exam of the left hand reveals erythema as well as edema at the right DIP  joint of the right index finger, she is able to move at her MCP PIP and minimal movement at the DIP secondary to pain, area is warm to the touch, fluctuance present, she has 2-second capillary refill, sensation tact to light touch.  There is no open wounds or signs of abrasions please see picture for full detail  Skin:    General: Skin is warm and dry.  Neurological:     Mental Status: She is alert.     GCS: GCS eye subscore is 4. GCS verbal subscore is 5. GCS motor subscore is 6.     Cranial Nerves: Cranial nerves 2-12 are intact.     Sensory: Sensation is intact.     Motor: No weakness.     Coordination: Romberg sign negative. Finger-Nose-Finger Test normal.     Comments: Cranial nerves II through XII grossly intact no difficulty with word finding following two-step commands there is no yellowness present.  Psychiatric:        Mood and Affect: Mood normal.          ED Results / Procedures / Treatments   Labs (all labs ordered are listed, but only abnormal results are displayed) Labs Reviewed  CBC WITH DIFFERENTIAL/PLATELET - Abnormal; Notable for the following components:      Result Value   MCV 105.1 (*)    MCH 38.5 (*)    MCHC 36.6 (*)    All other components within normal limits  COMPREHENSIVE METABOLIC  PANEL - Abnormal; Notable for the following components:   Potassium 3.2 (*)    Chloride 96 (*)    Glucose, Bld 133 (*)    Creatinine, Ser 1.29 (*)    AST 69 (*)    Alkaline Phosphatase 170 (*)    Total Bilirubin 1.4 (*)    GFR, Estimated 50 (*)    All other components within normal limits  LIPASE, BLOOD - Abnormal; Notable for the following components:   Lipase 71 (*)    All other components within normal limits  LACTIC ACID, PLASMA - Abnormal; Notable for the following components:   Lactic Acid, Venous 2.5 (*)    All other components within normal limits  RESP PANEL BY RT-PCR (RSV, FLU A&B, COVID)  RVPGX2  CULTURE, BLOOD (ROUTINE X 2)  CULTURE, BLOOD (ROUTINE X 2)  LACTIC ACID, PLASMA  TROPONIN I (HIGH SENSITIVITY)  TROPONIN I (HIGH SENSITIVITY)    EKG EKG Interpretation  Date/Time:  Tuesday August 16 2022 21:34:50 EST Ventricular Rate:  100 PR Interval:  131 QRS Duration: 73 QT Interval:  380 QTC Calculation: 491 R Axis:   83 Text Interpretation: Sinus tachycardia Probable anterior infarct, age indeterminate No significant change since last tracing Confirmed by Deno Etienne [phone]) on 08/16/2022 10:30:33 PM  Radiology DG Finger Index Right  Result Date: 08/16/2022 CLINICAL DATA:  Pain, palpable abnormality on right index finger EXAM: RIGHT INDEX FINGER 2+V COMPARISON:  05/15/2021 FINDINGS: Frontal, oblique, and lateral views of the right index finger are obtained. There are no acute displaced fractures. Marked joint space narrowing of the second distal interphalangeal joint is again noted, with slight progression since prior study. Subtle erosive changes are seen along the radial margin of the second D IP, again progressive since prior study. There is marked soft tissue swelling surrounding the second distal interphalangeal joint, greatest dorsally. Remaining visualized portions of the right second digit are unremarkable. IMPRESSION: 1. Marked soft tissue swelling  surrounding the second D IP, with progressive joint space narrowing  and periarticular erosive change. Findings are concerning for infection and underlying osteomyelitis, with inflammatory or crystalline arthropathy felt to be far less likely. Electronically Signed   By: Randa Ngo M.D.   On: 08/16/2022 22:18    Procedures .Marland KitchenIncision and Drainage  Date/Time: 08/17/2022 3:12 AM  Performed by: Marcello Fennel, PA-C Authorized by: Marcello Fennel, PA-C   Consent:    Consent obtained:  Verbal   Consent given by:  Patient   Risks discussed:  Bleeding, incomplete drainage, pain, damage to other organs and infection   Alternatives discussed:  No treatment Universal protocol:    Patient identity confirmed:  Verbally with patient Location:    Type:  Abscess   Size:  2cm   Location:  Upper extremity   Upper extremity location:  Finger   Finger location:  R index finger Pre-procedure details:    Skin preparation:  Antiseptic wash Sedation:    Sedation type:  None Anesthesia:    Anesthesia method:  Nerve block   Block needle gauge:  24 G   Block anesthetic:  Lidocaine 1% w/o epi   Block injection procedure:  Anatomic landmarks identified   Block outcome:  Anesthesia achieved Procedure type:    Complexity:  Simple Procedure details:    Incision types:  Stab incision   Incision depth:  Subcutaneous   Drainage:  Bloody and purulent   Drainage amount:  Moderate   Wound treatment:  Wound left open   Packing materials:  None Post-procedure details:    Procedure completion:  Tolerated well, no immediate complications     Medications Ordered in ED Medications  HYDROmorphone (DILAUDID) injection 0.5 mg (0.5 mg Intravenous Given 08/16/22 2325)  ondansetron (ZOFRAN) injection 4 mg (4 mg Intravenous Given 08/16/22 2334)  sodium chloride 0.9 % bolus 1,000 mL (0 mLs Intravenous Stopped 08/17/22 0354)  lidocaine (PF) (XYLOCAINE) 1 % injection 5 mL (5 mLs Infiltration Given by Other  08/17/22 0330)  HYDROmorphone (DILAUDID) injection 0.5 mg (0.5 mg Intravenous Given 08/17/22 0330)    ED Course/ Medical Decision Making/ A&P                           Medical Decision Making Amount and/or Complexity of Data Reviewed Labs: ordered.  Risk Prescription drug management.   This patient presents to the ED for concern of multiple complaints, this involves an extensive number of treatment options, and is a complaint that carries with it a high risk of complications and morbidity.  The differential diagnosis includes ACE yes, PE, pneumonia, metabolic derailment, CVA, cellulitis, septic arthritis    Additional history obtained:  Additional history obtained from partner at bedside External records from outside source obtained and reviewed including neurology notes   Co morbidities that complicate the patient evaluation  CVA  Social Determinants of Health:  N/A    Lab Tests:  I Ordered, and personally interpreted labs.  The pertinent results include: CBC unremarkable, CMP shows potassium 3.2, glucose 133, creatinine 1.2, alk phos 170 T. bili 1.4 GFR 50 lipase 71 lactic 2.5   Imaging Studies ordered:  I ordered imaging studies including x-ray of the right finger I independently visualized and interpreted imaging which showed x-ray reveals marked swelling joint space narrowing concern for possible infection with underlying osteomyelitis with inflammatory or crystalline arthritic past. I agree with the radiologist interpretation   Cardiac Monitoring:  The patient was maintained on a cardiac monitor.  I personally viewed and interpreted  the cardiac monitored which showed an underlying rhythm of: Sinus tach without signs of ischemia   Medicines ordered and prescription drug management:  I ordered medication including the pain medication fluids I have reviewed the patients home medicines and have made adjustments as needed  Critical  Interventions:  N/a   Reevaluation:  Presents with multiple complaints, triage obtain basic lab work as well as imaging, I reviewed imaging was concern for possible osteomyelitis, on my exam she has infectious looking joint, will add on a lactic, as well as troponin due to her chest pain concern for possible sepsis presentation  Patient is a lactic of 2.5, will provide with fluids, will also consult with hand for further management of the right index finger.  I&D was performed, large amount of purulent discharge was expelled from the wound, I attempted to pack it but patient would not tolerate, will leave open, will repeat lactic and second troponin.  Second troponin and second lactic are unremarkable, vital signs are reassuring, she is hemodynamically stable, she agreement with discharge at this time  Consultations Obtained:  I requested consultation with the orthopedics Dr. Jon Gills,  and discussed lab and imaging findings as well as pertinent plan - they recommend: If fluctuance present I&D, start antibiotics, discharge with close follow-up at his office on Thursday.    Test Considered:  N/a    Rule out Suspicion for septic arthritis is lower at this time as presentation is atypical, does not normally present with fluctuance nor should you obtain large amount.  Discharge, it is possible this might invaded the joint capsule but my suspicion is lower as edema is distal to the joint space she is able to flex and extend.  I doubt osteomyelitis presentation is extremely atypical to develop purulent discharge, again it is possible this might had advanced into the bone but I find this less likely especially since patient is afebrile nontachycardic no leukocytosis.  She did have initial elevated lactic but patient she has been vomiting over the last couple days she is chronic for her suspect this is more dehydration after adequate fluid resuscitation heart rate as well as lactic has resolved.   Low suspicion for fracture or dislocation as x-ray does not feel any significant findings. low suspicion for ligament or tendon damage as area was palpated no gross defects noted, they had full range of motion as well as 5/5 strength.  Low suspicion for compartment syndrome as area was palpated it was soft to the touch, neurovascular fully intact.  ACS is low she has a negative delta troponin EKG without signs of ischemia she has low risk factors.  I doubt CVA or intracranial head bleed not on anticoag's no focal deficits.     Dispostion and problem list  After consideration of the diagnostic results and the patients response to treatment, I feel that the patent would benefit from discharge.   Abscess-area was drained, will start on antibiotics, patient has prompt follow-up with hand surgery on Thursday.            Final Clinical Impression(s) / ED Diagnoses Final diagnoses:  Abscess    Rx / DC Orders ED Discharge Orders          Ordered    doxycycline (VIBRAMYCIN) 100 MG capsule  2 times daily        08/17/22 0444    ondansetron (ZOFRAN-ODT) 4 MG disintegrating tablet  Every 8 hours PRN        08/17/22 0444  oxyCODONE-acetaminophen (PERCOCET/ROXICET) 5-325 MG tablet  Every 8 hours PRN        08/17/22 0444              Marcello Fennel, PA-C 08/17/22 0445    Maudie Flakes, MD 08/17/22 [phone]

## 2022-08-17 NOTE — Discharge Instructions (Signed)
I drained an abscess on your finger, of Sarahn antibiotics please take as prescribed.  I have given you Zofran this will help with nausea I recommend taking the nausea medication first and then taking your antibiotics as then your pain medication.  Do not take you for morphine while taking oxycodone.  I have given you a short course of narcotics please take as prescribed.  This medication can make you drowsy do not consume alcohol or operate heavy machinery when taking this medication.  This medication is Tylenol in it do not take Tylenol and take this medication.     Please call hand surgery today for follow-up on Thursday  Come back to the emergency department if you develop chest pain, shortness of breath, severe abdominal pain, uncontrolled nausea, vomiting, diarrhea.

## 2022-08-22 LAB — CULTURE, BLOOD (ROUTINE X 2)
Culture: NO GROWTH
Special Requests: ADEQUATE

## 2022-08-23 ENCOUNTER — Telehealth (HOSPITAL_BASED_OUTPATIENT_CLINIC_OR_DEPARTMENT_OTHER): Payer: Self-pay | Admitting: Emergency Medicine

## 2022-08-23 ENCOUNTER — Other Ambulatory Visit: Payer: Self-pay

## 2022-08-23 ENCOUNTER — Emergency Department (HOSPITAL_COMMUNITY)
Admission: EM | Admit: 2022-08-23 | Discharge: 2022-08-24 | Disposition: A | Discharge status: Home / Self Care | Payer: Medicaid Other | Attending: Emergency Medicine | Admitting: Emergency Medicine

## 2022-08-23 DIAGNOSIS — N189 Chronic kidney disease, unspecified: Secondary | ICD-10-CM | POA: Diagnosis not present

## 2022-08-23 DIAGNOSIS — R109 Unspecified abdominal pain: Secondary | ICD-10-CM | POA: Diagnosis not present

## 2022-08-23 DIAGNOSIS — Z79899 Other long term (current) drug therapy: Secondary | ICD-10-CM | POA: Insufficient documentation

## 2022-08-23 DIAGNOSIS — E876 Hypokalemia: Secondary | ICD-10-CM | POA: Insufficient documentation

## 2022-08-23 DIAGNOSIS — R1110 Vomiting, unspecified: Secondary | ICD-10-CM | POA: Insufficient documentation

## 2022-08-23 LAB — BASIC METABOLIC PANEL
Anion gap: 17 — ABNORMAL HIGH (ref 5–15)
BUN: 16 mg/dL (ref 6–20)
CO2: 27 mmol/L (ref 22–32)
Calcium: 9.3 mg/dL (ref 8.9–10.3)
Chloride: 92 mmol/L — ABNORMAL LOW (ref 98–111)
Creatinine, Ser: 0.89 mg/dL (ref 0.44–1.00)
GFR, Estimated: >60 mL/min (ref >60)
Glucose, Bld: 116 mg/dL — ABNORMAL HIGH (ref 70–99)
Potassium: 3.1 mmol/L — ABNORMAL LOW (ref 3.5–5.1)
Sodium: 136 mmol/L (ref 135–145)

## 2022-08-23 LAB — I-STAT BETA HCG BLOOD, ED (MC, WL, AP ONLY): I-stat hCG, quantitative: <5 m[IU]/mL (ref <5)

## 2022-08-23 MED ORDER — ONDANSETRON 4 MG PO TBDP
4.0000 mg | ORAL_TABLET | Freq: Once | ORAL | Status: AC | PRN
  Administered 2022-08-24: 4 mg via ORAL
  Filled 2022-08-23: qty 1

## 2022-08-23 NOTE — Telephone Encounter (Signed)
Post ED Visit - Positive Culture Follow-up  Culture report reviewed by antimicrobial stewardship pharmacist: [facility] Team []  Elenor Quinones, Pharm.D. []  Heide Guile, Pharm.D., BCPS AQ-ID []  Parks Neptune, Pharm.D., BCPS []  Alycia Rossetti, Pharm.D., BCPS []  [place], [place].D., BCPS, AAHIVP []  Legrand Como, Pharm.D., BCPS, AAHIVP []  Salome Arnt, PharmD, BCPS []  Johnnette Gourd, PharmD, BCPS []  Hughes Better, PharmD, BCPS []  Leeroy Cha, PharmD []  Laqueta Linden, PharmD, BCPS []  Albertina Parr, PharmD  [facility] Team [x]  Leodis Sias, PharmD []  Lindell Spar, PharmD []  Royetta Asal, PharmD []  Graylin Shiver, Rph []  Rema Fendt) Glennon Mac, PharmD []  Arlyn Dunning, PharmD []  Netta Cedars, PharmD []  Dia Sitter, PharmD []  Leone Haven, PharmD []  Gretta Arab, PharmD []  Theodis Shove, PharmD []  Peggyann Juba, PharmD []  Reuel Boom, PharmD   Positive blood culture Treated with doxycycline, organism sensitive to the same and no further patient follow-up is required at this time.  Hazle Nordmann 08/23/2022, 10:38 AM

## 2022-08-23 NOTE — ED Triage Notes (Signed)
Per EMS- patient c/o left flank pain and vomiting x 2 days. Patient states pain is worse with movement. Patient has a history of kidney stones.

## 2022-08-23 NOTE — ED Notes (Signed)
Patient reports she has had fevers at home. She has not been able to take her medications due to having n/v

## 2022-08-24 ENCOUNTER — Emergency Department (HOSPITAL_COMMUNITY): Payer: Medicaid Other

## 2022-08-24 LAB — CBC WITH DIFFERENTIAL/PLATELET
Abs Immature Granulocytes: 0.03 10*3/uL (ref 0.00–0.07)
Basophils Absolute: 0 10*3/uL (ref 0.0–0.1)
Basophils Relative: 0 %
Eosinophils Absolute: 0 10*3/uL (ref 0.0–0.5)
Eosinophils Relative: 0 %
HCT: 36.7 % (ref 36.0–46.0)
Hemoglobin: 13 g/dL (ref 12.0–15.0)
Immature Granulocytes: 0 %
Lymphocytes Relative: 22 %
Lymphs Abs: 1.6 10*3/uL (ref 0.7–4.0)
MCH: 36.7 pg — ABNORMAL HIGH (ref 26.0–34.0)
MCHC: 35.4 g/dL (ref 30.0–36.0)
MCV: 103.7 fL — ABNORMAL HIGH (ref 80.0–100.0)
Monocytes Absolute: 0.7 10*3/uL (ref 0.1–1.0)
Monocytes Relative: 9 %
Neutro Abs: 5 10*3/uL (ref 1.7–7.7)
Neutrophils Relative %: 69 %
Platelets: 221 10*3/uL (ref 150–400)
RBC: 3.54 MIL/uL — ABNORMAL LOW (ref 3.87–5.11)
RDW: 12.8 % (ref 11.5–15.5)
WBC: 7.3 10*3/uL (ref 4.0–10.5)
nRBC: 0 % (ref 0.0–0.2)

## 2022-08-24 LAB — URINALYSIS, ROUTINE W REFLEX MICROSCOPIC
Bilirubin Urine: NEGATIVE
Glucose, UA: NEGATIVE mg/dL
Ketones, ur: NEGATIVE mg/dL
Nitrite: NEGATIVE
Protein, ur: 30 mg/dL — AB
Specific Gravity, Urine: 1.019 (ref 1.005–1.030)
pH: 5 (ref 5.0–8.0)

## 2022-08-24 LAB — HEPATIC FUNCTION PANEL
ALT: 20 U/L (ref 0–44)
AST: 33 U/L (ref 15–41)
Albumin: 3 g/dL — ABNORMAL LOW (ref 3.5–5.0)
Alkaline Phosphatase: 145 U/L — ABNORMAL HIGH (ref 38–126)
Bilirubin, Direct: 0.4 mg/dL — ABNORMAL HIGH (ref 0.0–0.2)
Indirect Bilirubin: 0.9 mg/dL (ref 0.3–0.9)
Total Bilirubin: 1.3 mg/dL — ABNORMAL HIGH (ref 0.3–1.2)
Total Protein: 6.6 g/dL (ref 6.5–8.1)

## 2022-08-24 LAB — LIPASE, BLOOD: Lipase: 69 U/L — ABNORMAL HIGH (ref 11–51)

## 2022-08-24 MED ORDER — POTASSIUM CHLORIDE CRYS ER 20 MEQ PO TBCR
40.0000 meq | EXTENDED_RELEASE_TABLET | Freq: Once | ORAL | Status: DC
  Filled 2022-08-24: qty 2

## 2022-08-24 MED ORDER — LACTATED RINGERS IV BOLUS
1000.0000 mL | Freq: Once | INTRAVENOUS | Status: AC
  Administered 2022-08-24: 1000 mL via INTRAVENOUS

## 2022-08-24 MED ORDER — ACETAMINOPHEN 500 MG PO TABS
1000.0000 mg | ORAL_TABLET | Freq: Once | ORAL | Status: AC
  Administered 2022-08-24: 1000 mg via ORAL
  Filled 2022-08-24: qty 2

## 2022-08-24 NOTE — Discharge Instructions (Addendum)
You were seen in the ER today for your flank pain.  Your workup was reassuring.  Please follow-up with the clinics to below in 1 week to recheck your urine.  Increase your hydration with water over the neck several days and follow-up with your primary care doctor.  Return to the ER with any new severe symptoms.

## 2022-08-24 NOTE — ED Provider Notes (Signed)
[facility]-EMERGENCY DEPT Provider Note   CSN: [id number] Arrival date & time: 08/23/22  1221     History  Chief Complaint  Patient presents with   Flank Pain   Emesis    [name] is a 52 y.o. female with a history of kidney disease and kidney stones who presents with 2 days of left flank pain radiating down the left lower abdomen with associated nausea when the pain is most severe.  NBNB emesis.  Decreased urine output, dark in color.  Some cloudy urine yesterday.  No fevers or chills I have personally reviewed her medical records.  She is history of Crohn's, hypertension, chronic pain syndrome, CKD.  She is not anticoagulated.  HPI     Home Medications Prior to Admission medications   Medication Sig Start Date End Date Taking? Authorizing Provider  Blood Pressure Monitoring (BLOOD PRESSURE KIT) KIT 1 (one) kit daily 07/13/22     buprenorphine (SUBUTEX) 8 MG SUBL SL tablet 1/2 each under tongue daily Patient not taking: Reported on 08/17/2022 04/13/22     buprenorphine (SUBUTEX) 8 MG SUBL SL tablet Place 0.5 tablets (4 mg total) under the tongue daily. Patient not taking: Reported on 08/17/2022 05/04/22     buprenorphine (SUBUTEX) 8 MG SUBL SL tablet Place 0.5 tablets (4 mg total) under the tongue daily. Patient not taking: Reported on 08/17/2022 06/01/22     buprenorphine (SUBUTEX) 8 MG SUBL SL tablet Place 0.5 tablets (4 mg total) under the tongue daily. Patient not taking: Reported on 08/17/2022 07/04/22     buprenorphine (SUBUTEX) 8 MG SUBL SL tablet Place 1/2 tablet under each tongue daily 08/03/22     cephALEXin (KEFLEX) 500 MG capsule 1 (one) capsule by mouth two times daily, for UTI Patient not taking: Reported on 08/17/2022 03/15/22     cholecalciferol (VITAMIN D3) 25 MCG (1000 UNIT) tablet Take 1,000 Units by mouth daily.    [provider]  cloNIDine (CATAPRES - DOSED IN MG/24 HR) 0.2 mg/24hr patch Apply 1 patch (0.2 mg total) onto  the skin once a week. Patient not taking: Reported on 08/17/2022 10/21/21   Grayce Sessions, NP  doxycycline (VIBRA-TABS) 100 MG tablet Take 1 tablet (100 mg total) by mouth 2 (two) times daily. 08/17/22   Carroll Sage, PA-C  esomeprazole (NEXIUM) 40 MG capsule Take 1 capsule (40 mg total) by mouth daily. Patient not taking: Reported on 08/17/2022 08/08/22     folic acid (FOLVITE) 1 MG tablet Take 1 tablet (1 mg total) by mouth daily. Patient not taking: Reported on 08/17/2022 04/15/21   Rai, Delene Ruffini, MD  gabapentin (NEURONTIN) 300 MG capsule Take 1 capsule (300 mg total) by mouth 3 (three) times daily. Patient not taking: Reported on 01/04/2022 04/14/21   Rai, Delene Ruffini, MD  lisinopril-hydrochlorothiazide (ZESTORETIC) 20-25 MG tablet Take 1 tablet by mouth daily. Patient not taking: Reported on 08/17/2022 07/13/22     lisinopril-hydrochlorothiazide (ZESTORETIC) 20-25 MG tablet Take 1 tablet by mouth daily. 08/08/22     omeprazole (PRILOSEC) 40 MG capsule Take 1 capsule (40 mg total) by mouth daily. 08/03/22     ondansetron (ZOFRAN-ODT) 4 MG disintegrating tablet Dissolve 1 tablet (4 mg total) by mouth every 8 (eight) hours as needed for nausea or vomiting. 08/17/22   Carroll Sage, PA-C  polyethylene glycol (GAVILYTE-C) 240 g solution Use as directe per prep sheet. Patient not taking: Reported on 08/17/2022 08/10/22     potassium chloride (KLOR-CON) 10  MEQ tablet Take 2 tablets by mouth once a day . 08/03/22   Estanislado Emms, MD  Potassium Chloride ER 20 MEQ TBCR Take 1 tablet by mouth daily. Patient not taking: Reported on 08/17/2022 07/26/22     traMADol (ULTRAM) 50 MG tablet 1 (one) tablet by mouth four times daily, as needed Patient not taking: Reported on 08/17/2022 03/15/22     traMADol (ULTRAM) 50 MG tablet Take 1 tablet (50 mg total) by mouth 4 (four) times daily as needed. Patient not taking: Reported on 08/17/2022 05/04/22     traMADol (ULTRAM) 50 MG tablet Take 1  tablet (50 mg total) by mouth 4 (four) times daily as needed. Patient not taking: Reported on 08/17/2022 07/04/22     traMADol (ULTRAM) 50 MG tablet Take 1 tablet (50 mg total) by mouth 4 (four) times daily as needed. Patient taking differently: Take 50 mg by mouth every 6 (six) hours as needed for moderate pain or severe pain. 08/03/22     valsartan-hydrochlorothiazide (DIOVAN-HCT) 160-25 MG tablet Take 1 tablet by mouth daily. Patient not taking: Reported on 08/17/2022 01/04/22   Grayce Sessions, NP      Allergies    Penicillins    Review of Systems   Review of Systems  Gastrointestinal:  Positive for abdominal pain, nausea and vomiting.  Genitourinary:  Positive for decreased urine volume and flank pain. Negative for dysuria, frequency and hematuria.    Physical Exam Updated Vital Signs BP (!) 149/97   Pulse 86   Temp 98.7 F (37.1 C) (Oral)   Resp 16   SpO2 97%  Physical Exam Vitals and nursing note reviewed.  Constitutional:      Appearance: She is not ill-appearing or toxic-appearing.  HENT:     Head: Normocephalic and atraumatic.     Mouth/Throat:     Mouth: Mucous membranes are moist.     Pharynx: No oropharyngeal exudate or posterior oropharyngeal erythema.  Eyes:     General:        Right eye: No discharge.        Left eye: No discharge.     Extraocular Movements: Extraocular movements intact.     Conjunctiva/sclera: Conjunctivae normal.     Pupils: Pupils are equal, round, and reactive to light.  Cardiovascular:     Rate and Rhythm: Normal rate and regular rhythm.     Pulses: Normal pulses.     Heart sounds: Normal heart sounds. No murmur heard.    No gallop.  Pulmonary:     Effort: Pulmonary effort is normal. No respiratory distress.     Breath sounds: Normal breath sounds. No wheezing or rales.  Abdominal:     General: Bowel sounds are normal. There is no distension.     Palpations: Abdomen is soft.     Tenderness: There is abdominal tenderness in  the left lower quadrant. There is left CVA tenderness. There is no right CVA tenderness, guarding or rebound.  Musculoskeletal:        General: No deformity.     Cervical back: Neck supple.     Right lower leg: No edema.     Left lower leg: No edema.  Skin:    General: Skin is warm and dry.     Capillary Refill: Capillary refill takes less than 2 seconds.     Findings: No rash.  Neurological:     General: No focal deficit present.     Mental Status: She is alert and oriented  to person, place, and time. Mental status is at baseline.  Psychiatric:        Mood and Affect: Mood normal.     ED Results / Procedures / Treatments   Labs (all labs ordered are listed, but only abnormal results are displayed) Labs Reviewed  URINALYSIS, ROUTINE W REFLEX MICROSCOPIC - Abnormal; Notable for the following components:      Result Value   Color, Urine AMBER (*)    Hgb urine dipstick SMALL (*)    Protein, ur 30 (*)    Leukocytes,Ua SMALL (*)    Bacteria, UA FEW (*)    All other components within normal limits  BASIC METABOLIC PANEL - Abnormal; Notable for the following components:   Potassium 3.1 (*)    Chloride 92 (*)    Glucose, Bld 116 (*)    Anion gap 17 (*)    All other components within normal limits  CBC WITH DIFFERENTIAL/PLATELET - Abnormal; Notable for the following components:   RBC 3.54 (*)    MCV 103.7 (*)    MCH 36.7 (*)    All other components within normal limits  HEPATIC FUNCTION PANEL - Abnormal; Notable for the following components:   Albumin 3.0 (*)    Alkaline Phosphatase 145 (*)    Total Bilirubin 1.3 (*)    Bilirubin, Direct 0.4 (*)    All other components within normal limits  LIPASE, BLOOD - Abnormal; Notable for the following components:   Lipase 69 (*)    All other components within normal limits  URINE CULTURE  I-STAT BETA HCG BLOOD, ED ([facility], [facility], [facility] ONLY)    EKG None  Radiology CT Renal Stone Study  Result Date: 08/24/2022 CLINICAL DATA:  Left flank  pain and vomiting for 2 days. History of kidney stones. EXAM: CT ABDOMEN AND PELVIS WITHOUT CONTRAST TECHNIQUE: Multidetector CT imaging of the abdomen and pelvis was performed following the standard protocol without IV contrast. RADIATION DOSE REDUCTION: This exam was performed according to the departmental dose-optimization program which includes automated exposure control, adjustment of the mA and/or kV according to patient size and/or use of iterative reconstruction technique. COMPARISON:  04/09/2021. FINDINGS: Lower chest: Stable atelectasis or scarring at the left lung base. Hepatobiliary: There is fatty infiltration of the liver with focal fatty infiltration in the left lobe of the liver adjacent to the falciform ligament. No biliary ductal dilatation. The gallbladder is without stones. Pancreas: Unremarkable. No pancreatic ductal dilatation or surrounding inflammatory changes. Spleen: Stable hypodensity with calcifications in the spleen, likely cyst or hemangioma. The spleen is normal in size. Adrenals/Urinary Tract: No adrenal nodule or mass. Renal cortical scarring is noted on the right. Renal calculi are noted bilaterally. No hydroureteronephrosis. The bladder is unremarkable. Stomach/Bowel: Stomach is within normal limits. Appendix is not seen. No evidence of bowel wall thickening, distention, or acute inflammatory changes. Fatty infiltration of the walls of the colon is noted, compatible with chronic inflammatory changes. No free air or pneumatosis. Vascular/Lymphatic: Aortic atherosclerosis. No enlarged abdominal or pelvic lymph nodes. Reproductive: Uterus and bilateral adnexa are unremarkable. Other: No abdominopelvic ascites. Small fat containing umbilical hernia. Musculoskeletal: Mild degenerative changes in the lumbar spine. No acute osseous abnormality. IMPRESSION: 1. Bilateral nephrolithiasis with no hydroureteronephrosis bilaterally. 2. Hepatic steatosis. 3. Aortic atherosclerosis.  Electronically Signed   By: Thornell Sartorius M.D.   On: 08/24/2022 00:53    Procedures Procedures   Medications Ordered in ED Medications  potassium chloride SA (KLOR-CON M) CR tablet 40 mEq (  40 mEq Oral Patient Refused/Not Given 08/24/22 0335)  ondansetron (ZOFRAN-ODT) disintegrating tablet 4 mg (4 mg Oral Given 08/24/22 0317)  lactated ringers bolus 1,000 mL (0 mLs Intravenous Stopped 08/24/22 0335)  acetaminophen (TYLENOL) tablet 1,000 mg (1,000 mg Oral Given 08/24/22 0316)    ED Course/ Medical Decision Making/ A&P                           Medical Decision Making 52 year old female with history of flank pain x 2 days associated nausea vomiting and decreased urine output.  Hypertensive on intake, vital signs otherwise normal.  Cardiopulmonary exam unremarkable, abdominal exam as above with left CVAT and mild left lower quadrant tenderness palpation.  Patient ambulatory with cane in the ED.   The differential diagnosis of emergent flank pain includes, but is not limited to: Nephrolithiasis/ Renal Colic, Pyelonephritis, Abdominal aortic aneurysm, Aortic dissection, Renal artery embolism, Renal vein thrombosis, Renal infarction, Renal hemorrhage, Mesenteric ischemia, Bladder tumor, Cystitis, Biliary colic, Pancreatitis, Perforated peptic ulcer,  Appendicitis, Inguinal Hernia, Diverticulitis, Bowel obstruction. Shingles, Lower lobe pneumonia, Retroperitoneal hematoma/abscess/tumor, Epidural abscess, Epidural hematoma.  Particularly in females it is important to consider (Ectopic Pregnancy,PID/TOA,Ovarian cyst, Ovarian torsion, STD)     Amount and/or Complexity of Data Reviewed Labs: ordered.    Details: CBC without leukocytosis or anemia, BMP with mild hypokalemia of 3.1, mildly elevated anion gap to 17.  Patient is not pregnant.  Hepatic function panel with baseline total bili elevated 1.3.  UA with small hemoglobin, small leukocytes, few bacteria, positive for hyaline casts. Radiology:  ordered.    Details: CT with bilateral nephrolithiasis with no hydroureteronephrosis bilaterally.  Risk OTC drugs. Prescription drug management.   Patient with chronic pain syndrome, upon further discussion with the patient appears that her pain is more ongoing in nature.  No evidence of acute or emergent etiology of her symptoms on her workup today.  Clinical concern for emergent underlying etiology that would warrant further ED workup or inpatient management is low. Do suspect she was dehydrated given concentration of urine, patient received fluid bolus.  Tolerating p.o.  Patient also endorsing food insecurity at home, provided with some sandwiches to take with her.  No further workup warranted in the ER at this time.  Samoria voiced understanding of her medical evaluation and treatment plan. Each of their questions answered to their expressed satisfaction.  Return precautions were given.  Patient is well-appearing, stable, and was discharged in good condition.  This chart was dictated using voice recognition software, Dragon. Despite the best efforts of this provider to proofread and correct errors, errors may still occur which can change documentation meaning.  Final Clinical Impression(s) / ED Diagnoses Final diagnoses:  Flank pain    Rx / DC Orders ED Discharge Orders     None         Sherrilee Gilles 08/24/22 0433    Tilden Fossa, MD 08/25/22 [id number]

## 2022-08-25 LAB — URINE CULTURE

## 2022-08-31 IMAGING — CR DG FOOT COMPLETE 3+V*L*
3 series · 3 of 3 positions shown · non-contrast
Comparison: Contralateral foot of the same date.

CLINICAL DATA: Foot pain. Pain the LEFT ankle and foot for last 3
days, no history of injury.

EXAM:
LEFT FOOT - COMPLETE 3+ VIEW; LEFT ANKLE COMPLETE - 3+ VIEW

[t foot ap left]
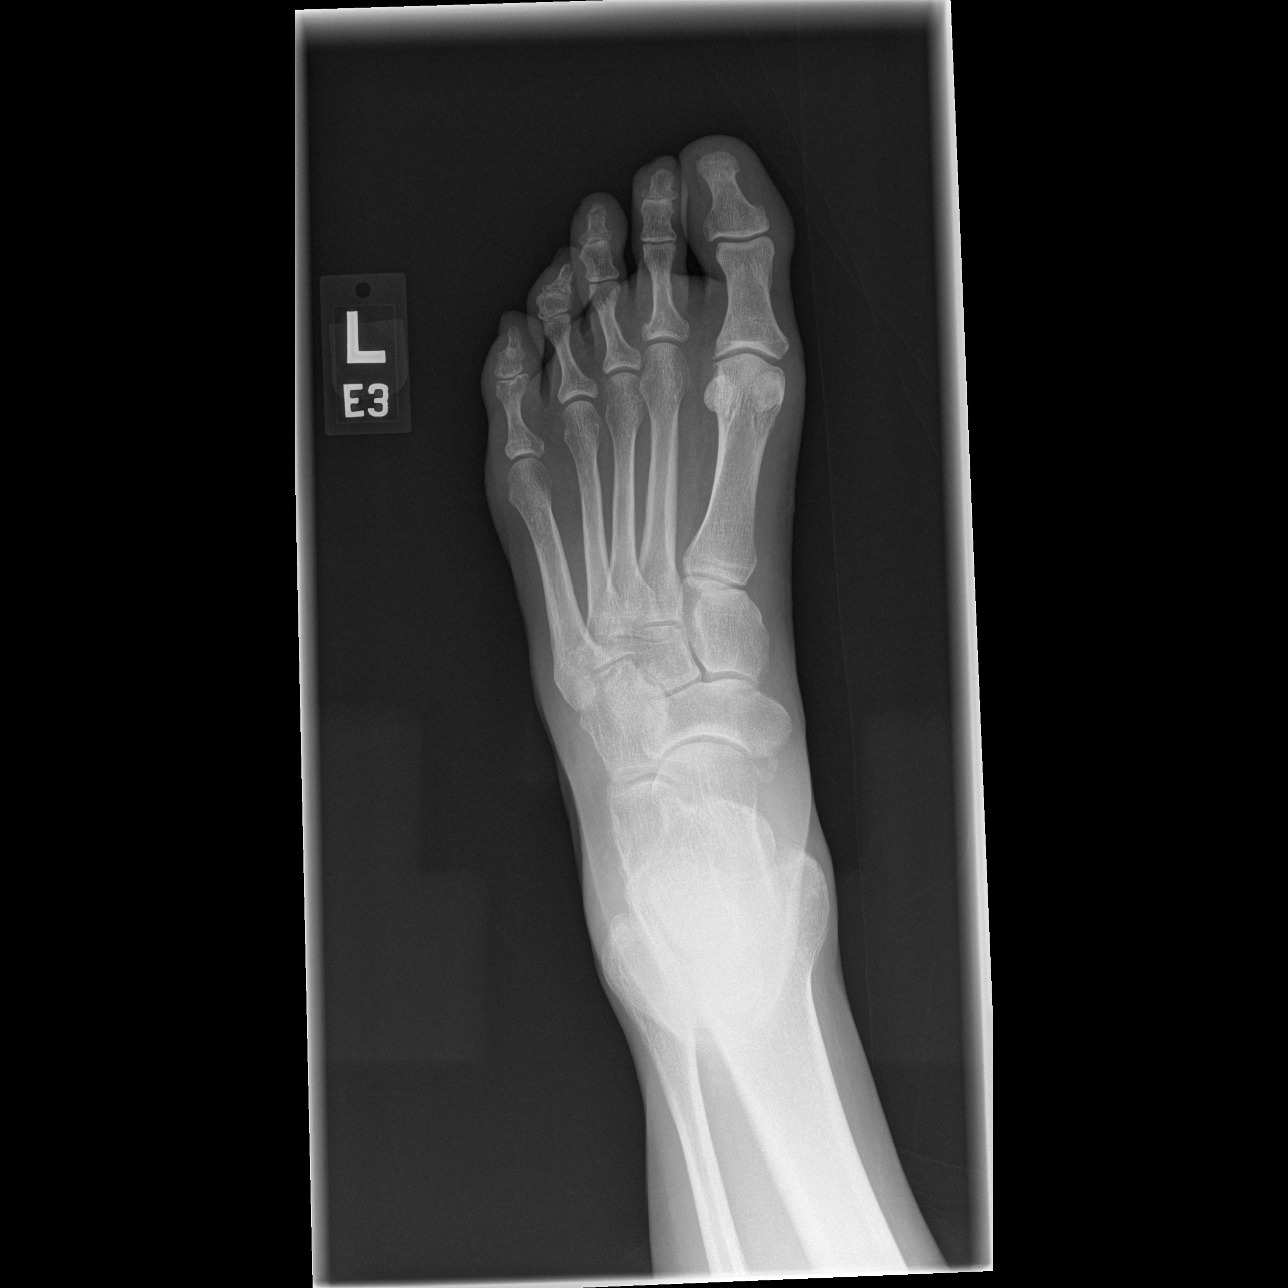

[t foot oblique left]
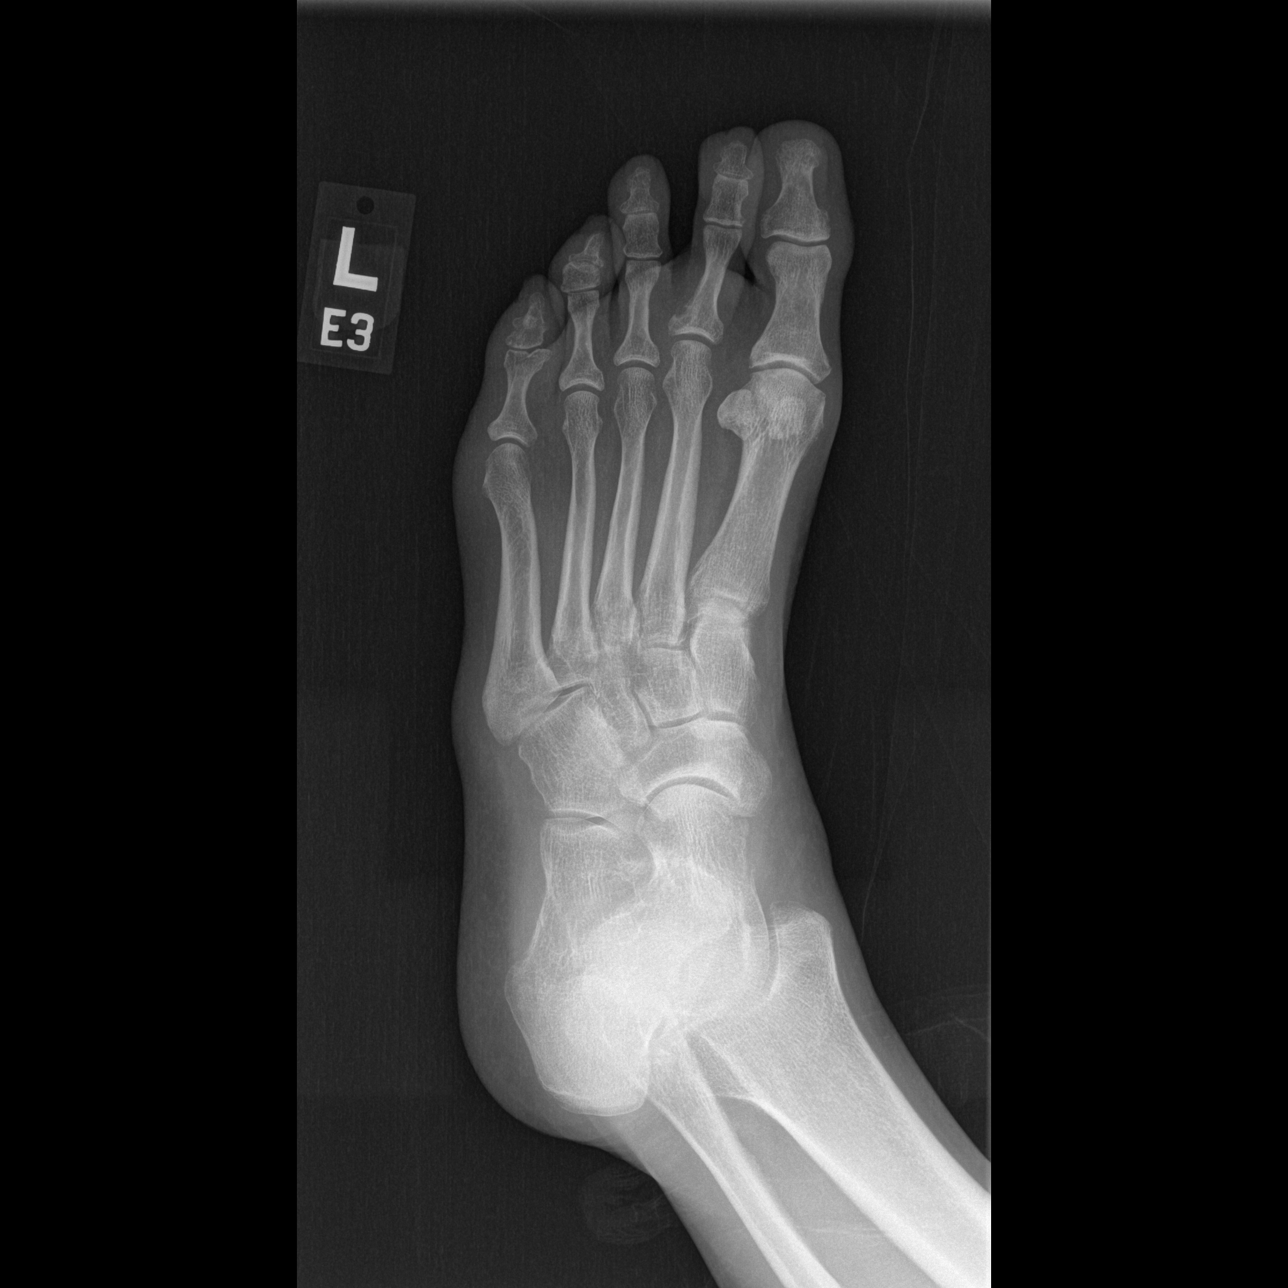

[t foot lat left]
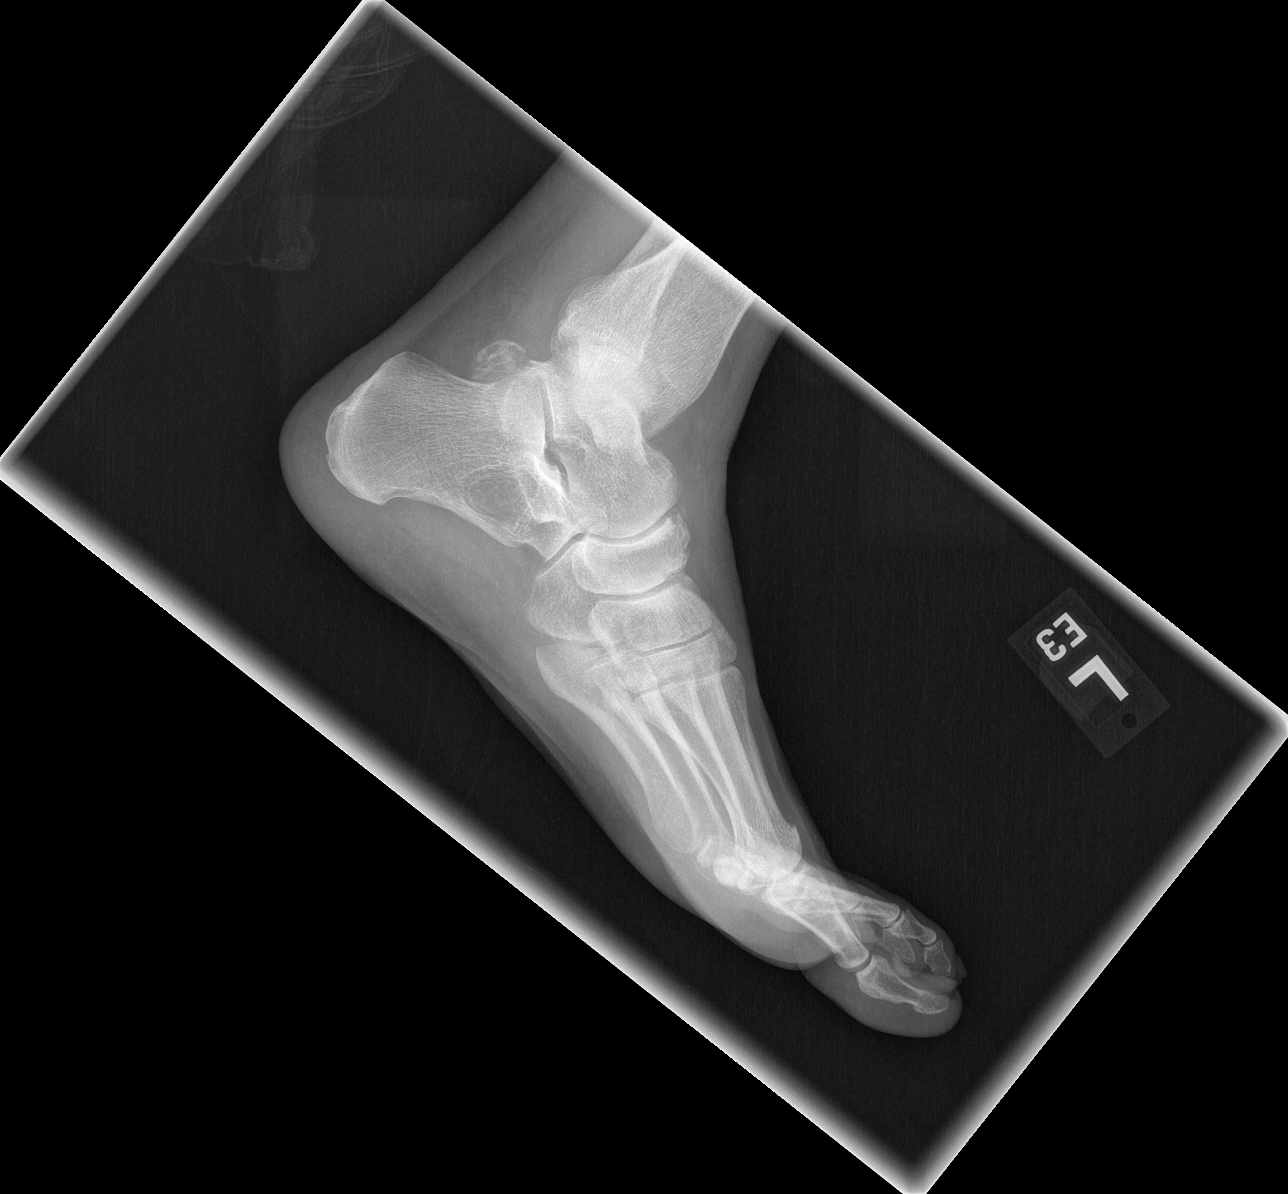

[3 of 3 positions shown; findings below may reference images not displayed]

FINDINGS: LEFT foot: Mild hallux valgus. Mild midfoot degenerative changes.
Nya Jumper lucency in the calcaneus, anterior
calcaneus likely calcaneal lipoma though could also be seen in a
unicameral bone cyst. Degenerative changes about the talar
calcaneal joint posteriorly with prominent posterior process of the
talus and os trigonum. No acute findings.

LEFT ankle: Midfoot degenerative changes. Lucency in the calcaneus
as discussed above. Prominent degenerative change about the
posterior process of the talus. No sign of acute fracture. No sign
of dislocation.
IMPRESSION: 1. Mild hallux valgus.
2. Mild midfoot degenerative changes.
3. Lucency in the calcaneus likely intraosseous lipoma or unicameral
bone cyst.
4. No acute findings.

## 2022-08-31 IMAGING — CR DG FOOT COMPLETE 3+V*R*
3 series · 3 of 3 positions shown · non-contrast
Comparison: 05/13/2019

CLINICAL DATA: Right foot and ankle pain

EXAM:
RIGHT ANKLE - COMPLETE 3+ VIEW; RIGHT FOOT COMPLETE - 3+ VIEW

[t foot lat right]
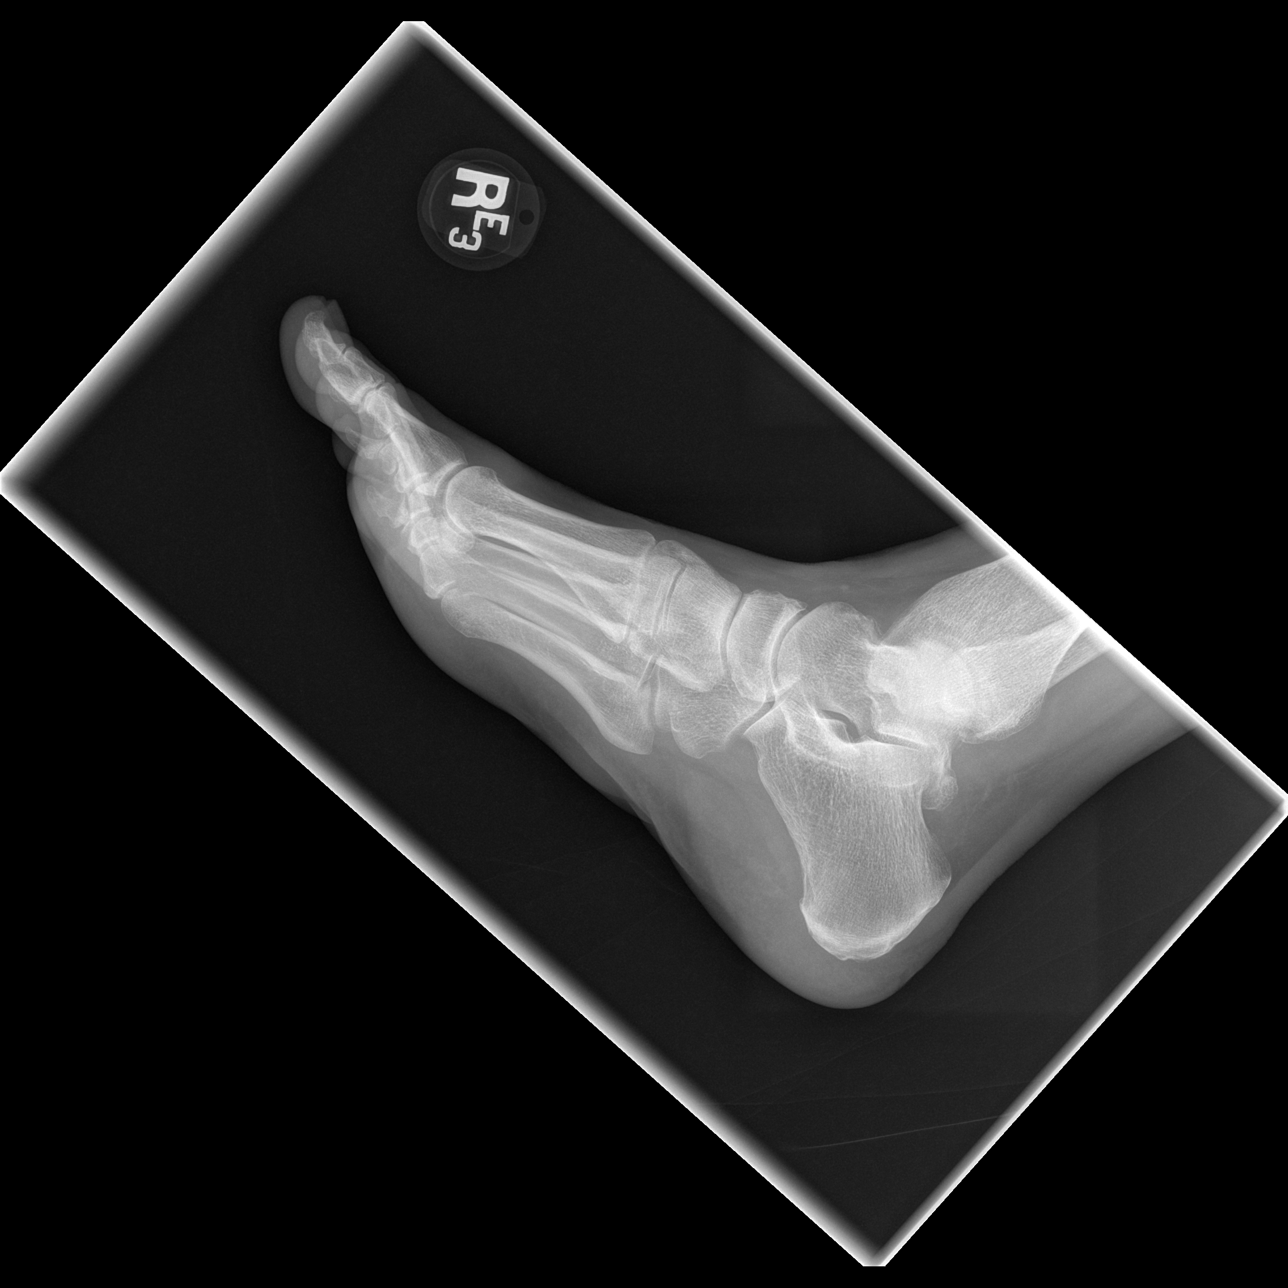

[t foot ap right]
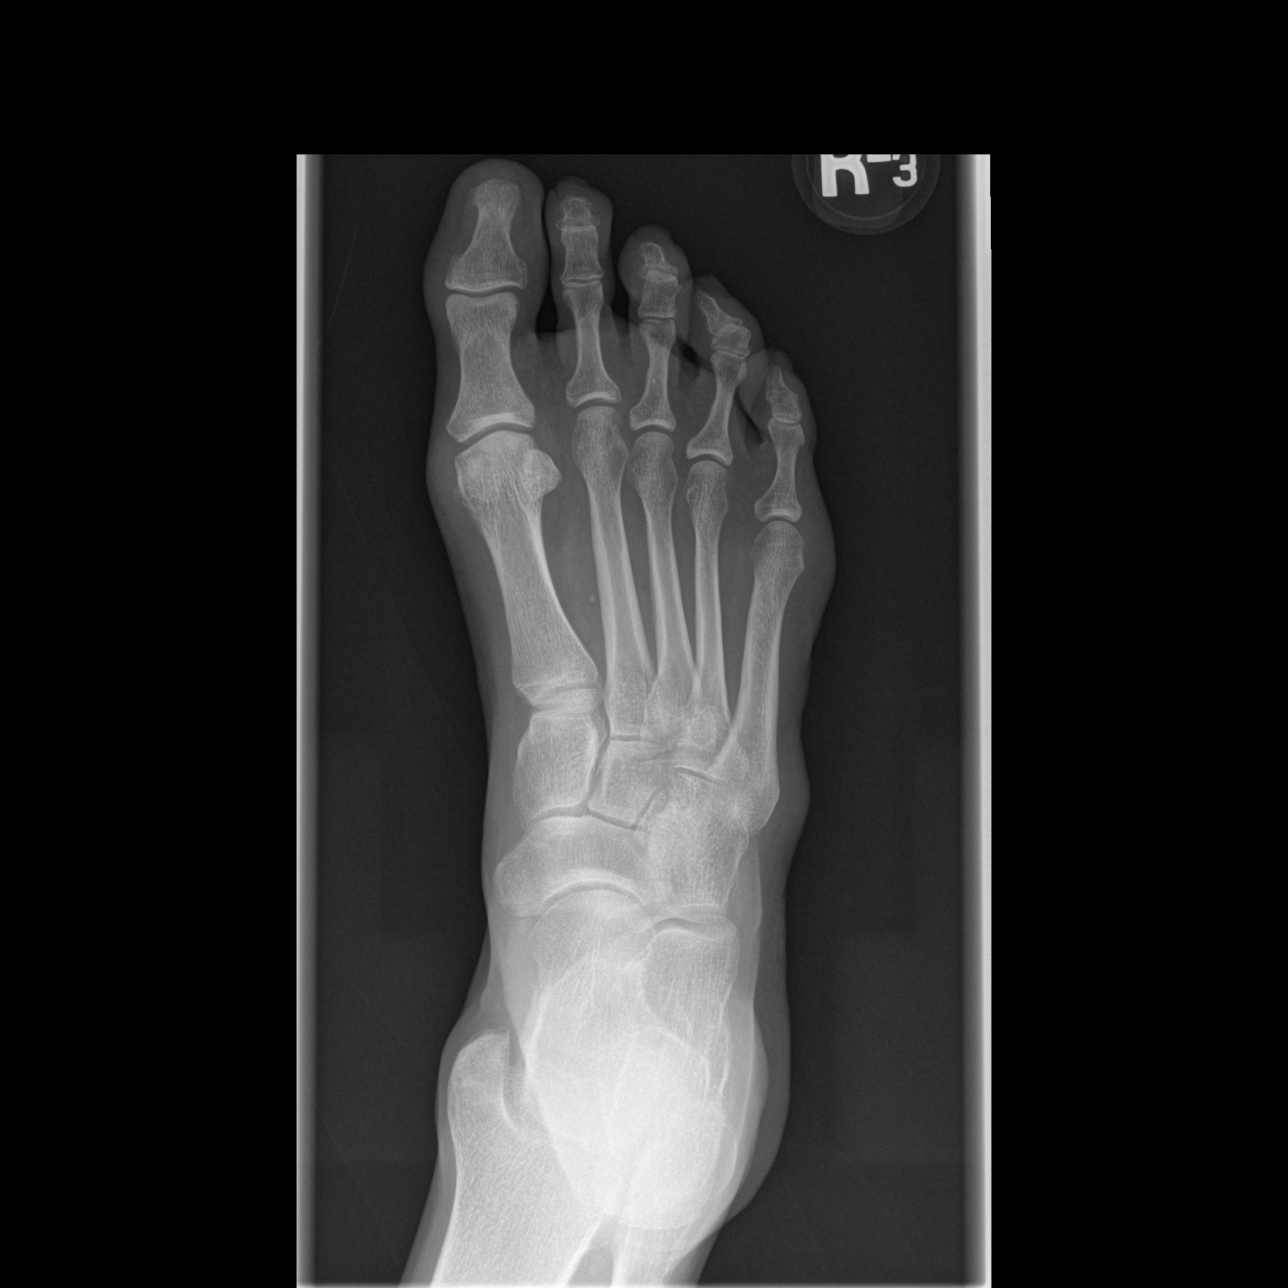

[t foot oblique right]
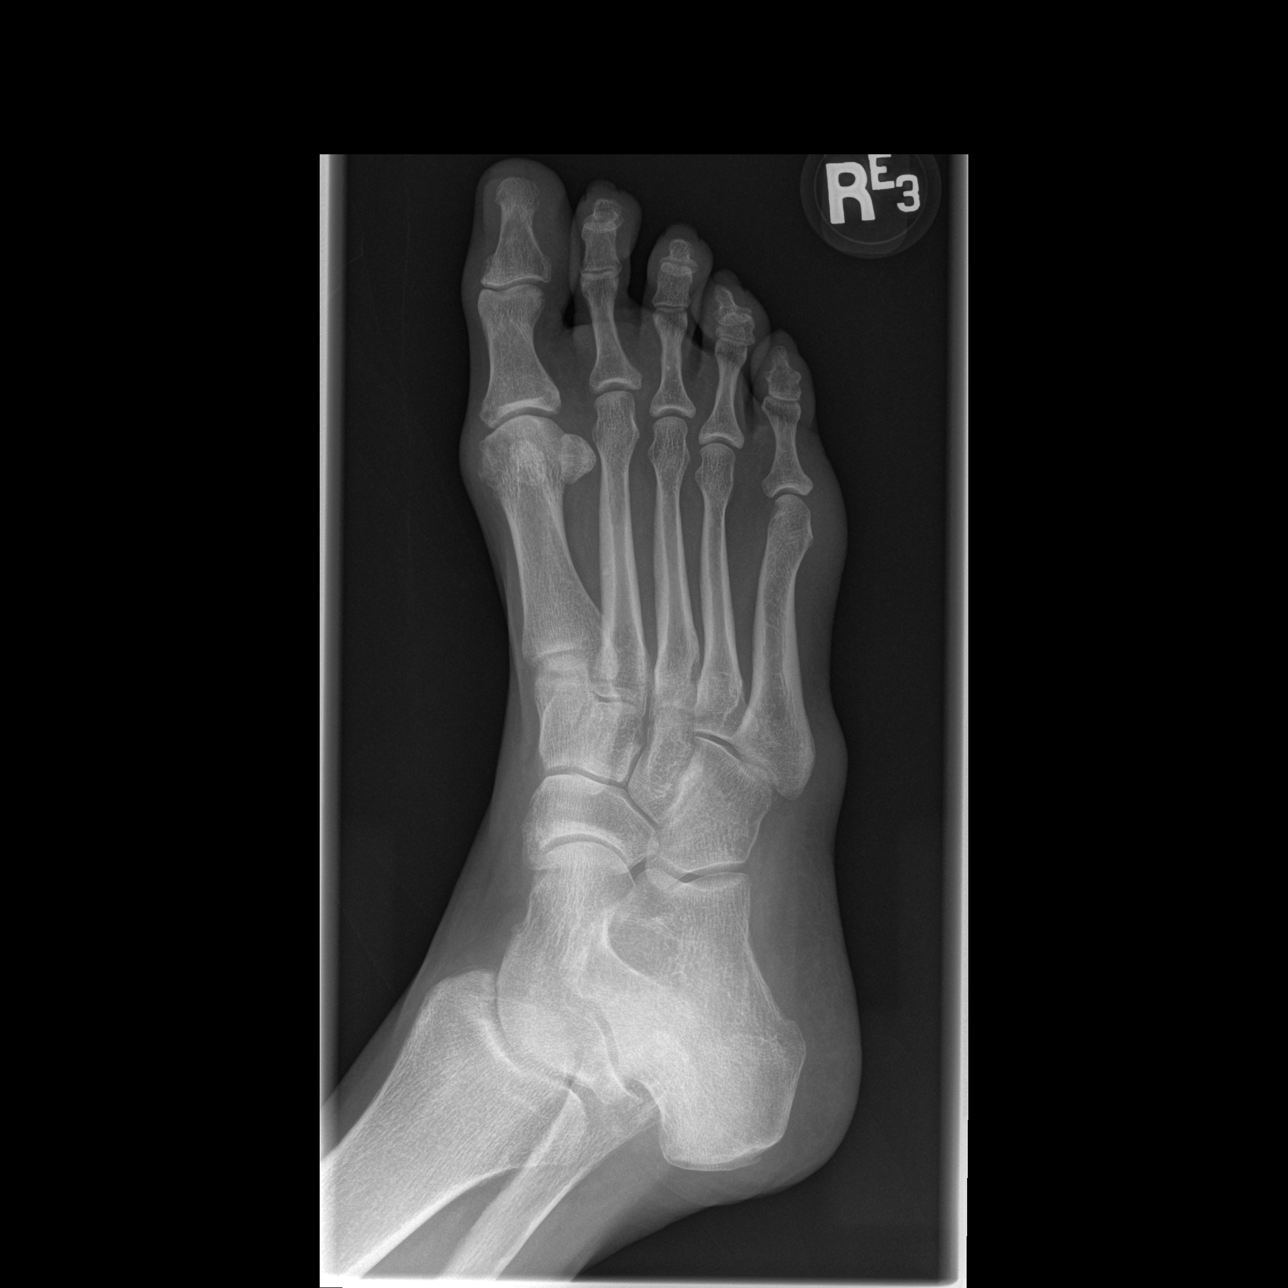

[3 of 3 positions shown; findings below may reference images not displayed]

FINDINGS: No acute fracture or dislocation of the right foot or ankle.
Possible early marginal erosive changes along the dorsomedial aspect
of the first metatarsal head. Joint spaces are preserved. Mild soft
tissue prominence overlies the first MTP joint medially. No focal
soft tissue swelling about the ankle.
IMPRESSION: 1. No acute osseous abnormality of the right foot or ankle.
2. Possible early marginal erosive changes along the dorsomedial
aspect of the first metatarsal head, which could reflect an
underlying crystalline arthropathy such as gout.

## 2022-09-07 ENCOUNTER — Other Ambulatory Visit: Payer: Self-pay

## 2022-09-07 MED ORDER — BUPRENORPHINE HCL 8 MG SL SUBL
4.0000 mg | SUBLINGUAL_TABLET | Freq: Every day | SUBLINGUAL | 0 refills | Status: DC
  Filled 2022-09-07 – 2022-09-21 (×2): qty 15, 30d supply, fill #0

## 2022-09-07 MED ORDER — TRAMADOL HCL 50 MG PO TABS
50.0000 mg | ORAL_TABLET | Freq: Four times a day (QID) | ORAL | 0 refills | Status: DC | PRN
  Filled 2022-09-07: qty 120, 30d supply, fill #0

## 2022-09-07 MED ORDER — LISINOPRIL-HYDROCHLOROTHIAZIDE 20-25 MG PO TABS
1.0000 | ORAL_TABLET | Freq: Every day | ORAL | 1 refills | Status: DC
  Filled 2022-09-07: qty 90, 90d supply, fill #0

## 2022-09-09 ENCOUNTER — Other Ambulatory Visit: Payer: Self-pay | Admitting: Student

## 2022-09-09 ENCOUNTER — Other Ambulatory Visit: Payer: Self-pay

## 2022-09-12 ENCOUNTER — Other Ambulatory Visit (HOSPITAL_COMMUNITY): Payer: Self-pay

## 2022-09-12 ENCOUNTER — Other Ambulatory Visit: Payer: Self-pay

## 2022-09-12 ENCOUNTER — Inpatient Hospital Stay (HOSPITAL_COMMUNITY)
Admission: EM | Admit: 2022-09-12 | Discharge: 2022-09-19 | DRG: 311 | Disposition: A | Discharge status: Home / Self Care | Payer: Medicaid Other | Attending: Internal Medicine | Admitting: Internal Medicine

## 2022-09-12 ENCOUNTER — Encounter (HOSPITAL_COMMUNITY): Payer: Self-pay

## 2022-09-12 ENCOUNTER — Emergency Department (HOSPITAL_COMMUNITY): Payer: Medicaid Other

## 2022-09-12 DIAGNOSIS — I10 Essential (primary) hypertension: Secondary | ICD-10-CM

## 2022-09-12 DIAGNOSIS — F1021 Alcohol dependence, in remission: Secondary | ICD-10-CM | POA: Diagnosis present

## 2022-09-12 DIAGNOSIS — F411 Generalized anxiety disorder: Secondary | ICD-10-CM | POA: Diagnosis present

## 2022-09-12 DIAGNOSIS — F1193 Opioid use, unspecified with withdrawal: Secondary | ICD-10-CM | POA: Diagnosis not present

## 2022-09-12 DIAGNOSIS — Z79899 Other long term (current) drug therapy: Secondary | ICD-10-CM

## 2022-09-12 DIAGNOSIS — L03011 Cellulitis of right finger: Secondary | ICD-10-CM

## 2022-09-12 DIAGNOSIS — Z789 Other specified health status: Secondary | ICD-10-CM | POA: Diagnosis not present

## 2022-09-12 DIAGNOSIS — F1190 Opioid use, unspecified, uncomplicated: Secondary | ICD-10-CM | POA: Diagnosis present

## 2022-09-12 DIAGNOSIS — F1090 Alcohol use, unspecified, uncomplicated: Secondary | ICD-10-CM

## 2022-09-12 DIAGNOSIS — Y900 Blood alcohol level of less than 20 mg/100 ml: Secondary | ICD-10-CM | POA: Diagnosis present

## 2022-09-12 DIAGNOSIS — R7989 Other specified abnormal findings of blood chemistry: Secondary | ICD-10-CM | POA: Diagnosis not present

## 2022-09-12 DIAGNOSIS — E876 Hypokalemia: Secondary | ICD-10-CM

## 2022-09-12 DIAGNOSIS — G3184 Mild cognitive impairment, so stated: Secondary | ICD-10-CM | POA: Diagnosis present

## 2022-09-12 DIAGNOSIS — Z880 Allergy status to penicillin: Secondary | ICD-10-CM

## 2022-09-12 DIAGNOSIS — Z87891 Personal history of nicotine dependence: Secondary | ICD-10-CM

## 2022-09-12 DIAGNOSIS — G4733 Obstructive sleep apnea (adult) (pediatric): Secondary | ICD-10-CM | POA: Diagnosis present

## 2022-09-12 DIAGNOSIS — Z681 Body mass index (BMI) 19 or less, adult: Secondary | ICD-10-CM

## 2022-09-12 DIAGNOSIS — N179 Acute kidney failure, unspecified: Secondary | ICD-10-CM

## 2022-09-12 DIAGNOSIS — K5090 Crohn's disease, unspecified, without complications: Secondary | ICD-10-CM | POA: Diagnosis present

## 2022-09-12 DIAGNOSIS — F1191 Opioid use, unspecified, in remission: Secondary | ICD-10-CM

## 2022-09-12 DIAGNOSIS — F329 Major depressive disorder, single episode, unspecified: Secondary | ICD-10-CM | POA: Diagnosis present

## 2022-09-12 DIAGNOSIS — G9341 Metabolic encephalopathy: Secondary | ICD-10-CM | POA: Diagnosis present

## 2022-09-12 DIAGNOSIS — E440 Moderate protein-calorie malnutrition: Secondary | ICD-10-CM | POA: Insufficient documentation

## 2022-09-12 DIAGNOSIS — G894 Chronic pain syndrome: Secondary | ICD-10-CM

## 2022-09-12 DIAGNOSIS — I2489 Other forms of acute ischemic heart disease: Principal | ICD-10-CM | POA: Diagnosis present

## 2022-09-12 DIAGNOSIS — Z8349 Family history of other endocrine, nutritional and metabolic diseases: Secondary | ICD-10-CM

## 2022-09-12 DIAGNOSIS — R112 Nausea with vomiting, unspecified: Principal | ICD-10-CM | POA: Diagnosis present

## 2022-09-12 HISTORY — DX: Other specified abnormal findings of blood chemistry: R79.89

## 2022-09-12 HISTORY — DX: Opioid use, unspecified with withdrawal: F11.93

## 2022-09-12 LAB — COMPREHENSIVE METABOLIC PANEL
ALT: 20 U/L (ref 0–44)
AST: 40 U/L (ref 15–41)
Albumin: 3.2 g/dL — ABNORMAL LOW (ref 3.5–5.0)
Alkaline Phosphatase: 120 U/L (ref 38–126)
Anion gap: 18 — ABNORMAL HIGH (ref 5–15)
BUN: 18 mg/dL (ref 6–20)
CO2: 23 mmol/L (ref 22–32)
Calcium: 8.5 mg/dL — ABNORMAL LOW (ref 8.9–10.3)
Chloride: 97 mmol/L — ABNORMAL LOW (ref 98–111)
Creatinine, Ser: 1.22 mg/dL — ABNORMAL HIGH (ref 0.44–1.00)
GFR, Estimated: 53 mL/min — ABNORMAL LOW (ref >60)
Glucose, Bld: 103 mg/dL — ABNORMAL HIGH (ref 70–99)
Potassium: 2.9 mmol/L — ABNORMAL LOW (ref 3.5–5.1)
Sodium: 138 mmol/L (ref 135–145)
Total Bilirubin: 1 mg/dL (ref 0.3–1.2)
Total Protein: 6.5 g/dL (ref 6.5–8.1)

## 2022-09-12 LAB — RAPID URINE DRUG SCREEN, HOSP PERFORMED
Amphetamines: NOT DETECTED
Barbiturates: NOT DETECTED
Benzodiazepines: NOT DETECTED
Cocaine: NOT DETECTED
Opiates: NOT DETECTED
Tetrahydrocannabinol: NOT DETECTED

## 2022-09-12 LAB — CBC
HCT: 38.2 % (ref 36.0–46.0)
Hemoglobin: 13.6 g/dL (ref 12.0–15.0)
MCH: 36.1 pg — ABNORMAL HIGH (ref 26.0–34.0)
MCHC: 35.6 g/dL (ref 30.0–36.0)
MCV: 101.3 fL — ABNORMAL HIGH (ref 80.0–100.0)
Platelets: 244 10*3/uL (ref 150–400)
RBC: 3.77 MIL/uL — ABNORMAL LOW (ref 3.87–5.11)
RDW: 13 % (ref 11.5–15.5)
WBC: 7.5 10*3/uL (ref 4.0–10.5)
nRBC: 0 % (ref 0.0–0.2)

## 2022-09-12 LAB — URINALYSIS, ROUTINE W REFLEX MICROSCOPIC
Bilirubin Urine: NEGATIVE
Glucose, UA: NEGATIVE mg/dL
Ketones, ur: NEGATIVE mg/dL
Nitrite: NEGATIVE
Protein, ur: 100 mg/dL — AB
Specific Gravity, Urine: 1.017 (ref 1.005–1.030)
WBC, UA: >50 WBC/hpf — ABNORMAL HIGH (ref 0–5)
pH: 6 (ref 5.0–8.0)

## 2022-09-12 LAB — TROPONIN I (HIGH SENSITIVITY)
Troponin I (High Sensitivity): 56 ng/L — ABNORMAL HIGH (ref <18)
Troponin I (High Sensitivity): 67 ng/L — ABNORMAL HIGH (ref <18)

## 2022-09-12 LAB — LIPASE, BLOOD: Lipase: 47 U/L (ref 11–51)

## 2022-09-12 LAB — ETHANOL: Alcohol, Ethyl (B): <10 mg/dL (ref <10)

## 2022-09-12 MED ORDER — ADULT MULTIVITAMIN W/MINERALS CH
1.0000 | ORAL_TABLET | Freq: Every day | ORAL | Status: DC
  Administered 2022-09-13 – 2022-09-19 (×7): 1 via ORAL
  Filled 2022-09-12 (×8): qty 1

## 2022-09-12 MED ORDER — LACTATED RINGERS IV SOLN: INTRAVENOUS | Status: AC

## 2022-09-12 MED ORDER — MORPHINE SULFATE (PF) 4 MG/ML IV SOLN
4.0000 mg | Freq: Once | INTRAVENOUS | Status: AC
  Administered 2022-09-12: 4 mg via INTRAVENOUS
  Filled 2022-09-12: qty 1

## 2022-09-12 MED ORDER — LORAZEPAM 1 MG PO TABS
1.0000 mg | ORAL_TABLET | ORAL | Status: DC | PRN
  Administered 2022-09-13 – 2022-09-14 (×2): 2 mg via ORAL
  Administered 2022-09-14: 1 mg via ORAL
  Administered 2022-09-14: 2 mg via ORAL
  Filled 2022-09-12: qty 2
  Filled 2022-09-12: qty 1
  Filled 2022-09-12 (×2): qty 2

## 2022-09-12 MED ORDER — ONDANSETRON HCL 4 MG/2ML IJ SOLN
4.0000 mg | Freq: Once | INTRAMUSCULAR | Status: AC
  Administered 2022-09-12: 4 mg via INTRAVENOUS
  Filled 2022-09-12: qty 2

## 2022-09-12 MED ORDER — SODIUM CHLORIDE 0.9 % IV BOLUS
1000.0000 mL | Freq: Once | INTRAVENOUS | Status: AC
  Administered 2022-09-12: 1000 mL via INTRAVENOUS

## 2022-09-12 MED ORDER — LORAZEPAM 1 MG PO TABS
1.0000 mg | ORAL_TABLET | ORAL | Status: DC | PRN
  Administered 2022-09-13: 1 mg via ORAL
  Filled 2022-09-12: qty 1

## 2022-09-12 MED ORDER — CEPHALEXIN 500 MG PO CAPS
500.0000 mg | ORAL_CAPSULE | Freq: Two times a day (BID) | ORAL | Status: DC
  Administered 2022-09-12 – 2022-09-19 (×14): 500 mg via ORAL
  Filled 2022-09-12 (×14): qty 1

## 2022-09-12 MED ORDER — POTASSIUM CHLORIDE 10 MEQ/100ML IV SOLN
10.0000 meq | Freq: Once | INTRAVENOUS | Status: AC
  Administered 2022-09-12: 10 meq via INTRAVENOUS
  Filled 2022-09-12: qty 100

## 2022-09-12 MED ORDER — LORAZEPAM 1 MG PO TABS
1.0000 mg | ORAL_TABLET | Freq: Once | ORAL | Status: DC
  Filled 2022-09-12: qty 1

## 2022-09-12 MED ORDER — LACTATED RINGERS IV SOLN: INTRAVENOUS | Status: DC

## 2022-09-12 MED ORDER — POTASSIUM CHLORIDE 10 MEQ/100ML IV SOLN
10.0000 meq | INTRAVENOUS | Status: AC
  Administered 2022-09-12 – 2022-09-13 (×2): 10 meq via INTRAVENOUS
  Filled 2022-09-12 (×2): qty 100

## 2022-09-12 MED ORDER — FOLIC ACID 1 MG PO TABS
1.0000 mg | ORAL_TABLET | Freq: Every day | ORAL | Status: DC
  Administered 2022-09-13 – 2022-09-19 (×7): 1 mg via ORAL
  Filled 2022-09-12 (×8): qty 1

## 2022-09-12 MED ORDER — ASPIRIN 325 MG PO TABS
325.0000 mg | ORAL_TABLET | Freq: Once | ORAL | Status: AC
  Administered 2022-09-12: 325 mg via ORAL
  Filled 2022-09-12: qty 1

## 2022-09-12 MED ORDER — THIAMINE HCL 100 MG/ML IJ SOLN
100.0000 mg | Freq: Every day | INTRAMUSCULAR | Status: DC
  Administered 2022-09-12 – 2022-09-17 (×2): 100 mg via INTRAVENOUS
  Filled 2022-09-12 (×3): qty 2

## 2022-09-12 MED ORDER — THIAMINE MONONITRATE 100 MG PO TABS
100.0000 mg | ORAL_TABLET | Freq: Every day | ORAL | Status: DC
  Administered 2022-09-13 – 2022-09-19 (×6): 100 mg via ORAL
  Filled 2022-09-12 (×7): qty 1

## 2022-09-12 NOTE — Assessment & Plan Note (Signed)
Stable

## 2022-09-12 NOTE — ED Provider Notes (Signed)
[facility] Provider Note   CSN: [id number] Arrival date & time: 09/12/22  1336     History  Chief Complaint  Patient presents with   Emesis    [name] is a 52 y.o. female.   Emesis Patient presents with nausea and vomiting.  States she has had for days now.  States she had a stress test recently and has had symptoms since.  States since the stress test she had nausea and vomiting.  States really not been able to drink.  States she drinks about 2 drinks a day and has cut back to about 2 drinks a day.  States she has numbness in both her arms and both her legs.  States they feel numb and she has trouble walking.  States she is almost fallen.  Has a headache at times.  No confusion.  States just feels better.   Past Medical History:  Diagnosis Date   Alcohol dependence in sustained full remission    Benign essential hypertension 04/09/2021   Chronic pain syndrome 04/09/2021   Crohn's disease    Dilated cbd, acquired 04/09/2021   Gallstone pancreatitis 04/09/2021   Generalized anxiety disorder    Gout 04/09/2021   History of heroin use    Kidney stones    Major depressive disorder    Obstructive sleep apnea     Home Medications Prior to Admission medications   Medication Sig Start Date End Date Taking? Authorizing Provider  Blood Pressure Monitoring (BLOOD PRESSURE KIT) KIT 1 (one) kit daily 07/13/22     buprenorphine (SUBUTEX) 8 MG SUBL SL tablet 1/2 each under tongue daily Patient not taking: Reported on 08/17/2022 04/13/22     buprenorphine (SUBUTEX) 8 MG SUBL SL tablet Place 0.5 tablets (4 mg total) under the tongue daily. Patient not taking: Reported on 08/17/2022 05/04/22     buprenorphine (SUBUTEX) 8 MG SUBL SL tablet Place 0.5 tablets (4 mg total) under the tongue daily. Patient not taking: Reported on 08/17/2022 06/01/22     buprenorphine (SUBUTEX) 8 MG SUBL SL tablet Place 0.5 tablets (4 mg total) under the  tongue daily. Patient not taking: Reported on 08/17/2022 07/04/22     buprenorphine (SUBUTEX) 8 MG SUBL SL tablet Place 1/2 tablet under each tongue daily 08/03/22     buprenorphine (SUBUTEX) 8 MG SUBL SL tablet Place 0.5 tablets (4 mg total) under the tongue daily. 09/06/22     cephALEXin (KEFLEX) 500 MG capsule 1 (one) capsule by mouth two times daily, for UTI Patient not taking: Reported on 08/17/2022 03/15/22     cholecalciferol (VITAMIN D3) 25 MCG (1000 UNIT) tablet Take 1,000 Units by mouth daily.    [provider]  cloNIDine (CATAPRES - DOSED IN MG/24 HR) 0.2 mg/24hr patch Apply 1 patch (0.2 mg total) onto the skin once a week. Patient not taking: Reported on 08/17/2022 10/21/21   Kerin Perna, NP  doxycycline (VIBRA-TABS) 100 MG tablet Take 1 tablet (100 mg total) by mouth 2 (two) times daily. 08/17/22   Marcello Fennel, PA-C  esomeprazole (NEXIUM) 40 MG capsule Take 1 capsule (40 mg total) by mouth daily. Patient not taking: Reported on 59/93/5701 77/93/90     folic acid (FOLVITE) 1 MG tablet Take 1 tablet (1 mg total) by mouth daily. Patient not taking: Reported on 08/17/2022 04/15/21   Rai, Vernelle Emerald, MD  gabapentin (NEURONTIN) 300 MG capsule Take 1 capsule (300 mg total) by mouth 3 (three)  times daily. Patient not taking: Reported on 01/04/2022 04/14/21   Rai, Vernelle Emerald, MD  lisinopril-hydrochlorothiazide (ZESTORETIC) 20-25 MG tablet Take 1 tablet by mouth daily. Patient not taking: Reported on 08/17/2022 07/13/22     lisinopril-hydrochlorothiazide (ZESTORETIC) 20-25 MG tablet Take 1 tablet by mouth daily. 08/08/22     lisinopril-hydrochlorothiazide (ZESTORETIC) 20-25 MG tablet Take 1 tablet by mouth daily 09/06/22     omeprazole (PRILOSEC) 40 MG capsule Take 1 capsule (40 mg total) by mouth daily. 08/03/22     ondansetron (ZOFRAN-ODT) 4 MG disintegrating tablet Dissolve 1 tablet (4 mg total) by mouth every 8 (eight) hours as needed for nausea or vomiting. 08/17/22    Marcello Fennel, PA-C  polyethylene glycol (GAVILYTE-C) 240 g solution Use as directe per prep sheet. Patient not taking: Reported on 08/17/2022 08/10/22     potassium chloride (KLOR-CON) 10 MEQ tablet Take 2 tablets by mouth once a day . 08/03/22   Claudia Desanctis, MD  Potassium Chloride ER 20 MEQ TBCR Take 1 tablet by mouth daily. Patient not taking: Reported on 08/17/2022 07/26/22     traMADol (ULTRAM) 50 MG tablet 1 (one) tablet by mouth four times daily, as needed Patient not taking: Reported on 08/17/2022 03/15/22     traMADol (ULTRAM) 50 MG tablet Take 1 tablet (50 mg total) by mouth 4 (four) times daily as needed. Patient not taking: Reported on 08/17/2022 05/04/22     traMADol (ULTRAM) 50 MG tablet Take 1 tablet (50 mg total) by mouth 4 (four) times daily as needed. Patient not taking: Reported on 08/17/2022 07/04/22     traMADol (ULTRAM) 50 MG tablet Take 1 tablet (50 mg total) by mouth 4 (four) times daily as needed. Patient taking differently: Take 50 mg by mouth every 6 (six) hours as needed for moderate pain or severe pain. 08/03/22     traMADol (ULTRAM) 50 MG tablet Take 1 tablet (50 mg total) by mouth 4 (four) times daily as needed. 09/06/22     valsartan-hydrochlorothiazide (DIOVAN-HCT) 160-25 MG tablet Take 1 tablet by mouth daily. Patient not taking: Reported on 08/17/2022 01/04/22   Kerin Perna, NP      Allergies    Penicillins    Review of Systems   Review of Systems  Gastrointestinal:  Positive for vomiting.    Physical Exam Updated Vital Signs BP (!) 130/98   Pulse 78   Temp 97.7 F (36.5 C) (Oral)   Resp 18   SpO2 100%  Physical Exam Vitals and nursing note reviewed.  Eyes:     Pupils: Pupils are equal, round, and reactive to light.  Cardiovascular:     Rate and Rhythm: Regular rhythm.  Pulmonary:     Breath sounds: No wheezing or rhonchi.  Abdominal:     Tenderness: There is no abdominal tenderness.  Musculoskeletal:        General: No  tenderness.     Cervical back: Neck supple.  Skin:    General: Skin is warm.  Neurological:     Mental Status: She is alert.     Comments: Awake and appropriate.  Moving all extremities.  Face symmetric.  Good grip strength bilaterally.  Sensation grossly intact bilaterally on face and extremities.     ED Results / Procedures / Treatments   Labs (all labs ordered are listed, but only abnormal results are displayed) Labs Reviewed  COMPREHENSIVE METABOLIC PANEL - Abnormal; Notable for the following components:      Result Value  Potassium 2.9 (*)    Chloride 97 (*)    Glucose, Bld 103 (*)    Creatinine, Ser 1.22 (*)    Calcium 8.5 (*)    Albumin 3.2 (*)    GFR, Estimated 53 (*)    Anion gap 18 (*)    All other components within normal limits  CBC - Abnormal; Notable for the following components:   RBC 3.77 (*)    MCV 101.3 (*)    MCH 36.1 (*)    All other components within normal limits  TROPONIN I (HIGH SENSITIVITY) - Abnormal; Notable for the following components:   Troponin I (High Sensitivity) 56 (*)    All other components within normal limits  TROPONIN I (HIGH SENSITIVITY) - Abnormal; Notable for the following components:   Troponin I (High Sensitivity) 67 (*)    All other components within normal limits  LIPASE, BLOOD  ETHANOL  RAPID URINE DRUG SCREEN, HOSP PERFORMED  URINALYSIS, ROUTINE W REFLEX MICROSCOPIC    EKG EKG Interpretation  Date/Time:  Monday September 12 2022 16:20:30 EST Ventricular Rate:  93 PR Interval:  145 QRS Duration: 88 QT Interval:  402 QTC Calculation: 500 R Axis:   51 Text Interpretation: Sinus rhythm Biatrial enlargement Anterior infarct, old No significant change since last tracing Confirmed by Benjiman Core [phone]) on 09/12/2022 6:19:14 PM  Radiology CT HEAD WO CONTRAST ( )  Result Date: 09/12/2022 CLINICAL DATA:  Nausea, vomiting, and weakness. Numbness and tingling in all extremities. Alcohol withdrawal. EXAM: CT HEAD  WITHOUT CONTRAST TECHNIQUE: Contiguous axial images were obtained from the base of the skull through the vertex without intravenous contrast. RADIATION DOSE REDUCTION: This exam was performed according to the departmental dose-optimization program which includes automated exposure control, adjustment of the mA and/or kV according to patient size and/or use of iterative reconstruction technique. COMPARISON:  MRI brain dated July 11, 2021. CT head dated October 16, 2007. FINDINGS: Brain: No evidence of acute infarction, hemorrhage, hydrocephalus, extra-axial collection or mass lesion/mass effect. Prominent perivascular space in the left basal ganglia again noted. Vascular: No hyperdense vessel or unexpected calcification. Skull: Normal. Negative for fracture or focal lesion. Sinuses/Orbits: No acute finding. Other: None. IMPRESSION: 1. No acute intracranial abnormality. Electronically Signed   By: Obie Dredge M.D.   On: 09/12/2022 18:28    Procedures Procedures    Medications Ordered in ED Medications  LORazepam (ATIVAN) tablet 1 mg (1 mg Oral Not Given 09/12/22 1915)  LORazepam (ATIVAN) tablet 1-4 mg (has no administration in time range)    Or  LORazepam (ATIVAN) tablet 1 mg (has no administration in time range)  thiamine (VITAMIN B1) tablet 100 mg ( Oral See Alternative 09/12/22 1724)    Or  thiamine (VITAMIN B1) injection 100 mg (100 mg Intravenous Given 09/12/22 1724)  folic acid (FOLVITE) tablet 1 mg (1 mg Oral Not Given 09/12/22 1728)  multivitamin with minerals tablet 1 tablet (1 tablet Oral Not Given 09/12/22 1915)  sodium chloride 0.9 % bolus 1,000 mL (0 mLs Intravenous Stopped 09/12/22 1915)  ondansetron (ZOFRAN) injection 4 mg (4 mg Intravenous Given 09/12/22 1722)  potassium chloride 10 mEq in 100 mL IVPB (0 mEq Intravenous Stopped 09/12/22 2156)  morphine (PF) 4 MG/ML injection 4 mg (4 mg Intravenous Given 09/12/22 2020)    ED Course/ Medical Decision Making/ A&P  Medical Decision Making Amount and/or Complexity of Data Reviewed Labs: ordered. Radiology: ordered.  Risk Prescription drug management.   Patient presents with nausea vomiting and reported numbness.  States began after getting a stress test.  Also states she is cut back on her drinking but the amount she gives for before and after are the same.  Will get head CT to evaluate for pathology such as intracranial hemorrhage.  Will get some basic blood work.  Will give some fluids and antiemetics.  I reviewed previous neurology notes.  CT scan reassuring.  Has had some left-sided chest pain at times.  Has been seen by cardiology through [facility].  States she had a stress test but has not gotten the results yet.  States they had to stop it because she was vomiting during it with the medicine. EKG is abnormal but stable to prior.  However troponin mildly elevated.  Not having chest pain now.  However with the elevation and unknown stress test that I have do not have access to I think it is reasonable to admit to hospital.  Will discuss with hospitalist and will consult cardiology  Discussed with Dr. Imogene Burn.  He will admit to [facility] and does not need cardiology consult at this time.        Final Clinical Impression(s) / ED Diagnoses Final diagnoses:  Nausea and vomiting, unspecified vomiting type  Hypokalemia  Elevated troponin    Rx / DC Orders ED Discharge Orders     None         Benjiman Core, MD 09/12/22 2208

## 2022-09-12 NOTE — Subjective & Objective (Addendum)
CC: N/V HPI: 52 year old Caucasian female history of prior IV heroin use, now on chronic buprenorphine via pain clinic, hyper tension, history of hypokalemia, chronic alcohol use, presents to the ER today with nausea and vomiting for the last week.  She states that she had a cardiac stress test at [facility] clinic last week.  She states that she walks on a treadmill after getting injected with some kind of medication.  She does not know what it was.  She states that she started having nausea vomiting after getting injection.  She completed her stress test.  She still does not know the results of her test.  She states that she has been having difficulty keeping down her oral potassium chloride tablets.  She has been having chronic nausea and vomiting since then.  She presented to the ER via EMS to today with complaints of the same.  She states that she has been unable to drink any alcohol due to the nausea and vomiting.  She has been unable to take her chronic narcotics the last day or 2 due to nausea and vomiting.  Arrival temp 97.8 heart rate 96 blood pressure 135/95 satting high percent on room air.  Labs UDS was negative for any opiates, amphetamines, cocaine.  Sodium 138, potassium 2.9, BUN of 18, creatinine 1.22 baseline creatinine 0.89  White count of 7.5, hemothirteen 0.6, platelets of 244, MCV of 101  Alcohol less than 10  Lipase normal at 47  CT head negative  EKG shows normal sinus rhythm with downsloping ST segment in the lateral precordium and V4 5 this is similar from her EKG from August 16, 2022  First troponin 56, second troponin 67  Due to positive troponins, [facility] hospitalist contacted for admission.

## 2022-09-12 NOTE — Assessment & Plan Note (Signed)
Start oral keflex

## 2022-09-12 NOTE — ED Triage Notes (Addendum)
Patient BIB [facility] from home. Nausea, vomiting, generalized weakness. Numbness and tingling in all extremities and lips for 4 days. Stopped drinking a few days ago and having involuntary tremors. Did 2 shots every day for "I can't remember how many years." Last drink 4 days ago.

## 2022-09-12 NOTE — Assessment & Plan Note (Signed)
Currently on CIWA protocol.

## 2022-09-12 NOTE — Assessment & Plan Note (Addendum)
Patient states she is she is intolerant to oral potassium.  Will need to change over her hypertension medications to not include her HCTZ as she is unable to take her oral potassium. Replete with IV Kcl.

## 2022-09-12 NOTE — Assessment & Plan Note (Signed)
Chronic. See above.  

## 2022-09-12 NOTE — Assessment & Plan Note (Signed)
Patient's UDS is negative for opiates.  PDMP reviewed.  She does have a valid prescription for buprenorphine from 08/05/2022.  She states that she does not have money right now and she is trying to make her prescriptions last longer.  Will continue with 4 mg of buprenorphine twice a day.

## 2022-09-12 NOTE — Assessment & Plan Note (Signed)
Patient on ACE/HCTZ.  Due to her acute kidney injury, will hold this medication.

## 2022-09-12 NOTE — H&P (Addendum)
History and Physical    [name] JJO:[id number] DOB: 05-17-1971 DOA: 09/12/2022  DOS: the patient was seen and examined on 09/12/2022  PCP: [facility] [place]   Patient coming from: Home  I have personally briefly reviewed patient's old medical records in [place]  CC: N/V HPI: 52 year old Caucasian female history of prior IV heroin use, now on chronic buprenorphine via pain clinic, hyper tension, history of hypokalemia, chronic alcohol use, presents to the ER today with nausea and vomiting for the last week.  She states that she had a cardiac stress test at [facility] clinic last week.  She states that she walks on a treadmill after getting injected with some kind of medication.  She does not know what it was.  She states that she started having nausea vomiting after getting injection.  She completed her stress test.  She still does not know the results of her test.  She states that she has been having difficulty keeping down her oral potassium chloride tablets.  She has been having chronic nausea and vomiting since then.  She presented to the ER via EMS to today with complaints of the same.  She states that she has been unable to drink any alcohol due to the nausea and vomiting.  She has been unable to take her chronic narcotics the last day or 2 due to nausea and vomiting.  Arrival temp 97.8 heart rate 96 blood pressure 135/95 satting high percent on room air.  Labs UDS was negative for any opiates, amphetamines, cocaine.  Sodium 138, potassium 2.9, BUN of 18, creatinine 1.22 baseline creatinine 0.89  White count of 7.5, hemothirteen 0.6, platelets of 244, MCV of 101  Alcohol less than 10  Lipase normal at 47  CT head negative  EKG shows normal sinus rhythm with downsloping ST segment in the lateral precordium and V4 5 this is similar from her EKG from August 16, 2022  First troponin 56, second troponin 67  Due to positive troponins, [facility] contacted  for admission.   ED Course: troponin 56 --> 67. K 2.9, Scr 1.22  Review of Systems:  Review of Systems  Constitutional:  Positive for malaise/fatigue.  HENT: Negative.    Eyes: Negative.   Respiratory: Negative.    Cardiovascular: Negative.   Gastrointestinal:  Positive for nausea and vomiting.  Musculoskeletal:  Positive for back pain and neck pain.  Skin: Negative.   Neurological:  Positive for weakness.  Psychiatric/Behavioral:  Negative for substance abuse.   All other systems reviewed and are negative.   Past Medical History:  Diagnosis Date   Alcohol dependence in sustained full remission    Benign essential hypertension 04/09/2021   Chronic pain syndrome 04/09/2021   Crohn's disease    Dilated cbd, acquired 04/09/2021   Gallstone pancreatitis 04/09/2021   Generalized anxiety disorder    Gout 04/09/2021   History of heroin use    Kidney stones    Major depressive disorder    Obstructive sleep apnea     Past Surgical History:  Procedure Laterality Date   LEEP     LITHOTRIPSY     TUBAL LIGATION       reports that she has been smoking cigarettes. She has been smoking an average of 1 pack per day. She has never used smokeless tobacco. She reports current alcohol use of about 4.0 - 5.0 standard drinks of alcohol per week. She reports that she does not currently use drugs.  Allergies  Allergen  Reactions   Penicillins Hives    Did it involve swelling of the face/tongue/throat, SOB, or low BP? No Did it involve sudden or severe rash/hives, skin peeling, or any reaction on the inside of your mouth or nose? Yes Did you need to seek medical attention at a hospital or doctor's office? Unknown When did it last happen?    childhood   If all above answers are "NO", may proceed with cephalosporin use.      Family History  Problem Relation Age of Onset   Thyroid disease Mother    Dementia Paternal Grandmother     Prior to Admission medications   Medication Sig  Start Date End Date Taking? Authorizing Provider  buprenorphine (SUBUTEX) 8 MG SUBL SL tablet Place 0.5 tablets (4 mg total) under the tongue daily. 09/06/22  Yes   cholecalciferol (VITAMIN D3) 25 MCG (1000 UNIT) tablet Take 1,000 Units by mouth daily.   Yes [provider]  esomeprazole (NEXIUM) 20 MG capsule Take 20 mg by mouth daily at 12 noon.   Yes [provider]  lisinopril-hydrochlorothiazide (ZESTORETIC) 20-25 MG tablet Take 1 tablet by mouth daily 09/06/22  Yes   omeprazole (PRILOSEC) 40 MG capsule Take 1 capsule (40 mg total) by mouth daily. 08/03/22  Yes   ondansetron (ZOFRAN-ODT) 4 MG disintegrating tablet Dissolve 1 tablet (4 mg total) by mouth every 8 (eight) hours as needed for nausea or vomiting. 08/17/22  Yes Carroll Sage, PA-C  oxyCODONE-acetaminophen (PERCOCET/ROXICET) 5-325 MG tablet Take 1 tablet by mouth every 8 (eight) hours as needed for severe pain.   Yes [provider]  potassium chloride (KLOR-CON) 10 MEQ tablet Take 2 tablets by mouth once a day . 08/03/22  Yes Estanislado Emms, MD  traMADol (ULTRAM) 50 MG tablet Take 1 tablet (50 mg total) by mouth 4 (four) times daily as needed. Patient taking differently: Take 50 mg by mouth 4 (four) times daily as needed for moderate pain. 09/06/22  Yes   Blood Pressure Monitoring (BLOOD PRESSURE KIT) KIT 1 (one) kit daily 07/13/22     buprenorphine (SUBUTEX) 8 MG SUBL SL tablet 1/2 each under tongue daily Patient not taking: Reported on 08/17/2022 04/13/22     buprenorphine (SUBUTEX) 8 MG SUBL SL tablet Place 0.5 tablets (4 mg total) under the tongue daily. Patient not taking: Reported on 08/17/2022 05/04/22     buprenorphine (SUBUTEX) 8 MG SUBL SL tablet Place 0.5 tablets (4 mg total) under the tongue daily. Patient not taking: Reported on 08/17/2022 06/01/22     buprenorphine (SUBUTEX) 8 MG SUBL SL tablet Place 0.5 tablets (4 mg total) under the tongue daily. Patient not taking: Reported on 08/17/2022  07/04/22     buprenorphine (SUBUTEX) 8 MG SUBL SL tablet Place 1/2 tablet under each tongue daily Patient not taking: Reported on 09/12/2022 08/03/22     cephALEXin (KEFLEX) 500 MG capsule 1 (one) capsule by mouth two times daily, for UTI Patient not taking: Reported on 08/17/2022 03/15/22     cloNIDine (CATAPRES - DOSED IN MG/24 HR) 0.2 mg/24hr patch Apply 1 patch (0.2 mg total) onto the skin once a week. Patient not taking: Reported on 08/17/2022 10/21/21   Grayce Sessions, NP  doxycycline (VIBRA-TABS) 100 MG tablet Take 1 tablet (100 mg total) by mouth 2 (two) times daily. Patient not taking: Reported on 09/12/2022 08/17/22   Carroll Sage, PA-C  esomeprazole (NEXIUM) 40 MG capsule Take 1 capsule (40 mg total) by mouth daily. Patient  not taking: Reported on 79/89/2119 41/74/08     folic acid (FOLVITE) 1 MG tablet Take 1 tablet (1 mg total) by mouth daily. Patient not taking: Reported on 08/17/2022 04/15/21   Rai, Vernelle Emerald, MD  gabapentin (NEURONTIN) 300 MG capsule Take 1 capsule (300 mg total) by mouth 3 (three) times daily. Patient not taking: Reported on 01/04/2022 04/14/21   Rai, Vernelle Emerald, MD  lisinopril-hydrochlorothiazide (ZESTORETIC) 20-25 MG tablet Take 1 tablet by mouth daily. Patient not taking: Reported on 08/17/2022 07/13/22     lisinopril-hydrochlorothiazide (ZESTORETIC) 20-25 MG tablet Take 1 tablet by mouth daily. Patient not taking: Reported on 09/12/2022 08/08/22     polyethylene glycol (GAVILYTE-C) 240 g solution Use as directe per prep sheet. Patient not taking: Reported on 08/17/2022 08/10/22     Potassium Chloride ER 20 MEQ TBCR Take 1 tablet by mouth daily. Patient not taking: Reported on 08/17/2022 07/26/22     traMADol (ULTRAM) 50 MG tablet 1 (one) tablet by mouth four times daily, as needed Patient not taking: Reported on 08/17/2022 03/15/22     traMADol (ULTRAM) 50 MG tablet Take 1 tablet (50 mg total) by mouth 4 (four) times daily as needed. Patient not  taking: Reported on 08/17/2022 05/04/22     traMADol (ULTRAM) 50 MG tablet Take 1 tablet (50 mg total) by mouth 4 (four) times daily as needed. Patient not taking: Reported on 08/17/2022 07/04/22     traMADol (ULTRAM) 50 MG tablet Take 1 tablet (50 mg total) by mouth 4 (four) times daily as needed. Patient not taking: Reported on 09/12/2022 08/03/22     valsartan-hydrochlorothiazide (DIOVAN-HCT) 160-25 MG tablet Take 1 tablet by mouth daily. Patient not taking: Reported on 08/17/2022 01/04/22   Kerin Perna, NP    Physical Exam: Vitals:   09/12/22 1802 09/12/22 1843 09/12/22 1930 09/12/22 2156  BP: (!) 139/101 (!) 150/104 (!) 154/102 (!) 130/98  Pulse: 83 83 82 78  Resp: 17 16 18 18   Temp: 97.8 F (36.6 C)   97.7 F (36.5 C)  TempSrc: Oral   Oral  SpO2: 100% 100% 100% 100%    Physical Exam Vitals and nursing note reviewed.  Constitutional:      General: She is not in acute distress.    Appearance: She is ill-appearing. She is not toxic-appearing or diaphoretic.     Comments: Chronically ill appearing  HENT:     Head: Normocephalic and atraumatic.     Nose: Nose normal.  Cardiovascular:     Rate and Rhythm: Normal rate and regular rhythm.  Pulmonary:     Effort: Pulmonary effort is normal.     Breath sounds: Normal breath sounds.  Abdominal:     General: Abdomen is flat. Bowel sounds are normal. There is no distension.     Palpations: Abdomen is soft.  Skin:    General: Skin is warm and dry.     Capillary Refill: Capillary refill takes less than 2 seconds.     Comments: Right hand 1st finger, distal PIP with erythema and edema.  Neurological:     General: No focal deficit present.     Mental Status: She is alert and oriented to person, place, and time.      Labs on Admission: I have personally reviewed following labs and imaging studies  CBC: Recent Labs  Lab 09/12/22 1355  WBC 7.5  HGB 13.6  HCT 38.2  MCV 101.3*  PLT 144   Basic Metabolic  Panel: Recent Labs  Lab 09/12/22 1355  NA 138  K 2.9*  CL 97*  CO2 23  GLUCOSE 103*  BUN 18  CREATININE 1.22*  CALCIUM 8.5*   GFR: CrCl cannot be calculated (Unknown ideal weight.). Liver Function Tests: Recent Labs  Lab 09/12/22 1355  AST 40  ALT 20  ALKPHOS 120  BILITOT 1.0  PROT 6.5  ALBUMIN 3.2*   Recent Labs  Lab 09/12/22 1355  LIPASE 47   No results for input(s): "AMMONIA" in the last 168 hours. Coagulation Profile: No results for input(s): "INR", "PROTIME" in the last 168 hours. Cardiac Enzymes: Recent Labs  Lab 09/12/22 1409 09/12/22 2040  TROPONINIHS 56* 67*   BNP (last 3 results) No results for input(s): "PROBNP" in the last 8760 hours. HbA1C: No results for input(s): "HGBA1C" in the last 72 hours. CBG: No results for input(s): "GLUCAP" in the last 168 hours. Lipid Profile: No results for input(s): "CHOL", "HDL", "LDLCALC", "TRIG", "CHOLHDL", "LDLDIRECT" in the last 72 hours. Thyroid Function Tests: No results for input(s): "TSH", "T4TOTAL", "FREET4", "T3FREE", "THYROIDAB" in the last 72 hours. Anemia Panel: No results for input(s): "VITAMINB12", "FOLATE", "FERRITIN", "TIBC", "IRON", "RETICCTPCT" in the last 72 hours. Urine analysis:    Component Value Date/Time   COLORURINE AMBER (A) 08/24/2022 0017   APPEARANCEUR CLEAR 08/24/2022 0017   LABSPEC 1.019 08/24/2022 0017   PHURINE 5.0 08/24/2022 0017   GLUCOSEU NEGATIVE 08/24/2022 0017   HGBUR SMALL (A) 08/24/2022 0017   BILIRUBINUR NEGATIVE 08/24/2022 0017   BILIRUBINUR moderate (A) 03/24/2021 0943   KETONESUR NEGATIVE 08/24/2022 0017   PROTEINUR 30 (A) 08/24/2022 0017   UROBILINOGEN 2.0 (A) 03/24/2021 0943   UROBILINOGEN 0.2 04/24/2009 0504   NITRITE NEGATIVE 08/24/2022 0017   LEUKOCYTESUR SMALL (A) 08/24/2022 0017    Radiological Exams on Admission: I have personally reviewed images CT HEAD WO CONTRAST ( )  Result Date: 09/12/2022 CLINICAL DATA:  Nausea, vomiting, and weakness.  Numbness and tingling in all extremities. Alcohol withdrawal. EXAM: CT HEAD WITHOUT CONTRAST TECHNIQUE: Contiguous axial images were obtained from the base of the skull through the vertex without intravenous contrast. RADIATION DOSE REDUCTION: This exam was performed according to the departmental dose-optimization program which includes automated exposure control, adjustment of the mA and/or kV according to patient size and/or use of iterative reconstruction technique. COMPARISON:  MRI brain dated July 11, 2021. CT head dated October 16, 2007. FINDINGS: Brain: No evidence of acute infarction, hemorrhage, hydrocephalus, extra-axial collection or mass lesion/mass effect. Prominent perivascular space in the left basal ganglia again noted. Vascular: No hyperdense vessel or unexpected calcification. Skull: Normal. Negative for fracture or focal lesion. Sinuses/Orbits: No acute finding. Other: None. IMPRESSION: 1. No acute intracranial abnormality. Electronically Signed   By: Obie Dredge M.D.   On: 09/12/2022 18:28    EKG: My personal interpretation of EKG shows: NSR    Assessment/Plan Principal Problem:   Elevated troponin I level Active Problems:   Opiate withdrawal (HCC) - possible   Chronic pain syndrome   Benign essential hypertension   History of heroin use   Alcohol use   AKI (acute kidney injury) (HCC)   Hypokalemia   Cellulitis of right index finger    Assessment and Plan: * Elevated troponin I level Assigned to observation cardiac telemetry bed.  Will transfer to [facility] due to positive troponins.  Will need to call [facility] medical clinic tomorrow ([address] office) to obtain copy of her stress test.  Will continue to cycle her enzymes.  Start aspirin.  Patient currently chest pain-free. Check echo  Opiate withdrawal (HCC) - possible Patient's UDS is negative for opiates.  PDMP reviewed.  She does have a valid prescription for buprenorphine from 08/05/2022.  She states  that she does not have money right now and she is trying to make her prescriptions last longer.  Will continue with 4 mg of buprenorphine twice a day.  Alcohol use Currently on CIWA protocol.  History of heroin use Stable.  Benign essential hypertension Patient on ACE/HCTZ.  Due to her acute kidney injury, will hold this medication.  Chronic pain syndrome Chronic.  See above.  Cellulitis of right index finger Start oral keflex  Hypokalemia Patient states she is she is intolerant to oral potassium.  Will need to change over her hypertension medications to not include her HCTZ as she is unable to take her oral potassium. Replete with IV Kcl.  AKI (acute kidney injury) (HCC) Continue with IV fluids with LR 100 cc an hour.   DVT prophylaxis: SQ Heparin Code Status: Full Code Family Communication: no family at bedside  Disposition Plan: return home  Consults called: none  Admission status: Observation, Telemetry bed   Carollee Herter, DO [facility] 09/12/2022, 11:14 PM

## 2022-09-12 NOTE — ED Provider Triage Note (Signed)
Emergency Medicine Provider Triage Evaluation Note  [name] , a 52 y.o. female  was evaluated in triage.  Pt complains of and vomiting for the past few days.  Denies any abdominal pain, diarrhea, or constipation.  Patient ports that she recently stopped drinking alcohol and also her symptoms started.  She also ports that she thinks her symptoms are after her stress test as a symptom started then as well.  She has had some generalized weakness with numbness and tingling in all of her extremities and her face for the past 4 days.  She is also having some tremors well. Denies any seizures.   Review of Systems  Positive:  Negative:   Physical Exam  BP (!) 131/95 (BP Location: Left Arm)   Pulse 96   Temp 97.8 F (36.6 C) (Oral)   Resp 17   SpO2 100%  Gen:   Awake, no distress   Resp:  Normal effort  MSK:   Moves extremities without difficulty  Other:  Abdomen nontender to palpation.  Does not appear tremulous to me.  Medical Decision Making  Medically screening exam initiated at 2:08 PM.  Appropriate orders placed.  [name] was informed that the remainder of the evaluation will be completed by another provider, this initial triage assessment does not replace that evaluation, and the importance of remaining in the ED until their evaluation is complete.  Labs and CIWA protocol ordered.    Sherrell Puller, PA-C 09/12/22 1409

## 2022-09-12 NOTE — Assessment & Plan Note (Signed)
Continue with IV fluids with LR 100 cc an hour.

## 2022-09-12 NOTE — Assessment & Plan Note (Addendum)
Assigned to observation cardiac telemetry bed.  Will transfer to [facility] due to positive troponins.  Will need to call [facility] clinic tomorrow ([address] office) to obtain copy of her stress test.  Will continue to cycle her enzymes.  Start aspirin.  Patient currently chest pain-free. Check echo

## 2022-09-12 NOTE — ED Notes (Signed)
I attempted twice to collect labs and was unsuccessful 

## 2022-09-13 ENCOUNTER — Observation Stay (HOSPITAL_COMMUNITY): Payer: Medicaid Other

## 2022-09-13 DIAGNOSIS — L03011 Cellulitis of right finger: Secondary | ICD-10-CM | POA: Diagnosis present

## 2022-09-13 DIAGNOSIS — Z8349 Family history of other endocrine, nutritional and metabolic diseases: Secondary | ICD-10-CM | POA: Diagnosis not present

## 2022-09-13 DIAGNOSIS — Z789 Other specified health status: Secondary | ICD-10-CM | POA: Diagnosis not present

## 2022-09-13 DIAGNOSIS — F1190 Opioid use, unspecified, uncomplicated: Secondary | ICD-10-CM | POA: Diagnosis present

## 2022-09-13 DIAGNOSIS — K5090 Crohn's disease, unspecified, without complications: Secondary | ICD-10-CM | POA: Diagnosis present

## 2022-09-13 DIAGNOSIS — R7989 Other specified abnormal findings of blood chemistry: Secondary | ICD-10-CM | POA: Diagnosis present

## 2022-09-13 DIAGNOSIS — F329 Major depressive disorder, single episode, unspecified: Secondary | ICD-10-CM | POA: Diagnosis present

## 2022-09-13 DIAGNOSIS — I2489 Other forms of acute ischemic heart disease: Secondary | ICD-10-CM | POA: Diagnosis present

## 2022-09-13 DIAGNOSIS — Z87891 Personal history of nicotine dependence: Secondary | ICD-10-CM | POA: Diagnosis not present

## 2022-09-13 DIAGNOSIS — F1021 Alcohol dependence, in remission: Secondary | ICD-10-CM | POA: Diagnosis present

## 2022-09-13 DIAGNOSIS — G4733 Obstructive sleep apnea (adult) (pediatric): Secondary | ICD-10-CM | POA: Diagnosis present

## 2022-09-13 DIAGNOSIS — G894 Chronic pain syndrome: Secondary | ICD-10-CM | POA: Diagnosis present

## 2022-09-13 DIAGNOSIS — F411 Generalized anxiety disorder: Secondary | ICD-10-CM | POA: Diagnosis present

## 2022-09-13 DIAGNOSIS — E440 Moderate protein-calorie malnutrition: Secondary | ICD-10-CM | POA: Diagnosis present

## 2022-09-13 DIAGNOSIS — E876 Hypokalemia: Secondary | ICD-10-CM | POA: Diagnosis present

## 2022-09-13 DIAGNOSIS — Z681 Body mass index (BMI) 19 or less, adult: Secondary | ICD-10-CM | POA: Diagnosis not present

## 2022-09-13 DIAGNOSIS — F1191 Opioid use, unspecified, in remission: Secondary | ICD-10-CM | POA: Diagnosis not present

## 2022-09-13 DIAGNOSIS — R112 Nausea with vomiting, unspecified: Secondary | ICD-10-CM | POA: Diagnosis present

## 2022-09-13 DIAGNOSIS — Z880 Allergy status to penicillin: Secondary | ICD-10-CM | POA: Diagnosis not present

## 2022-09-13 DIAGNOSIS — N179 Acute kidney failure, unspecified: Secondary | ICD-10-CM | POA: Diagnosis present

## 2022-09-13 DIAGNOSIS — G3184 Mild cognitive impairment, so stated: Secondary | ICD-10-CM | POA: Diagnosis present

## 2022-09-13 DIAGNOSIS — Y900 Blood alcohol level of less than 20 mg/100 ml: Secondary | ICD-10-CM | POA: Diagnosis present

## 2022-09-13 DIAGNOSIS — I10 Essential (primary) hypertension: Secondary | ICD-10-CM

## 2022-09-13 DIAGNOSIS — G9341 Metabolic encephalopathy: Secondary | ICD-10-CM | POA: Diagnosis present

## 2022-09-13 DIAGNOSIS — Z79899 Other long term (current) drug therapy: Secondary | ICD-10-CM | POA: Diagnosis not present

## 2022-09-13 LAB — VITAMIN B12: Vitamin B-12: 678 pg/mL (ref 180–914)

## 2022-09-13 LAB — TROPONIN I (HIGH SENSITIVITY)
Troponin I (High Sensitivity): 70 ng/L — ABNORMAL HIGH (ref <18)
Troponin I (High Sensitivity): 52 ng/L — ABNORMAL HIGH (ref <18)

## 2022-09-13 LAB — CBC WITH DIFFERENTIAL/PLATELET
Abs Immature Granulocytes: 0.02 10*3/uL (ref 0.00–0.07)
Basophils Absolute: 0 10*3/uL (ref 0.0–0.1)
Basophils Relative: 0 %
Eosinophils Absolute: 0.1 10*3/uL (ref 0.0–0.5)
Eosinophils Relative: 1 %
HCT: 33.5 % — ABNORMAL LOW (ref 36.0–46.0)
Hemoglobin: 11.5 g/dL — ABNORMAL LOW (ref 12.0–15.0)
Immature Granulocytes: 0 %
Lymphocytes Relative: 26 %
Lymphs Abs: 2 10*3/uL (ref 0.7–4.0)
MCH: 35.6 pg — ABNORMAL HIGH (ref 26.0–34.0)
MCHC: 34.3 g/dL (ref 30.0–36.0)
MCV: 103.7 fL — ABNORMAL HIGH (ref 80.0–100.0)
Monocytes Absolute: 0.4 10*3/uL (ref 0.1–1.0)
Monocytes Relative: 6 %
Neutro Abs: 5.2 10*3/uL (ref 1.7–7.7)
Neutrophils Relative %: 67 %
Platelets: 234 10*3/uL (ref 150–400)
RBC: 3.23 MIL/uL — ABNORMAL LOW (ref 3.87–5.11)
RDW: 13.1 % (ref 11.5–15.5)
WBC: 7.7 10*3/uL (ref 4.0–10.5)
nRBC: 0 % (ref 0.0–0.2)

## 2022-09-13 LAB — COMPREHENSIVE METABOLIC PANEL
ALT: 26 U/L (ref 0–44)
AST: 76 U/L — ABNORMAL HIGH (ref 15–41)
Albumin: 2.6 g/dL — ABNORMAL LOW (ref 3.5–5.0)
Alkaline Phosphatase: 132 U/L — ABNORMAL HIGH (ref 38–126)
Anion gap: 8 (ref 5–15)
BUN: 20 mg/dL (ref 6–20)
CO2: 26 mmol/L (ref 22–32)
Calcium: 8.2 mg/dL — ABNORMAL LOW (ref 8.9–10.3)
Chloride: 101 mmol/L (ref 98–111)
Creatinine, Ser: 1.06 mg/dL — ABNORMAL HIGH (ref 0.44–1.00)
GFR, Estimated: >60 mL/min (ref >60)
Glucose, Bld: 103 mg/dL — ABNORMAL HIGH (ref 70–99)
Potassium: 3 mmol/L — ABNORMAL LOW (ref 3.5–5.1)
Sodium: 135 mmol/L (ref 135–145)
Total Bilirubin: 1.2 mg/dL (ref 0.3–1.2)
Total Protein: 5.6 g/dL — ABNORMAL LOW (ref 6.5–8.1)

## 2022-09-13 LAB — LIPID PANEL
Cholesterol: 138 mg/dL (ref 0–200)
HDL: 44 mg/dL (ref >40)
LDL Cholesterol: 77 mg/dL (ref 0–99)
Total CHOL/HDL Ratio: 3.1 RATIO
Triglycerides: 87 mg/dL (ref <150)
VLDL: 17 mg/dL (ref 0–40)

## 2022-09-13 LAB — ECHOCARDIOGRAM COMPLETE
Area-P 1/2: 3.21 cm2
Calc EF: 62.7 %
S' Lateral: 2 cm
Single Plane A2C EF: 64.4 %
Single Plane A4C EF: 62.1 %

## 2022-09-13 LAB — MAGNESIUM: Magnesium: 1.2 mg/dL — ABNORMAL LOW (ref 1.7–2.4)

## 2022-09-13 MED ORDER — POTASSIUM CHLORIDE CRYS ER 20 MEQ PO TBCR
40.0000 meq | EXTENDED_RELEASE_TABLET | ORAL | Status: AC
  Administered 2022-09-13 (×2): 40 meq via ORAL
  Filled 2022-09-13 (×2): qty 2

## 2022-09-13 MED ORDER — ACETAMINOPHEN 325 MG PO TABS
650.0000 mg | ORAL_TABLET | Freq: Four times a day (QID) | ORAL | Status: DC | PRN
  Administered 2022-09-13 – 2022-09-15 (×4): 650 mg via ORAL
  Filled 2022-09-13 (×3): qty 2

## 2022-09-13 MED ORDER — ACETAMINOPHEN 650 MG RE SUPP: 650.0000 mg | Freq: Four times a day (QID) | RECTAL | Status: DC | PRN

## 2022-09-13 MED ORDER — BUPRENORPHINE HCL 2 MG SL SUBL
4.0000 mg | SUBLINGUAL_TABLET | Freq: Every day | SUBLINGUAL | Status: DC
  Administered 2022-09-14 – 2022-09-19 (×6): 4 mg via SUBLINGUAL
  Filled 2022-09-13 (×6): qty 2

## 2022-09-13 MED ORDER — HEPARIN SODIUM (PORCINE) 5000 UNIT/ML IJ SOLN: 5000.0000 [IU] | Freq: Three times a day (TID) | INTRAMUSCULAR | Status: DC

## 2022-09-13 MED ORDER — HEPARIN SODIUM (PORCINE) 5000 UNIT/ML IJ SOLN
5000.0000 [IU] | Freq: Three times a day (TID) | INTRAMUSCULAR | Status: DC
  Administered 2022-09-13 – 2022-09-19 (×19): 5000 [IU] via SUBCUTANEOUS
  Filled 2022-09-13 (×19): qty 1

## 2022-09-13 MED ORDER — BUPRENORPHINE HCL 8 MG SL SUBL
4.0000 mg | SUBLINGUAL_TABLET | Freq: Once | SUBLINGUAL | Status: AC
  Administered 2022-09-13: 4 mg via SUBLINGUAL
  Filled 2022-09-13: qty 1

## 2022-09-13 MED ORDER — PANTOPRAZOLE SODIUM 40 MG PO TBEC
40.0000 mg | DELAYED_RELEASE_TABLET | Freq: Every day | ORAL | Status: DC
  Administered 2022-09-13 – 2022-09-19 (×7): 40 mg via ORAL
  Filled 2022-09-13 (×7): qty 1

## 2022-09-13 MED ORDER — ASPIRIN 81 MG PO TBEC
81.0000 mg | DELAYED_RELEASE_TABLET | Freq: Every day | ORAL | Status: DC
  Administered 2022-09-13 – 2022-09-19 (×7): 81 mg via ORAL
  Filled 2022-09-13 (×7): qty 1

## 2022-09-13 MED ORDER — ONDANSETRON HCL 4 MG/2ML IJ SOLN
4.0000 mg | Freq: Four times a day (QID) | INTRAMUSCULAR | Status: DC | PRN
  Administered 2022-09-16 – 2022-09-19 (×5): 4 mg via INTRAVENOUS
  Filled 2022-09-13 (×7): qty 2

## 2022-09-13 MED ORDER — ONDANSETRON HCL 4 MG PO TABS
4.0000 mg | ORAL_TABLET | Freq: Four times a day (QID) | ORAL | Status: DC | PRN
  Administered 2022-09-19: 4 mg via ORAL
  Filled 2022-09-13 (×2): qty 1

## 2022-09-13 MED ORDER — NICOTINE 14 MG/24HR TD PT24
14.0000 mg | MEDICATED_PATCH | Freq: Every day | TRANSDERMAL | Status: DC
  Administered 2022-09-14 – 2022-09-19 (×6): 14 mg via TRANSDERMAL
  Filled 2022-09-13 (×6): qty 1

## 2022-09-13 NOTE — Progress Notes (Signed)
PT Cancellation Note  Patient Details Name: [name] MRN: [id number] DOB: 08-20-1971   Cancelled Treatment:    Reason Eval/Treat Not Completed: Patient not medically ready, will check back in AM for evaluation. [place] Office [phone] Weekend pager-[phone]    Claretha Cooper 09/13/2022, 3:34 PM

## 2022-09-13 NOTE — Hospital Course (Signed)
[name] is a 52 y.o. female with a history of heroin use on chronic buprenorphine, hypertension, alcohol use. Patient presented secondary to nausea/vomiting in addition to chest pain. Chest pain workup started. Patient also with generalized weakness.

## 2022-09-13 NOTE — Progress Notes (Signed)
PROGRESS NOTE    [name]  YQM:[id number] DOB: [birth year] DOA: 09/12/2022 [facility]: [facility]   Brief Narrative: [name] is a 52 y.o. female with a history of heroin use on chronic buprenorphine, hypertension, alcohol use. Patient presented secondary to nausea/vomiting in addition to chest pain. Chest pain workup started. Patient also with generalized weakness.   Assessment and Plan:  Elevated troponin Patient reports history of chest pain with exertion. No current chest pain at rest. EKG significant for sinus rhythm and is unchanged from baseline. Mildly elevated Troponin  55 > 67 > 70 > 52. No current chest pain. -Follow-up Transthoracic Echocardiogram; if unremarkable, patient can stay at [facility] -Follow-up outpatient stress test results (order placed)  Paraesthesia Bilateral of lower face, upper/lower extremities. No significant focal deficits, concerning for stroke. Possible vitamin deficiency. -Check Vitamin B1 and B12  AKI Baseline creatinine of 0.7-0.9. Creatinine of 1.22 on admission IV fluids given. Creatinine improved to 1.06 today.  Alcohol use -Continue CIWA  Primary hypertension Patient is on lisinopril and hydrochlorothiazide as an outpatient, which were held on admission.  Cellulitis of right index finger Patient started on Keflex -Continue Keflex -If worsening, or concern for abscess development, will need hand consult  Hypokalemia Potassium of 3.0 today.  -Potassium supplementation -BMP in AM  Hypomagnesemia -Magnesium supplementation  Chronic pain syndrome -Continue buprenorphine daily  History of heroin use Noted. UDS negative.  DVT prophylaxis: Heparin subq Code Status:   Code Status: Full Code Family Communication: None at bedside Disposition Plan: Discharge likely in 24 hours pending Transthoracic Echocardiogram, improvement of electrolytes and PT/OT recommendations   Consultants:  None  Procedures:   Transthoracic Echocardiogram (pending)  Antimicrobials: None    Subjective: Patient reports some substernal chest pain overnight. Generally, pain is worse with exertion and improves with rest. She notes issues with numbness located in bilateral jaw distribution, bilateral arms and bilateral thighs and sides of legs. No associated back/neck pain. Afebrile.  Objective: BP (!) 135/97   Pulse 70   Temp 97.9 F (36.6 C) (Oral)   Resp 16   SpO2 100%   Examination:  General exam: Appears calm and comfortable. Slightly unkempt. Respiratory system: Clear to auscultation. Respiratory effort normal. Cardiovascular system: S1 & S2 heard, RRR. Gastrointestinal system: Abdomen is nondistended, soft and nontender. Normal bowel sounds heard. Central nervous system: Alert and oriented. Right  UE is 4/5 strength compared to 5/5 on left Musculoskeletal: No edema. No calf tenderness. No reproducible chest pain. Skin: No cyanosis. No rashes Psychiatry: Judgement and insight appear normal. Mood & affect appropriate.    Data Reviewed: I have personally reviewed following labs and imaging studies  CBC Lab Results  Component Value Date   WBC 7.5 09/12/2022   RBC 3.77 (L) 09/12/2022   HGB 13.6 09/12/2022   HCT 38.2 09/12/2022   MCV 101.3 (H) 09/12/2022   MCH 36.1 (H) 09/12/2022   PLT 244 09/12/2022   MCHC 35.6 09/12/2022   RDW 13.0 09/12/2022   LYMPHSABS 1.6 08/24/2022   MONOABS 0.7 08/24/2022   EOSABS 0.0 08/24/2022   BASOSABS 0.0 52/84/1324     Last metabolic panel Lab Results  Component Value Date   NA 138 09/12/2022   K 2.9 (L) 09/12/2022   CL 97 (L) 09/12/2022   CO2 23 09/12/2022   BUN 18 09/12/2022   CREATININE 1.22 (H) 09/12/2022   GLUCOSE 103 (H) 09/12/2022   GFRNONAA 53 (L) 09/12/2022   GFRAA  05/15/2008    >  60        The eGFR has been calculated using the MDRD equation. This calculation has not been validated in all clinical   CALCIUM 8.5 (L) 09/12/2022   PHOS  4.3 04/10/2021   PROT 6.5 09/12/2022   ALBUMIN 3.2 (L) 09/12/2022   LABGLOB 3.0 01/04/2022   AGRATIO 1.6 01/04/2022   BILITOT 1.0 09/12/2022   ALKPHOS 120 09/12/2022   AST 40 09/12/2022   ALT 20 09/12/2022   ANIONGAP 18 (H) 09/12/2022    GFR: CrCl cannot be calculated (Unknown ideal weight.).  No results found for this or any previous visit (from the past 240 hour(s)).    Radiology Studies: CT HEAD WO CONTRAST (5MM)  Result Date: 09/12/2022 CLINICAL DATA:  Nausea, vomiting, and weakness. Numbness and tingling in all extremities. Alcohol withdrawal. EXAM: CT HEAD WITHOUT CONTRAST TECHNIQUE: Contiguous axial images were obtained from the base of the skull through the vertex without intravenous contrast. RADIATION DOSE REDUCTION: This exam was performed according to the departmental dose-optimization program which includes automated exposure control, adjustment of the mA and/or kV according to patient size and/or use of iterative reconstruction technique. COMPARISON:  MRI brain dated July 11, 2021. CT head dated October 16, 2007. FINDINGS: Brain: No evidence of acute infarction, hemorrhage, hydrocephalus, extra-axial collection or mass lesion/mass effect. Prominent perivascular space in the left basal ganglia again noted. Vascular: No hyperdense vessel or unexpected calcification. Skull: Normal. Negative for fracture or focal lesion. Sinuses/Orbits: No acute finding. Other: None. IMPRESSION: 1. No acute intracranial abnormality. Electronically Signed   By: Titus Dubin M.D.   On: 09/12/2022 18:28      LOS: 0 days    Cordelia Poche, MD [facility] 09/13/2022, 9:46 AM   If 7PM-7AM, please contact night-coverage [email or web address]

## 2022-09-13 NOTE — ED Notes (Signed)
ED TO INPATIENT HANDOFF REPORT  ED Nurse Name and Phone #: Kathlene November [phone]  S Name/Age/Gender [name] 52 y.o. female Room/Bed: WA17/WA17  Code Status   Code Status: Full Code  Home/SNF/Other Home Patient oriented to: self, place, time, and situation Is this baseline? Yes   Triage Complete: Triage complete  Chief Complaint Elevated troponin I level [R79.89] Elevated troponin [R79.89]  Triage Note Patient BIB [facility] from home. Nausea, vomiting, generalized weakness. Numbness and tingling in all extremities and lips for 4 days. Stopped drinking a few days ago and having involuntary tremors. Did 2 shots every day for "I can't remember how many years." Last drink 4 days ago.    Allergies Allergies  Allergen Reactions   Penicillins Hives    Did it involve swelling of the face/tongue/throat, SOB, or low BP? No Did it involve sudden or severe rash/hives, skin peeling, or any reaction on the inside of your mouth or nose? Yes Did you need to seek medical attention at a hospital or doctor's office? Unknown When did it last happen?    childhood   If all above answers are "NO", may proceed with cephalosporin use.      Level of Care/Admitting Diagnosis ED Disposition     ED Disposition  Admit   Condition  --   Comment  Hospital Area: [facility] [[id number]]  Level of Care: Telemetry [5]  Admit to tele based on following criteria: Monitor for Ischemic changes  May admit patient to [facility] or [facility] if equivalent level of care is available:: No  Covid Evaluation: Asymptomatic - no recent exposure (last 10 days) testing not required  Diagnosis: Elevated troponin [321909]  Admitting Physician: Narda Bonds [phone]  Attending Physician: Narda Bonds [phone]  Certification:: I certify this patient will need inpatient services for at least 2 midnights  Estimated Length of Stay: 2          B Medical/Surgery History Past Medical History:   Diagnosis Date   Alcohol dependence in sustained full remission    Benign essential hypertension 04/09/2021   Chronic pain syndrome 04/09/2021   Crohn's disease    Dilated cbd, acquired 04/09/2021   Gallstone pancreatitis 04/09/2021   Generalized anxiety disorder    Gout 04/09/2021   History of heroin use    Kidney stones    Major depressive disorder    Obstructive sleep apnea    Past Surgical History:  Procedure Laterality Date   LEEP     LITHOTRIPSY     TUBAL LIGATION       A IV Location/Drains/Wounds Patient Lines/Drains/Airways Status     Active Line/Drains/Airways     Name Placement date Placement time Site Days   Peripheral IV 09/12/22 20 G Anterior;Left Forearm 09/12/22  2300  Forearm  1            Intake/Output Last 24 hours No intake or output data in the 24 hours ending 09/13/22 1745  Labs/Imaging Results for orders placed or performed during the hospital encounter of 09/12/22 (from the past 48 hour(s))  Lipase, blood     Status: None   Collection Time: 09/12/22  1:55 PM  Result Value Ref Range   Lipase 47 11 - 51 U/L    Comment: Performed at [facility], [address], [place], [place] [address]  Comprehensive metabolic panel     Status: Abnormal   Collection Time: 09/12/22  1:55 PM  Result Value Ref Range  Sodium 138 135 - 145 mmol/L   Potassium 2.9 (L) 3.5 - 5.1 mmol/L   Chloride 97 (L) 98 - 111 mmol/L   CO2 23 22 - 32 mmol/L   Glucose, Bld 103 (H) 70 - 99 mg/dL    Comment: Glucose reference range applies only to samples taken after fasting for at least 8 hours.   BUN 18 6 - 20 mg/dL   Creatinine, Ser 1.611.22 (H) 0.44 - 1.00 mg/dL   Calcium 8.5 (L) 8.9 - 10.3 mg/dL   Total Protein 6.5 6.5 - 8.1 g/dL   Albumin 3.2 (L) 3.5 - 5.0 g/dL   AST 40 15 - 41 U/L   ALT 20 0 - 44 U/L   Alkaline Phosphatase 120 38 - 126 U/L   Total Bilirubin 1.0 0.3 - 1.2 mg/dL   GFR, Estimated 53 (L) >60 mL/min    Comment: (NOTE) Calculated using  the CKD-EPI Creatinine Equation (2021)    Anion gap 18 (H) 5 - 15    Comment: Performed at [facility], [address] [place], [place] [address]  CBC     Status: Abnormal   Collection Time: 09/12/22  1:55 PM  Result Value Ref Range   WBC 7.5 4.0 - 10.5 K/uL   RBC 3.77 (L) 3.87 - 5.11 MIL/uL   Hemoglobin 13.6 12.0 - 15.0 g/dL   HCT 54.038.2 98.136.0 - 19.146.0 %   MCV 101.3 (H) 80.0 - 100.0 fL   MCH 36.1 (H) 26.0 - 34.0 pg   MCHC 35.6 30.0 - 36.0 g/dL   RDW 47.813.0 29.511.5 - 62.115.5 %   Platelets 244 150 - 400 K/uL   nRBC 0.0 0.0 - 0.2 %    Comment: Performed at [facility], [address] [place], [place] [address]  Urinalysis, Routine w reflex microscopic     Status: Abnormal   Collection Time: 09/12/22  1:55 PM  Result Value Ref Range   Color, Urine YELLOW YELLOW   APPearance TURBID (A) CLEAR   Specific Gravity, Urine 1.017 1.005 - 1.030   pH 6.0 5.0 - 8.0   Glucose, UA NEGATIVE NEGATIVE mg/dL   Hgb urine dipstick SMALL (A) NEGATIVE   Bilirubin Urine NEGATIVE NEGATIVE   Ketones, ur NEGATIVE NEGATIVE mg/dL   Protein, ur 784100 (A) NEGATIVE mg/dL   Nitrite NEGATIVE NEGATIVE   Leukocytes,Ua MODERATE (A) NEGATIVE   RBC / HPF 11-20 0 - 5 RBC/hpf   WBC, UA >50 (H) 0 - 5 WBC/hpf   Bacteria, UA MANY (A) NONE SEEN   Squamous Epithelial / HPF 0-5 0 - 5 /HPF   WBC Clumps PRESENT    Mucus PRESENT    Non Squamous Epithelial 0-5 (A) NONE SEEN    Comment: Performed at [facility], [address] [place], [place] [address]  Troponin I (High Sensitivity)     Status: Abnormal   Collection Time: 09/12/22  2:09 PM  Result Value Ref Range   Troponin I (High Sensitivity) 56 (H) <18 ng/L    Comment: (NOTE) Elevated high sensitivity troponin I (hsTnI) values and significant  changes across serial measurements may suggest ACS but many other  chronic and acute conditions are known to elevate hsTnI results.  Refer to the "Links" section for chest pain  algorithms and additional  guidance. Performed at [facility], [address] [place], [place] [address]   Ethanol     Status: None   Collection Time: 09/12/22  2:30 PM  Result Value Ref Range   Alcohol, Ethyl (B) <10 <10 mg/dL    Comment: (NOTE) Lowest detectable limit for serum alcohol is 10 mg/dL.  For medical purposes only. Performed at [facility], [address], [place], [place] [address]   Urine rapid drug screen (hosp performed)     Status: None   Collection Time: 09/12/22  7:29 PM  Result Value Ref Range   Opiates NONE DETECTED NONE DETECTED   Cocaine NONE DETECTED NONE DETECTED   Benzodiazepines NONE DETECTED NONE DETECTED   Amphetamines NONE DETECTED NONE DETECTED   Tetrahydrocannabinol NONE DETECTED NONE DETECTED   Barbiturates NONE DETECTED NONE DETECTED    Comment: (NOTE) DRUG SCREEN FOR MEDICAL PURPOSES ONLY.  IF CONFIRMATION IS NEEDED FOR ANY PURPOSE, NOTIFY LAB WITHIN 5 DAYS.  LOWEST DETECTABLE LIMITS FOR URINE DRUG SCREEN Drug Class                     Cutoff (ng/mL) Amphetamine and metabolites    1000 Barbiturate and metabolites    200 Benzodiazepine                 200 Opiates and metabolites        300 Cocaine and metabolites        300 THC                            50 Performed at [facility], [address] [place], [place] [address]   Troponin I (High Sensitivity)     Status: Abnormal   Collection Time: 09/12/22  8:40 PM  Result Value Ref Range   Troponin I (High Sensitivity) 67 (H) <18 ng/L    Comment: (NOTE) Elevated high sensitivity troponin I (hsTnI) values and significant  changes across serial measurements may suggest ACS but many other  chronic and acute conditions are known to elevate hsTnI results.  Refer to the "Links" section for chest pain algorithms and additional  guidance. Performed at [facility], [address], [place], [place] [address]    Lipid panel     Status: None   Collection Time: 09/13/22  1:16 AM  Result Value Ref Range   Cholesterol 138 0 - 200 mg/dL   Triglycerides 87 <782 mg/dL   HDL 44 >42 mg/dL   Total CHOL/HDL Ratio 3.1 RATIO   VLDL 17 0 - 40 mg/dL   LDL Cholesterol 77 0 - 99 mg/dL    Comment:        Total Cholesterol/HDL:CHD Risk Coronary Heart Disease Risk Table                     Men   Women  1/2 Average Risk   3.4   3.3  Average Risk       5.0   4.4  2 X Average Risk   9.6   7.1  3 X Average Risk  23.4   11.0        Use the calculated Patient Ratio above and the CHD Risk Table to determine the patient's CHD Risk.        ATP III CLASSIFICATION (LDL):  <100     mg/dL   Optimal  353-614  mg/dL   Near or Above                    Optimal  130-159  mg/dL   Borderline  160-189  mg/dL   High  >190     mg/dL   Very High Performed at [place] [address], [place], [place] [address]   Troponin I (High Sensitivity)     Status: Abnormal   Collection Time: 09/13/22  1:16 AM  Result Value Ref Range   Troponin I (High Sensitivity) 70 (H) <18 ng/L    Comment: (NOTE) Elevated high sensitivity troponin I (hsTnI) values and significant  changes across serial measurements may suggest ACS but many other  chronic and acute conditions are known to elevate hsTnI results.  Refer to the "Links" section for chest pain algorithms and additional  guidance. Performed at [facility], [place] [address], [place], [place] [address]   Comprehensive metabolic panel     Status: Abnormal   Collection Time: 09/13/22  9:59 AM  Result Value Ref Range   Sodium 135 135 - 145 mmol/L   Potassium 3.0 (L) 3.5 - 5.1 mmol/L   Chloride 101 98 - 111 mmol/L   CO2 26 22 - 32 mmol/L   Glucose, Bld 103 (H) 70 - 99 mg/dL    Comment: Glucose reference range applies only to samples taken after fasting for at least 8 hours.   BUN 20 6 - 20 mg/dL   Creatinine, Ser 1.06 (H) 0.44 - 1.00 mg/dL    Calcium 8.2 (L) 8.9 - 10.3 mg/dL   Total Protein 5.6 (L) 6.5 - 8.1 g/dL   Albumin 2.6 (L) 3.5 - 5.0 g/dL   AST 76 (H) 15 - 41 U/L   ALT 26 0 - 44 U/L   Alkaline Phosphatase 132 (H) 38 - 126 U/L   Total Bilirubin 1.2 0.3 - 1.2 mg/dL   GFR, Estimated >60 >60 mL/min    Comment: (NOTE) Calculated using the CKD-EPI Creatinine Equation (2021)    Anion gap 8 5 - 15    Comment: Performed at [facility], [place] [address], [place], [place] [address]  CBC with Differential/Platelet     Status: Abnormal   Collection Time: 09/13/22  9:59 AM  Result Value Ref Range   WBC 7.7 4.0 - 10.5 K/uL   RBC 3.23 (L) 3.87 - 5.11 MIL/uL   Hemoglobin 11.5 (L) 12.0 - 15.0 g/dL   HCT 33.5 (L) 36.0 - 46.0 %   MCV 103.7 (H) 80.0 - 100.0 fL   MCH 35.6 (H) 26.0 - 34.0 pg   MCHC 34.3 30.0 - 36.0 g/dL   RDW 13.1 11.5 - 15.5 %   Platelets 234 150 - 400 K/uL   nRBC 0.0 0.0 - 0.2 %   Neutrophils Relative % 67 %   Neutro Abs 5.2 1.7 - 7.7 K/uL   Lymphocytes Relative 26 %   Lymphs Abs 2.0 0.7 - 4.0 K/uL   Monocytes Relative 6 %   Monocytes Absolute 0.4 0.1 - 1.0 K/uL   Eosinophils Relative 1 %   Eosinophils Absolute 0.1 0.0 - 0.5 K/uL   Basophils Relative 0 %   Basophils Absolute 0.0 0.0 - 0.1 K/uL   Immature Granulocytes 0 %   Abs Immature Granulocytes 0.02 0.00 - 0.07 K/uL    Comment: Performed at [facility], [place] [address] [place],  [address]  Magnesium     Status: Abnormal   Collection Time: 09/13/22  9:59 AM  Result Value Ref Range   Magnesium 1.2 (L) 1.7 - 2.4 mg/dL    Comment: Performed at [facility], [address] [place], [place] [address]  Vitamin B12     Status: None   Collection Time: 09/13/22  9:59 AM  Result Value Ref Range   Vitamin B-12 678 180 - 914 pg/mL    Comment: (NOTE) This assay is not validated for testing neonatal or myeloproliferative syndrome specimens for Vitamin B12 levels. Performed at [facility], [address] [place], [place] [address]   Troponin I (High Sensitivity)     Status: Abnormal   Collection Time: 09/13/22  9:59 AM  Result Value Ref Range   Troponin I (High Sensitivity) 52 (H) <18 ng/L    Comment: (NOTE) Elevated high sensitivity troponin I (hsTnI) values and significant  changes across serial measurements may suggest ACS but many other  chronic and acute conditions are known to elevate hsTnI results.  Refer to the "Links" section for chest pain algorithms and additional  guidance. Performed at [facility], [address] [place], [place] [address]    ECHOCARDIOGRAM COMPLETE  Result Date: 09/13/2022    ECHOCARDIOGRAM REPORT   Patient Name:   Kristen CleverlyCRISTY D Honeycutt Date of Exam: 09/13/2022 Medical Rec #:  [id number]       Height:       66.0 in Accession #:    [id number]      Weight:       136.6 lb Date of Birth:  1971-02-26       BSA:          1.701 m Patient Age:    51 years        BP:           131/99 mmHg Patient Gender: F               HR:           79 bpm. Exam Location:  Inpatient Procedure: 2D Echo, Cardiac Doppler and Color Doppler Indications:    elevated troponin  History:        Patient has no prior history of Echocardiogram examinations.                 Risk Factors:Hypertension.  Sonographer:    Mike GipLindsey Urban Referring Phys: [phone] ERIC CHEN IMPRESSIONS  1. Left ventricular ejection fraction, by estimation, is 70 to 75%. The left ventricle has hyperdynamic function. The left ventricle has no regional wall motion abnormalities. There is mild left ventricular hypertrophy. Left ventricular diastolic parameters were normal.  2. Right ventricular systolic function is normal. The right ventricular size is normal.  3. The mitral valve is normal in structure. Trivial mitral valve regurgitation.  4. The aortic valve is tricuspid. Aortic valve regurgitation is not visualized.  5. The inferior vena cava is normal in size with greater than 50% respiratory  variability, suggesting right atrial pressure of 3 mmHg. FINDINGS  Left Ventricle: Left ventricular ejection fraction, by estimation, is 70 to 75%. The left ventricle has hyperdynamic function. The left ventricle has no regional wall motion abnormalities. The left ventricular internal cavity size was normal in size. There is mild left ventricular hypertrophy. Left ventricular diastolic parameters were normal. Right Ventricle: The right ventricular size is normal. Right vetricular wall thickness was not assessed. Right ventricular systolic function is normal. Left Atrium: Left atrial size was normal in size. Right Atrium: Right atrial size was normal in size. Pericardium: There is no evidence of pericardial effusion. Mitral Valve: The mitral valve is normal in structure. Trivial mitral valve regurgitation. Tricuspid Valve: The  tricuspid valve is normal in structure. Tricuspid valve regurgitation is mild. Aortic Valve: The aortic valve is tricuspid. Aortic valve regurgitation is not visualized. Pulmonic Valve: The pulmonic valve was not well visualized. Pulmonic valve regurgitation is not visualized. No evidence of pulmonic stenosis. Aorta: The aortic root and ascending aorta are structurally normal, with no evidence of dilitation. Venous: The inferior vena cava is normal in size with greater than 50% respiratory variability, suggesting right atrial pressure of 3 mmHg. IAS/Shunts: No atrial level shunt detected by color flow Doppler.  LEFT VENTRICLE PLAX 2D LVIDd:         4.10 cm     Diastology LVIDs:         2.00 cm     LV e' medial:    4.38 cm/s LV PW:         1.20 cm     LV E/e' medial:  20.7 LV IVS:        1.20 cm     LV e' lateral:   4.73 cm/s LVOT diam:     2.00 cm     LV E/e' lateral: 19.2 LV SV:         68 LV SV Index:   40 LVOT Area:     3.14 cm  LV Volumes (MOD) LV vol d, MOD A2C: 69.9 ml LV vol d, MOD A4C: 83.4 ml LV vol s, MOD A2C: 24.9 ml LV vol s, MOD A4C: 31.6 ml LV SV MOD A2C:     45.0 ml LV SV MOD  A4C:     83.4 ml LV SV MOD BP:      48.6 ml RIGHT VENTRICLE             IVC RV Basal diam:  3.40 cm     IVC diam: 1.70 cm RV S prime:     13.30 cm/s TAPSE (M-mode): 1.9 cm LEFT ATRIUM             Index        RIGHT ATRIUM           Index LA diam:        2.35 cm 1.38 cm/m   RA Area:     13.00 cm LA Vol (A2C):   38.1 ml 22.40 ml/m  RA Volume:   28.40 ml  16.70 ml/m LA Vol (A4C):   24.1 ml 14.17 ml/m LA Biplane Vol: 32.4 ml 19.05 ml/m  AORTIC VALVE LVOT Vmax:   110.00 cm/s LVOT Vmean:  77.700 cm/s LVOT VTI:    0.217 m  AORTA Ao Root diam: 3.30 cm Ao Asc diam:  3.20 cm MITRAL VALVE MV Area (PHT): 3.21 cm    SHUNTS MV Decel Time: 236 msec    Systemic VTI:  0.22 m MV E velocity: 90.60 cm/s  Systemic Diam: 2.00 cm MV A velocity: 97.90 cm/s MV E/A ratio:  0.93 Dorris Carnes MD Electronically signed by Dorris Carnes MD Signature Date/Time: 09/13/2022/3:18:34 PM    Final    CT HEAD WO CONTRAST (5MM)  Result Date: 09/12/2022 CLINICAL DATA:  Nausea, vomiting, and weakness. Numbness and tingling in all extremities. Alcohol withdrawal. EXAM: CT HEAD WITHOUT CONTRAST TECHNIQUE: Contiguous axial images were obtained from the base of the skull through the vertex without intravenous contrast. RADIATION DOSE REDUCTION: This exam was performed according to the departmental dose-optimization program which includes automated exposure control, adjustment of the mA and/or kV according to patient size and/or use of iterative reconstruction technique. COMPARISON:  MRI  brain dated July 11, 2021. CT head dated October 16, 2007. FINDINGS: Brain: No evidence of acute infarction, hemorrhage, hydrocephalus, extra-axial collection or mass lesion/mass effect. Prominent perivascular space in the left basal ganglia again noted. Vascular: No hyperdense vessel or unexpected calcification. Skull: Normal. Negative for fracture or focal lesion. Sinuses/Orbits: No acute finding. Other: None. IMPRESSION: 1. No acute intracranial abnormality.  Electronically Signed   By: Obie Dredge M.D.   On: 09/12/2022 18:28    Pending Labs Unresulted Labs (From admission, onward)     Start     Ordered   09/14/22 0500  CBC  Tomorrow morning,   R        09/13/22 1639   09/14/22 0500  Basic metabolic panel  Tomorrow morning,   R        09/13/22 1639   09/13/22 1201  HIV Antibody (routine testing w rflx)  Once,   R        09/13/22 1201   09/13/22 0948  Vitamin B1  Once,   R        09/13/22 0948            Vitals/Pain Today's Vitals   09/13/22 1500 09/13/22 1530 09/13/22 1600 09/13/22 1740  BP: (!) 136/93 (!) 151/93 (!) 138/94 (!) 162/102  Pulse: 77 86 77 85  Resp: 18 19 18 14   Temp:    97.8 F (36.6 C)  TempSrc:      SpO2: 97% 98% 95% 100%  PainSc:        Isolation Precautions No active isolations  Medications Medications  LORazepam (ATIVAN) tablet 1 mg (1 mg Oral Not Given 09/12/22 1915)  LORazepam (ATIVAN) tablet 1-4 mg (has no administration in time range)    Or  LORazepam (ATIVAN) tablet 1 mg (has no administration in time range)  thiamine (VITAMIN B1) tablet 100 mg (100 mg Oral Given 09/13/22 1025)    Or  thiamine (VITAMIN B1) injection 100 mg ( Intravenous See Alternative 09/13/22 1025)  folic acid (FOLVITE) tablet 1 mg (1 mg Oral Given 09/13/22 1025)  multivitamin with minerals tablet 1 tablet (1 tablet Oral Given 09/13/22 1025)  lactated ringers infusion (0 mLs Intravenous Stopped 09/13/22 0733)  cephALEXin (KEFLEX) capsule 500 mg (500 mg Oral Given 09/13/22 1025)  acetaminophen (TYLENOL) tablet 650 mg (has no administration in time range)    Or  acetaminophen (TYLENOL) suppository 650 mg (has no administration in time range)  ondansetron (ZOFRAN) tablet 4 mg (has no administration in time range)    Or  ondansetron (ZOFRAN) injection 4 mg (has no administration in time range)  nicotine (NICODERM CQ - dosed in mg/24 hours) patch 14 mg (14 mg Transdermal Not Given 09/13/22 1025)  aspirin EC tablet 81 mg (81 mg Oral  Given 09/13/22 1025)  pantoprazole (PROTONIX) EC tablet 40 mg (40 mg Oral Given 09/13/22 1025)  heparin injection 5,000 Units (5,000 Units Subcutaneous Given 09/13/22 1358)  buprenorphine (SUBUTEX) sublingual tablet 4 mg (has no administration in time range)  potassium chloride SA (KLOR-CON M) CR tablet 40 mEq (40 mEq Oral Given 09/13/22 1559)  sodium chloride 0.9 % bolus 1,000 mL (0 mLs Intravenous Stopped 09/12/22 1915)  ondansetron (ZOFRAN) injection 4 mg (4 mg Intravenous Given 09/12/22 1722)  potassium chloride 10 mEq in 100 mL IVPB (0 mEq Intravenous Stopped 09/12/22 2156)  morphine (PF) 4 MG/ML injection 4 mg (4 mg Intravenous Given 09/12/22 2020)  potassium chloride 10 mEq in 100 mL IVPB (0 mEq Intravenous Stopped  09/13/22 0304)  aspirin tablet 325 mg (325 mg Oral Given 09/12/22 2336)  buprenorphine (SUBUTEX) sublingual tablet 4 mg (4 mg Sublingual Given 09/13/22 0331)    Mobility walks     Focused Assessments Neuro Assessment Handoff:  Swallow screen pass? Yes          Neuro Assessment: Exceptions to WDL Neuro Checks:      Has TPA been given? No If patient is a Neuro Trauma and patient is going to OR before floor call report to 4N Charge nurse: [phone] or [phone]  , Pulmonary Assessment Handoff:  Lung sounds:   O2 Device: Room Air      R Recommendations: See Admitting Provider Note  Report given to:   Additional Notes: .

## 2022-09-13 NOTE — Progress Notes (Signed)
Echocardiogram 2D Echocardiogram has been performed.  Frances Furbish 09/13/2022, 2:44 PM

## 2022-09-14 ENCOUNTER — Other Ambulatory Visit: Payer: Self-pay

## 2022-09-14 DIAGNOSIS — R7989 Other specified abnormal findings of blood chemistry: Secondary | ICD-10-CM | POA: Diagnosis not present

## 2022-09-14 DIAGNOSIS — R112 Nausea with vomiting, unspecified: Secondary | ICD-10-CM

## 2022-09-14 DIAGNOSIS — G9341 Metabolic encephalopathy: Secondary | ICD-10-CM | POA: Diagnosis not present

## 2022-09-14 DIAGNOSIS — Z789 Other specified health status: Secondary | ICD-10-CM | POA: Diagnosis not present

## 2022-09-14 LAB — BASIC METABOLIC PANEL
Anion gap: 8 (ref 5–15)
BUN: 23 mg/dL — ABNORMAL HIGH (ref 6–20)
CO2: 26 mmol/L (ref 22–32)
Calcium: 8.6 mg/dL — ABNORMAL LOW (ref 8.9–10.3)
Chloride: 104 mmol/L (ref 98–111)
Creatinine, Ser: 1.11 mg/dL — ABNORMAL HIGH (ref 0.44–1.00)
GFR, Estimated: 60 mL/min — ABNORMAL LOW (ref >60)
Glucose, Bld: 93 mg/dL (ref 70–99)
Potassium: 5 mmol/L (ref 3.5–5.1)
Sodium: 138 mmol/L (ref 135–145)

## 2022-09-14 LAB — HIV ANTIBODY (ROUTINE TESTING W REFLEX): HIV Screen 4th Generation wRfx: NONREACTIVE

## 2022-09-14 LAB — TSH: TSH: 2.344 u[IU]/mL (ref 0.350–4.500)

## 2022-09-14 LAB — HEPATIC FUNCTION PANEL
ALT: 49 U/L — ABNORMAL HIGH (ref 0–44)
AST: 81 U/L — ABNORMAL HIGH (ref 15–41)
Albumin: 2.7 g/dL — ABNORMAL LOW (ref 3.5–5.0)
Alkaline Phosphatase: 185 U/L — ABNORMAL HIGH (ref 38–126)
Bilirubin, Direct: 0.3 mg/dL — ABNORMAL HIGH (ref 0.0–0.2)
Indirect Bilirubin: 0.4 mg/dL (ref 0.3–0.9)
Total Bilirubin: 0.7 mg/dL (ref 0.3–1.2)
Total Protein: 5.8 g/dL — ABNORMAL LOW (ref 6.5–8.1)

## 2022-09-14 LAB — AMMONIA: Ammonia: 30 umol/L (ref 9–35)

## 2022-09-14 LAB — CBC
HCT: 30.4 % — ABNORMAL LOW (ref 36.0–46.0)
Hemoglobin: 10.4 g/dL — ABNORMAL LOW (ref 12.0–15.0)
MCH: 35.4 pg — ABNORMAL HIGH (ref 26.0–34.0)
MCHC: 34.2 g/dL (ref 30.0–36.0)
MCV: 103.4 fL — ABNORMAL HIGH (ref 80.0–100.0)
Platelets: 222 10*3/uL (ref 150–400)
RBC: 2.94 MIL/uL — ABNORMAL LOW (ref 3.87–5.11)
RDW: 12.8 % (ref 11.5–15.5)
WBC: 4.8 10*3/uL (ref 4.0–10.5)
nRBC: 0 % (ref 0.0–0.2)

## 2022-09-14 LAB — MAGNESIUM: Magnesium: 1.2 mg/dL — ABNORMAL LOW (ref 1.7–2.4)

## 2022-09-14 MED ORDER — HYDRALAZINE HCL 20 MG/ML IJ SOLN
5.0000 mg | Freq: Four times a day (QID) | INTRAMUSCULAR | Status: DC | PRN
  Administered 2022-09-14 – 2022-09-15 (×2): 5 mg via INTRAVENOUS
  Filled 2022-09-14 (×2): qty 1

## 2022-09-14 MED ORDER — KETOROLAC TROMETHAMINE 30 MG/ML IJ SOLN
30.0000 mg | Freq: Once | INTRAMUSCULAR | Status: AC
  Administered 2022-09-14: 30 mg via INTRAVENOUS
  Filled 2022-09-14: qty 1

## 2022-09-14 MED ORDER — LORAZEPAM 1 MG PO TABS
1.0000 mg | ORAL_TABLET | ORAL | Status: DC | PRN
  Administered 2022-09-14: 2 mg via ORAL
  Filled 2022-09-14: qty 2

## 2022-09-14 MED ORDER — LORAZEPAM 1 MG PO TABS
1.0000 mg | ORAL_TABLET | Freq: Four times a day (QID) | ORAL | Status: DC | PRN
  Administered 2022-09-14: 1 mg via ORAL
  Filled 2022-09-14: qty 1

## 2022-09-14 MED ORDER — LORAZEPAM 1 MG PO TABS: 1.0000 mg | ORAL_TABLET | ORAL | Status: DC | PRN

## 2022-09-14 MED ORDER — MAGNESIUM SULFATE 2 GM/50ML IV SOLN
2.0000 g | Freq: Once | INTRAVENOUS | Status: AC
  Administered 2022-09-14: 2 g via INTRAVENOUS
  Filled 2022-09-14: qty 50

## 2022-09-14 NOTE — Progress Notes (Addendum)
Progress Note   Patient: [name] YNW:[id number] DOB: Sep 11, 1970 DOA: 09/12/2022     1 DOS: the patient was seen and examined on 09/14/2022   Brief hospital course: [name] is a 52 y.o. female with a history of heroin use on chronic buprenorphine, hypertension, alcohol use and mild cognitive impairment. Patient presented secondary to nausea/vomiting in addition to chest pain. Chest pain workup started showed troponin peak of 70 before downtrending, EKG showed normal sinus rhythm with no acute ischemic changes, and TTE showed preserved EF with no wall motion abnormalities.  Hospital course complicated by ongoing lethargy in the setting of reported nausea, vomiting and paresthesia concerning for acute metabolic encephalopathy.  Patient placed on CIWA protocol earlier in admission.  And undergoing current workup for metabolic derangements including B1, B12, ammonia, TSH.  Of note patient is on buprenorphine for chronic substance use disorder and patient has diagnosis of mild cognitive impairment on neuropsychological evaluation examination as outpatient on 12/01/18/2023.  She reports she underwent a stress test last Wednesday at [facility] and since then she has been having ongoing nausea and vomiting after receiving some intake of medication prior to getting on the treadmill.   Assessment and Plan: Acute metabolic encephalopathy Remains intermittently lethargic in conversation with subjective and objective upper extremity weakness and reported paresthesias.  Concerning for likely metabolic derangement given no acute focal neurologic deficits and  when re-oriented patient follows commands appropriately.  CT head unremarkable, B12 within normal limits.  Unable to cooperate patient's baseline mental status given no family, does have history of mild cognitive impairment per chart review, infectious etiology thought less likely given afebrile with no white count however patient does spike  fever low threshold to obtain LP to rule out any infectious etiology for cognitive symptoms - TSH - Ammonia - Follow-up vitamin B1  - Follow-up hepatic function panel --procal --low threshold for LP if febrile  --Space out frequency of ativan for CIWA protocol given ongoing lethargy  Ongoing nausea vomiting without abdominal pain, currently stable Could be related to potential opiate withdrawal, also consider related to other medicationdenies any abdominal pain so doubt any pancreatitis (lipase within normal limits), less likelyGI pathology, ACS ruled out, also differential includes potential vitamin deficiency or metabolic derangement (UDS negative), neurologically globally intact with no deficits, concern for possible cellulitis of index finger but no signs of systemic involvement with no feer Per admission patient reports not able to fill new prescription, PDMP reviewed shows valid prescription for buprenorphine from 07/1822 - Continue 4 mg Subutex daily --TSH -Hepatic function panel -Ammonia  AKI, likely prerenal in the setting of ongoing nausea, vomiting Patient creatinine 1.7-1, on admission creatinine of 1.22, improved with time and IV fluids - Continue to encourage oral hydration and closely monitor BMP - Not currently needing IV fluids - Holding home ACE   History of alcohol dependence Patient reports less more than a week ago in the setting of recurrent nausea vomiting No obvious signs of alcohol withdrawal. Alcohol level less than 10 on admission - Monitor on CIWA - Decrease Ativan frequency from q1 hours to q6 hrs PRN given ongoing lethargy, closely monitoring continue to resume for withdrawal symptoms though less likely given outside the window for withdrawal    Elevated troponin I level, resolved, non-ACS - Troponin trend negative, TTE with no acute abnormalities - Started on aspirin during initial workup -patient remains chest pain-free  History of heroin use and  chronic pain syndrome Stable  - Continue Subutex  Benign essential hypertension Patient on ACE/HCTZ.   -Due to her acute kidney injury, will hold this medication.   Cellulitis of right index finger Redness at DIP of right index with blanching erythema, no appreciable tenderness but limited due to fluctuating lethargy. - Started on Keflex on admission for presumed cellulitis   Hypokalemia secondary to ongoing nausea and vomiting, improved - Holding home HCTZ Status post IV potassium repletion, current potassium 5 - Repeat BMP in a.m.     Subjective:  Very tearful on exam.  Denies any abdominal pain.  States she has some nausea vomiting overnight.  No current chest pain.  Is to eat today.  States it has been "40 days and 40 nights"  Physical Exam: Vitals:   09/14/22 0037 09/14/22 0518 09/14/22 0858 09/14/22 1308  BP: (!) 167/113 (!) 144/106 (!) 151/96 (!) 161/110  Pulse: 85 71 76 83  Resp: 20 18 18 20   Temp: 98.4 F (36.9 C) 98.2 F (36.8 C) (!) 97.3 F (36.3 C) 98 F (36.7 C)  TempSrc: Oral Oral  Oral  SpO2: 99% 98% 99% 98%  Weight:  57.1 kg    Height:       Sitting up in bed, tearful, no acute distress, falling asleep during examination but with prompts follows commands and answers questions appropriately Regular rate and rhythm, no peripheral edema Normal respiratory effort, no rales, normal breath sounds Abdomen soft, nondistended, nontender, normal bowel sounds 4-5 grip strength in upper extremity strength, normal strength in bilateral lower extremities, no appreciable asterixis Alert to self, place, time and year Data Reviewed:  There are no new results to review at this time.  Family Communication: none  Disposition: Status is: Inpatient Remains inpatient appropriate because: Ongoing workup of acute metabolic encephalopathy, close monitoring of electrolyte derangements in the setting of nausea, vomiting, close monitoring of any infectious etiology.   Planned Discharge Destination:  pending PT eval   DVT prophylaxis-heparin subcu   Author: Desiree Hane, MD 09/14/2022 4:05 PM  For on call review [email or web address].

## 2022-09-14 NOTE — Plan of Care (Signed)
  Problem: Education: Goal: Knowledge of General Education information will improve Description: Including pain rating scale, medication(s)/side effects and non-pharmacologic comfort measures Outcome: Progressing   Problem: Activity: Goal: Risk for activity intolerance will decrease Outcome: Progressing   Problem: Elimination: Goal: Will not experience complications related to urinary retention Outcome: Progressing   Problem: Pain Managment: Goal: General experience of comfort will improve Outcome: Progressing   Problem: Safety: Goal: Ability to remain free from injury will improve Outcome: Progressing   Problem: Skin Integrity: Goal: Risk for impaired skin integrity will decrease Outcome: Progressing   

## 2022-09-14 NOTE — Progress Notes (Signed)
Notified on call provider about patient's elevated BP's: 171/111, 167/113, 144/106.

## 2022-09-14 NOTE — Progress Notes (Signed)
Chaplain engaged in an initial visit with [place].  She was trying to eat her breakfast but was very lethargic and became tearful.  Chaplain assisted her with eating her breakfast by feeding her and cutting her meal into smaller bites.  Danylle expressed her gratefulness for help.  Chaplain worked to find out the source of Shalon's tears and Chaplain assessed that she may have been experiencing some confusion, as well as be in pain.   Chaplain provided a compassionate presence, assistance to eat, community, support, and prayer over her.      09/14/22 1000  Spiritual Encounters  Type of Visit Initial  Care provided to: Patient  Reason for visit Routine spiritual support  Spiritual Framework  Needs/Challenges/Barriers Very lethargic  Patient Stress Factors Exhausted;Loss of control  Interventions  Spiritual Care Interventions Made Established relationship of care and support;Compassionate presence;Normalization of emotions;Encouragement  Intervention Outcomes  Outcomes Awareness of support;Connection to spiritual care  Spiritual Care Plan  Spiritual Care Issues Still Outstanding Bonney Roussel will continue to follow

## 2022-09-14 NOTE — Evaluation (Signed)
Physical Therapy Evaluation Patient Details Name: [name] MRN: [id number] DOB: Apr 09, 1971 Today's Date: 09/14/2022  History of Present Illness  [name] is a 52 yr old female admitted to the hospital with intractable nausea & vomiting for ~1 week. She was found to have elevated troponin, AKI, hypokalemia, possible ETOH withdrawal, and cellulitis of the R index finger. PMH: HTN, substance abuse, ETOH abuse, chronic pain, Crohn's disease, gout  Clinical Impression  Pt admitted with above diagnosis.  Pt currently with functional limitations due to the deficits listed below (see PT Problem List). Pt will benefit from skilled PT to increase their independence and safety with mobility to allow discharge to the venue listed below.     The patient is very lethargic, stating that she needs to get to the BR. Mod assistance to   stand and pivot to and from [facility].  Patient very unsteady.  Patient  di  show increased arousal and asking for menu to order meal.  BP after seated on BSc=171/102, after return to bed 162/99. RN notified.      Recommendations for follow up therapy are one component of a multi-disciplinary discharge planning process, led by the attending physician.  Recommendations may be updated based on patient status, additional functional criteria and insurance authorization.  Follow Up Recommendations Skilled nursing-short term rehab (<3 hours/day) Can patient physically be transported by private vehicle: No    Assistance Recommended at Discharge Frequent or constant Supervision/Assistance  Patient can return home with the following  A lot of help with walking and/or transfers;A lot of help with bathing/dressing/bathroom;Assistance with cooking/housework;Assist for transportation    Equipment Recommendations Rolling walker (2 wheels)  Recommendations for Other Services       Functional Status Assessment Patient has had a recent decline in their functional status and  demonstrates the ability to make significant improvements in function in a reasonable and predictable amount of time.     Precautions / Restrictions Precautions Precautions: Fall Precaution Comments: hi BP      Mobility  Bed Mobility   Bed Mobility: Rolling, Sidelying to Sit, Sit to Supine Rolling: Min assist Sidelying to sit: Mod assist   Sit to supine: Mod assist   General bed mobility comments: assisted to  sit up on bed edge with mod assist , and return to supine    Transfers Overall transfer level: Needs assistance   Transfers: Sit to/from Stand, Bed to chair/wheelchair/BSC Sit to Stand: Mod assist     Squat pivot transfers: Mod assist     General transfer comment: assisted to stand to pul down paants, multimodal cues to reach for [facility] and pivot, then mod to stnad and pull up pants, steady assit, pivot to bed.    Ambulation/Gait                  Stairs            Wheelchair Mobility    Modified Rankin (Stroke Patients Only)       Balance Overall balance assessment: Needs assistance Sitting-balance support: Feet supported, Bilateral upper extremity supported Sitting balance-Leahy Scale: Poor Sitting balance - Comments: leans far forward   Standing balance support: Bilateral upper extremity supported, During functional activity Standing balance-Leahy Scale: Poor                               Pertinent Vitals/Pain Pain Assessment Faces Pain Scale: Hurts whole lot Pain Location: trying to  urinate: I have kidney stones Pain Descriptors / Indicators: Burning, Cramping, Moaning Pain Intervention(s): Monitored during session    Home Living Family/patient expects to be discharged to:: Private residence Living Arrangements: Spouse/significant other   Type of Home: House Home Access: Stairs to enter   [employer] of Steps: 3       Additional Comments: It was very difficult to obtain information regarding the pt's  prior level of functioning and living situation, given her lethargy. As best able to decipher, she reported living her her fiance' in a 2 story home. States he works weekends    Prior Administrator, Civil Service Comments: unsure       Journalist, newspaper        Extremity/Trunk Assessment   Upper Extremity Assessment Upper Extremity Assessment: Generalized weakness    Lower Extremity Assessment Lower Extremity Assessment: Generalized weakness    Cervical / Trunk Assessment Cervical / Trunk Assessment: Other exceptions Cervical / Trunk Exceptions: tends to lean forward  Communication   Communication:  (speach is slurred)  Cognition Arousal/Alertness: Lethargic Behavior During Therapy: Flat affect Overall Cognitive Status: No family/caregiver present to determine baseline cognitive functioning Area of Impairment: Attention, Following commands, Orientation                 Orientation Level: Time, Situation Current Attention Level: Focused   Following Commands: Follows one step commands consistently       General Comments: intermittently aroused  initially, increased arousal and speach cleared at end of session, patient wanting to order  dinner        General Comments      Exercises     Assessment/Plan    PT Assessment Patient needs continued PT services  PT Problem List Decreased strength;Decreased activity tolerance;Decreased mobility;Decreased cognition;Decreased safety awareness;Cardiopulmonary status limiting activity;Decreased balance;Decreased knowledge of use of DME;Decreased knowledge of precautions;Pain       PT Treatment Interventions DME instruction;Therapeutic activities;Balance training;Cognitive remediation;Functional mobility training;Gait training;Therapeutic exercise;Patient/family education    PT Goals (Current goals can be found in the Care Plan section)  Acute Rehab PT Goals PT Goal Formulation: Patient unable to participate in  goal setting Time For Goal Achievement: 09/28/22 Potential to Achieve Goals: Fair    Frequency Min 2X/week     Co-evaluation               AM-PAC PT "6 Clicks" Mobility  Outcome Measure Help needed turning from your back to your side while in a flat bed without using bedrails?: A Lot Help needed moving from lying on your back to sitting on the side of a flat bed without using bedrails?: A Lot Help needed moving to and from a bed to a chair (including a wheelchair)?: A Lot Help needed standing up from a chair using your arms (e.g., wheelchair or bedside chair)?: Total Help needed to walk in hospital room?: Total Help needed climbing 3-5 steps with a railing? : Total 6 Click Score: 9    End of Session Equipment Utilized During Treatment: Gait belt Activity Tolerance: Patient limited by fatigue Patient left: in bed;with bed alarm set;with chair alarm set Nurse Communication: Mobility status PT Visit Diagnosis: Unsteadiness on feet (R26.81);Difficulty in walking, not elsewhere classified (R26.2);Muscle weakness (generalized) (M62.81)    Time: 2025-4270 PT Time Calculation (min) (ACUTE ONLY): 29 min   Charges:   PT Evaluation $PT Eval Low Complexity: 1 Low PT Treatments $Therapeutic Activity: 8-22 mins  Blanchard Kelch PT Acute Rehabilitation Services Office [phone] Weekend pager-[phone]   Rada Hay 09/14/2022, 5:12 PM

## 2022-09-14 NOTE — Progress Notes (Signed)
Occupational Therapy Evaluation Patient Details Name: [name] MRN: [id number] DOB: 02/05/1971 Today's Date: 09/14/2022   History of Present Illness [name] is a 52 yr old female admitted to the hospital with intractable nausea & vomiting for ~1 week. She was found to have elevated troponin, AKI, hypokalemia, possible ETOH withdrawal, and cellulitis of the R index finger. PMH: HTN, substance abuse, ETOH abuse, chronic pain, Crohn's disease, gout   Clinical Impression   The patient was limited by decreased arousal/lethargy, impaired command follow, altered mental status, and impaired initiation. It was very difficult to obtain information regarding the pt's prior level of functioning and living situation, due to her lethargy & cognition; she needed significant verbal and tactile cues for alertness and to keep her eyes open. She also presented with 1 brief instance of tearfulness during the session.  Given her current limitations, attempts at out of bed activity were deferred. She was noted to say, "I feel like I can't wake up."  OT anticipates her overall functional abilities and ADL performance would improve, pending improvement in her alertness. OT will continue to follow the pt for further OT services to maximize her safety and independence with self-care tasks & to decrease the risk for progressive functional decline.       Recommendations for follow up therapy are one component of a multi-disciplinary discharge planning process, led by the attending physician.  Recommendations may be updated based on patient status, additional functional criteria and insurance authorization.   Follow Up Recommendations  Skilled nursing-short term rehab (<3 hours/day)     Assistance Recommended at Discharge Frequent or constant Supervision/Assistance  Patient can return home with the following A lot of help with bathing/dressing/bathroom;Direct supervision/assist for medications management;A lot of  help with walking and/or transfers;Direct supervision/assist for financial management;Assistance with cooking/housework;Assist for transportation;Help with stairs or ramp for entrance;Assistance with feeding    Functional Status Assessment  Patient has had a recent decline in their functional status and demonstrates the ability to make significant improvements in function in a reasonable and predictable amount of time.  Equipment Recommendations  Other (comment) (to be determined pending functional progress)       Precautions / Restrictions Precautions Precautions: Fall Restrictions Weight Bearing Restrictions: No      Mobility Bed Mobility Overal bed mobility: Needs Assistance Bed Mobility: Sit to Supine, Supine to Sit     Supine to sit: Mod assist Sit to supine: Mod assist        Transfers      General transfer comment: attempts deferred, given pt's decreased arousal, impaired command follow, and altered mental status      Balance     Sitting balance-Leahy Scale: Fair           ADL either performed or assessed with clinical judgement   ADL Overall ADL's : Needs assistance/impaired Eating/Feeding: Maximal assistance;Bed level Eating/Feeding Details (indicate cue type and reason): based on clinical judgement; pt at least partially limited by decreased arousal and altered mental status Grooming: Maximal assistance;Bed level Grooming Details (indicate cue type and reason): simulated at bed level; pt at least partially limited by decreased arousal and altered mental status         Upper Body Dressing : Maximal assistance;Bed level   Lower Body Dressing: Bed level;Total assistance                      Pertinent Vitals/Pain Pain Assessment Pain Assessment: Faces Pain Score: 0-No pain  Communication     Cognition Arousal/Alertness: Lethargic Behavior During Therapy: Flat affect Overall Cognitive Status: Difficult to assess Area of  Impairment: Attention, Following commands          General Comments: required significant cues for arousal including to keep her eyes open, brief instance of tearfulness, lethargic, decreased initiation and command follow which appeared to be at least partially due to lethargy and altered mental status; oriented to person & place, however disoriented to time & situation                Home Living Family/patient expects to be discharged to:: Private residence Living Arrangements: Spouse/significant other   Type of Home: House      Additional Comments: It was very difficult to obtain information regarding the pt's prior level of functioning and living situation, given her lethargy. As best able to decipher, she reported living her her fiance' in a 2 story home.              OT Problem List: Decreased strength;Decreased activity tolerance;Impaired balance (sitting and/or standing);Decreased cognition;Decreased knowledge of use of DME or AE      OT Treatment/Interventions: Self-care/ADL training;Therapeutic activities;Therapeutic exercise;Cognitive remediation/compensation;Energy conservation;DME and/or AE instruction;Balance training;Patient/family education    OT Goals(Current goals can be found in the care plan section) Acute Rehab OT Goals OT Goal Formulation: Patient unable to participate in goal setting Time For Goal Achievement: 09/28/22 Potential to Achieve Goals:  (Guarded at current) ADL Goals Pt Will Perform Grooming: with supervision;standing Pt Will Perform Upper Body Dressing: with set-up;sitting Pt Will Perform Lower Body Dressing: with supervision;sit to/from stand Pt Will Transfer to Toilet: with supervision;ambulating Pt Will Perform Toileting - Clothing Manipulation and hygiene: with supervision;sit to/from stand  OT Frequency: Min 2X/week       AM-PAC OT "6 Clicks" Daily Activity     Outcome Measure Help from another person eating meals?: A Lot Help  from another person taking care of personal grooming?: A Lot Help from another person toileting, which includes using toliet, bedpan, or urinal?: A Lot Help from another person bathing (including washing, rinsing, drying)?: A Lot Help from another person to put on and taking off regular upper body clothing?: A Lot Help from another person to put on and taking off regular lower body clothing?: Total 6 Click Score: 11   End of Session Nurse Communication: Mobility status  Activity Tolerance: Patient limited by lethargy Patient left: in bed;with call bell/phone within reach;with bed alarm set  OT Visit Diagnosis: Other symptoms and signs involving cognitive function;Muscle weakness (generalized) (M62.81)                Time: 3662-9476 OT Time Calculation (min): 19 min Charges:  OT General Charges $OT Visit: 1 Visit OT Evaluation $OT Eval Moderate Complexity: 1 Mod    Aarya Quebedeaux L Khalaya Mcgurn 09/14/2022, 2:47 PM

## 2022-09-15 DIAGNOSIS — R7989 Other specified abnormal findings of blood chemistry: Secondary | ICD-10-CM | POA: Diagnosis not present

## 2022-09-15 LAB — COMPREHENSIVE METABOLIC PANEL
ALT: 44 U/L (ref 0–44)
AST: 56 U/L — ABNORMAL HIGH (ref 15–41)
Albumin: 2.8 g/dL — ABNORMAL LOW (ref 3.5–5.0)
Alkaline Phosphatase: 182 U/L — ABNORMAL HIGH (ref 38–126)
Anion gap: 11 (ref 5–15)
BUN: 20 mg/dL (ref 6–20)
CO2: 22 mmol/L (ref 22–32)
Calcium: 9.1 mg/dL (ref 8.9–10.3)
Chloride: 105 mmol/L (ref 98–111)
Creatinine, Ser: 1 mg/dL (ref 0.44–1.00)
GFR, Estimated: >60 mL/min (ref >60)
Glucose, Bld: 107 mg/dL — ABNORMAL HIGH (ref 70–99)
Potassium: 4.4 mmol/L (ref 3.5–5.1)
Sodium: 138 mmol/L (ref 135–145)
Total Bilirubin: 0.5 mg/dL (ref 0.3–1.2)
Total Protein: 6 g/dL — ABNORMAL LOW (ref 6.5–8.1)

## 2022-09-15 LAB — CBC
HCT: 33.5 % — ABNORMAL LOW (ref 36.0–46.0)
Hemoglobin: 11.2 g/dL — ABNORMAL LOW (ref 12.0–15.0)
MCH: 34.8 pg — ABNORMAL HIGH (ref 26.0–34.0)
MCHC: 33.4 g/dL (ref 30.0–36.0)
MCV: 104 fL — ABNORMAL HIGH (ref 80.0–100.0)
Platelets: 258 10*3/uL (ref 150–400)
RBC: 3.22 MIL/uL — ABNORMAL LOW (ref 3.87–5.11)
RDW: 13.2 % (ref 11.5–15.5)
WBC: 6.5 10*3/uL (ref 4.0–10.5)
nRBC: 0 % (ref 0.0–0.2)

## 2022-09-15 MED ORDER — ENSURE ENLIVE PO LIQD
237.0000 mL | Freq: Two times a day (BID) | ORAL | Status: DC
  Administered 2022-09-15 – 2022-09-19 (×7): 237 mL via ORAL

## 2022-09-15 MED ORDER — OXYCODONE HCL 5 MG PO TABS
5.0000 mg | ORAL_TABLET | Freq: Four times a day (QID) | ORAL | Status: DC | PRN
  Administered 2022-09-16 – 2022-09-19 (×9): 5 mg via ORAL
  Filled 2022-09-15 (×9): qty 1

## 2022-09-15 MED ORDER — LISINOPRIL 20 MG PO TABS
20.0000 mg | ORAL_TABLET | Freq: Every day | ORAL | Status: DC
  Administered 2022-09-15 – 2022-09-19 (×5): 20 mg via ORAL
  Filled 2022-09-15 (×5): qty 1

## 2022-09-15 NOTE — Progress Notes (Signed)
Progress Note   Patient: [name] UKG:[id number] DOB: [birth year] DOA: 09/12/2022     2 DOS: the patient was seen and examined on 09/15/2022   Brief hospital course: [name] is a 52 y.o. female with a history of heroin use on chronic buprenorphine, hypertension, alcohol use and mild cognitive impairment. Patient presented secondary to nausea/vomiting in addition to chest pain. Chest pain workup started showed troponin peak of 70 before downtrending, EKG showed normal sinus rhythm with no acute ischemic changes, and TTE showed preserved EF with no wall motion abnormalities.  Hospital course complicated by ongoing lethargy in the setting of reported nausea, vomiting and paresthesia concerning for acute metabolic encephalopathy.  Patient placed on CIWA protocol earlier in admission, now on Ativan spaced out due to somnolence.  And undergoing current workup for metabolic derangements including B1, B12, ammonia, TSH. B1 pending, others are unremarkable.  Of note patient is on buprenorphine for chronic substance use disorder and patient has diagnosis of mild cognitive impairment on neuropsychological evaluation examination as outpatient on 12/01/18/2023.  She reports she underwent a stress test last Wednesday at [facility] and since then she has been having ongoing nausea and vomiting after receiving some intake of medication prior to getting on the treadmill.   Assessment and Plan: Acute metabolic encephalopathy Remains intermittently lethargic in conversation with subjective and objective upper extremity weakness and reported paresthesias.  Concerning for likely metabolic derangement given no acute focal neurologic deficits and  when re-oriented patient follows commands appropriately.  CT head unremarkable, B12 within normal limits.  Unable to cooperate patient's baseline mental status given no family, does have history of mild cognitive impairment per chart review, infectious etiology  thought less likely given afebrile with no white count however patient does spike fever low threshold to obtain LP to rule out any infectious etiology for cognitive symptoms - Follow-up vitamin B1  --low threshold for LP if febrile  --Space out frequency of ativan for CIWA protocol given ongoing lethargy, though more awake this AM  Ongoing nausea vomiting without abdominal pain, currently stable Could be related to potential opiate withdrawal, also consider related to other medicationdenies any abdominal pain so doubt any pancreatitis (lipase within normal limits), less likelyGI pathology, ACS ruled out, also differential includes potential vitamin deficiency or metabolic derangement (UDS negative), neurologically globally intact with no deficits, concern for possible cellulitis of index finger but no signs of systemic involvement with no feer Per admission patient reports not able to fill new prescription, PDMP reviewed shows valid prescription for buprenorphine from 07/1822 - Continue 4 mg Subutex daily --TSH wnl  AKI, likely prerenal in the setting of ongoing nausea, vomiting; resolved now Patient creatinine 1.7-1, on admission creatinine of 1.22, improved with time and IV fluids - Continue to encourage oral hydration and closely monitor BMP - Not currently needing IV fluids - Holding home ACE  History of alcohol dependence Patient reports last drink more than a week ago in the setting of recurrent nausea vomiting No obvious signs of alcohol withdrawal. Alcohol level less than 10 on admission - Monitor on CIWA - Decreased Ativan frequency from q1 hours to q6 hrs PRN given ongoing lethargy, closely monitoring continue to resume for withdrawal symptoms though less likely given outside the window for withdrawal   Elevated troponin I level, resolved, non-ACS - Troponin trend negative, TTE with no acute abnormalities - Started on aspirin during initial workup -patient remains chest  pain-free, no indication currently for further workup  History  of heroin use and chronic pain syndrome Stable  - Continue Subutex  Benign essential hypertension Patient on ACE/HCTZ at home, held for AKI will resume Lisinopril today as resolved. -Due to her acute kidney injury, will hold this medication.  Cellulitis of right index finger Redness at DIP of right index with blanching erythema, no appreciable tenderness but limited due to fluctuating lethargy. - Started on Keflex on admission for presumed cellulitis  Hypokalemia secondary to ongoing nausea and vomiting, improved - Holding home HCTZ Status post IV potassium repletion, current potassium 5 - Repeat BMP in a.m.  Subjective: Sitting up in bed this AM, says "I've been better" but is alert and oriented, no longer tearful. She is glad to be able to eat and about to order breakfast.  Physical Exam: Vitals:   09/14/22 1839 09/14/22 2059 09/14/22 2304 09/15/22 0608  BP: (!) 167/103 (!) 140/99 (!) 141/95 (!) 149/91  Pulse:  (!) 105 (!) 103 79  Resp:  20  16  Temp:  97.9 F (36.6 C)  97.7 F (36.5 C)  TempSrc:  Oral  Oral  SpO2:  100%  100%  Weight:    57.5 kg  Height:      Sitting up in bed, NAD, staying awake, pleasant and conversant Regular rate and rhythm, no peripheral edema Normal respiratory effort, no rales, normal breath sounds Abdomen soft, nondistended, nontender, normal bowel sounds 4-5 grip strength in upper extremity strength, normal strength in bilateral lower extremities, no appreciable asterixis Alert to self, place, time and year  Data Reviewed:  There are no new results to review at this time.  Family Communication: none  Disposition: Status is: Inpatient Remains inpatient appropriate because: Ongoing workup of acute metabolic encephalopathy, close monitoring of electrolyte derangements in the setting of nausea, vomiting, close monitoring of any infectious etiology.  Planned Discharge Destination:  Anticipate SNF per PT recommendation.    DVT prophylaxis-heparin subcu   Author: Kanin Lia Marry Guan, MD 09/15/2022 7:39 AM  For on call review [email or web address].

## 2022-09-15 NOTE — TOC Initial Note (Signed)
Transition of Care [facility]) - Initial/Assessment Note    Patient Details  Name: [name] MRN: [id number] Date of Birth: 1971/04/12  Transition of Care [facility]) CM/SW Contact:    Illene Regulus, LCSW Phone Number: 09/15/2022, 2:33 PM  Clinical Narrative:                 CSW received a consult to speak with pt about substance resources, food insecurity, housing, and transportation. Pt was tearful throughout the conversation. Pt reported she lives with her boyfriend and has a son who lives in [place]. Pt reported she needs more help in the home.  Pt reported  she is behind on her rent and is afraid she will be evicted. CSW informed pt about [employer], and how they may be able to provide rental assistance. Pt reported she has been working to get disability and was just recently denied. She reported she has a Chief Executive Officer that is working with her on reapplying. Pt requested the hospital to fill out disability paperwork. CSW informed pt she would have to get her PCP to fill out paperwork. Pt reported she receives PCP services through [facility].  Pt reports she receives suboxone through [employer] informed pt she does have Medicaid where she can apply for CAPP aide services. CSW also informed pt she can receive transportation assistance as well as food assistance through social services. Pt reported not having issues with getting food, just with keeping food down. Pt declined substance resources. Pt were more focused on getting disability paperwork filled out. Pt requested CSW to make a follow-up appointment with her [facility] PCP.   CSW spoke with pt about the recommendation for SNF placement. Pt was hesitant to agree but they changed her mind. CSW explained the process and the barriers to placement with her insurance. Pt reported she wanted to stay local. CSW will work pt up for SNF placement. TOC to follow   Expected Discharge Plan:   (TBD) Barriers to Discharge: Continued Medical Work up, Inadequate or no insurance   Patient Goals and CMS Choice Patient states their goals for this hospitalization and ongoing recovery are:: get stronger CMS Medicare.gov Compare Post Acute Care list provided to:: Patient        Expected Discharge Plan and Services       Living arrangements for the past 2 months: Single Family Home                                      Prior Living Arrangements/Services Living arrangements for the past 2 months: Single Family Home Lives with:: Self, Domestic Partner Patient language and need for interpreter reviewed:: Yes Do you feel safe going back to the place where you live?: Yes      Need for Family Participation in Patient Care: Yes (Comment) Care giver support system in place?: Yes (comment)   Criminal Activity/Legal Involvement Pertinent to Current Situation/Hospitalization: No - Comment as needed  Activities of Daily Living Home Assistive Devices/Equipment: Dentures (specify type), Walker (specify type), Cane (specify quad or straight), Brace (specify type) (upper partial; ankle braces) ADL Screening (condition at time of admission) Patient's cognitive ability adequate to safely complete daily activities?: Yes Is the patient deaf or have difficulty hearing?: Yes Does the patient have difficulty seeing, even when wearing glasses/contacts?: Yes Does the patient have difficulty concentrating, remembering, or making decisions?: Yes Patient  able to express need for assistance with ADLs?: Yes Does the patient have difficulty dressing or bathing?: Yes Independently performs ADLs?: No Communication: Independent Dressing (OT): Needs assistance Is this a change from baseline?: Pre-admission baseline Grooming: Independent Feeding: Independent Bathing: Needs assistance Is this a change from baseline?: Pre-admission baseline Toileting: Needs assistance Is this a change from  baseline?: Change from baseline, expected to last >3days In/Out Bed: Needs assistance Is this a change from baseline?: Change from baseline, expected to last >3 days Walks in Home: Needs assistance Is this a change from baseline?: Change from baseline, expected to last >3 days Does the patient have difficulty walking or climbing stairs?: Yes Weakness of Legs: Both Weakness of Arms/Hands: Both  Permission Sought/Granted                  Emotional Assessment Appearance:: Appears stated age Attitude/Demeanor/Rapport: Gracious, Crying Affect (typically observed): Accepting, Anxious, Overwhelmed, Sad Orientation: : Oriented to Self, Oriented to Place, Oriented to  Time, Oriented to Situation   Psych Involvement: No (comment)  Admission diagnosis:  Hypokalemia [E87.6] Elevated troponin [R79.89] Elevated troponin I level [R79.89] Nausea and vomiting, unspecified vomiting type [R11.2] Patient Active Problem List   Diagnosis Date Noted   Acute metabolic encephalopathy 37/90/2409   Nausea and vomiting 09/14/2022   Elevated troponin 09/13/2022   Elevated troponin I level 09/12/2022   Alcohol use 09/12/2022   AKI (acute kidney injury) ([place]) 09/12/2022   Hypokalemia 09/12/2022   Opiate withdrawal ([place]) - possible 09/12/2022   Cellulitis of right index finger 09/12/2022   Major depressive disorder    Generalized anxiety disorder    History of heroin use    Crohn's disease    Obstructive sleep apnea    Gout 04/09/2021   Dilated cbd, acquired 04/09/2021   Chronic pain syndrome 04/09/2021   Benign essential hypertension 04/09/2021   Alcohol dependence in sustained full remission    PCP:  [facility]:   [address] [employer], [place] [address] Phone: [phone] Fax: [phone]  [id number] [place], [address] [place] [address] Phone:  [phone] Fax: [phone]     Social Determinants of Health (SDOH) Social History: SDOH Screenings   Food Insecurity: Food Insecurity Present (09/13/2022)  Housing: High Risk (09/13/2022)  Transportation Needs: Unmet Transportation Needs (09/13/2022)  Utilities: At Risk (09/13/2022)  Alcohol Screen: Low Risk  (04/22/2021)  Depression (PHQ2-9): Low Risk  (01/04/2022)  Tobacco Use: High Risk (09/12/2022)   SDOH Interventions:     Readmission Risk Interventions     No data to display

## 2022-09-15 NOTE — Progress Notes (Signed)
..  30 Day Pass[place] Note  RE: [name]  Date of Birth: 1971/04/19 Date: 09/15/2022  To Whom It May Concern:  Please be advised that the above-named patient will require a short-term nursing home stay - anticipated 30 days or less for rehabilitation and strengthening.  The plan is for return home.

## 2022-09-15 NOTE — NC FL2 (Signed)
[place] LEVEL OF CARE FORM     IDENTIFICATION  Patient Name: [name] Birthdate: 12-11-1970 Sex: female Admission Date (Current Location): 09/12/2022  [place] and [place] Number:  Kathleen Argue [id number] [place] and Address:  [facility],  [address] [place], [place]      Provider Number: [id number]  Attending Physician Name and Address:  Hollice Gong, Mir Mohammed*  Relative Name and Phone Number:       Current Level of Care: Hospital Recommended Level of Care: [place] Prior Approval Number:    Date Approved/Denied:   PASRR Number: Pending  Discharge Plan: SNF    Current Diagnoses: Patient Active Problem List   Diagnosis Date Noted   Acute metabolic encephalopathy 67/34/1937   Nausea and vomiting 09/14/2022   Elevated troponin 09/13/2022   Elevated troponin I level 09/12/2022   Alcohol use 09/12/2022   AKI (acute kidney injury) ([place]) 09/12/2022   Hypokalemia 09/12/2022   Opiate withdrawal ([place]) - possible 09/12/2022   Cellulitis of right index finger 09/12/2022   Major depressive disorder    Generalized anxiety disorder    History of heroin use    Crohn's disease    Obstructive sleep apnea    Gout 04/09/2021   Dilated cbd, acquired 04/09/2021   Chronic pain syndrome 04/09/2021   Benign essential hypertension 04/09/2021   Alcohol dependence in sustained full remission     Orientation RESPIRATION BLADDER Height & Weight     Self, Time, Situation, Place  Normal Continent Weight: 126 lb 12.2 oz (57.5 kg) Height:  5\' 6"  (167.6 cm)  BEHAVIORAL SYMPTOMS/MOOD NEUROLOGICAL BOWEL NUTRITION STATUS      Continent Diet (Heart)  AMBULATORY STATUS COMMUNICATION OF NEEDS Skin   Limited Assist Verbally Normal                       Personal Care Assistance Level of Assistance  Bathing, Feeding, Dressing Bathing Assistance: Limited assistance Feeding assistance: Independent Dressing Assistance: Limited  assistance     Functional Limitations Info  Sight, Hearing, Speech Sight Info: Adequate Hearing Info: Adequate Speech Info: Adequate    SPECIAL CARE FACTORS FREQUENCY  PT (By licensed PT), OT (By licensed OT)     PT Frequency: 5 x a week OT Frequency: 5 x a week            Contractures Contractures Info: Not present    Additional Factors Info  Code Status, Allergies Code Status Info: full Allergies Info: Penicillins           Current Medications (09/15/2022):  This is the current hospital active medication list Current Facility-Administered Medications  Medication Dose Route Frequency Provider Last Rate Last Admin   acetaminophen (TYLENOL) tablet 650 mg  650 mg Oral Q6H PRN Kristopher Oppenheim, DO   650 mg at 09/15/22 1152   Or   acetaminophen (TYLENOL) suppository 650 mg  650 mg Rectal Q6H PRN Kristopher Oppenheim, DO       aspirin EC tablet 81 mg  81 mg Oral Daily Kristopher Oppenheim, DO   81 mg at 09/15/22 1127   buprenorphine (SUBUTEX) SL tablet 4 mg  4 mg Sublingual Daily Mariel Aloe, MD   4 mg at 09/15/22 1145   cephALEXin (KEFLEX) capsule 500 mg  500 mg Oral Q12H Kristopher Oppenheim, DO   500 mg at 09/15/22 1127   feeding supplement (ENSURE ENLIVE / ENSURE PLUS) liquid 237 mL  237 mL Oral BID BM Hollice Gong, Mir  Mohammed, MD       folic acid (FOLVITE) tablet 1 mg  1 mg Oral Daily Kristopher Oppenheim, DO   1 mg at 09/15/22 1126   heparin injection 5,000 Units  5,000 Units Subcutaneous Q8H Mariel Aloe, MD   5,000 Units at 09/15/22 1409   hydrALAZINE (APRESOLINE) injection 5 mg  5 mg Intravenous Q6H PRN Oretha Milch D, MD   5 mg at 09/15/22 1409   lisinopril (ZESTRIL) tablet 20 mg  20 mg Oral Daily Hollice Gong, Mir Mohammed, MD   20 mg at 09/15/22 1126   LORazepam (ATIVAN) tablet 1-4 mg  1-4 mg Oral Q4H PRN Oretha Milch D, MD   2 mg at 09/14/22 2314   multivitamin with minerals tablet 1 tablet  1 tablet Oral Daily Kristopher Oppenheim, DO   1 tablet at 09/15/22 1126   nicotine (NICODERM CQ - dosed in mg/24  hours) patch 14 mg  14 mg Transdermal Daily Kristopher Oppenheim, DO   14 mg at 09/15/22 1128   ondansetron (ZOFRAN) tablet 4 mg  4 mg Oral Q6H PRN Kristopher Oppenheim, DO       Or   ondansetron [facility]) injection 4 mg  4 mg Intravenous Q6H PRN Kristopher Oppenheim, DO       oxyCODONE (Oxy IR/ROXICODONE) immediate release tablet 5 mg  5 mg Oral Q6H PRN Hollice Gong, Mir Earlie Server, MD       pantoprazole (PROTONIX) EC tablet 40 mg  40 mg Oral Daily Kristopher Oppenheim, DO   40 mg at 09/15/22 1127   thiamine (VITAMIN B1) tablet 100 mg  100 mg Oral Daily Kristopher Oppenheim, DO   100 mg at 09/15/22 1126   Or   thiamine (VITAMIN B1) injection 100 mg  100 mg Intravenous Daily Kristopher Oppenheim, DO   100 mg at 09/12/22 1724     Discharge Medications: Please see discharge summary for a list of discharge medications.  Relevant Imaging Results:  Relevant Lab Results:   Additional Information [phone]  [place], LCSW

## 2022-09-15 NOTE — Progress Notes (Signed)
Initial Nutrition Assessment  DOCUMENTATION CODES:   Non-severe (moderate) malnutrition in context of acute illness/injury  INTERVENTION:  - Heart healthy diet.  - Ensure Plus High Protein po BID, each supplement provides 350 kcal and 20 grams of protein. - Daily multivitamin, folic acid, and thiamine for history of alcohol use.  - Monitor weight trends.    NUTRITION DIAGNOSIS:   Moderate Malnutrition related to acute illness as evidenced by mild fat depletion, mild muscle depletion, energy intake < 75% for > 7 days.  GOAL:   Patient will meet greater than or equal to 90% of their needs  MONITOR:   PO intake, Supplement acceptance, Weight trends  REASON FOR ASSESSMENT:   Malnutrition Screening Tool    ASSESSMENT:   52 year old female history of prior IV heroin use, now on chronic buprenorphine via pain clinic, HTN, history of hypokalemia, chronic alcohol use who presented with nausea and vomiting and acute encephalopathy.  Patient alert and oriented at time of visit this afternoon. She reports UBW of 130-140# and weight loss to around 120# over the past ~1 month due to nausea and vomiting and inability to eat food down.  Per EMR, last weight prior to this admission was back in May 2023 when patient was weighed at 136#. She is now weighed at 126#. She notes her weight tends to fluctuate up and down because she has Crohn's disease and will sometimes have diarrhea or constipation that impact intake. Of note, there is no history of Crohn's disease documented in patient's chart.  Typically has 2 meals a day. Over the past 3-4 weeks she reports she has been vomiting all food, drinks, and medications she has been taking. Appetite has also been poor. She notes her current appetite remains decreased but she has finally been able to keep some food down. She is documented to have had 100% of dinner last night.  She is agreeable and would like to receive Ensure during admission. She notes  they can be expensive or she  would drink them at home. Can provide coupons.    Medications reviewed and include: Folic acid, MVI, thiamine   Labs reviewed:  -   NUTRITION - FOCUSED PHYSICAL EXAM:  Flowsheet Row Most Recent Value  Orbital Region Mild depletion  Upper Arm Region No depletion  Thoracic and Lumbar Region No depletion  Buccal Region Mild depletion  Temple Region Mild depletion  Clavicle Bone Region Mild depletion  Clavicle and Acromion Bone Region Mild depletion  Scapular Bone Region Unable to assess  Dorsal Hand No depletion  Patellar Region Mild depletion  Anterior Thigh Region Mild depletion  Posterior Calf Region Mild depletion  Edema (RD Assessment) None  Hair Reviewed  Eyes Reviewed  Mouth Reviewed  Skin Reviewed  Nails Reviewed       Diet Order:   Diet Order             Diet Heart Room service appropriate? Yes; Fluid consistency: Thin  Diet effective now                   EDUCATION NEEDS:  Education needs have been addressed  Skin:  Skin Assessment: Reviewed RN Assessment  Last BM:  PTA  Height:  Ht Readings from Last 1 Encounters:  09/13/22 5\' 6"  (1.676 m)   Weight:  Wt Readings from Last 1 Encounters:  09/15/22 57.5 kg    BMI:  Body mass index is 20.46 kg/m.  Estimated Nutritional Needs:  Kcal:  1600-1850 kcals Protein:  70-80 grams Fluid:  >/= 1.6L    Samson Frederic RD, LDN For contact information, refer to [facility].

## 2022-09-16 DIAGNOSIS — E440 Moderate protein-calorie malnutrition: Secondary | ICD-10-CM | POA: Insufficient documentation

## 2022-09-16 DIAGNOSIS — R7989 Other specified abnormal findings of blood chemistry: Secondary | ICD-10-CM | POA: Diagnosis not present

## 2022-09-16 NOTE — Progress Notes (Signed)
Occupational Therapy Treatment Patient Details Name: [name] MRN: [id number] DOB: 1971-03-13 Today's Date: 09/16/2022   History of present illness Ms. Fetherolf is a 52 yr old female admitted to the hospital with intractable nausea & vomiting for ~1 week. She was found to have elevated troponin, AKI, hypokalemia, possible ETOH withdrawal, and cellulitis of the R index finger. PMH: HTN, substance abuse, ETOH abuse, chronic pain, Crohn's disease, gout   OT comments  Patient presented with much improved alertness, command follow, and overall cognition today. She progressed to performing sit to stand, ambulating to & from the bathroom in her room using a RW, performing grooming in standing at the sink, and performing upper and lower body dressing tasks. She required no more than min guard assist for dressing tasks and grooming. Given her noted physical and cognitive improvements, her discharge recommendation has been updated to home therapy, which she prefers and is in agreement with, as she stated her significant other would be able to assist her if needed upon her return home. Continue OT plan of care.    Recommendations for follow up therapy are one component of a multi-disciplinary discharge planning process, led by the attending physician.  Recommendations may be updated based on patient status, additional functional criteria and insurance authorization.    Follow Up Recommendations  Home health OT     Assistance Recommended at Discharge Intermittent Supervision/Assistance  Patient can return home with the following  A little help with walking and/or transfers;A little help with bathing/dressing/bathroom;Help with stairs or ramp for entrance;Assistance with cooking/housework;Assist for transportation;Direct supervision/assist for medications management   Equipment Recommendations  None recommended by OT       Precautions / Restrictions Precautions Precautions:  Fall Restrictions Weight Bearing Restrictions: No       Mobility Bed Mobility Overal bed mobility: Needs Assistance Bed Mobility: Supine to Sit     Supine to sit: Supervision Sit to supine: Supervision        Transfers Overall transfer level: Needs assistance Equipment used: Rolling walker (2 wheels) Transfers: Sit to/from Stand Sit to Stand: Min guard           General transfer comment: Pt further ambulated to and from the bathroom in her room using a RW, requiring min intermittent verbal cues for walker placement.         ADL either performed or assessed with clinical judgement   ADL Overall ADL's : Needs assistance/impaired Eating/Feeding: Independent;Bed level Eating/Feeding Details (indicate cue type and reason): pt observed feeding independently in bed at the start of the session Grooming: Min guard;Standing Grooming Details (indicate cue type and reason): Pt performed face washing in standing at sink level. Upper Body Bathing: Min guard Upper Body Bathing Details (indicate cue type and reason): She performed partial upper body bathing standing at the sink.     Upper Body Dressing : Set up;Sitting Upper Body Dressing Details (indicate cue type and reason): She donned a hospital gown seated EOB. Lower Body Dressing: Min guard;Sit to/from stand Lower Body Dressing Details (indicate cue type and reason): She doffed her underwear in standing with min guard assist, then donned socks seated EOB with set-up assist.                      Cognition Arousal/Alertness: Awake/alert Behavior During Therapy: WFL for tasks assessed/performed Overall Cognitive Status: Within Functional Limits for tasks assessed      General Comments: much improved alertness and command follow  General Comments      Pertinent Vitals/ Pain       Pain Assessment Pain Assessment: No/denies pain         Frequency  Min 2X/week        Progress Toward  Goals  OT Goals(current goals can now be found in the care plan section)  Progress towards OT goals: Progressing toward goals  Acute Rehab OT Goals OT Goal Formulation: With patient Time For Goal Achievement: 09/28/22 Potential to Achieve Goals: Good  Plan Discharge plan needs to be updated       AM-PAC OT "6 Clicks" Daily Activity     Outcome Measure   Help from another person eating meals?: None Help from another person taking care of personal grooming?: A Little Help from another person toileting, which includes using toliet, bedpan, or urinal?: A Little Help from another person bathing (including washing, rinsing, drying)?: A Little Help from another person to put on and taking off regular upper body clothing?: None Help from another person to put on and taking off regular lower body clothing?: A Little 6 Click Score: 20    End of Session Equipment Utilized During Treatment: Rolling walker (2 wheels)  OT Visit Diagnosis: Unsteadiness on feet (R26.81);Muscle weakness (generalized) (M62.81)   Activity Tolerance Patient tolerated treatment well   Patient Left in bed;with call bell/phone within reach;with bed alarm set   Nurse Communication Mobility status        Time: 1700-1725 OT Time Calculation (min): 25 min  Charges: OT General Charges $OT Visit: 1 Visit OT Treatments $Self Care/Home Management : 8-22 mins $Therapeutic Activity: 8-22 mins    Leota Sauers, OTR/L 09/16/2022, 5:33 PM

## 2022-09-16 NOTE — TOC Progression Note (Signed)
Transition of Care [facility]) - Progression Note    Patient Details  Name: BRANDEY VANDALEN MRN: [id number] Date of Birth: Feb 08, 1971  Transition of [facility]) CM/SW [place], LCSW Phone Number: 09/16/2022, 10:35 AM  Clinical Narrative:    Pt was declined by all facility in [place] and two facility in [place]. CSW informed pt and inquired about sending outside of county pt declined.MD made aware. CSW will attempt to arrange [facility] services. CSW informed it may be difficult with arranging [place] services due to her insurance. Pt did not have a preference in agency, CSW will work on arranging [facility] services. TOC to follow.   Expected Discharge Plan:  (TBD) Barriers to Discharge: Continued Medical Work up, Inadequate or no insurance  Expected Discharge Plan and Services       Living arrangements for the past 2 months: Single Family Home                                       Social Determinants of Health (SDOH) Interventions SDOH Screenings   Food Insecurity: Food Insecurity Present (09/13/2022)  Housing: High Risk (09/13/2022)  Transportation Needs: Unmet Transportation Needs (09/13/2022)  Utilities: At Risk (09/13/2022)  Alcohol Screen: Low Risk  (04/22/2021)  Depression (PHQ2-9): Low Risk  (01/04/2022)  Tobacco Use: High Risk (09/12/2022)    Readmission Risk Interventions     No data to display

## 2022-09-16 NOTE — Progress Notes (Signed)
Progress Note   Patient: [name] EHU:[id number] DOB: November 29, 1970 DOA: 09/12/2022     3 DOS: the patient was seen and examined on 09/16/2022   Brief hospital course: [name] is a 52 y.o. female with a history of heroin use on chronic buprenorphine, hypertension, alcohol use and mild cognitive impairment. Patient presented secondary to nausea/vomiting in addition to chest pain. Chest pain workup started showed troponin peak of 70 before downtrending, EKG showed normal sinus rhythm with no acute ischemic changes, and TTE showed preserved EF with no wall motion abnormalities.  Hospital course complicated by ongoing lethargy in the setting of reported nausea, vomiting and paresthesia concerning for acute metabolic encephalopathy.  Patient placed on CIWA protocol earlier in admission, now on Ativan spaced out due to somnolence.  And undergoing current workup for metabolic derangements including B1, B12, ammonia, TSH. B1 pending, others are unremarkable.  Of note patient is on buprenorphine for chronic substance use disorder and patient has diagnosis of mild cognitive impairment on neuropsychological evaluation examination as outpatient on 12/01/18/2023.  She reports she underwent a stress test last Wednesday at [facility] and since then she has been having ongoing nausea and vomiting after receiving some intake of medication prior to getting on the treadmill.  Assessment and Plan: Acute metabolic encephalopathy Remains intermittently lethargic in conversation with subjective and objective upper extremity weakness and reported paresthesias.  Concerning for likely metabolic derangement given no acute focal neurologic deficits and  when re-oriented patient follows commands appropriately.  CT head unremarkable, B12 within normal limits.  Unable to cooperate patient's baseline mental status given no family, does have history of mild cognitive impairment per chart review, infectious etiology  thought less likely given afebrile with no white count however patient does spike fever low threshold to obtain LP to rule out any infectious etiology for cognitive symptoms - Follow-up vitamin B1  --low threshold for LP if febrile  --Space out frequency of ativan for CIWA protocol given ongoing lethargy, though more awake this AM  Ongoing nausea vomiting without abdominal pain, currently stable Could be related to potential opiate withdrawal, also consider related to other medicationdenies any abdominal pain so doubt any pancreatitis (lipase within normal limits), less likelyGI pathology, ACS ruled out, also differential includes potential vitamin deficiency or metabolic derangement (UDS negative), neurologically globally intact with no deficits, concern for possible cellulitis of index finger but no signs of systemic involvement with no feer Per admission patient reports not able to fill new prescription, PDMP reviewed shows valid prescription for buprenorphine from 07/1822 - Continue 4 mg Subutex daily --TSH wnl  AKI, likely prerenal in the setting of ongoing nausea, vomiting; resolved now Patient creatinine 1.7-1, on admission creatinine of 1.22, improved with time and IV fluids - Continue to encourage oral hydration and closely monitor BMP - Not currently needing IV fluids - Holding home ACE  History of alcohol dependence Patient reports last drink more than a week ago in the setting of recurrent nausea vomiting No obvious signs of alcohol withdrawal. Alcohol level less than 10 on admission - Monitor on CIWA - Decreased Ativan frequency from q1 hours to q6 hrs PRN given ongoing lethargy, closely monitoring continue to resume for withdrawal symptoms though less likely given outside the window for withdrawal   Elevated troponin I level, resolved, non-ACS - Troponin trend negative, TTE with no acute abnormalities - Started on aspirin during initial workup - patient remains chest  pain-free, no indication currently for further workup  History  of heroin use and chronic pain syndrome Stable  - Continue Subutex  Benign essential hypertension Patient on ACE/HCTZ at home, held for AKI will resume Lisinopril today as resolved. -Due to her acute kidney injury, will hold this medication.  Cellulitis of right index finger Redness at DIP of right index with blanching erythema, no appreciable tenderness but limited due to fluctuating lethargy. - Started on Keflex on admission for presumed cellulitis  Hypokalemia secondary to ongoing nausea and vomiting, improved - Holding home HCTZ Status post IV potassium repletion, current potassium 5 - Repeat BMP in a.m.  Subjective: Sitting up in bed this AM, having breakfast. She feels well and denies any pain or nausea.  Physical Exam: Vitals:   09/15/22 1247 09/15/22 2036 09/16/22 0500 09/16/22 0539  BP: (!) 154/108 (!) 152/96  (!) 155/111  Pulse: 91 83  74  Resp: 16 17  17   Temp: 97.9 F (36.6 C) 97.9 F (36.6 C)  97.8 F (36.6 C)  TempSrc: Oral Oral  Oral  SpO2: 99% 100%  98%  Weight:   58.8 kg   Height:      General:  Alert, oriented, calm, in no acute distress, sitting up in bed having breakfast Eyes: EOMI, clear conjuctivae, white sclerea Neck: supple, no masses, trachea mildline  Cardiovascular: RRR, no murmurs or rubs, no peripheral edema  Respiratory: clear to auscultation bilaterally, no wheezes, no crackles  Abdomen: soft, nontender, nondistended, normal bowel tones heard  Skin: dry, no rashes  Musculoskeletal: no joint effusions, normal range of motion  Psychiatric: appropriate affect, normal speech  Neurologic: extraocular muscles intact, clear speech, moving all extremities with intact sensorium  Data Reviewed:  There are no new results to review at this time.  Family Communication: none  Disposition: Status is: Inpatient Remains inpatient appropriate because: Ongoing workup of acute metabolic  encephalopathy, close monitoring of electrolyte derangements in the setting of nausea, vomiting, close monitoring of any infectious etiology.  Planned Discharge Destination: Anticipate SNF per PT recommendation, TOC has initiated bed search.    DVT prophylaxis-heparin subcu   Author: Clairissa Valvano Marry Guan, MD 09/16/2022 8:44 AM  For on call review [email or web address].

## 2022-09-17 DIAGNOSIS — R7989 Other specified abnormal findings of blood chemistry: Secondary | ICD-10-CM | POA: Diagnosis not present

## 2022-09-17 MED ORDER — AMLODIPINE BESYLATE 10 MG PO TABS
5.0000 mg | ORAL_TABLET | Freq: Every day | ORAL | Status: DC
  Administered 2022-09-18 – 2022-09-19 (×2): 5 mg via ORAL
  Filled 2022-09-17 (×3): qty 1

## 2022-09-17 MED ORDER — SENNOSIDES-DOCUSATE SODIUM 8.6-50 MG PO TABS
1.0000 | ORAL_TABLET | Freq: Every day | ORAL | Status: DC
  Administered 2022-09-18 – 2022-09-19 (×2): 1 via ORAL
  Filled 2022-09-17 (×2): qty 1

## 2022-09-17 MED ORDER — POLYETHYLENE GLYCOL 3350 17 G PO PACK
17.0000 g | PACK | Freq: Every day | ORAL | Status: DC
  Administered 2022-09-18 – 2022-09-19 (×2): 17 g via ORAL
  Filled 2022-09-17 (×2): qty 1

## 2022-09-17 NOTE — Progress Notes (Signed)
Progress Note   Patient: [name] GUR:[id number] DOB: [birth year] DOA: 09/12/2022     4 DOS: the patient was seen and examined on 09/17/2022   Brief hospital course: [name] is a 52 y.o. female with a history of heroin use on chronic buprenorphine, hypertension, alcohol use and mild cognitive impairment. Patient presented secondary to nausea/vomiting in addition to chest pain. Chest pain workup started showed troponin peak of 70 before downtrending, EKG showed normal sinus rhythm with no acute ischemic changes, and TTE showed preserved EF with no wall motion abnormalities.  Hospital course complicated by ongoing lethargy in the setting of reported nausea, vomiting and paresthesia concerning for acute metabolic encephalopathy.  Patient placed on CIWA protocol earlier in admission, now on Ativan spaced out due to somnolence.  And undergoing current workup for metabolic derangements including B1, B12, ammonia, TSH. B1 pending, others are unremarkable.  Of note patient is on buprenorphine for chronic substance use disorder and patient has diagnosis of mild cognitive impairment on neuropsychological evaluation examination as outpatient on 12/01/18/2023.  She reports she underwent a stress test last Wednesday at [facility] and since then she has been having ongoing nausea and vomiting after receiving some intake of medication prior to getting on the treadmill.  09/17/2022: Patient seen.  Discussed with the patient's nurse.  Encephalopathy said to be improving.  Patient is awaiting skilled nursing facility placement.  Will consult PT OT.  Otherwise, no new complaints.  Assessment and Plan: Acute metabolic encephalopathy Remains intermittently lethargic in conversation with subjective and objective upper extremity weakness and reported paresthesias.  Concerning for likely metabolic derangement given no acute focal neurologic deficits and  when re-oriented patient follows commands  appropriately.  CT head unremarkable, B12 within normal limits.  Unable to cooperate patient's baseline mental status given no family, does have history of mild cognitive impairment per chart review, infectious etiology thought less likely given afebrile with no white count however patient does spike fever low threshold to obtain LP to rule out any infectious etiology for cognitive symptoms - Follow-up vitamin B1  --low threshold for LP if febrile  --Space out frequency of ativan for CIWA protocol given ongoing lethargy, though more awake this AM   Ongoing nausea vomiting without abdominal pain, currently stable Could be related to potential opiate withdrawal, also consider related to other medicationdenies any abdominal pain so doubt any pancreatitis (lipase within normal limits), less likelyGI pathology, ACS ruled out, also differential includes potential vitamin deficiency or metabolic derangement (UDS negative), neurologically globally intact with no deficits, concern for possible cellulitis of index finger but no signs of systemic involvement with no feer Per admission patient reports not able to fill new prescription, PDMP reviewed shows valid prescription for buprenorphine from 07/1822 - Continue 4 mg Subutex daily --TSH wnl  AKI: -Likely prerenal in the setting of ongoing nausea, vomiting; resolved now Patient creatinine 1.7-1, on admission creatinine of 1.22, improved with time and IV fluids - Continue to encourage oral hydration and closely monitor BMP - Not currently needing IV fluids - Holding home ACE 09/17/2022: AKI has resolved.  History of alcohol dependence Patient reports last drink more than a week ago in the setting of recurrent nausea vomiting No obvious signs of alcohol withdrawal. Alcohol level less than 10 on admission - Monitor on CIWA     Elevated troponin I level, resolved, non-ACS - Troponin trend negative, TTE with no acute abnormalities - Started on aspirin  during initial workup - patient remains  chest pain-free, no indication currently for further workup  History of heroin use and chronic pain syndrome Stable  - Continue Subutex  Benign essential hypertension Patient on ACE/HCTZ at home, held for AKI will resume Lisinopril today as resolved. -Due to her acute kidney injury, will hold this medication. 09/17/2022: Blood pressure remains uncontrolled.  Will add amlodipine 5 Mg p.o. once daily.  Cellulitis of right index finger Redness at DIP of right index with blanching erythema, no appreciable tenderness but limited due to fluctuating lethargy. - Started on Keflex on admission for presumed cellulitis  Hypokalemia secondary to ongoing nausea and vomiting, improved - Holding home HCTZ Status post IV potassium repletion, current potassium 5 - Repeat BMP in a.m. 09/17/2022: Hypokalemia has resolved.  Potassium is 4.4 today.  Continue to monitor renal function and electrolytes.  HCTZ is on hold.  Subjective: -No new complaints. -Remains mildly lethargic.  Physical Exam: Vitals:   09/16/22 0539 09/16/22 1356 09/16/22 1900 09/17/22 0505  BP: (!) 155/111 (!) 149/100 (!) 137/97 (!) 137/98  Pulse: 74 82 80 77  Resp: 17 16  20   Temp: 97.8 F (36.6 C) 97.6 F (36.4 C) 99.1 F (37.3 C) 98 F (36.7 C)  TempSrc: Oral Oral Oral Oral  SpO2: 98% 99% 98% 99%  Weight:    58.9 kg  Height:      General: Mildly lethargic.  Not in any distress. HEENT: Patient is pale. Neck: Supple. Lungs: Clear to auscultation. CVs: S1-S2. Abdomen: Soft and nontender. Neuro: Mildly lethargic.  Moves all EXTR. Extremities: No leg edema.  Data Reviewed:  There are no new results to review at this time.  Family Communication: none  Disposition: Status is: Inpatient Remains inpatient appropriate because: Ongoing workup of acute metabolic encephalopathy, close monitoring of electrolyte derangements in the setting of nausea, vomiting, close monitoring of any  infectious etiology.  Planned Discharge Destination: Anticipate SNF per PT recommendation, TOC has initiated bed search.    DVT prophylaxis-heparin subcu   Time spent: 35 minutes.  Author: Bonnell Public, MD 09/17/2022 11:11 AM  For on call review [email or web address].

## 2022-09-17 NOTE — TOC Progression Note (Signed)
Transition of Care [facility]) - Progression Note    Patient Details  Name: [name] MRN: [id number] Date of Birth: 1971/05/18  Transition of Care [facility]) CM/SW [place], LCSW Phone Number: 09/17/2022, 3:57 PM  Clinical Narrative:    CSW has reached out to Home Health agencies: [facility]-declined [facility]- [facility]declined [facility]declined [facility]- declined [facility]-declined [facility]- pending [facility]- pending  CSW has reached out to 6 different [place] , and is still unable to arrange [facility] services for pt. Pt currently have two agencies pending. Pt may have to follow up with an OP provider. TOC to follow. Expected Discharge Plan:  (TBD) Barriers to Discharge: Continued Medical Work up, Inadequate or no insurance  Expected Discharge Plan and Services       Living arrangements for the past 2 months: Single Family Home                                       Social Determinants of Health (SDOH) Interventions SDOH Screenings   Food Insecurity: Food Insecurity Present (09/13/2022)  Housing: High Risk (09/13/2022)  Transportation Needs: Unmet Transportation Needs (09/13/2022)  Utilities: At Risk (09/13/2022)  Alcohol Screen: Low Risk  (04/22/2021)  Depression (PHQ2-9): Low Risk  (01/04/2022)  Tobacco Use: High Risk (09/12/2022)    Readmission Risk Interventions     No data to display

## 2022-09-18 DIAGNOSIS — R7989 Other specified abnormal findings of blood chemistry: Secondary | ICD-10-CM | POA: Diagnosis not present

## 2022-09-18 NOTE — Progress Notes (Signed)
PROGRESS NOTE    [name]  PYK:[id number] DOB: 03/31/1971 DOA: 09/12/2022 PCP: [facility]   Brief Narrative:52 y.o. female with a history of heroin use on chronic buprenorphine, hypertension, alcohol use and mild cognitive impairment. Patient presented secondary to nausea/vomiting in addition to chest pain. Chest pain workup started showed troponin peak of 70 before downtrending, EKG showed normal sinus rhythm with no acute ischemic changes, and TTE showed preserved EF with no wall motion abnormalities.  Hospital course complicated by ongoing lethargy in the setting of reported nausea, vomiting and paresthesia concerning for acute metabolic encephalopathy.   Patient placed on CIWA protocol earlier in admission.  And undergoing current workup for metabolic derangements including B1, B12, ammonia, TSH.   Of note patient is on buprenorphine for chronic substance use disorder and patient has diagnosis of mild cognitive impairment on neuropsychological evaluation examination as outpatient on 12/01/18/2023.  She reports she underwent a stress test last Wednesday at [facility] and since then she has been having ongoing nausea and vomiting after receiving some intake of medication prior to getting on the treadmill.  Assessment & Plan:   Principal Problem:   Elevated troponin I level Active Problems:   Opiate withdrawal (HCC) - possible   Chronic pain syndrome   Benign essential hypertension   History of heroin use   Alcohol use   AKI (acute kidney injury) (HCC)   Hypokalemia   Cellulitis of right index finger   Elevated troponin   Acute metabolic encephalopathy   Nausea and vomiting   Malnutrition of moderate degree  Acute metabolic encephalopathy   Concerning for likely metabolic derangement given no acute focal neurologic deficits and  when re-oriented patient follows commands appropriately.   CT head unremarkable, B12 within normal limits.  ? patient's baseline  mental status given no family, does have history of mild cognitive impairment per chart review, infectious etiology thought less likely given afebrile with no white count however patient does spike fever low threshold to obtain LP to rule out any infectious etiology for cognitive symptoms - TSH 2.3 - Ammonia 30 - Follow-up vitamin B1 ?NOT DONE - Follow-up hepatic function panel UNREMARKABLE   Ongoing nausea vomiting without abdominal pain, currently stable Could be related to potential opiate withdrawal, also consider related to other medication denies any abdominal pain so doubt any pancreatitis (lipase within normal limits), less likelyGI pathology, ACS ruled out, also differential includes potential vitamin deficiency or metabolic derangement (UDS negative), neurologically globally intact with no deficits, concern for possible cellulitis of index finger but no signs of systemic involvement with no feer Per admission patient reports not able to fill new prescription, PDMP reviewed shows valid prescription for buprenorphine from 07/2022 - Continue 4 mg Subutex daily   AKI, likely prerenal in the setting of ongoing nausea, vomiting Patient creatinine 1.7-1, on admission creatinine of 1.22, improved with time and IV fluids - Continue to encourage oral hydration and closely monitor BMP - Not currently needing IV fluids - Holding home ACE  History of alcohol dependence Patient reports less more than a week ago in the setting of recurrent nausea vomiting No obvious signs of alcohol withdrawal. Alcohol level less than 10 on admission - Monitor on CIWA - Decrease Ativan frequency from q1 hours to q6 hrs PRN given ongoing lethargy, closely monitoring continue to resume for withdrawal symptoms though less likely given outside the window for withdrawal      Elevated troponin I level, resolved, non-ACS - Troponin trend negative,  TTE with no acute abnormalities - Started on aspirin during initial  workup -patient remains chest pain-free   History of heroin use and chronic pain syndrome Stable  - Continue Subutex   Benign essential hypertension Patient on ACE/HCTZ.   -Due to her acute kidney injury, will hold this medication.     Cellulitis of right index finger Redness at DIP of right index with blanching erythema, no appreciable tenderness but limited due to fluctuating lethargy. - Started on Keflex on admission for presumed cellulitis     Hypokalemia secondary to ongoing nausea and vomiting, improved - Holding home HCTZ Status post IV potassium repletion, current potassium 5 - Repeat BMP in a.m.   Nutrition Problem: Moderate Malnutrition Etiology: acute illness  Signs/Symptoms: mild fat depletion, mild muscle depletion, energy intake < 75% for > 7 days  Interventions: Refer to RD note for recommendations, Ensure Enlive (each supplement provides 350kcal and 20 grams of protein), MVI  Estimated body mass index is 19.63 kg/m as calculated from the following:   Height as of this encounter: 5\' 6"  (1.676 m).   Weight as of this encounter: 55.2 kg.  DVT prophylaxis: heparin Code Status:full Family Communication: none Disposition Plan:  Status is: Inpatient Remains inpatient appropriate because: snf   Consultants:  none  Procedures:none Antimicrobials: keflex Subjective: C/o numbness b/l anterior thighs for over 1 week  No other c/o  Objective: Vitals:   09/17/22 0505 09/17/22 1554 09/18/22 0427 09/18/22 0436  BP: (!) 137/98 125/88 (!) 144/94   Pulse: 77 82 75   Resp: 20 16 18    Temp: 98 F (36.7 C) 98.3 F (36.8 C) 97.8 F (36.6 C)   TempSrc: Oral Oral Oral   SpO2: 99% 97% 98%   Weight: 58.9 kg   55.2 kg  Height:        Intake/Output Summary (Last 24 hours) at 09/18/2022 1118 Last data filed at 09/17/2022 2030 Gross per 24 hour  Intake 720 ml  Output --  Net 720 ml   Filed Weights   09/16/22 0500 09/17/22 0505 09/18/22 0436  Weight: 58.8 kg  58.9 kg 55.2 kg    Examination:  General exam: Appears chronically ill Respiratory system: Clear to auscultation. Respiratory effort normal. Cardiovascular system: S1 & S2 heard, RRR. No JVD, murmurs, rubs, gallops or clicks. No pedal edema. Gastrointestinal system: Abdomen is nondistended, soft and nontender. No organomegaly or masses felt. Normal bowel sounds heard. Central nervous system: Alert and oriented. No focal neurological deficits. Extremities: trace edema    Data Reviewed: I have personally reviewed following labs and imaging studies  CBC: Recent Labs  Lab 09/12/22 1355 09/13/22 0959 09/14/22 0423 09/15/22 0359  WBC 7.5 7.7 4.8 6.5  NEUTROABS  --  5.2  --   --   HGB 13.6 11.5* 10.4* 11.2*  HCT 38.2 33.5* 30.4* 33.5*  MCV 101.3* 103.7* 103.4* 104.0*  PLT 244 234 222 161   Basic Metabolic Panel: Recent Labs  Lab 09/12/22 1355 09/13/22 0959 09/14/22 0423 09/15/22 0359  NA 138 135 138 138  K 2.9* 3.0* 5.0 4.4  CL 97* 101 104 105  CO2 23 26 26 22   GLUCOSE 103* 103* 93 107*  BUN 18 20 23* 20  CREATININE 1.22* 1.06* 1.11* 1.00  CALCIUM 8.5* 8.2* 8.6* 9.1  MG  --  1.2* 1.2*  --    GFR: Estimated Creatinine Clearance: 57.3 mL/min (by C-G formula based on SCr of 1 mg/dL). Liver Function Tests: Recent Labs  Lab 09/12/22  1355 09/13/22 0959 09/14/22 1626 09/15/22 0359  AST 40 76* 81* 56*  ALT 20 26 49* 44  ALKPHOS 120 132* 185* 182*  BILITOT 1.0 1.2 0.7 0.5  PROT 6.5 5.6* 5.8* 6.0*  ALBUMIN 3.2* 2.6* 2.7* 2.8*   Recent Labs  Lab 09/12/22 1355  LIPASE 47   Recent Labs  Lab 09/14/22 1626  AMMONIA 30   Coagulation Profile: No results for input(s): "INR", "PROTIME" in the last 168 hours. Cardiac Enzymes: No results for input(s): "CKTOTAL", "CKMB", "CKMBINDEX", "TROPONINI" in the last 168 hours. BNP (last 3 results) No results for input(s): "PROBNP" in the last 8760 hours. HbA1C: No results for input(s): "HGBA1C" in the last 72 hours. CBG: No  results for input(s): "GLUCAP" in the last 168 hours. Lipid Profile: No results for input(s): "CHOL", "HDL", "LDLCALC", "TRIG", "CHOLHDL", "LDLDIRECT" in the last 72 hours. Thyroid Function Tests: No results for input(s): "TSH", "T4TOTAL", "FREET4", "T3FREE", "THYROIDAB" in the last 72 hours. Anemia Panel: No results for input(s): "VITAMINB12", "FOLATE", "FERRITIN", "TIBC", "IRON", "RETICCTPCT" in the last 72 hours. Sepsis Labs: No results for input(s): "PROCALCITON", "LATICACIDVEN" in the last 168 hours.  No results found for this or any previous visit (from the past 240 hour(s)).       Radiology Studies: No results found.      Scheduled Meds:  amLODipine  5 mg Oral Daily   aspirin EC  81 mg Oral Daily   buprenorphine  4 mg Sublingual Daily   cephALEXin  500 mg Oral Q12H   feeding supplement  237 mL Oral BID BM   folic acid  1 mg Oral Daily   heparin  5,000 Units Subcutaneous Q8H   lisinopril  20 mg Oral Daily   multivitamin with minerals  1 tablet Oral Daily   nicotine  14 mg Transdermal Daily   pantoprazole  40 mg Oral Daily   polyethylene glycol  17 g Oral Daily   senna-docusate  1 tablet Oral Daily   thiamine  100 mg Oral Daily   Or   thiamine  100 mg Intravenous Daily   Continuous Infusions:   LOS: 5 days   Time spent:   Alwyn Ren, MD  09/18/2022, 11:18 AM

## 2022-09-18 NOTE — Progress Notes (Signed)
Occupational Therapy Treatment Patient Details Name: [name] MRN: [id number] DOB: 1970/10/22 Today's Date: 09/18/2022   History of present illness Ms. Campi is a 52 yr old female admitted to the hospital with intractable nausea & vomiting for ~1 week. She was found to have elevated troponin, AKI, hypokalemia, possible ETOH withdrawal, and cellulitis of the R index finger. PMH: HTN, substance abuse, ETOH abuse, chronic pain, Crohn's disease, gout   OT comments  Patient demonstrates independent with ADLs today and ambulated in hall approximately 200 feet with walker. She had no loss of balance. She did not require any physical assistance.  From an OT standpoint she has no further needs. She does report continued numbness in hands and overall generalized weakness. 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 Recommend OP OT at discharge to improve strength, endurance and [facility] coordination and to return her to PLOF. Reports her boyfriend can drive her.    Recommendations for follow up therapy are one component of a multi-disciplinary discharge planning process, led by the attending physician.  Recommendations may be updated based on patient status, additional functional criteria and insurance authorization.    Follow Up Recommendations  Outpatient OT     Assistance Recommended at Discharge PRN  Patient can return home with the following  Assistance with cooking/housework;Help with stairs or ramp for entrance   Equipment Recommendations  None recommended by OT    Recommendations for Other Services      Precautions / Restrictions Restrictions Weight Bearing Restrictions: No       Mobility Bed Mobility Overal bed mobility: Modified Independent                  Transfers   Equipment used: Rolling walker (2 wheels)               General transfer comment: Patient ambulated in room and in hall over 200 feet with walker. Therapist adjusted height to suit  her better. No physical assistance. Patient has no loss of balance.     Balance Overall balance assessment: Mild deficits observed, not formally tested                                         ADL either performed or assessed with clinical judgement   ADL Overall ADL's : Independent                                     Functional mobility during ADLs: Rolling walker (2 wheels)      Extremity/Trunk Assessment Upper Extremity Assessment Upper Extremity Assessment: Overall WFL for tasks assessed   Lower Extremity Assessment Lower Extremity Assessment: Defer to PT evaluation   Cervical / Trunk Assessment Cervical / Trunk Assessment: Normal    Vision Patient Visual Report: No change from baseline     Perception     Praxis      Cognition Arousal/Alertness: Awake/alert Behavior During Therapy: WFL for tasks assessed/performed Overall Cognitive Status: Within Functional Limits for tasks assessed                                          Exercises      Shoulder Instructions  General Comments      Pertinent Vitals/ Pain       Pain Assessment Pain Assessment: No/denies pain  Home Living                                          Prior Functioning/Environment              Frequency  Min 2X/week        Progress Toward Goals  OT Goals(current goals can now be found in the care plan section)  Progress towards OT goals: Progressing toward goals  Acute Rehab OT Goals OT Goal Formulation: With patient Time For Goal Achievement: 09/28/22 Potential to Achieve Goals: Good  Plan All goals met and education completed, patient discharged from OT services    Co-evaluation                 AM-PAC OT "6 Clicks" Daily Activity     Outcome Measure   Help from another person eating meals?: None Help from another person taking care of personal grooming?: None Help from another person  toileting, which includes using toliet, bedpan, or urinal?: None Help from another person bathing (including washing, rinsing, drying)?: None Help from another person to put on and taking off regular upper body clothing?: None Help from another person to put on and taking off regular lower body clothing?: None 6 Click Score: 24    End of Session Equipment Utilized During Treatment: Rolling walker (2 wheels)  OT Visit Diagnosis: Unsteadiness on feet (R26.81);Muscle weakness (generalized) (M62.81)   Activity Tolerance Patient tolerated treatment well   Patient Left in bed;with call bell/phone within reach;with bed alarm set   Nurse Communication Mobility status        Time: 5573-2202 OT Time Calculation (min): 15 min  Charges: OT General Charges $OT Visit: 1 Visit  Gustavo Lah, OTR/L [place]  Office [phone]   Lenward Chancellor 09/18/2022, 3:48 PM

## 2022-09-19 ENCOUNTER — Other Ambulatory Visit: Payer: Self-pay

## 2022-09-19 DIAGNOSIS — R7989 Other specified abnormal findings of blood chemistry: Secondary | ICD-10-CM | POA: Diagnosis not present

## 2022-09-19 LAB — COMPREHENSIVE METABOLIC PANEL
ALT: 25 U/L (ref 0–44)
AST: 23 U/L (ref 15–41)
Albumin: 3.1 g/dL — ABNORMAL LOW (ref 3.5–5.0)
Alkaline Phosphatase: 110 U/L (ref 38–126)
Anion gap: 11 (ref 5–15)
BUN: 19 mg/dL (ref 6–20)
CO2: 23 mmol/L (ref 22–32)
Calcium: 9.7 mg/dL (ref 8.9–10.3)
Chloride: 100 mmol/L (ref 98–111)
Creatinine, Ser: 1.28 mg/dL — ABNORMAL HIGH (ref 0.44–1.00)
GFR, Estimated: 50 mL/min — ABNORMAL LOW (ref >60)
Glucose, Bld: 100 mg/dL — ABNORMAL HIGH (ref 70–99)
Potassium: 4.9 mmol/L (ref 3.5–5.1)
Sodium: 134 mmol/L — ABNORMAL LOW (ref 135–145)
Total Bilirubin: 0.5 mg/dL (ref 0.3–1.2)
Total Protein: 5.9 g/dL — ABNORMAL LOW (ref 6.5–8.1)

## 2022-09-19 LAB — CBC
HCT: 34.7 % — ABNORMAL LOW (ref 36.0–46.0)
Hemoglobin: 11.5 g/dL — ABNORMAL LOW (ref 12.0–15.0)
MCH: 35 pg — ABNORMAL HIGH (ref 26.0–34.0)
MCHC: 33.1 g/dL (ref 30.0–36.0)
MCV: 105.5 fL — ABNORMAL HIGH (ref 80.0–100.0)
Platelets: 348 10*3/uL (ref 150–400)
RBC: 3.29 MIL/uL — ABNORMAL LOW (ref 3.87–5.11)
RDW: 14.3 % (ref 11.5–15.5)
WBC: 7.6 10*3/uL (ref 4.0–10.5)
nRBC: 0 % (ref 0.0–0.2)

## 2022-09-19 LAB — VITAMIN B1: Vitamin B1 (Thiamine): 146.4 nmol/L (ref 66.5–200.0)

## 2022-09-19 MED ORDER — ASPIRIN 81 MG PO TBEC
81.0000 mg | DELAYED_RELEASE_TABLET | Freq: Every day | ORAL | 12 refills | Status: DC
  Filled 2022-09-19: qty 100, 100d supply, fill #0

## 2022-09-19 MED ORDER — FOLIC ACID 1 MG PO TABS: 1.0000 mg | ORAL_TABLET | Freq: Every day | ORAL | Status: DC

## 2022-09-19 MED ORDER — AMLODIPINE BESYLATE 5 MG PO TABS
2.5000 mg | ORAL_TABLET | Freq: Every day | ORAL | 1 refills | Status: DC
  Filled 2022-09-19: qty 30, 60d supply, fill #0

## 2022-09-19 MED ORDER — ONDANSETRON HCL 4 MG PO TABS
4.0000 mg | ORAL_TABLET | Freq: Four times a day (QID) | ORAL | 0 refills | Status: DC | PRN
  Filled 2022-09-19: qty 20, 5d supply, fill #0

## 2022-09-19 MED ORDER — NICOTINE 14 MG/24HR TD PT24
14.0000 mg | MEDICATED_PATCH | Freq: Every day | TRANSDERMAL | 0 refills | Status: DC
  Filled 2022-09-19: qty 28, 28d supply, fill #0

## 2022-09-19 MED ORDER — VITAMIN B-1 100 MG PO TABS: 100.0000 mg | ORAL_TABLET | Freq: Every day | ORAL | Status: DC

## 2022-09-19 MED ORDER — POLYETHYLENE GLYCOL 3350 17 G PO PACK
17.0000 g | PACK | Freq: Every day | ORAL | 0 refills | Status: DC
  Filled 2022-09-19: qty 14, 14d supply, fill #0

## 2022-09-19 MED ORDER — SENNOSIDES-DOCUSATE SODIUM 8.6-50 MG PO TABS: 1.0000 | ORAL_TABLET | Freq: Every day | ORAL | Status: DC

## 2022-09-19 NOTE — Discharge Summary (Signed)
Physician Discharge Summary  [name] GQB:[id number] DOB: 1971-08-12 DOA: 09/12/2022  [facility]  Admit date: 09/12/2022 Discharge date: 09/19/2022  Admitted From: Home Disposition: Home  Recommendations for Outpatient Follow-up:  Follow up with PCP in 1-2 weeks Please obtain BMP/CBC in one week  Home Health:outpatient pt Equipment/Devices:none Discharge Condition:stable CODE STATUS:full Diet recommendation: cardiac Brief/Interim Summary:51 y.o. female with a history of heroin use on chronic buprenorphine, hypertension, alcohol use and mild cognitive impairment. Patient presented secondary to nausea/vomiting in addition to chest pain. Chest pain workup started showed troponin peak of 70 before downtrending, EKG showed normal sinus rhythm with no acute ischemic changes, and TTE showed preserved EF with no wall motion abnormalities.  Hospital course complicated by ongoing lethargy in the setting of reported nausea, vomiting and paresthesia concerning for acute metabolic encephalopathy.   Patient placed on CIWA protocol earlier in admission.  And undergoing current workup for metabolic derangements including B1, B12, ammonia, TSH.   Of note patient is on buprenorphine for chronic substance use disorder and patient has diagnosis of mild cognitive impairment on neuropsychological evaluation examination as outpatient on 12/01/18/2023.  She reports she underwent a stress test last Wednesday at [facility] and since then she has been having ongoing nausea and vomiting after receiving some intake of medication prior to getting on the treadmill.  Discharge Diagnoses:  Principal Problem:   Elevated troponin I level Active Problems:   Opiate withdrawal (HCC) - possible   Chronic pain syndrome   Benign essential hypertension   History of heroin use   Alcohol use   AKI (acute kidney injury) (HCC)   Hypokalemia   Cellulitis of right index finger   Elevated troponin    Acute metabolic encephalopathy   Nausea and vomiting   Malnutrition of moderate degree  Acute metabolic encephalopathy-of unclear etiology.  It was thought to be likely metabolic derangement given no acute focal neurologic deficits and  when re-oriented patient follows commands appropriately.   CT head unremarkable, B12 within normal limits.  ? patient's baseline mental status given no family, does have history of mild cognitive impairment per chart review, infectious etiology thought less likely given afebrile with no white count ammonia normal TSH normal- TSH 2.3   Ongoing nausea vomiting without abdominal pain, currently stable and improved - Continue 4 mg Subutex daily   AKI, likely prerenal in the setting of ongoing nausea, vomiting, ACE inhibitor and hydrochlorothiazide and soft blood pressure.  On discharge HCTZ and ACE inhibitor's have been stopped.  She was placed on 2.5 mg of Norvasc on discharge.   History of alcohol dependence Alcohol level less than 10 on admission    Elevated troponin I level, resolved, non-ACS - Troponin trend negative, TTE with no acute abnormalities - Started on aspirin    History of heroin use and chronic pain syndrome Stable  - Continue Subutex   Benign essential hypertension-continue low-dose Norvasc 2.5 mg daily this was started during the stay.  Cellulitis of right index finger Redness at DIP of right index with blanching erythema, no appreciable tenderness but limited due to fluctuating lethargy. - Finished a course of keflex Hypokalemia -resolved.  Secondary to ongoing nausea and vomiting, hydrochlorothiazide.  Nutrition Problem: Moderate Malnutrition Etiology: acute illness    Signs/Symptoms: mild fat depletion, mild muscle depletion, energy intake < 75% for > 7 days     Interventions: Refer to RD note for recommendations, Ensure Enlive (each supplement provides 350kcal and 20 grams of protein), MVI  Estimated body mass index is  19.68 kg/m as calculated from the following:   Height as of this encounter: 5\' 6"  (1.676 m).   Weight as of this encounter: 55.3 kg.  Discharge Instructions  Discharge Instructions     Ambulatory referral to Physical Therapy   Complete by: As directed    Diet - low sodium heart healthy   Complete by: As directed    Increase activity slowly   Complete by: As directed       Allergies as of 09/19/2022       Reactions   Penicillins Hives   Did it involve swelling of the face/tongue/throat, SOB, or low BP? No Did it involve sudden or severe rash/hives, skin peeling, or any reaction on the inside of your mouth or nose? Yes Did you need to seek medical attention at a hospital or doctor's office? Unknown When did it last happen?    childhood   If all above answers are "NO", may proceed with cephalosporin use.        Medication List     STOP taking these medications    cephALEXin 500 MG capsule Commonly known as: KEFLEX   doxycycline 100 MG tablet Commonly known as: VIBRA-TABS   ondansetron 4 MG disintegrating tablet Commonly known as: ZOFRAN-ODT   oxyCODONE-acetaminophen 5-325 MG tablet Commonly known as: PERCOCET/ROXICET   potassium chloride 10 MEQ tablet Commonly known as: KLOR-CON   traMADol 50 MG tablet Commonly known as: ULTRAM       TAKE these medications    aspirin EC 81 MG tablet Take 1 tablet (81 mg total) by mouth daily. Swallow whole.   Blood Pressure Kit Kit 1 (one) kit daily   buprenorphine 8 MG Subl SL tablet Commonly known as: SUBUTEX Place 0.5 tablets (4 mg total) under the tongue daily.   cholecalciferol 25 MCG (1000 UNIT) tablet Commonly known as: VITAMIN D3 Take 1,000 Units by mouth daily.   esomeprazole 20 MG capsule Commonly known as: NEXIUM Take 20 mg by mouth daily at 12 noon.   folic acid 1 MG tablet Commonly known as: FOLVITE Take 1 tablet (1 mg total) by mouth daily.   lisinopril-hydrochlorothiazide 20-25 MG  tablet Commonly known as: ZESTORETIC Take 1 tablet by mouth daily   nicotine 14 mg/24hr patch Commonly known as: NICODERM CQ - dosed in mg/24 hours Place 1 patch (14 mg total) onto the skin daily.   ondansetron 4 MG tablet Commonly known as: ZOFRAN Take 1 tablet (4 mg total) by mouth every 6 (six) hours as needed for nausea.   polyethylene glycol 17 g packet Commonly known as: MIRALAX / GLYCOLAX Take 17 g by mouth daily.   senna-docusate 8.6-50 MG tablet Commonly known as: Senokot-S Take 1 tablet by mouth daily.   thiamine 100 MG tablet Commonly known as: Vitamin B-1 Take 1 tablet (100 mg total) by mouth daily.        [facility] Follow up.   Contact information: [place] [address] [phone]         [facility] Follow up.   Specialty: Rehabilitation Why: They will follow up with you after discharge. Contact information: [address] [id number] m[place] [address] [phone]               Allergies  Allergen Reactions   Penicillins Hives    Did it involve swelling of the face/tongue/throat, SOB,  or low BP? No Did it involve sudden or severe rash/hives, skin peeling, or any reaction on the inside of your mouth or nose? Yes Did you need to seek medical attention at a hospital or doctor's office? Unknown When did it last happen?    childhood   If all above answers are "NO", may proceed with cephalosporin use.      Consultations: None   Procedures/Studies: ECHOCARDIOGRAM COMPLETE  Result Date: 09/13/2022    ECHOCARDIOGRAM REPORT   Patient Name:   [name] Date of Exam: 09/13/2022 Medical Rec #:  [id number]       Height:       66.0 in Accession #:    [id number]      Weight:       136.6 lb Date of Birth:  08-11-1971       BSA:          1.701 m Patient Age:    81 years        BP:           131/99 mmHg Patient  Gender: F               HR:           79 bpm. Exam Location:  Inpatient Procedure: 2D Echo, Cardiac Doppler and Color Doppler Indications:    elevated troponin  History:        Patient has no prior history of Echocardiogram examinations.                 Risk Factors:Hypertension.  Sonographer:    Phineas Douglas Referring Phys: [phone] [place]  1. Left ventricular ejection fraction, by estimation, is 70 to 75%. The left ventricle has hyperdynamic function. The left ventricle has no regional wall motion abnormalities. There is mild left ventricular hypertrophy. Left ventricular diastolic parameters were normal.  2. Right ventricular systolic function is normal. The right ventricular size is normal.  3. The mitral valve is normal in structure. Trivial mitral valve regurgitation.  4. The aortic valve is tricuspid. Aortic valve regurgitation is not visualized.  5. The inferior vena cava is normal in size with greater than 50% respiratory variability, suggesting right atrial pressure of 3 mmHg. FINDINGS  Left Ventricle: Left ventricular ejection fraction, by estimation, is 70 to 75%. The left ventricle has hyperdynamic function. The left ventricle has no regional wall motion abnormalities. The left ventricular internal cavity size was normal in size. There is mild left ventricular hypertrophy. Left ventricular diastolic parameters were normal. Right Ventricle: The right ventricular size is normal. Right vetricular wall thickness was not assessed. Right ventricular systolic function is normal. Left Atrium: Left atrial size was normal in size. Right Atrium: Right atrial size was normal in size. Pericardium: There is no evidence of pericardial effusion. Mitral Valve: The mitral valve is normal in structure. Trivial mitral valve regurgitation. Tricuspid Valve: The tricuspid valve is normal in structure. Tricuspid valve regurgitation is mild. Aortic Valve: The aortic valve is tricuspid. Aortic valve regurgitation is  not visualized. Pulmonic Valve: The pulmonic valve was not well visualized. Pulmonic valve regurgitation is not visualized. No evidence of pulmonic stenosis. Aorta: The aortic root and ascending aorta are structurally normal, with no evidence of dilitation. Venous: The inferior vena cava is normal in size with greater than 50% respiratory variability, suggesting right atrial pressure of 3 mmHg. IAS/Shunts: No atrial level shunt detected by color flow Doppler.  LEFT VENTRICLE PLAX 2D LVIDd:  4.10 cm     Diastology LVIDs:         2.00 cm     LV e' medial:    4.38 cm/s LV PW:         1.20 cm     LV E/e' medial:  20.7 LV IVS:        1.20 cm     LV e' lateral:   4.73 cm/s LVOT diam:     2.00 cm     LV E/e' lateral: 19.2 LV SV:         68 LV SV Index:   40 LVOT Area:     3.14 cm  LV Volumes (MOD) LV vol d, MOD A2C: 69.9 ml LV vol d, MOD A4C: 83.4 ml LV vol s, MOD A2C: 24.9 ml LV vol s, MOD A4C: 31.6 ml LV SV MOD A2C:     45.0 ml LV SV MOD A4C:     83.4 ml LV SV MOD BP:      48.6 ml RIGHT VENTRICLE             IVC RV Basal diam:  3.40 cm     IVC diam: 1.70 cm RV S prime:     13.30 cm/s TAPSE (M-mode): 1.9 cm LEFT ATRIUM             Index        RIGHT ATRIUM           Index LA diam:        2.35 cm 1.38 cm/m   RA Area:     13.00 cm LA Vol (A2C):   38.1 ml 22.40 ml/m  RA Volume:   28.40 ml  16.70 ml/m LA Vol (A4C):   24.1 ml 14.17 ml/m LA Biplane Vol: 32.4 ml 19.05 ml/m  AORTIC VALVE LVOT Vmax:   110.00 cm/s LVOT Vmean:  77.700 cm/s LVOT VTI:    0.217 m  AORTA Ao Root diam: 3.30 cm Ao Asc diam:  3.20 cm MITRAL VALVE MV Area (PHT): 3.21 cm    SHUNTS MV Decel Time: 236 msec    Systemic VTI:  0.22 m MV E velocity: 90.60 cm/s  Systemic Diam: 2.00 cm MV A velocity: 97.90 cm/s MV E/A ratio:  0.93 Dietrich Pates MD Electronically signed by Dietrich Pates MD Signature Date/Time: 09/13/2022/3:18:34 PM    Final    CT HEAD WO CONTRAST ( )  Result Date: 09/12/2022 CLINICAL DATA:  Nausea, vomiting, and weakness. Numbness and  tingling in all extremities. Alcohol withdrawal. EXAM: CT HEAD WITHOUT CONTRAST TECHNIQUE: Contiguous axial images were obtained from the base of the skull through the vertex without intravenous contrast. RADIATION DOSE REDUCTION: This exam was performed according to the departmental dose-optimization program which includes automated exposure control, adjustment of the mA and/or kV according to patient size and/or use of iterative reconstruction technique. COMPARISON:  MRI brain dated July 11, 2021. CT head dated October 16, 2007. FINDINGS: Brain: No evidence of acute infarction, hemorrhage, hydrocephalus, extra-axial collection or mass lesion/mass effect. Prominent perivascular space in the left basal ganglia again noted. Vascular: No hyperdense vessel or unexpected calcification. Skull: Normal. Negative for fracture or focal lesion. Sinuses/Orbits: No acute finding. Other: None. IMPRESSION: 1. No acute intracranial abnormality. Electronically Signed   By: Obie Dredge M.D.   On: 09/12/2022 18:28   CT Renal Stone Study  Result Date: 08/24/2022 CLINICAL DATA:  Left flank pain and vomiting for 2 days. History of kidney stones. EXAM: CT ABDOMEN  AND PELVIS WITHOUT CONTRAST TECHNIQUE: Multidetector CT imaging of the abdomen and pelvis was performed following the standard protocol without IV contrast. RADIATION DOSE REDUCTION: This exam was performed according to the departmental dose-optimization program which includes automated exposure control, adjustment of the mA and/or kV according to patient size and/or use of iterative reconstruction technique. COMPARISON:  04/09/2021. FINDINGS: Lower chest: Stable atelectasis or scarring at the left lung base. Hepatobiliary: There is fatty infiltration of the liver with focal fatty infiltration in the left lobe of the liver adjacent to the falciform ligament. No biliary ductal dilatation. The gallbladder is without stones. Pancreas: Unremarkable. No pancreatic ductal  dilatation or surrounding inflammatory changes. Spleen: Stable hypodensity with calcifications in the spleen, likely cyst or hemangioma. The spleen is normal in size. Adrenals/Urinary Tract: No adrenal nodule or mass. Renal cortical scarring is noted on the right. Renal calculi are noted bilaterally. No hydroureteronephrosis. The bladder is unremarkable. Stomach/Bowel: Stomach is within normal limits. Appendix is not seen. No evidence of bowel wall thickening, distention, or acute inflammatory changes. Fatty infiltration of the walls of the colon is noted, compatible with chronic inflammatory changes. No free air or pneumatosis. Vascular/Lymphatic: Aortic atherosclerosis. No enlarged abdominal or pelvic lymph nodes. Reproductive: Uterus and bilateral adnexa are unremarkable. Other: No abdominopelvic ascites. Small fat containing umbilical hernia. Musculoskeletal: Mild degenerative changes in the lumbar spine. No acute osseous abnormality. IMPRESSION: 1. Bilateral nephrolithiasis with no hydroureteronephrosis bilaterally. 2. Hepatic steatosis. 3. Aortic atherosclerosis. Electronically Signed   By: Thornell Sartorius M.D.   On: 08/24/2022 00:53   (Echo, Carotid, EGD, Colonoscopy, ERCP)    Subjective:  She is resting in bed anxious to go home no specific complaints  she does not want to go to rehab Discharge Exam: Vitals:   09/19/22 0844 09/19/22 0915  BP:  121/82  Pulse:    Resp: 17   Temp:    SpO2:     Vitals:   09/19/22 0626 09/19/22 0759 09/19/22 0844 09/19/22 0915  BP: 119/79   121/82  Pulse: 82     Resp: 18 19 17    Temp: 98.2 F (36.8 C)     TempSrc: Oral     SpO2: 98%     Weight:      Height:        General: Pt is alert, awake, not in acute distress Cardiovascular: RRR, S1/S2 +, no rubs, no gallops Respiratory: CTA bilaterally, no wheezing, no rhonchi Abdominal: Soft, NT, ND, bowel sounds + Extremities: no edema, no cyanosis    The results of significant diagnostics from this  hospitalization (including imaging, microbiology, ancillary and laboratory) are listed below for reference.     Microbiology: No results found for this or any previous visit (from the past 240 hour(s)).   Labs: BNP (last 3 results) No results for input(s): "BNP" in the last 8760 hours. Basic Metabolic Panel: Recent Labs  Lab 09/12/22 1355 09/13/22 0959 09/14/22 0423 09/15/22 0359 09/19/22 0346  NA 138 135 138 138 134*  K 2.9* 3.0* 5.0 4.4 4.9  CL 97* 101 104 105 100  CO2 23 26 26 22 23   GLUCOSE 103* 103* 93 107* 100*  BUN 18 20 23* 20 19  CREATININE 1.22* 1.06* 1.11* 1.00 1.28*  CALCIUM 8.5* 8.2* 8.6* 9.1 9.7  MG  --  1.2* 1.2*  --   --    Liver Function Tests: Recent Labs  Lab 09/12/22 1355 09/13/22 0959 09/14/22 1626 09/15/22 0359 09/19/22 0346  AST 40 76* 81*  56* 23  ALT 20 26 49* 44 25  ALKPHOS 120 132* 185* 182* 110  BILITOT 1.0 1.2 0.7 0.5 0.5  PROT 6.5 5.6* 5.8* 6.0* 5.9*  ALBUMIN 3.2* 2.6* 2.7* 2.8* 3.1*   Recent Labs  Lab 09/12/22 1355  LIPASE 47   Recent Labs  Lab 09/14/22 1626  AMMONIA 30   CBC: Recent Labs  Lab 09/12/22 1355 09/13/22 0959 09/14/22 0423 09/15/22 0359 09/19/22 0346  WBC 7.5 7.7 4.8 6.5 7.6  NEUTROABS  --  5.2  --   --   --   HGB 13.6 11.5* 10.4* 11.2* 11.5*  HCT 38.2 33.5* 30.4* 33.5* 34.7*  MCV 101.3* 103.7* 103.4* 104.0* 105.5*  PLT 244 234 222 258 348   Cardiac Enzymes: No results for input(s): "CKTOTAL", "CKMB", "CKMBINDEX", "TROPONINI" in the last 168 hours. BNP: Invalid input(s): "POCBNP" CBG: No results for input(s): "GLUCAP" in the last 168 hours. D-Dimer No results for input(s): "DDIMER" in the last 72 hours. Hgb A1c No results for input(s): "HGBA1C" in the last 72 hours. Lipid Profile No results for input(s): "CHOL", "HDL", "LDLCALC", "TRIG", "CHOLHDL", "LDLDIRECT" in the last 72 hours. Thyroid function studies No results for input(s): "TSH", "T4TOTAL", "T3FREE", "THYROIDAB" in the last 72  hours.  Invalid input(s): "FREET3" Anemia work up No results for input(s): "VITAMINB12", "FOLATE", "FERRITIN", "TIBC", "IRON", "RETICCTPCT" in the last 72 hours. Urinalysis    Component Value Date/Time   COLORURINE YELLOW 09/12/2022 1355   APPEARANCEUR TURBID (A) 09/12/2022 1355   LABSPEC 1.017 09/12/2022 1355   PHURINE 6.0 09/12/2022 1355   GLUCOSEU NEGATIVE 09/12/2022 1355   HGBUR SMALL (A) 09/12/2022 1355   BILIRUBINUR NEGATIVE 09/12/2022 1355   BILIRUBINUR moderate (A) 03/24/2021 0943   KETONESUR NEGATIVE 09/12/2022 1355   PROTEINUR 100 (A) 09/12/2022 1355   UROBILINOGEN 2.0 (A) 03/24/2021 0943   UROBILINOGEN 0.2 04/24/2009 0504   NITRITE NEGATIVE 09/12/2022 1355   LEUKOCYTESUR MODERATE (A) 09/12/2022 1355   Sepsis Labs Recent Labs  Lab 09/13/22 0959 09/14/22 0423 09/15/22 0359 09/19/22 0346  WBC 7.7 4.8 6.5 7.6   Microbiology No results found for this or any previous visit (from the past 240 hour(s)).   Time coordinating discharge: 39 minutes  SIGNED:  Alwyn Ren, MD  [facility] 09/19/2022, 1:31 PM

## 2022-09-19 NOTE — Plan of Care (Signed)

## 2022-09-19 NOTE — TOC Transition Note (Signed)
Transition of Care [facility]) - CM/SW Discharge Note   Patient Details  Name: [name] MRN: [id number] Date of Birth: [birth year]  [facility] of Care [facility]) CM/SW Contact:  Illene Regulus, LCSW Phone Number: 09/19/2022, 10:36 AM   Clinical Narrative:    [name] has declined and [facility] has declined. Pt will need to follow up with OP PT, has been added to pt's AVS.    Final next level of care: Home/Self Care Barriers to Discharge: No Barriers Identified   Patient Goals and CMS Choice CMS Medicare.gov Compare Post Acute Care list provided to:: Patient Choice offered to / list presented to : Patient  Discharge Placement                      Patient and family notified of of transfer: 09/19/22  Discharge Plan and Services Additional resources added to the After Visit Summary for                                       Social Determinants of Health (SDOH) Interventions SDOH Screenings   Food Insecurity: Food Insecurity Present (09/13/2022)  Housing: High Risk (09/13/2022)  Transportation Needs: Unmet Transportation Needs (09/13/2022)  Utilities: At Risk (09/13/2022)  Alcohol Screen: Low Risk  (04/22/2021)  Depression (PHQ2-9): Low Risk  (01/04/2022)  Tobacco Use: High Risk (09/12/2022)     Readmission Risk Interventions     No data to display

## 2022-09-19 NOTE — Progress Notes (Signed)
Physical Therapy Treatment Patient Details Name: [name] MRN: [id number] DOB: 12/01/1970 Today's Date: 09/19/2022   History of Present Illness [name] is a 52 yr old female admitted to the hospital with intractable nausea & vomiting for ~1 week. She was found to have elevated troponin, AKI, hypokalemia, possible ETOH withdrawal, and cellulitis of the R index finger. PMH: HTN, substance abuse, ETOH abuse, chronic pain, Crohn's disease, gout    PT Comments    Pt seen for PT tx with pt agreeable. Pt reports she resides with her boyfriend in a 1 level home with 2-4 steps with wide set B rails to enter. On this date, pt attempts transfers & gait without AD but demonstrates slow, guarded movements, requiring min assist. Provided pt with RW & pt ambulates increased distances with supervision. Pt endorses new medial L knee pain & demonstrates decreased weight shifting LLE during gait/stance phase. Pt performs strengthening exercise/activity, demonstrating decreased proximal BLE strength. Pt agreeable to OPPT f/u upon d/c, but declines RW as she has a rollator & has used it in the past & feels comfort with AD.    Recommendations for follow up therapy are one component of a multi-disciplinary discharge planning process, led by the attending physician.  Recommendations may be updated based on patient status, additional functional criteria and insurance authorization.  Follow Up Recommendations  Outpatient PT Can patient physically be transported by private vehicle: Yes   Assistance Recommended at Discharge Set up Supervision/Assistance  Patient can return home with the following A little help with walking and/or transfers;A little help with bathing/dressing/bathroom;Assistance with cooking/housework;Help with stairs or ramp for entrance   Equipment Recommendations  None recommended by PT (pt already has rollator)    Recommendations for Other Services       Precautions / Restrictions  Precautions Precautions: Fall Restrictions Weight Bearing Restrictions: No     Mobility  Bed Mobility Overal bed mobility: Modified Independent       Supine to sit: Modified independent (Device/Increase time), HOB elevated Sit to supine: Modified independent (Device/Increase time), HOB elevated        Transfers Overall transfer level: Needs assistance Equipment used: None Transfers: Sit to/from Stand Sit to Stand: Supervision   Step pivot transfers: Min assist, Min guard (bed>recliner without AD min assist, fade to CGA during recliner>bed at end of session)            Ambulation/Gait Ambulation/Gait assistance: Min assist, Supervision Gait Distance (Feet): 5 Feet (+ 150 ft) Assistive device: Rolling walker (2 wheels), None Gait Pattern/deviations: Decreased stance time - left, Decreased weight shift to left, Decreased step length - right, Decreased step length - left Gait velocity: slightly decreased     General Gait Details: Pt ambulatse a few steps without AD with min assist with slow, guarded gait. Pt is able to ambulate increased distances with RW & supervision but still with decreased weight shift to L.   Stairs             Wheelchair Mobility    Modified Rankin (Stroke Patients Only)       Balance Overall balance assessment: Needs assistance Sitting-balance support: Feet supported, No upper extremity supported Sitting balance-Leahy Scale: Good     Standing balance support: No upper extremity supported, During functional activity Standing balance-Leahy Scale: Fair                              Cognition Arousal/Alertness: Awake/alert Behavior  During Therapy: WFL for tasks assessed/performed Overall Cognitive Status: Within Functional Limits for tasks assessed                         Following Commands: Follows one step commands consistently                Exercises Other Exercises Other Exercises: Pt  performed 5x STS from recliner with min assist with good demo of anterior weight shift, wide BOS, cuing for increased hip extensor/glute activation to shift pelvis anteriorly & come to full upright standing. Activity focused on BLE strengthening & endurance training.    General Comments        Pertinent Vitals/Pain Pain Assessment Pain Assessment: 0-10 Pain Score: 9  Pain Location: medial aspect of L knee Pain Descriptors / Indicators: Discomfort, Tingling Pain Intervention(s): Monitored during session    Home Living                          Prior Function            PT Goals (current goals can now be found in the care plan section) Acute Rehab PT Goals PT Goal Formulation: With patient Time For Goal Achievement: 09/28/22 Potential to Achieve Goals: Good Progress towards PT goals: Progressing toward goals    Frequency    Min 2X/week      PT Plan Discharge plan needs to be updated;Equipment recommendations need to be updated    Co-evaluation              AM-PAC PT "6 Clicks" Mobility   Outcome Measure  Help needed turning from your back to your side while in a flat bed without using bedrails?: None Help needed moving from lying on your back to sitting on the side of a flat bed without using bedrails?: None Help needed moving to and from a bed to a chair (including a wheelchair)?: A Little Help needed standing up from a chair using your arms (e.g., wheelchair or bedside chair)?: A Little Help needed to walk in hospital room?: A Little Help needed climbing 3-5 steps with a railing? : A Little 6 Click Score: 20    End of Session   Activity Tolerance: Patient tolerated treatment well;Patient limited by fatigue Patient left: in bed;with call bell/phone within reach Nurse Communication: Mobility status PT Visit Diagnosis: Unsteadiness on feet (R26.81);Difficulty in walking, not elsewhere classified (R26.2);Muscle weakness (generalized)  (M62.81);Pain Pain - Right/Left: Left Pain - part of body: Knee     Time: 4098-1191 PT Time Calculation (min) (ACUTE ONLY): 12 min  Charges:  $Therapeutic Activity: 8-22 mins                     Lavone Nian, PT, DPT 09/19/22, 9:52 AM   Waunita Schooner 09/19/2022, 9:49 AM

## 2022-09-21 ENCOUNTER — Other Ambulatory Visit: Payer: Self-pay

## 2022-09-22 ENCOUNTER — Other Ambulatory Visit: Payer: Self-pay

## 2022-10-05 ENCOUNTER — Other Ambulatory Visit: Payer: Self-pay

## 2022-10-05 MED ORDER — TRAMADOL HCL 50 MG PO TABS
50.0000 mg | ORAL_TABLET | Freq: Three times a day (TID) | ORAL | 0 refills | Status: DC | PRN
  Filled 2022-10-05: qty 180, 30d supply, fill #0

## 2022-10-06 ENCOUNTER — Other Ambulatory Visit: Payer: Self-pay

## 2022-10-06 MED ORDER — BUPRENORPHINE HCL 8 MG SL SUBL: 4.0000 mg | SUBLINGUAL_TABLET | Freq: Every day | SUBLINGUAL | 0 refills | Status: DC

## 2022-10-07 ENCOUNTER — Other Ambulatory Visit: Payer: Self-pay

## 2022-11-03 ENCOUNTER — Other Ambulatory Visit: Payer: Self-pay

## 2022-11-03 MED ORDER — TRAMADOL HCL 50 MG PO TABS
50.0000 mg | ORAL_TABLET | Freq: Three times a day (TID) | ORAL | 0 refills | Status: DC | PRN
  Filled 2022-11-03: qty 180, 30d supply, fill #0

## 2022-11-03 MED ORDER — BUPRENORPHINE HCL 8 MG SL SUBL
4.0000 mg | SUBLINGUAL_TABLET | Freq: Two times a day (BID) | SUBLINGUAL | 0 refills | Status: DC
  Filled 2022-11-03: qty 30, 30d supply, fill #0

## 2022-11-03 MED ORDER — D 10000 250 MCG (10000 UT) PO CAPS: ORAL_CAPSULE | ORAL | 3 refills | Status: DC

## 2022-11-03 MED ORDER — LISINOPRIL-HYDROCHLOROTHIAZIDE 20-25 MG PO TABS
1.0000 | ORAL_TABLET | Freq: Every day | ORAL | 1 refills | Status: DC
  Filled 2022-11-03: qty 90, 90d supply, fill #0

## 2022-12-05 ENCOUNTER — Other Ambulatory Visit: Payer: Self-pay

## 2022-12-05 MED ORDER — TRAMADOL HCL 50 MG PO TABS
50.0000 mg | ORAL_TABLET | Freq: Three times a day (TID) | ORAL | 0 refills | Status: DC | PRN
  Filled 2022-12-05: qty 180, 30d supply, fill #0

## 2022-12-05 MED ORDER — BUPRENORPHINE HCL 8 MG SL SUBL
4.0000 mg | SUBLINGUAL_TABLET | Freq: Two times a day (BID) | SUBLINGUAL | 0 refills | Status: DC
  Filled 2022-12-05: qty 30, 30d supply, fill #0

## 2022-12-09 ENCOUNTER — Other Ambulatory Visit: Payer: Self-pay

## 2022-12-09 MED ORDER — AMLODIPINE BESYLATE 5 MG PO TABS
2.5000 mg | ORAL_TABLET | Freq: Every day | ORAL | 2 refills | Status: DC
  Filled 2022-12-09: qty 15, 30d supply, fill #0

## 2022-12-09 MED ORDER — PRAVASTATIN SODIUM 20 MG PO TABS
20.0000 mg | ORAL_TABLET | Freq: Every day | ORAL | 2 refills | Status: DC
  Filled 2022-12-09: qty 30, 30d supply, fill #0
  Filled 2023-04-10: qty 30, 30d supply, fill #1

## 2022-12-15 ENCOUNTER — Other Ambulatory Visit: Payer: Self-pay

## 2023-01-02 ENCOUNTER — Other Ambulatory Visit: Payer: Self-pay

## 2023-01-02 MED ORDER — TRAMADOL HCL 50 MG PO TABS
50.0000 mg | ORAL_TABLET | Freq: Three times a day (TID) | ORAL | 0 refills | Status: DC | PRN
  Filled 2023-01-04: qty 180, 30d supply, fill #0

## 2023-01-02 MED ORDER — BUPRENORPHINE HCL 8 MG SL SUBL
4.0000 mg | SUBLINGUAL_TABLET | Freq: Two times a day (BID) | SUBLINGUAL | 0 refills | Status: DC
  Filled 2023-01-04: qty 30, 30d supply, fill #0

## 2023-01-04 ENCOUNTER — Other Ambulatory Visit: Payer: Self-pay

## 2023-01-05 ENCOUNTER — Other Ambulatory Visit: Payer: Self-pay

## 2023-01-20 ENCOUNTER — Emergency Department (HOSPITAL_COMMUNITY): Payer: Medicaid Other

## 2023-01-20 ENCOUNTER — Encounter (HOSPITAL_COMMUNITY): Payer: Self-pay

## 2023-01-20 ENCOUNTER — Inpatient Hospital Stay (HOSPITAL_COMMUNITY)
Admission: EM | Admit: 2023-01-20 | Discharge: 2023-01-22 | DRG: 690 | Disposition: A | Discharge status: Home / Self Care | Payer: Medicaid Other | Attending: Internal Medicine | Admitting: Internal Medicine

## 2023-01-20 ENCOUNTER — Other Ambulatory Visit: Payer: Self-pay

## 2023-01-20 DIAGNOSIS — B9620 Unspecified Escherichia coli [E. coli] as the cause of diseases classified elsewhere: Secondary | ICD-10-CM | POA: Diagnosis present

## 2023-01-20 DIAGNOSIS — K529 Noninfective gastroenteritis and colitis, unspecified: Secondary | ICD-10-CM

## 2023-01-20 DIAGNOSIS — Z820 Family history of epilepsy and other diseases of the nervous system: Secondary | ICD-10-CM

## 2023-01-20 DIAGNOSIS — Z1611 Resistance to penicillins: Secondary | ICD-10-CM | POA: Diagnosis present

## 2023-01-20 DIAGNOSIS — K219 Gastro-esophageal reflux disease without esophagitis: Secondary | ICD-10-CM | POA: Diagnosis present

## 2023-01-20 DIAGNOSIS — Z880 Allergy status to penicillin: Secondary | ICD-10-CM

## 2023-01-20 DIAGNOSIS — N3090 Cystitis, unspecified without hematuria: Secondary | ICD-10-CM

## 2023-01-20 DIAGNOSIS — Z1623 Resistance to quinolones and fluoroquinolones: Secondary | ICD-10-CM | POA: Diagnosis present

## 2023-01-20 DIAGNOSIS — Z7982 Long term (current) use of aspirin: Secondary | ICD-10-CM

## 2023-01-20 DIAGNOSIS — K5090 Crohn's disease, unspecified, without complications: Secondary | ICD-10-CM | POA: Diagnosis present

## 2023-01-20 DIAGNOSIS — I10 Essential (primary) hypertension: Secondary | ICD-10-CM | POA: Diagnosis present

## 2023-01-20 DIAGNOSIS — R9431 Abnormal electrocardiogram [ECG] [EKG]: Secondary | ICD-10-CM | POA: Diagnosis present

## 2023-01-20 DIAGNOSIS — E440 Moderate protein-calorie malnutrition: Secondary | ICD-10-CM | POA: Diagnosis present

## 2023-01-20 DIAGNOSIS — E876 Hypokalemia: Secondary | ICD-10-CM | POA: Diagnosis present

## 2023-01-20 DIAGNOSIS — Z1629 Resistance to other single specified antibiotic: Secondary | ICD-10-CM | POA: Diagnosis present

## 2023-01-20 DIAGNOSIS — G894 Chronic pain syndrome: Secondary | ICD-10-CM | POA: Diagnosis present

## 2023-01-20 DIAGNOSIS — R112 Nausea with vomiting, unspecified: Secondary | ICD-10-CM | POA: Diagnosis present

## 2023-01-20 DIAGNOSIS — F1121 Opioid dependence, in remission: Secondary | ICD-10-CM | POA: Diagnosis present

## 2023-01-20 DIAGNOSIS — N390 Urinary tract infection, site not specified: Principal | ICD-10-CM | POA: Diagnosis present

## 2023-01-20 DIAGNOSIS — G4733 Obstructive sleep apnea (adult) (pediatric): Secondary | ICD-10-CM | POA: Diagnosis present

## 2023-01-20 DIAGNOSIS — F17210 Nicotine dependence, cigarettes, uncomplicated: Secondary | ICD-10-CM | POA: Diagnosis present

## 2023-01-20 DIAGNOSIS — E785 Hyperlipidemia, unspecified: Secondary | ICD-10-CM | POA: Diagnosis present

## 2023-01-20 DIAGNOSIS — Z8349 Family history of other endocrine, nutritional and metabolic diseases: Secondary | ICD-10-CM

## 2023-01-20 DIAGNOSIS — Z681 Body mass index (BMI) 19 or less, adult: Secondary | ICD-10-CM

## 2023-01-20 DIAGNOSIS — Z79899 Other long term (current) drug therapy: Secondary | ICD-10-CM

## 2023-01-20 LAB — CBC WITH DIFFERENTIAL/PLATELET
Abs Immature Granulocytes: 0.04 10*3/uL (ref 0.00–0.07)
Basophils Absolute: 0 10*3/uL (ref 0.0–0.1)
Basophils Relative: 0 %
Eosinophils Absolute: 0.1 10*3/uL (ref 0.0–0.5)
Eosinophils Relative: 1 %
HCT: 40.8 % (ref 36.0–46.0)
Hemoglobin: 14.4 g/dL (ref 12.0–15.0)
Immature Granulocytes: 0 %
Lymphocytes Relative: 16 %
Lymphs Abs: 1.5 10*3/uL (ref 0.7–4.0)
MCH: 33.3 pg (ref 26.0–34.0)
MCHC: 35.3 g/dL (ref 30.0–36.0)
MCV: 94.2 fL (ref 80.0–100.0)
Monocytes Absolute: 0.7 10*3/uL (ref 0.1–1.0)
Monocytes Relative: 7 %
Neutro Abs: 7.1 10*3/uL (ref 1.7–7.7)
Neutrophils Relative %: 76 %
Platelets: 288 10*3/uL (ref 150–400)
RBC: 4.33 MIL/uL (ref 3.87–5.11)
RDW: 14.9 % (ref 11.5–15.5)
WBC: 9.4 10*3/uL (ref 4.0–10.5)
nRBC: 0 % (ref 0.0–0.2)

## 2023-01-20 LAB — URINALYSIS, ROUTINE W REFLEX MICROSCOPIC
Bilirubin Urine: NEGATIVE
Glucose, UA: NEGATIVE mg/dL
Ketones, ur: 5 mg/dL — AB
Nitrite: POSITIVE — AB
Protein, ur: 30 mg/dL — AB
Specific Gravity, Urine: 1.009 (ref 1.005–1.030)
WBC, UA: >50 WBC/hpf (ref 0–5)
pH: 6 (ref 5.0–8.0)

## 2023-01-20 LAB — TROPONIN I (HIGH SENSITIVITY)
Troponin I (High Sensitivity): 9 ng/L (ref <18)
Troponin I (High Sensitivity): 12 ng/L (ref <18)

## 2023-01-20 LAB — COMPREHENSIVE METABOLIC PANEL
ALT: 16 U/L (ref 0–44)
AST: 30 U/L (ref 15–41)
Albumin: 3.2 g/dL — ABNORMAL LOW (ref 3.5–5.0)
Alkaline Phosphatase: 98 U/L (ref 38–126)
Anion gap: 13 (ref 5–15)
BUN: 11 mg/dL (ref 6–20)
CO2: 28 mmol/L (ref 22–32)
Calcium: 8.7 mg/dL — ABNORMAL LOW (ref 8.9–10.3)
Chloride: 95 mmol/L — ABNORMAL LOW (ref 98–111)
Creatinine, Ser: 0.95 mg/dL (ref 0.44–1.00)
GFR, Estimated: >60 mL/min (ref >60)
Glucose, Bld: 95 mg/dL (ref 70–99)
Potassium: 2.7 mmol/L — CL (ref 3.5–5.1)
Sodium: 136 mmol/L (ref 135–145)
Total Bilirubin: 1.2 mg/dL (ref 0.3–1.2)
Total Protein: 6.5 g/dL (ref 6.5–8.1)

## 2023-01-20 LAB — ETHANOL: Alcohol, Ethyl (B): <10 mg/dL (ref <10)

## 2023-01-20 LAB — MAGNESIUM: Magnesium: 1.3 mg/dL — ABNORMAL LOW (ref 1.7–2.4)

## 2023-01-20 LAB — LIPASE, BLOOD: Lipase: 31 U/L (ref 11–51)

## 2023-01-20 MED ORDER — IOHEXOL 300 MG/ML  SOLN
100.0000 mL | Freq: Once | INTRAMUSCULAR | Status: AC | PRN
  Administered 2023-01-20: 100 mL via INTRAVENOUS

## 2023-01-20 MED ORDER — MAGNESIUM SULFATE 2 GM/50ML IV SOLN
2.0000 g | Freq: Once | INTRAVENOUS | Status: AC
  Administered 2023-01-20: 2 g via INTRAVENOUS
  Filled 2023-01-20: qty 50

## 2023-01-20 MED ORDER — METOCLOPRAMIDE HCL 5 MG/ML IJ SOLN
10.0000 mg | Freq: Once | INTRAMUSCULAR | Status: AC
  Administered 2023-01-20: 10 mg via INTRAVENOUS
  Filled 2023-01-20: qty 2

## 2023-01-20 MED ORDER — ENOXAPARIN SODIUM 40 MG/0.4ML IJ SOSY
40.0000 mg | PREFILLED_SYRINGE | Freq: Every day | INTRAMUSCULAR | Status: DC
  Administered 2023-01-20 – 2023-01-21 (×2): 40 mg via SUBCUTANEOUS
  Filled 2023-01-20 (×2): qty 0.4

## 2023-01-20 MED ORDER — CIPROFLOXACIN IN D5W 400 MG/200ML IV SOLN
400.0000 mg | Freq: Once | INTRAVENOUS | Status: AC
  Administered 2023-01-20: 400 mg via INTRAVENOUS
  Filled 2023-01-20: qty 200

## 2023-01-20 MED ORDER — PRAVASTATIN SODIUM 20 MG PO TABS
20.0000 mg | ORAL_TABLET | Freq: Every day | ORAL | Status: DC
  Administered 2023-01-20 – 2023-01-22 (×3): 20 mg via ORAL
  Filled 2023-01-20 (×3): qty 1

## 2023-01-20 MED ORDER — FOLIC ACID 1 MG PO TABS: 1.0000 mg | ORAL_TABLET | Freq: Every day | ORAL | Status: DC

## 2023-01-20 MED ORDER — PANTOPRAZOLE SODIUM 40 MG PO TBEC: 40.0000 mg | DELAYED_RELEASE_TABLET | Freq: Every day | ORAL | Status: DC

## 2023-01-20 MED ORDER — CIPROFLOXACIN IN D5W 400 MG/200ML IV SOLN: 400.0000 mg | Freq: Two times a day (BID) | INTRAVENOUS | Status: DC

## 2023-01-20 MED ORDER — METRONIDAZOLE 500 MG/100ML IV SOLN
500.0000 mg | Freq: Two times a day (BID) | INTRAVENOUS | Status: DC
  Administered 2023-01-21 (×2): 500 mg via INTRAVENOUS
  Filled 2023-01-20 (×3): qty 100

## 2023-01-20 MED ORDER — MELATONIN 5 MG PO TABS
5.0000 mg | ORAL_TABLET | Freq: Every evening | ORAL | Status: DC | PRN
  Administered 2023-01-20: 5 mg via ORAL
  Filled 2023-01-20: qty 1

## 2023-01-20 MED ORDER — POTASSIUM CHLORIDE 2 MEQ/ML IV SOLN
INTRAVENOUS | Status: AC
  Filled 2023-01-20 (×3): qty 1000

## 2023-01-20 MED ORDER — BUPRENORPHINE HCL 2 MG SL SUBL
4.0000 mg | SUBLINGUAL_TABLET | Freq: Every day | SUBLINGUAL | Status: DC
  Administered 2023-01-21 – 2023-01-22 (×2): 4 mg via SUBLINGUAL
  Filled 2023-01-20 (×2): qty 2

## 2023-01-20 MED ORDER — ASPIRIN 81 MG PO TBEC: 81.0000 mg | DELAYED_RELEASE_TABLET | Freq: Every day | ORAL | Status: DC

## 2023-01-20 MED ORDER — FAMOTIDINE IN NACL 20-0.9 MG/50ML-% IV SOLN
20.0000 mg | Freq: Once | INTRAVENOUS | Status: AC
  Administered 2023-01-20: 20 mg via INTRAVENOUS
  Filled 2023-01-20: qty 50

## 2023-01-20 MED ORDER — SODIUM CHLORIDE 0.9 % IV BOLUS
1000.0000 mL | Freq: Once | INTRAVENOUS | Status: AC
  Administered 2023-01-20: 1000 mL via INTRAVENOUS

## 2023-01-20 MED ORDER — METRONIDAZOLE 500 MG/100ML IV SOLN
500.0000 mg | Freq: Once | INTRAVENOUS | Status: AC
  Administered 2023-01-20: 500 mg via INTRAVENOUS
  Filled 2023-01-20: qty 100

## 2023-01-20 MED ORDER — PROCHLORPERAZINE EDISYLATE 10 MG/2ML IJ SOLN
5.0000 mg | Freq: Four times a day (QID) | INTRAMUSCULAR | Status: DC | PRN
  Administered 2023-01-21: 5 mg via INTRAVENOUS
  Filled 2023-01-20: qty 2

## 2023-01-20 MED ORDER — PANTOPRAZOLE SODIUM 40 MG PO TBEC
40.0000 mg | DELAYED_RELEASE_TABLET | Freq: Every day | ORAL | Status: DC
  Administered 2023-01-20 – 2023-01-22 (×3): 40 mg via ORAL
  Filled 2023-01-20 (×3): qty 1

## 2023-01-20 MED ORDER — ACETAMINOPHEN 325 MG PO TABS
650.0000 mg | ORAL_TABLET | Freq: Four times a day (QID) | ORAL | Status: DC | PRN
  Administered 2023-01-20 – 2023-01-21 (×2): 650 mg via ORAL
  Filled 2023-01-20 (×2): qty 2

## 2023-01-20 MED ORDER — IOHEXOL 300 MG/ML  SOLN: 100.0000 mL | Freq: Once | INTRAMUSCULAR | Status: DC | PRN

## 2023-01-20 MED ORDER — POTASSIUM CHLORIDE 10 MEQ/100ML IV SOLN
10.0000 meq | INTRAVENOUS | Status: AC
  Administered 2023-01-20 (×2): 10 meq via INTRAVENOUS
  Filled 2023-01-20 (×2): qty 100

## 2023-01-20 MED ORDER — POLYETHYLENE GLYCOL 3350 17 G PO PACK: 17.0000 g | PACK | Freq: Every day | ORAL | Status: DC | PRN

## 2023-01-20 MED ORDER — THIAMINE MONONITRATE 100 MG PO TABS: 100.0000 mg | ORAL_TABLET | Freq: Every day | ORAL | Status: DC

## 2023-01-20 NOTE — H&P (Addendum)
History and Physical  [name]:[id number] DOB: 03-02-1971 DOA: 01/20/2023  Referring physician: Dr. Renaye Rakers, EDP  [facility]P: [facility]  Outpatient Specialists: GI, neurology Patient coming from: Home  Chief Complaint: Persistent nausea and vomiting.  HPI: [name] is a 52 y.o. female with medical history significant for prior history of heroin use on chronic buprenorphine, hypertension, history of of alcohol use, who presents to [facility] ED from home due to intractable nausea and vomiting x 1 week.  She has been unable to keep any fluids down.  Associated with 2 episodes of loose stools in the past 5 days, recently noted cloudy urine with foul odor, and increase of urinary frequency.  Additionally endorses intermittent epigastric pain.  No melena, hematochezia, subjective fevers, or chills reported.  Gave Up heavy alcohol use.  States she had 1 alcoholic drink on her birthday 2 days ago on 01/18/2024, vomited as she could not tolerate it.  She presented to the ED via EMS for further evaluation.  In the ED, vital signs are stable.  Lab studies were remarkable for electrolyte disturbances.  UA positive for pyuria.  Denies recent use of illicit drugs.   CT abdomen and pelvis with contrast showed the following findings:  1. Borderline wall thickening in the proximal colon with faintly accentuated mucosal enhancement in the ascending colon potentially from low-grade colitis. 2. Fat deposition in the wall of the ascending and transverse colon, frequently incidental although this does have weak association with inflammatory bowel disease. 3. Pancreas divisum. 4. Mild wall thickening in the urinary bladder, correlate with urine analysis in assessing for cystitis. 5. Aortic atherosclerosis.  Aortic Atherosclerosis (ICD10-I70.0).  The patient was started on IV antibiotics empirically for low-grade proximal ascending colitis and urinary tract infection.  TRH, hospitalist service,  was asked to admit.   ED Course: Temperature 97.9.  BP 134/96, pulse 88, respiratory 19, O2 saturation 100% on room air.  Lab studies remarkable for serum potassium 2.7, magnesium 1.3, albumin 3.2.  High-sensitivity troponin 9, repeat 12.  CBC is unremarkable.  Review of Systems: Review of systems as noted in the HPI. All other systems reviewed and are negative.   Past Medical History:  Diagnosis Date   Alcohol dependence in sustained full remission    Benign essential hypertension 04/09/2021   Chronic pain syndrome 04/09/2021   Crohn's disease    Dilated cbd, acquired 04/09/2021   Gallstone pancreatitis 04/09/2021   Generalized anxiety disorder    Gout 04/09/2021   History of heroin use    Kidney stones    Major depressive disorder    Obstructive sleep apnea    Past Surgical History:  Procedure Laterality Date   LEEP     LITHOTRIPSY     TUBAL LIGATION      Social History:  reports that she has been smoking cigarettes. She has been smoking an average of 1 pack per day. She has never used smokeless tobacco. She reports current alcohol use of about 4.0 - 5.0 standard drinks of alcohol per week. She reports that she does not currently use drugs.   Allergies  Allergen Reactions   Penicillins Hives    Did it involve swelling of the face/tongue/throat, SOB, or low BP? No Did it involve sudden or severe rash/hives, skin peeling, or any reaction on the inside of your mouth or nose? Yes Did you need to seek medical attention at a hospital or doctor's office? Unknown When did it last happen?    childhood  If all above answers are "NO", may proceed with cephalosporin use.      Family History  Problem Relation Age of Onset   Thyroid disease Mother    Dementia Paternal Grandmother       Prior to Admission medications   Medication Sig Start Date End Date Taking? Authorizing Provider  amLODipine (NORVASC) 5 MG tablet Take 0.5 tablets (2.5 mg total) by mouth daily. 09/20/22    Alwyn Ren, MD  amLODipine (NORVASC) 5 MG tablet Take 0.5 tablets (2.5 mg total) by mouth daily. 12/09/22     aspirin EC 81 MG tablet Take 1 tablet (81 mg total) by mouth daily. Swallow whole. 09/19/22   Alwyn Ren, MD  Blood Pressure Monitoring (BLOOD PRESSURE KIT) KIT 1 (one) kit daily 07/13/22     buprenorphine (SUBUTEX) 8 MG SUBL SL tablet Place 0.5 tablets (4 mg total) under the tongue daily. 10/05/22     buprenorphine (SUBUTEX) 8 MG SUBL SL tablet Place 0.5 tablets (4 mg total) under the tongue 2 (two) times daily. 11/02/22     buprenorphine (SUBUTEX) 8 MG SUBL SL tablet Place 0.5 tablets (4 mg total) under the tongue 2 (two) times daily. 12/03/22     buprenorphine (SUBUTEX) 8 MG SUBL SL tablet Place 0.5 tablets (4 mg total) under the tongue 2 (two) times daily. 12/30/22     Cholecalciferol (D 10000) 250 MCG (10000 UT) CAPS Take 1 capsule by mouth daily 11/02/22     cholecalciferol (VITAMIN D3) 25 MCG (1000 UNIT) tablet Take 1,000 Units by mouth daily.    [provider]  esomeprazole (NEXIUM) 20 MG capsule Take 20 mg by mouth daily at 12 noon.    [provider]  folic acid (FOLVITE) 1 MG tablet Take 1 tablet (1 mg total) by mouth daily. 09/19/22   Alwyn Ren, MD  lisinopril-hydrochlorothiazide (ZESTORETIC) 20-25 MG tablet Take 1 tablet by mouth daily 11/02/22     nicotine (NICODERM CQ - DOSED IN MG/24 HOURS) 14 mg/24hr patch Place 1 patch (14 mg total) onto the skin daily. 09/19/22   Alwyn Ren, MD  ondansetron (ZOFRAN) 4 MG tablet Take 1 tablet (4 mg total) by mouth every 6 (six) hours as needed for nausea. 09/19/22   Alwyn Ren, MD  polyethylene glycol (MIRALAX / GLYCOLAX) 17 g packet Take 17 g by mouth daily. 09/19/22   Alwyn Ren, MD  pravastatin (PRAVACHOL) 20 MG tablet Take 1 tablet (20 mg total) by mouth daily. 12/09/22     senna-docusate (SENOKOT-S) 8.6-50 MG tablet Take 1 tablet by mouth daily. 09/19/22   Alwyn Ren, MD  thiamine (VITAMIN B-1) 100 MG tablet Take 1 tablet (100 mg total) by mouth daily. 09/19/22   Alwyn Ren, MD  traMADol (ULTRAM) 50 MG tablet Take 1-2 tablets (50-100 mg total) by mouth every 8 (eight) hours as needed. 10/05/22     traMADol (ULTRAM) 50 MG tablet Take 1-2 tablets (50-100 mg total) by mouth every 8 (eight) hours as needed. 12/30/22       Physical Exam: BP 123/86   Pulse 84   Temp 97.9 F (36.6 C) (Oral)   Resp 16   Ht 5\' 6"  (1.676 m)   Wt 59 kg   SpO2 100%   BMI 20.98 kg/m   General: 52 y.o. year-old female well developed well nourished in no acute distress.  Alert and oriented x3. Cardiovascular: Regular rate and rhythm with no rubs or gallops.  No thyromegaly or JVD noted.  No lower extremity edema. 2/4 pulses in all 4 extremities. Respiratory: Clear to auscultation with no wheezes or rales. Good inspiratory effort. Abdomen: Soft mild epigastric tenderness with palpation.  Normal bowel sounds x4 quadrants. Muskuloskeletal: No cyanosis, clubbing or edema noted bilaterally Neuro: CN II-XII intact, strength, sensation, reflexes Skin: No ulcerative lesions noted or rashes Psychiatry: Judgement and insight appear normal. Mood is appropriate for condition and setting          Labs on Admission:  Basic Metabolic Panel: Recent Labs  Lab 01/20/23 1531  NA 136  K 2.7*  CL 95*  CO2 28  GLUCOSE 95  BUN 11  CREATININE 0.95  CALCIUM 8.7*  MG 1.3*   Liver Function Tests: Recent Labs  Lab 01/20/23 1531  AST 30  ALT 16  ALKPHOS 98  BILITOT 1.2  PROT 6.5  ALBUMIN 3.2*   Recent Labs  Lab 01/20/23 1531  LIPASE 31   No results for input(s): "AMMONIA" in the last 168 hours. CBC: Recent Labs  Lab 01/20/23 1531  WBC 9.4  NEUTROABS 7.1  HGB 14.4  HCT 40.8  MCV 94.2  PLT 288   Cardiac Enzymes: No results for input(s): "CKTOTAL", "CKMB", "CKMBINDEX", "TROPONINI" in the last 168 hours.  BNP (last 3 results) No results for  input(s): "BNP" in the last 8760 hours.  ProBNP (last 3 results) No results for input(s): "PROBNP" in the last 8760 hours.  CBG: No results for input(s): "GLUCAP" in the last 168 hours.  Radiological Exams on Admission: CT ABDOMEN PELVIS W CONTRAST  Result Date: 01/20/2023 CLINICAL DATA:  Abdominal pain with nausea and vomiting. EXAM: CT ABDOMEN AND PELVIS WITH CONTRAST TECHNIQUE: Multidetector CT imaging of the abdomen and pelvis was performed using the standard protocol following bolus administration of intravenous contrast. RADIATION DOSE REDUCTION: This exam was performed according to the departmental dose-optimization program which includes automated exposure control, adjustment of the mA and/or kV according to patient size and/or use of iterative reconstruction technique. CONTRAST:  OMNIPAQUE IOHEXOL 300 MG/ML  SOLN COMPARISON:  08/24/2022 FINDINGS: Lower chest: Anterior bandlike scarring in the right lower lobe along with subsegmental atelectasis in both lower lobes. Mild lingular scarring. Descending thoracic aortic atherosclerosis. Hepatobiliary: Focal hepatic steatosis adjacent to the falciform ligament most notable in segment 4b. Gallbladder unremarkable. Common bile duct is mildly dilated at 0.9 cm, previously similar caliber on 08/24/2022. No significant degree of intrahepatic biliary dilatation. Pancreas: Pancreas divisum. Spleen: Unremarkable Adrenals/Urinary Tract: Scattered scarring in the right kidney. Cortical thinning in both kidneys although left greater than right. Calyceal diverticulum in the right kidney upper pole. No significant focal renal lesions. Borderline wall thickening in the urinary bladder diffusely, correlate with urine analysis in assessing for cystitis. Stomach/Bowel: Fat deposition in the wall of the ascending and transverse colon, frequently incidental although this does have weak association with inflammatory bowel disease. Postoperative findings along the  proximal colon. Terminal ileum unremarkable. Borderline wall thickening in the proximal colon with faintly accentuated mucosal enhancement in the ascending colon potentially from low-grade colitis. No surrounding mesenteric stranding is observed. Mild wall thickening in the sigmoid colon is primarily ascribable to nondistention. Vascular/Lymphatic: Mild aortoiliac atherosclerotic vascular calcification. Reproductive: Unremarkable Other: No supplemental non-categorized findings. Musculoskeletal: 0.8 by 0.6 cm sclerotic lesion in the left L2 vertebral body on image 70 of series 7, no substantial change from 04/09/2021, stability favors benign process. IMPRESSION: 1. Borderline wall thickening in the proximal colon with faintly accentuated mucosal  enhancement in the ascending colon potentially from low-grade colitis. 2. Fat deposition in the wall of the ascending and transverse colon, frequently incidental although this does have weak association with inflammatory bowel disease. 3. Pancreas divisum. 4. Mild wall thickening in the urinary bladder, correlate with urine analysis in assessing for cystitis. 5. Aortic atherosclerosis. Aortic Atherosclerosis (ICD10-I70.0). Electronically Signed   By: Gaylyn Rong M.D.   On: 01/20/2023 19:19   DG Chest 2 View  Result Date: 01/20/2023 CLINICAL DATA:  Chest pain EXAM: CHEST - 2 VIEW COMPARISON:  Chest x-ray April 09, 2021 FINDINGS: The cardiomediastinal silhouette is unchanged in contour. No focal pulmonary opacity. No pleural effusion or pneumothorax. The visualized upper abdomen is unremarkable. No acute osseous abnormality. IMPRESSION: No active cardiopulmonary disease. Electronically Signed   By: Jacob Moores M.D.   On: 01/20/2023 16:48    EKG: I independently viewed the EKG done and my findings are as followed: Sinus rhythm rate of 87.  Nonspecific ST-T changes.  QTc 503.  Assessment/Plan Present on Admission:  Intractable nausea and  vomiting  Principal Problem:   Intractable nausea and vomiting  Intractable nausea and vomiting, suspect multifactorial secondary to UTI, colitis. Supportive care IV antiemetics as needed IV fluid hydration LR KCl 40 mill equivalent at 75 cc/h x 1 day. Start with clear liquid diet and advance diet as tolerated  Low-grade proximal ascending colitis, seen on CT scan, POA Started on IV ciprofloxacin and IV Flagyl in the ED, continue. Monitor fever curve and WBC Continue gentle IV fluid hydration. Replace electrolytes as indicated.  Symptomatic UTI, POA Endorses dysuria, cloudy and foul smelling urine UA positive for pyuria Obtain urine culture Continue ciprofloxacin started in the ED  Moderate hypokalemia Presented with serum potassium 2.7 Repleted intravenously Continue LR KCl 40 mill equivalent at 75 cc/h x 1 day Replete magnesium level  Hypomagnesemia Presented with serum magnesium 1.3 Repleted intravenously Repeat magnesium level in the morning  Moderate protein calorie malnutrition Serum albumin 3.2 BMI 20 Moderate muscle mass loss Encourage oral intake as tolerated  QTc prolongation Admission twelve-lead EKG with QTc of 503 Avoid QTc prolonging agents Optimize magnesium and potassium levels Monitor on telemetry.  History of heroin use on buprenorphine Resume home regimen  Generalized weakness The patient states she has been too weak to take showers and requested shower bench PT to assess TOC consulted to assist with DME's  Hyperlipidemia Resume home pravastatin.  GERD Resume home Protonix.   DVT prophylaxis: Subcu Lovenox daily  Code Status: Full code  Family Communication: None at bedside.  Disposition Plan: Admitted to telemetry unit  Consults called: None.  Admission status: Observation status.   Status is: Observation    Darlin Drop MD [facility] Pager [phone]  If 7PM-7AM, please contact  night-coverage [email or web address] Password [facility]  01/20/2023, 7:40 PM

## 2023-01-20 NOTE — ED Provider Notes (Signed)
[place] EMERGENCY DEPARTMENT AT [facility] Provider Note   CSN: [id number] Arrival date & time: 01/20/23  1449     History  Chief Complaint  Patient presents with   Emesis    [name] is a 52 y.o. female presenting to the emergency department with nausea, vomiting, epigastric pain.  Patient reports has a history of "stomach ulcer".  She is not on medications.  She says her symptoms been ongoing for a week and she cannot keep down any food, water or medications.  She reports she has had some loose bowel movements.  She has burning epigastric pain and midsternal chest discomfort.  She reports she did attempt to have a drink of alcohol on her [birth year] 2 days ago but vomited afterwards.  She denies daily alcohol consumption reports that she is not having alcohol withdrawal, and that she "gave up heavy drinking".  She denies cannabis or marijuana use.  Denies any history of abdominal surgeries.  She separately reports cloudy urine and straining with urination.  HPI     Home Medications Prior to Admission medications   Medication Sig Start Date End Date Taking? Authorizing Provider  Aspirin-Salicylamide-Caffeine (BC HEADACHE POWDER PO) Take 1 packet by mouth daily as needed (for headaches).   Yes [provider]  buprenorphine (SUBUTEX) 8 MG SUBL SL tablet Place 0.5 tablets (4 mg total) under the tongue 2 (two) times daily. Patient taking differently: Place 4 mg under the tongue in the morning. 12/30/22  Yes   Cholecalciferol ([employer] VITAMIN D3) 250 MCG (10000 UT) CAPS Take 10,000 Units by mouth every other day.   Yes [provider]  Nicotine (NICODERM CQ TD) Place 1 patch onto the skin daily as needed (for smoking cessation while hospitalized).   Yes [provider]  ondansetron (ZOFRAN) 4 MG tablet Take 1 tablet (4 mg total) by mouth every 6 (six) hours as needed for nausea. 09/19/22  Yes Alwyn Ren, MD  pravastatin (PRAVACHOL) 20  MG tablet Take 1 tablet (20 mg total) by mouth daily. 12/09/22  Yes   SLOW FE 45 MG TBCR tablet Take 45 mg by mouth every other day.   Yes [provider]  traMADol (ULTRAM) 50 MG tablet Take 1-2 tablets (50-100 mg total) by mouth every 8 (eight) hours as needed. Patient taking differently: Take 100 mg by mouth every 8 (eight) hours as needed (for pain). 12/30/22  Yes   amLODipine (NORVASC) 5 MG tablet Take 0.5 tablets (2.5 mg total) by mouth daily. Patient not taking: Reported on 01/20/2023 09/20/22   Alwyn Ren, MD  amLODipine (NORVASC) 5 MG tablet Take 0.5 tablets (2.5 mg total) by mouth daily. Patient not taking: Reported on 01/20/2023 12/09/22     aspirin EC 81 MG tablet Take 1 tablet (81 mg total) by mouth daily. Swallow whole. Patient not taking: Reported on 01/20/2023 09/19/22   Alwyn Ren, MD  Blood Pressure Monitoring (BLOOD PRESSURE KIT) KIT 1 (one) kit daily 07/13/22     buprenorphine (SUBUTEX) 8 MG SUBL SL tablet Place 0.5 tablets (4 mg total) under the tongue daily. Patient not taking: Reported on 01/20/2023 10/05/22     buprenorphine (SUBUTEX) 8 MG SUBL SL tablet Place 0.5 tablets (4 mg total) under the tongue 2 (two) times daily. Patient not taking: Reported on 01/20/2023 11/02/22     buprenorphine (SUBUTEX) 8 MG SUBL SL tablet Place 0.5 tablets (4 mg total) under the tongue 2 (two) times daily. Patient not  taking: Reported on 01/20/2023 12/03/22     Cholecalciferol (D 10000) 250 MCG (10000 UT) CAPS Take 1 capsule by mouth daily Patient not taking: Reported on 01/20/2023 11/02/22     lisinopril-hydrochlorothiazide (ZESTORETIC) 20-25 MG tablet Take 1 tablet by mouth daily Patient not taking: Reported on 01/20/2023 11/02/22     nicotine (NICODERM CQ - DOSED IN MG/24 HOURS) 14 mg/24hr patch Place 1 patch (14 mg total) onto the skin daily. Patient not taking: Reported on 01/20/2023 09/19/22   Alwyn Ren, MD  polyethylene glycol (MIRALAX / GLYCOLAX) 17 g packet  Take 17 g by mouth daily. Patient not taking: Reported on 01/20/2023 09/19/22   Alwyn Ren, MD  senna-docusate (SENOKOT-S) 8.6-50 MG tablet Take 1 tablet by mouth daily. Patient not taking: Reported on 01/20/2023 09/19/22   Alwyn Ren, MD  thiamine (VITAMIN B-1) 100 MG tablet Take 1 tablet (100 mg total) by mouth daily. Patient not taking: Reported on 01/20/2023 09/19/22   Alwyn Ren, MD  traMADol (ULTRAM) 50 MG tablet Take 1-2 tablets (50-100 mg total) by mouth every 8 (eight) hours as needed. Patient not taking: Reported on 01/20/2023 10/05/22         Allergies    Penicillins    Review of Systems   Review of Systems  Physical Exam Updated Vital Signs BP (!) 153/91 (BP Location: Right Arm)   Pulse 78   Temp 98.9 F (37.2 C) (Oral)   Resp 19   Ht 5\' 6"  (1.676 m)   Wt 51.4 kg   SpO2 98%   BMI 18.29 kg/m  Physical Exam Constitutional:      General: She is not in acute distress. HENT:     Head: Normocephalic and atraumatic.  Eyes:     Conjunctiva/sclera: Conjunctivae normal.     Pupils: Pupils are equal, round, and reactive to light.  Cardiovascular:     Rate and Rhythm: Normal rate and regular rhythm.  Pulmonary:     Effort: Pulmonary effort is normal. No respiratory distress.  Abdominal:     General: There is no distension.     Tenderness: There is abdominal tenderness in the epigastric area. There is no guarding or rebound. Negative signs include Murphy's sign and McBurney's sign.  Skin:    General: Skin is warm and dry.  Neurological:     General: No focal deficit present.     Mental Status: She is alert. Mental status is at baseline.  Psychiatric:        Mood and Affect: Mood normal.        Behavior: Behavior normal.     ED Results / Procedures / Treatments   Labs (all labs ordered are listed, but only abnormal results are displayed) Labs Reviewed  COMPREHENSIVE METABOLIC PANEL - Abnormal; Notable for the following components:       Result Value   Potassium 2.7 (*)    Chloride 95 (*)    Calcium 8.7 (*)    Albumin 3.2 (*)    All other components within normal limits  MAGNESIUM - Abnormal; Notable for the following components:   Magnesium 1.3 (*)    All other components within normal limits  URINALYSIS, ROUTINE W REFLEX MICROSCOPIC - Abnormal; Notable for the following components:   Color, Urine AMBER (*)    APPearance CLOUDY (*)    Hgb urine dipstick SMALL (*)    Ketones, ur 5 (*)    Protein, ur 30 (*)    Nitrite POSITIVE (*)  Leukocytes,Ua LARGE (*)    Bacteria, UA MANY (*)    All other components within normal limits  URINE CULTURE  CBC WITH DIFFERENTIAL/PLATELET  LIPASE, BLOOD  ETHANOL  CBC  BASIC METABOLIC PANEL  MAGNESIUM  PHOSPHORUS  TROPONIN I (HIGH SENSITIVITY)  TROPONIN I (HIGH SENSITIVITY)    EKG EKG Interpretation  Date/Time:  Friday Jan 20 2023 15:03:01 EDT Ventricular Rate:  87 PR Interval:  150 QRS Duration: 88 QT Interval:  418 QTC Calculation: 503 R Axis:   59 Text Interpretation: Sinus rhythm Probable left atrial enlargement Anterior infarct, old Confirmed by Alvester Chou [phone]) on 01/20/2023 3:30:46 PM  Radiology CT ABDOMEN PELVIS W CONTRAST  Result Date: 01/20/2023 CLINICAL DATA:  Abdominal pain with nausea and vomiting. EXAM: CT ABDOMEN AND PELVIS WITH CONTRAST TECHNIQUE: Multidetector CT imaging of the abdomen and pelvis was performed using the standard protocol following bolus administration of intravenous contrast. RADIATION DOSE REDUCTION: This exam was performed according to the departmental dose-optimization program which includes automated exposure control, adjustment of the mA and/or kV according to patient size and/or use of iterative reconstruction technique. CONTRAST:  OMNIPAQUE IOHEXOL 300 MG/ML  SOLN COMPARISON:  08/24/2022 FINDINGS: Lower chest: Anterior bandlike scarring in the right lower lobe along with subsegmental atelectasis in both lower lobes. Mild  lingular scarring. Descending thoracic aortic atherosclerosis. Hepatobiliary: Focal hepatic steatosis adjacent to the falciform ligament most notable in segment 4b. Gallbladder unremarkable. Common bile duct is mildly dilated at 0.9 cm, previously similar caliber on 08/24/2022. No significant degree of intrahepatic biliary dilatation. Pancreas: Pancreas divisum. Spleen: Unremarkable Adrenals/Urinary Tract: Scattered scarring in the right kidney. Cortical thinning in both kidneys although left greater than right. Calyceal diverticulum in the right kidney upper pole. No significant focal renal lesions. Borderline wall thickening in the urinary bladder diffusely, correlate with urine analysis in assessing for cystitis. Stomach/Bowel: Fat deposition in the wall of the ascending and transverse colon, frequently incidental although this does have weak association with inflammatory bowel disease. Postoperative findings along the proximal colon. Terminal ileum unremarkable. Borderline wall thickening in the proximal colon with faintly accentuated mucosal enhancement in the ascending colon potentially from low-grade colitis. No surrounding mesenteric stranding is observed. Mild wall thickening in the sigmoid colon is primarily ascribable to nondistention. Vascular/Lymphatic: Mild aortoiliac atherosclerotic vascular calcification. Reproductive: Unremarkable Other: No supplemental non-categorized findings. Musculoskeletal: 0.8 by 0.6 cm sclerotic lesion in the left L2 vertebral body on image 70 of series 7, no substantial change from 04/09/2021, stability favors benign process. IMPRESSION: 1. Borderline wall thickening in the proximal colon with faintly accentuated mucosal enhancement in the ascending colon potentially from low-grade colitis. 2. Fat deposition in the wall of the ascending and transverse colon, frequently incidental although this does have weak association with inflammatory bowel disease. 3. Pancreas divisum.  4. Mild wall thickening in the urinary bladder, correlate with urine analysis in assessing for cystitis. 5. Aortic atherosclerosis. Aortic Atherosclerosis (ICD10-I70.0). Electronically Signed   By: Gaylyn Rong M.D.   On: 01/20/2023 19:19   DG Chest 2 View  Result Date: 01/20/2023 CLINICAL DATA:  Chest pain EXAM: CHEST - 2 VIEW COMPARISON:  Chest x-ray April 09, 2021 FINDINGS: The cardiomediastinal silhouette is unchanged in contour. No focal pulmonary opacity. No pleural effusion or pneumothorax. The visualized upper abdomen is unremarkable. No acute osseous abnormality. IMPRESSION: No active cardiopulmonary disease. Electronically Signed   By: Jacob Moores M.D.   On: 01/20/2023 16:48    Procedures .Critical Care  Performed by: Renaye Rakers,  Kermit Balo, MD Authorized by: Terald Sleeper, MD   Critical care provider statement:    Critical care time (minutes):  45   Critical care time was exclusive of:  Separately billable procedures and treating other patients   Critical care was necessary to treat or prevent imminent or life-threatening deterioration of the following conditions:  Metabolic crisis   Critical care was time spent personally by me on the following activities:  Ordering and performing treatments and interventions, ordering and review of laboratory studies, ordering and review of radiographic studies, pulse oximetry, review of old charts, examination of patient and evaluation of patient's response to treatment   Care discussed with: admitting provider   Comments:     Hypokalemia repletion     Medications Ordered in ED Medications  iohexol (OMNIPAQUE) 300 MG/ML solution 100 mL (has no administration in time range)  metroNIDAZOLE (FLAGYL) IVPB 500 mg (has no administration in time range)  lactated ringers 1,000 mL with potassium chloride 40 mEq infusion (has no administration in time range)  enoxaparin (LOVENOX) injection 40 mg (has no administration in time range)   acetaminophen (TYLENOL) tablet 650 mg (has no administration in time range)  prochlorperazine (COMPAZINE) injection 5 mg (has no administration in time range)  melatonin tablet 5 mg (has no administration in time range)  polyethylene glycol (MIRALAX / GLYCOLAX) packet 17 g (has no administration in time range)  buprenorphine (SUBUTEX) sublingual tablet 4 mg (has no administration in time range)  pravastatin (PRAVACHOL) tablet 20 mg (has no administration in time range)  ciprofloxacin (CIPRO) IVPB 400 mg (has no administration in time range)  metroNIDAZOLE (FLAGYL) IVPB 500 mg (has no administration in time range)  pantoprazole (PROTONIX) EC tablet 40 mg (has no administration in time range)  sodium chloride 0.9 % bolus 1,000 mL (0 mLs Intravenous Stopped 01/20/23 1746)  metoCLOPramide (REGLAN) injection 10 mg (10 mg Intravenous Given 01/20/23 1615)  famotidine (PEPCID) IVPB 20 mg premix (0 mg Intravenous Stopped 01/20/23 1747)  magnesium sulfate IVPB 2 g 50 mL (0 g Intravenous Stopped 01/20/23 1856)  potassium chloride 10 mEq in 100 mL IVPB (10 mEq Intravenous New Bag/Given 01/20/23 2003)  iohexol (OMNIPAQUE) 300 MG/ML solution 100 mL (100 mLs Intravenous Contrast Given 01/20/23 1823)  ciprofloxacin (CIPRO) IVPB 400 mg (400 mg Intravenous New Bag/Given 01/20/23 2004)    ED Course/ Medical Decision Making/ A&P Clinical Course as of 01/20/23 2111  Fri Jan 20, 2023  1925 CT imaging concerning for potential "low-grade colitis" as well as bladder wall thickening consistent with cystitis.  I have ordered the patient ciprofloxacin and Flagyl that we will cross coverage for likely UTI as well as colitis.  She is chronic common bile duct dilatation, without evidence of acute transaminitis, elevated bilirubin, do not suspect acute obstruction.  Anticipate medical admission at this time for colitis, persistent nausea and vomiting, hypokalemia, and UTI. [MT]  1937 Admitted to hospitalist [MT]    Clinical  Course User Index [MT] Melissaann Dizdarevic, Kermit Balo, MD                             Medical Decision Making Amount and/or Complexity of Data Reviewed Labs: ordered. Radiology: ordered.  Risk Prescription drug management. Decision regarding hospitalization.   This patient presents to the ED with concern for nausea, vomiting, epigastric discomfort. This involves an extensive number of treatment options, and is a complaint that carries with it a high risk of complications  and morbidity.  The differential diagnosis includes gastritis versus peptic ulcer versus cyclical vomiting versus biliary disease versus pancreatitis versus colitis versus other  Patient reports dysuria and foul-smelling urine, differential would include urinary tract infection.  Co-morbidities that complicate the patient evaluation: History of recurring alcohol consumption at high risk of alcoholic gastritis  External records from outside source obtained and reviewed including MR abdomen pelvis Aug 2022 showing biliary tree dilatation it is persistent which is felt to be potentially related to passed calculus or sphincter of Oddi dysfunction, mild hepatic steatosis, mild colonic edema thickening, cortical scarring of the right and left kidney.  I ordered and personally interpreted labs.  The pertinent results include: Hypokalemia, hypomagnesemia, UA with positive leukocytes and nitrites consistent with infection.  White blood cell count within normal limits.  Ethanol level and lipase within normal limits.  Troponin unremarkable  I ordered imaging studies including CT abdomen pelvis I independently visualized and interpreted imaging which showed bladder wall thickening consistent with cystitis, mild colitis findings, chronic, bile duct dilatation, chronic renal scarring I agree with the radiologist interpretation  The patient was maintained on a cardiac monitor.  I personally viewed and interpreted the cardiac monitored which showed  an underlying rhythm of: Sinus rhythm  Per my interpretation the patient's ECG shows no acute ischemic findings  I ordered medication including IV mag, IV potassium, IV nausea medication, IV fluids, IV antibiotics for cross coverage of UTI and potential infectious colitis  I have reviewed the patients home medicines and have made adjustments as needed  Test Considered: Doubt ovarian torsion, no indication for pelvic ultrasound at this time   After the interventions noted above, I reevaluated the patient and found that they have: stayed the same    Dispostion:  After consideration of the diagnostic results and the patients response to treatment, I feel that the patent would benefit from medical admission         Final Clinical Impression(s) / ED Diagnoses Final diagnoses:  Hypokalemia  Hypomagnesemia  Colitis  Cystitis  Nausea and vomiting, unspecified vomiting type    Rx / DC Orders ED Discharge Orders     None         Terald Sleeper, MD 01/20/23 2111

## 2023-01-20 NOTE — ED Triage Notes (Signed)
Patient BIB [facility] from home. A week ago began having vomiting, cloudy urine and increased urinary frequency. Complaining of right upper quadrant pain and vomiting.   EMS 18G Left AC normal saline

## 2023-01-21 DIAGNOSIS — N3090 Cystitis, unspecified without hematuria: Secondary | ICD-10-CM

## 2023-01-21 DIAGNOSIS — Z1623 Resistance to quinolones and fluoroquinolones: Secondary | ICD-10-CM | POA: Diagnosis present

## 2023-01-21 DIAGNOSIS — Z79899 Other long term (current) drug therapy: Secondary | ICD-10-CM | POA: Diagnosis not present

## 2023-01-21 DIAGNOSIS — K529 Noninfective gastroenteritis and colitis, unspecified: Secondary | ICD-10-CM

## 2023-01-21 DIAGNOSIS — Z880 Allergy status to penicillin: Secondary | ICD-10-CM | POA: Diagnosis not present

## 2023-01-21 DIAGNOSIS — K5090 Crohn's disease, unspecified, without complications: Secondary | ICD-10-CM | POA: Diagnosis present

## 2023-01-21 DIAGNOSIS — R1013 Epigastric pain: Secondary | ICD-10-CM | POA: Diagnosis present

## 2023-01-21 DIAGNOSIS — F1121 Opioid dependence, in remission: Secondary | ICD-10-CM | POA: Diagnosis present

## 2023-01-21 DIAGNOSIS — Z1611 Resistance to penicillins: Secondary | ICD-10-CM | POA: Diagnosis present

## 2023-01-21 DIAGNOSIS — N390 Urinary tract infection, site not specified: Secondary | ICD-10-CM | POA: Diagnosis present

## 2023-01-21 DIAGNOSIS — Z7982 Long term (current) use of aspirin: Secondary | ICD-10-CM | POA: Diagnosis not present

## 2023-01-21 DIAGNOSIS — E876 Hypokalemia: Secondary | ICD-10-CM | POA: Diagnosis present

## 2023-01-21 DIAGNOSIS — G4733 Obstructive sleep apnea (adult) (pediatric): Secondary | ICD-10-CM | POA: Diagnosis present

## 2023-01-21 DIAGNOSIS — Z820 Family history of epilepsy and other diseases of the nervous system: Secondary | ICD-10-CM | POA: Diagnosis not present

## 2023-01-21 DIAGNOSIS — Z8349 Family history of other endocrine, nutritional and metabolic diseases: Secondary | ICD-10-CM | POA: Diagnosis not present

## 2023-01-21 DIAGNOSIS — F17210 Nicotine dependence, cigarettes, uncomplicated: Secondary | ICD-10-CM | POA: Diagnosis present

## 2023-01-21 DIAGNOSIS — R112 Nausea with vomiting, unspecified: Secondary | ICD-10-CM | POA: Diagnosis not present

## 2023-01-21 DIAGNOSIS — G894 Chronic pain syndrome: Secondary | ICD-10-CM | POA: Diagnosis present

## 2023-01-21 DIAGNOSIS — E785 Hyperlipidemia, unspecified: Secondary | ICD-10-CM | POA: Diagnosis present

## 2023-01-21 DIAGNOSIS — Z681 Body mass index (BMI) 19 or less, adult: Secondary | ICD-10-CM | POA: Diagnosis not present

## 2023-01-21 DIAGNOSIS — Z1629 Resistance to other single specified antibiotic: Secondary | ICD-10-CM | POA: Diagnosis present

## 2023-01-21 DIAGNOSIS — I10 Essential (primary) hypertension: Secondary | ICD-10-CM | POA: Diagnosis present

## 2023-01-21 DIAGNOSIS — K219 Gastro-esophageal reflux disease without esophagitis: Secondary | ICD-10-CM | POA: Diagnosis present

## 2023-01-21 DIAGNOSIS — R9431 Abnormal electrocardiogram [ECG] [EKG]: Secondary | ICD-10-CM | POA: Diagnosis present

## 2023-01-21 DIAGNOSIS — B9620 Unspecified Escherichia coli [E. coli] as the cause of diseases classified elsewhere: Secondary | ICD-10-CM | POA: Diagnosis present

## 2023-01-21 DIAGNOSIS — E440 Moderate protein-calorie malnutrition: Secondary | ICD-10-CM | POA: Diagnosis present

## 2023-01-21 LAB — BASIC METABOLIC PANEL
Anion gap: 9 (ref 5–15)
BUN: 9 mg/dL (ref 6–20)
CO2: 25 mmol/L (ref 22–32)
Calcium: 8.1 mg/dL — ABNORMAL LOW (ref 8.9–10.3)
Chloride: 100 mmol/L (ref 98–111)
Creatinine, Ser: 0.92 mg/dL (ref 0.44–1.00)
GFR, Estimated: >60 mL/min (ref >60)
Glucose, Bld: 87 mg/dL (ref 70–99)
Potassium: 3 mmol/L — ABNORMAL LOW (ref 3.5–5.1)
Sodium: 134 mmol/L — ABNORMAL LOW (ref 135–145)

## 2023-01-21 LAB — CBC
HCT: 35.3 % — ABNORMAL LOW (ref 36.0–46.0)
Hemoglobin: 12.2 g/dL (ref 12.0–15.0)
MCH: 32.9 pg (ref 26.0–34.0)
MCHC: 34.6 g/dL (ref 30.0–36.0)
MCV: 95.1 fL (ref 80.0–100.0)
Platelets: 251 10*3/uL (ref 150–400)
RBC: 3.71 MIL/uL — ABNORMAL LOW (ref 3.87–5.11)
RDW: 15.3 % (ref 11.5–15.5)
WBC: 6 10*3/uL (ref 4.0–10.5)
nRBC: 0 % (ref 0.0–0.2)

## 2023-01-21 LAB — URINE CULTURE

## 2023-01-21 LAB — PHOSPHORUS: Phosphorus: 2.8 mg/dL (ref 2.5–4.6)

## 2023-01-21 LAB — MAGNESIUM: Magnesium: 1.7 mg/dL (ref 1.7–2.4)

## 2023-01-21 MED ORDER — KETOROLAC TROMETHAMINE 30 MG/ML IJ SOLN
30.0000 mg | Freq: Once | INTRAMUSCULAR | Status: AC
  Administered 2023-01-21: 30 mg via INTRAVENOUS
  Filled 2023-01-21: qty 1

## 2023-01-21 MED ORDER — SODIUM CHLORIDE 0.9 % IV SOLN
2.0000 g | INTRAVENOUS | Status: DC
  Administered 2023-01-21: 2 g via INTRAVENOUS
  Filled 2023-01-21 (×2): qty 20

## 2023-01-21 MED ORDER — MAGNESIUM OXIDE -MG SUPPLEMENT 400 (240 MG) MG PO TABS
400.0000 mg | ORAL_TABLET | Freq: Two times a day (BID) | ORAL | Status: AC
  Administered 2023-01-21 (×2): 400 mg via ORAL
  Filled 2023-01-21 (×2): qty 1

## 2023-01-21 MED ORDER — POTASSIUM CHLORIDE 20 MEQ PO PACK
40.0000 meq | PACK | Freq: Two times a day (BID) | ORAL | Status: AC
  Administered 2023-01-21 (×2): 40 meq via ORAL
  Filled 2023-01-21 (×2): qty 2

## 2023-01-21 MED ORDER — CAPSAICIN 0.075 % EX CREA
TOPICAL_CREAM | Freq: Two times a day (BID) | CUTANEOUS | Status: DC
  Filled 2023-01-21: qty 57

## 2023-01-21 NOTE — Progress Notes (Signed)
PT Cancellation Note  Patient Details Name: [name] MRN: [id number] DOB: [birth year]   Cancelled Treatment:    Reason Eval/Treat Not Completed: Patient at procedure or test/unavailable. Attempted to see patient. Nurse reports she just threw up.  Now in bathroom with diarrhea.  Will check back as schedule permits.   Enzo Montgomery 01/21/2023, 12:57 PM

## 2023-01-21 NOTE — Progress Notes (Signed)
[facility] PROGRESS NOTE    Progress Note  CAROLIN RUSHER  ZOX:[id number] DOB: July 13, 1971 DOA: 01/20/2023 PCP: [facility]     Brief Narrative:   MARKEESHA URBAEZ is an 52 y.o. female past medical history significant prior use of heroin on chronic buprenorphine, essential hypertension alcohol abuse comes into the [facility] for intractable nausea and vomiting for 1 week unable to keep fluids down associated with diarrhea, she noticed foul odor urine CT scan of the abdomen pelvis showed possible low-grade colitis and thickening wall bladder.   Assessment/Plan:   Intractable nausea and vomiting possibly due to UTI: Currently on antiemetics has been fluid resuscitated. She was started on clear liquid diet. Started empirically on Cipro and Flagyl for possible colitis. She has remained afebrile with no leukocytosis. Urine cultures were sent.  Hypokalemia: Repleted IV with minimal improvement now she is taking a clear liquid diet repeat with orals. Recheck basic metabolic panel in the morning.  Hypomagnesemia Try to keep magnesium greater than 2 replete orally.  Moderate protein caloric malnutrition: Ensure 3 times daily.  Prolonged QTc: Likely due to hypokalemia and hypomagnesemia check potassium greater than 4 magnesium greater than 2.  History of heroin use: Continue buprenorphine.  Generalized weakness: Consult PT OT.  Hyperlipidemia: Continue statins.   DVT prophylaxis: lovenox Family Communication:none Status is: Observation The patient remains OBS appropriate and will d/c before 2 midnights.    Code Status:     Code Status Orders  (From admission, onward)           Start     Ordered   01/20/23 1956  Full code  Continuous       Question:  By:  Answer:  Consent: discussion documented in EHR   01/20/23 1955           Code Status History     Date Active Date Inactive Code Status Order ID Comments User Context   09/12/2022  2324 09/19/2022 2337 Full Code [id number]  Carollee Herter, DO ED   04/09/2021 2015 04/14/2021 2316 Full Code [id number]  Standley Brooking, MD Inpatient         IV Access:   Peripheral IV   Procedures and diagnostic studies:   CT ABDOMEN PELVIS W CONTRAST  Result Date: 01/20/2023 CLINICAL DATA:  Abdominal pain with nausea and vomiting. EXAM: CT ABDOMEN AND PELVIS WITH CONTRAST TECHNIQUE: Multidetector CT imaging of the abdomen and pelvis was performed using the standard protocol following bolus administration of intravenous contrast. RADIATION DOSE REDUCTION: This exam was performed according to the departmental dose-optimization program which includes automated exposure control, adjustment of the mA and/or kV according to patient size and/or use of iterative reconstruction technique. CONTRAST:  OMNIPAQUE IOHEXOL 300 MG/ML  SOLN COMPARISON:  08/24/2022 FINDINGS: Lower chest: Anterior bandlike scarring in the right lower lobe along with subsegmental atelectasis in both lower lobes. Mild lingular scarring. Descending thoracic aortic atherosclerosis. Hepatobiliary: Focal hepatic steatosis adjacent to the falciform ligament most notable in segment 4b. Gallbladder unremarkable. Common bile duct is mildly dilated at 0.9 cm, previously similar caliber on 08/24/2022. No significant degree of intrahepatic biliary dilatation. Pancreas: Pancreas divisum. Spleen: Unremarkable Adrenals/Urinary Tract: Scattered scarring in the right kidney. Cortical thinning in both kidneys although left greater than right. Calyceal diverticulum in the right kidney upper pole. No significant focal renal lesions. Borderline wall thickening in the urinary bladder diffusely, correlate with urine analysis in assessing for cystitis. Stomach/Bowel: Fat deposition in the wall of the  ascending and transverse colon, frequently incidental although this does have weak association with inflammatory bowel disease. Postoperative findings along  the proximal colon. Terminal ileum unremarkable. Borderline wall thickening in the proximal colon with faintly accentuated mucosal enhancement in the ascending colon potentially from low-grade colitis. No surrounding mesenteric stranding is observed. Mild wall thickening in the sigmoid colon is primarily ascribable to nondistention. Vascular/Lymphatic: Mild aortoiliac atherosclerotic vascular calcification. Reproductive: Unremarkable Other: No supplemental non-categorized findings. Musculoskeletal: 0.8 by 0.6 cm sclerotic lesion in the left L2 vertebral body on image 70 of series 7, no substantial change from 04/09/2021, stability favors benign process. IMPRESSION: 1. Borderline wall thickening in the proximal colon with faintly accentuated mucosal enhancement in the ascending colon potentially from low-grade colitis. 2. Fat deposition in the wall of the ascending and transverse colon, frequently incidental although this does have weak association with inflammatory bowel disease. 3. Pancreas divisum. 4. Mild wall thickening in the urinary bladder, correlate with urine analysis in assessing for cystitis. 5. Aortic atherosclerosis. Aortic Atherosclerosis (ICD10-I70.0). Electronically Signed   By: Gaylyn Rong M.D.   On: 01/20/2023 19:19   DG Chest 2 View  Result Date: 01/20/2023 CLINICAL DATA:  Chest pain EXAM: CHEST - 2 VIEW COMPARISON:  Chest x-ray April 09, 2021 FINDINGS: The cardiomediastinal silhouette is unchanged in contour. No focal pulmonary opacity. No pleural effusion or pneumothorax. The visualized upper abdomen is unremarkable. No acute osseous abnormality. IMPRESSION: No active cardiopulmonary disease. Electronically Signed   By: Jacob Moores M.D.   On: 01/20/2023 16:48     Medical Consultants:   None.   Subjective:    Mersadies D Mangels abdominal pain epigastric is better she continues to have dysuria  Objective:    Vitals:   01/20/23 2005 01/20/23 2104 01/21/23 0145  01/21/23 0517  BP:  (!) 153/91 124/82 129/84  Pulse:  78 78 77  Resp:  19 20 18   Temp: 98.5 F (36.9 C) 98.9 F (37.2 C) 98.2 F (36.8 C) 98.1 F (36.7 C)  TempSrc: Oral Oral Oral Oral  SpO2:  98% 97% 97%  Weight:  51.4 kg    Height:  5\' 6"  (1.676 m)     SpO2: 97 %   Intake/Output Summary (Last 24 hours) at 01/21/2023 0730 Last data filed at 01/21/2023 0600 Gross per 24 hour  Intake 1738.82 ml  Output --  Net 1738.82 ml   Filed Weights   01/20/23 1459 01/20/23 2104  Weight: 59 kg 51.4 kg    Exam: General exam: In no acute distress. Respiratory system: Good air movement and clear to auscultation. Cardiovascular system: S1 & S2 heard, RRR. No JVD. Gastrointestinal system: Abdomen is nondistended, soft and nontender.  Extremities: No pedal edema. Skin: No rashes, lesions or ulcers Psychiatry: Judgement and insight appear normal. Mood & affect appropriate.    Data Reviewed:    Labs: Basic Metabolic Panel: Recent Labs  Lab 01/20/23 1531 01/21/23 0552  NA 136 134*  K 2.7* 3.0*  CL 95* 100  CO2 28 25  GLUCOSE 95 87  BUN 11 9  CREATININE 0.95 0.92  CALCIUM 8.7* 8.1*  MG 1.3* 1.7  PHOS  --  2.8   GFR Estimated Creatinine Clearance: 57.4 mL/min (by C-G formula based on SCr of 0.92 mg/dL). Liver Function Tests: Recent Labs  Lab 01/20/23 1531  AST 30  ALT 16  ALKPHOS 98  BILITOT 1.2  PROT 6.5  ALBUMIN 3.2*   Recent Labs  Lab 01/20/23 1531  LIPASE 31  No results for input(s): "AMMONIA" in the last 168 hours. Coagulation profile No results for input(s): "INR", "PROTIME" in the last 168 hours. COVID-19 Labs  No results for input(s): "DDIMER", "FERRITIN", "LDH", "CRP" in the last 72 hours.  Lab Results  Component Value Date   SARSCOV2NAA NEGATIVE 08/17/2022   SARSCOV2NAA NEGATIVE 04/09/2021    CBC: Recent Labs  Lab 01/20/23 1531 01/21/23 0552  WBC 9.4 6.0  NEUTROABS 7.1  --   HGB 14.4 12.2  HCT 40.8 35.3*  MCV 94.2 95.1  PLT 288 251    Cardiac Enzymes: No results for input(s): "CKTOTAL", "CKMB", "CKMBINDEX", "TROPONINI" in the last 168 hours. BNP (last 3 results) No results for input(s): "PROBNP" in the last 8760 hours. CBG: No results for input(s): "GLUCAP" in the last 168 hours. D-Dimer: No results for input(s): "DDIMER" in the last 72 hours. Hgb A1c: No results for input(s): "HGBA1C" in the last 72 hours. Lipid Profile: No results for input(s): "CHOL", "HDL", "LDLCALC", "TRIG", "CHOLHDL", "LDLDIRECT" in the last 72 hours. Thyroid function studies: No results for input(s): "TSH", "T4TOTAL", "T3FREE", "THYROIDAB" in the last 72 hours.  Invalid input(s): "FREET3" Anemia work up: No results for input(s): "VITAMINB12", "FOLATE", "FERRITIN", "TIBC", "IRON", "RETICCTPCT" in the last 72 hours. Sepsis Labs: Recent Labs  Lab 01/20/23 1531 01/21/23 0552  WBC 9.4 6.0   Microbiology No results found for this or any previous visit (from the past 240 hour(s)).   Medications:    buprenorphine  4 mg Sublingual Daily   enoxaparin (LOVENOX) injection  40 mg Subcutaneous QHS   pantoprazole  40 mg Oral Daily   pravastatin  20 mg Oral Daily   Continuous Infusions:  ciprofloxacin     lactated ringers 1,000 mL with potassium chloride 40 mEq infusion 75 mL/hr at 01/20/23 2127   metronidazole        LOS: 0 days   Marinda Elk  [facility]  01/21/2023, 7:30 AM

## 2023-01-21 NOTE — Evaluation (Signed)
Occupational Therapy Evaluation Patient Details Name: [name] MRN: [id number] DOB: 07-19-1971 Today's Date: 01/21/2023   History of Present Illness ([name] is a 52 y.o. female with PMH of HTN and ETOH abuse, admitted w/ intractable nausea & vomiting for 1 wk with diarrhea. Imaging revealed possible low-grade colitis and thickening wall bladder.)   Clinical Impression   The pt performed all assessed tasks with supervision or better, including sit to stand, toileting at bathroom level, and lower body dressing. She appears to be near to her baseline for self care management and she does not require further OT services. She did express concerns about bathing at home, given decreased standing tolerance secondary to chronic BLE pain and fatigue; as such, OT educated her on options for use of a tub seat vs. tub bench to assist  with safety and energy conservation during bathing tasks. OT anticipates she would benefit from use of a tub seat/bench. No further OT needs identified. OT will sign off.      Recommendations for follow up therapy are one component of a multi-disciplinary discharge planning process, led by the attending physician.  Recommendations may be updated based on patient status, additional functional criteria and insurance authorization.   Assistance Recommended at Discharge PRN  Patient can return home with the following Assistance with cooking/housework;Assist for transportation;Help with stairs or ramp for entrance    Functional Status Assessment  Patient has not had a recent decline in their functional status  Equipment Recommendations  Tub/shower seat       Precautions / Restrictions Precautions Precautions: Fall Restrictions Weight Bearing Restrictions: No      Mobility Bed Mobility Overal bed mobility: Independent         Transfers Overall transfer level: Needs assistance Equipment used: Rolling walker (2 wheels) Transfers: Sit to/from Stand Sit  to Stand: Supervision             Balance Overall balance assessment: Mild deficits observed, not formally tested                ADL either performed or assessed with clinical judgement   ADL Overall ADL's : At baseline                   Vision Patient Visual Report: No change from baseline              Pertinent Vitals/Pain Pain Assessment Pain Assessment: 0-10 Pain Score: 6  Pain Location: chronic BLE pain Pain Intervention(s): Limited activity within patient's tolerance, Monitored during session, Other (comment) (She reported receiving Tylenol.)        Extremity/Trunk Assessment Upper Extremity Assessment Upper Extremity Assessment: Overall WFL for tasks assessed   Lower Extremity Assessment Lower Extremity Assessment: Overall WFL for tasks assessed      Communication Communication Communication: No difficulties   Cognition Arousal/Alertness: Awake/alert Behavior During Therapy: WFL for tasks assessed/performed Overall Cognitive Status: Within Functional Limits for tasks assessed                General Comments: Oriented x4                Home Living Family/patient expects to be discharged to:: Private residence Living Arrangements: Spouse/significant other   Type of Home: House Home Access: Stairs to enter [employer] of Steps: 4-5   Home Layout: One level     Bathroom Shower/Tub: Chief Strategy Officer: Standard     Home Equipment: Rollator (4 wheels);Cane - single  point   Additional Comments      Prior Functioning/Environment Prior Level of Function : Independent/Modified Independent             Mobility Comments: Will normally walk with cane and furniture cruise, unless she feels bad and will use the rollator.  In community she uses rollator. ADLs Comments: She was modified independent to independent with ADLs & her spouse managed most household chores.  She has been spongebathing recently  due to limited standing tolerance when bathing.        OT Problem List: Pain      OT Treatment/Interventions:  (N/A)       OT Frequency:  (N/A)       AM-PAC OT "6 Clicks" Daily Activity     Outcome Measure Help from another person eating meals?: None Help from another person taking care of personal grooming?: None Help from another person toileting, which includes using toliet, bedpan, or urinal?: None Help from another person bathing (including washing, rinsing, drying)?: A Little Help from another person to put on and taking off regular upper body clothing?: None Help from another person to put on and taking off regular lower body clothing?: None 6 Click Score: 23   End of Session Equipment Utilized During Treatment: Rolling walker (2 wheels) Nurse Communication: Mobility status  Activity Tolerance: Patient tolerated treatment well Patient left: in bed;with call bell/phone within reach;with bed alarm set  OT Visit Diagnosis: Muscle weakness (generalized) (M62.81)                Time: 1610-9604 OT Time Calculation (min): 15 min Charges:  OT General Charges $OT Visit: 1 Visit OT Evaluation $OT Eval Low Complexity: 1 Low    Makisha Marrin L Kammi Hechler, OTR/L 01/21/2023, 5:04 PM

## 2023-01-21 NOTE — Evaluation (Signed)
Physical Therapy Evaluation Patient Details Name: [name] MRN: [id number] DOB: [birth year] Today's Date: 01/21/2023  History of Present Illness  [name] is an 52 y.o. female past medical history significant prior use of heroin on chronic buprenorphine, essential hypertension alcohol abuse comes into the [facility] ED for intractable nausea and vomiting for 1 week unable to keep fluids down associated with diarrhea, she noticed foul odor urine CT scan of the abdomen pelvis showed possible low-grade colitis and thickening wall bladder.  Clinical Impression  Pt admitted with above diagnosis.  Pt currently with functional limitations due to the deficits listed below (see PT Problem List). Pt will benefit from acute skilled PT to increase their independence and safety with mobility to allow discharge.  Pt's biggest complaint is that she is having trouble getting into her shower to get clean at home and even once in, having difficulty bathing. OT order now in place.  She was heavily reliant on RW and feel she would do better with this at home for her leg pain than the rollator.  She did feel like it gave her better support. Recommend RW for home use.  Will follow acutely, but do not feel she needs PT at time of d/c.        Recommendations for follow up therapy are one component of a multi-disciplinary discharge planning process, led by the attending physician.  Recommendations may be updated based on patient status, additional functional criteria and insurance authorization.  Follow Up Recommendations       Assistance Recommended at Discharge Set up Supervision/Assistance  Patient can return home with the following  Assist for transportation;A little help with walking and/or transfers;A little help with bathing/dressing/bathroom    Equipment Recommendations Rolling walker (2 wheels);Other (comment) (tub transfer bench)  Recommendations for Other Services  OT consult    Functional  Status Assessment Patient has had a recent decline in their functional status and demonstrates the ability to make significant improvements in function in a reasonable and predictable amount of time.     Precautions / Restrictions Precautions Precautions: Fall Restrictions Weight Bearing Restrictions: No      Mobility  Bed Mobility Overal bed mobility: Modified Independent                  Transfers Overall transfer level: Needs assistance Equipment used: Rolling walker (2 wheels) Transfers: Sit to/from Stand Sit to Stand: Min guard           General transfer comment: cues for hand position    Ambulation/Gait Ambulation/Gait assistance: Min guard Gait Distance (Feet): 100 Feet Assistive device: Rolling walker (2 wheels) Gait Pattern/deviations: Decreased step length - right, Decreased step length - left       General Gait Details: heavy reliance on RW and noted fatigue in legs, but no LOB  Stairs            Wheelchair Mobility    Modified Rankin (Stroke Patients Only)       Balance Overall balance assessment: Mild deficits observed, not formally tested                                           Pertinent Vitals/Pain Pain Assessment Pain Assessment: 0-10 Pain Score: 5  Pain Location: legs while walking Pain Descriptors / Indicators: Discomfort Pain Intervention(s): Limited activity within patient's tolerance, Monitored during session  Home Living Family/patient expects to be discharged to:: Private residence Living Arrangements: Spouse/significant other   Type of Home: House Home Access: Stairs to enter   [employer] of Steps: 4-5   Home Layout: One level Home Equipment: Rollator (4 wheels);Cane - single point;Grab bars - tub/shower Additional Comments: "suction cup" grab bar    Prior Function Prior Level of Function : Independent/Modified Independent             Mobility Comments: Will normally  walk with cane and furniture cruise, unless she feels bad and will use the rollator.  In community she uses rollator.       Hand Dominance        Extremity/Trunk Assessment   Upper Extremity Assessment Upper Extremity Assessment: Defer to OT evaluation    Lower Extremity Assessment Lower Extremity Assessment: Generalized weakness    Cervical / Trunk Assessment Cervical / Trunk Assessment: Normal  Communication      Cognition Arousal/Alertness: Awake/alert Behavior During Therapy: WFL for tasks assessed/performed Overall Cognitive Status: Within Functional Limits for tasks assessed                                          General Comments      Exercises     Assessment/Plan    PT Assessment Patient needs continued PT services  PT Problem List Decreased strength;Decreased activity tolerance;Decreased balance;Decreased mobility       PT Treatment Interventions Gait training;Functional mobility training;Therapeutic activities;Therapeutic exercise;Balance training    PT Goals (Current goals can be found in the Care Plan section)  Acute Rehab PT Goals Patient Stated Goal: Make bathing easier PT Goal Formulation: With patient Time For Goal Achievement: 02/04/23 Potential to Achieve Goals: Good    Frequency Min 1X/week     Co-evaluation               AM-PAC PT "6 Clicks" Mobility  Outcome Measure Help needed turning from your back to your side while in a flat bed without using bedrails?: None Help needed moving from lying on your back to sitting on the side of a flat bed without using bedrails?: None Help needed moving to and from a bed to a chair (including a wheelchair)?: A Little Help needed standing up from a chair using your arms (e.g., wheelchair or bedside chair)?: A Little Help needed to walk in hospital room?: A Little Help needed climbing 3-5 steps with a railing? : A Lot 6 Click Score: 19    End of Session Equipment Utilized  During Treatment: Gait belt Activity Tolerance: Patient limited by fatigue Patient left: in bed;with bed alarm set;with call bell/phone within reach Nurse Communication: Mobility status PT Visit Diagnosis: Difficulty in walking, not elsewhere classified (R26.2)    Time: 1440-1456 PT Time Calculation (min) (ACUTE ONLY): 16 min   Charges:   PT Evaluation $PT Eval Moderate Complexity: 1 Mod          Colin Broach., PT Office [phone] Acute Rehab 01/21/2023   Enzo Montgomery 01/21/2023, 3:43 PM

## 2023-01-22 DIAGNOSIS — K529 Noninfective gastroenteritis and colitis, unspecified: Secondary | ICD-10-CM | POA: Diagnosis not present

## 2023-01-22 DIAGNOSIS — R112 Nausea with vomiting, unspecified: Secondary | ICD-10-CM | POA: Diagnosis not present

## 2023-01-22 DIAGNOSIS — N3090 Cystitis, unspecified without hematuria: Secondary | ICD-10-CM | POA: Diagnosis not present

## 2023-01-22 DIAGNOSIS — E876 Hypokalemia: Secondary | ICD-10-CM | POA: Diagnosis not present

## 2023-01-22 LAB — BASIC METABOLIC PANEL
Anion gap: 4 — ABNORMAL LOW (ref 5–15)
BUN: 8 mg/dL (ref 6–20)
CO2: 23 mmol/L (ref 22–32)
Calcium: 8.4 mg/dL — ABNORMAL LOW (ref 8.9–10.3)
Chloride: 109 mmol/L (ref 98–111)
Creatinine, Ser: 1.01 mg/dL — ABNORMAL HIGH (ref 0.44–1.00)
GFR, Estimated: >60 mL/min (ref >60)
Glucose, Bld: 91 mg/dL (ref 70–99)
Potassium: 4.9 mmol/L (ref 3.5–5.1)
Sodium: 136 mmol/L (ref 135–145)

## 2023-01-22 LAB — URINE CULTURE: Culture: >=100000 — AB

## 2023-01-22 MED ORDER — METRONIDAZOLE 500 MG PO TABS
500.0000 mg | ORAL_TABLET | Freq: Three times a day (TID) | ORAL | 0 refills | Status: AC
  Filled 2023-01-22: qty 15, 5d supply, fill #0

## 2023-01-22 MED ORDER — CIPROFLOXACIN HCL 500 MG PO TABS
500.0000 mg | ORAL_TABLET | Freq: Two times a day (BID) | ORAL | 0 refills | Status: DC
  Filled 2023-01-22: qty 10, 5d supply, fill #0

## 2023-01-22 MED ORDER — CEPHALEXIN 500 MG PO CAPS
500.0000 mg | ORAL_CAPSULE | Freq: Four times a day (QID) | ORAL | 0 refills | Status: DC
  Filled 2023-01-22: qty 20, 5d supply, fill #0

## 2023-01-22 NOTE — Discharge Summary (Addendum)
Physician Discharge Summary  [name]:[id number] DOB: 03/29/1971 DOA: 01/20/2023  PCP: [facility]  Admit date: 01/20/2023 Discharge date: 01/22/2023  Admitted From: Home Disposition:  Home  Recommendations for Outpatient Follow-up:  Follow up with PCP in 1-2 weeks Please obtain BMP/CBC in one week  Home Health:No Equipment/Devices:None  Discharge Condition:Stable CODE STATUS:Full  Diet recommendation: Heart Healthy   Brief/Interim Summary: 52 y.o. female past medical history significant prior use of heroin on chronic buprenorphine, essential hypertension alcohol abuse comes into the [place] long ED for intractable nausea and vomiting for 1 week unable to keep fluids down associated with diarrhea, she noticed foul odor urine CT scan of the abdomen pelvis showed possible low-grade colitis and thickening wall bladder.   Discharge Diagnoses:  Principal Problem:   Intractable nausea and vomiting Active Problems:   Benign essential hypertension   Malnutrition of moderate degree   Hypomagnesemia   UTI (urinary tract infection)  Intractable nausea and vomiting possibly due to UTI/infectious colitis: She was started on antibiotics IV fluids. Started on IV Rocephin and Flagyl. CT scan showed possible colitis she was started on Rocephin and Flagyl her diarrhea resolved abdominal pain improved. Urine culture grew E. coli she was changed to oral Cipro which she will continue as an outpatient. Continue antibiotics as an outpatient.  Hypokalemia: Replete orally now resolved.  Hypomagnesemia: Repleted orally now resolved  Prolonged QTc: Likely due to hypokalemia and hypomagnesemia were repleted.  History of heroin use: Continue Suboxone.  Hyperlipidemia: Continue statins.  Discharge Instructions  Discharge Instructions     Diet - low sodium heart healthy   Complete by: As directed    Increase activity slowly   Complete by: As directed        Allergies as of 01/22/2023       Reactions   Penicillins Hives   Did it involve swelling of the face/tongue/throat, SOB, or low BP? No Did it involve sudden or severe rash/hives, skin peeling, or any reaction on the inside of your mouth or nose? Yes Did you need to seek medical attention at a hospital or doctor's office? Unknown When did it last happen?    childhood   If all above answers are "NO", may proceed with cephalosporin use.        Medication List     STOP taking these medications    BC HEADACHE POWDER PO   Blood Pressure Kit Kit   NICODERM CQ TD   nicotine 14 mg/24hr patch Commonly known as: NICODERM CQ - dosed in mg/24 hours   ondansetron 4 MG tablet Commonly known as: ZOFRAN   polyethylene glycol 17 g packet Commonly known as: MIRALAX / GLYCOLAX   senna-docusate 8.6-50 MG tablet Commonly known as: Senokot-S   Slow Fe 142 (45 Fe) MG Tbcr tablet Generic drug: ferrous sulfate ER   thiamine 100 MG tablet Commonly known as: Vitamin B-1   traMADol 50 MG tablet Commonly known as: ULTRAM       TAKE these medications    amLODipine 5 MG tablet Commonly known as: NORVASC Take 0.5 tablets (2.5 mg total) by mouth daily. What changed: Another medication with the same name was removed. Continue taking this medication, and follow the directions you see here.   aspirin EC 81 MG tablet Take 1 tablet (81 mg total) by mouth daily. Swallow whole.   buprenorphine 8 MG Subl SL tablet Commonly known as: SUBUTEX Place 0.5 tablets (4 mg total) under the tongue 2 (two) times daily.  What changed:  when to take this Another medication with the same name was removed. Continue taking this medication, and follow the directions you see here.   ciprofloxacin 500 MG tablet Commonly known as: Cipro Take 1 tablet (500 mg total) by mouth 2 (two) times daily for 5 days.   [employer] Vitamin D3 250 MCG (10000 UT) Caps Generic drug: Cholecalciferol Take 10,000 Units by mouth  every other day. What changed: Another medication with the same name was removed. Continue taking this medication, and follow the directions you see here.   lisinopril-hydrochlorothiazide 20-25 MG tablet Commonly known as: ZESTORETIC Take 1 tablet by mouth daily   metroNIDAZOLE 500 MG tablet Commonly known as: Flagyl Take 1 tablet (500 mg total) by mouth 3 (three) times daily for 5 days.   pravastatin 20 MG tablet Commonly known as: PRAVACHOL Take 1 tablet (20 mg total) by mouth daily.               Durable Medical Equipment  (From admission, onward)           Start     Ordered   01/22/23 1004  For home use only DME Shower stool  Once        01/22/23 1004            Allergies  Allergen Reactions   Penicillins Hives    Did it involve swelling of the face/tongue/throat, SOB, or low BP? No Did it involve sudden or severe rash/hives, skin peeling, or any reaction on the inside of your mouth or nose? Yes Did you need to seek medical attention at a hospital or doctor's office? Unknown When did it last happen?    childhood   If all above answers are "NO", may proceed with cephalosporin use.      Consultations: None   Procedures/Studies: CT ABDOMEN PELVIS W CONTRAST  Result Date: 01/20/2023 CLINICAL DATA:  Abdominal pain with nausea and vomiting. EXAM: CT ABDOMEN AND PELVIS WITH CONTRAST TECHNIQUE: Multidetector CT imaging of the abdomen and pelvis was performed using the standard protocol following bolus administration of intravenous contrast. RADIATION DOSE REDUCTION: This exam was performed according to the departmental dose-optimization program which includes automated exposure control, adjustment of the mA and/or kV according to patient size and/or use of iterative reconstruction technique. CONTRAST:  OMNIPAQUE IOHEXOL 300 MG/ML  SOLN COMPARISON:  08/24/2022 FINDINGS: Lower chest: Anterior bandlike scarring in the right lower lobe along with subsegmental  atelectasis in both lower lobes. Mild lingular scarring. Descending thoracic aortic atherosclerosis. Hepatobiliary: Focal hepatic steatosis adjacent to the falciform ligament most notable in segment 4b. Gallbladder unremarkable. Common bile duct is mildly dilated at 0.9 cm, previously similar caliber on 08/24/2022. No significant degree of intrahepatic biliary dilatation. Pancreas: Pancreas divisum. Spleen: Unremarkable Adrenals/Urinary Tract: Scattered scarring in the right kidney. Cortical thinning in both kidneys although left greater than right. Calyceal diverticulum in the right kidney upper pole. No significant focal renal lesions. Borderline wall thickening in the urinary bladder diffusely, correlate with urine analysis in assessing for cystitis. Stomach/Bowel: Fat deposition in the wall of the ascending and transverse colon, frequently incidental although this does have weak association with inflammatory bowel disease. Postoperative findings along the proximal colon. Terminal ileum unremarkable. Borderline wall thickening in the proximal colon with faintly accentuated mucosal enhancement in the ascending colon potentially from low-grade colitis. No surrounding mesenteric stranding is observed. Mild wall thickening in the sigmoid colon is primarily ascribable to nondistention. Vascular/Lymphatic: Mild aortoiliac atherosclerotic vascular calcification.  Reproductive: Unremarkable Other: No supplemental non-categorized findings. Musculoskeletal: 0.8 by 0.6 cm sclerotic lesion in the left L2 vertebral body on image 70 of series 7, no substantial change from 04/09/2021, stability favors benign process. IMPRESSION: 1. Borderline wall thickening in the proximal colon with faintly accentuated mucosal enhancement in the ascending colon potentially from low-grade colitis. 2. Fat deposition in the wall of the ascending and transverse colon, frequently incidental although this does have weak association with inflammatory  bowel disease. 3. Pancreas divisum. 4. Mild wall thickening in the urinary bladder, correlate with urine analysis in assessing for cystitis. 5. Aortic atherosclerosis. Aortic Atherosclerosis (ICD10-I70.0). Electronically Signed   By: Gaylyn Rong M.D.   On: 01/20/2023 19:19   DG Chest 2 View  Result Date: 01/20/2023 CLINICAL DATA:  Chest pain EXAM: CHEST - 2 VIEW COMPARISON:  Chest x-ray April 09, 2021 FINDINGS: The cardiomediastinal silhouette is unchanged in contour. No focal pulmonary opacity. No pleural effusion or pneumothorax. The visualized upper abdomen is unremarkable. No acute osseous abnormality. IMPRESSION: No active cardiopulmonary disease. Electronically Signed   By: Jacob Moores M.D.   On: 01/20/2023 16:48     Subjective: No complaints feels great tolerating her diet  Discharge Exam: Vitals:   01/21/23 1946 01/22/23 0506  BP: (!) 140/89 (!) 159/90  Pulse: 73 60  Resp:  20  Temp: 99.2 F (37.3 C) 98.6 F (37 C)  SpO2:  97%   Vitals:   01/21/23 0844 01/21/23 1246 01/21/23 1946 01/22/23 0506  BP: 112/79 (!) 132/91 (!) 140/89 (!) 159/90  Pulse: 75 76 73 60  Resp: 20 18  20   Temp: 98.9 F (37.2 C) 98.3 F (36.8 C) 99.2 F (37.3 C) 98.6 F (37 C)  TempSrc: Oral Oral Oral Oral  SpO2: 98% 99%  97%  Weight:      Height:        General: Pt is alert, awake, not in acute distress Cardiovascular: RRR, S1/S2 +, no rubs, no gallops Respiratory: CTA bilaterally, no wheezing, no rhonchi Abdominal: Soft, NT, ND, bowel sounds + Extremities: no edema, no cyanosis    The results of significant diagnostics from this hospitalization (including imaging, microbiology, ancillary and laboratory) are listed below for reference.     Microbiology: Recent Results (from the past 240 hour(s))  Urine Culture     Status: Abnormal   Collection Time: 01/20/23  5:57 PM   Specimen: Urine, Clean Catch  Result Value Ref Range Status   Specimen Description   Final    URINE,  CLEAN CATCH Performed at [facility], [address], [place], [place] [address]    Special Requests   Final    NONE Performed at [facility], [address], [place], [place] [address]    Culture >=100,000 COLONIES/mL ESCHERICHIA COLI (A)  Final   Report Status 01/22/2023 FINAL  Final   Organism ID, Bacteria ESCHERICHIA COLI (A)  Final      Susceptibility   Escherichia coli - MIC*    AMPICILLIN >=32 RESISTANT Resistant     CEFAZOLIN >=64 RESISTANT Resistant     CEFEPIME 0.5 SENSITIVE Sensitive     CEFTRIAXONE >=64 RESISTANT Resistant     CIPROFLOXACIN <=0.25 SENSITIVE Sensitive     GENTAMICIN <=1 SENSITIVE Sensitive     IMIPENEM <=0.25 SENSITIVE Sensitive     NITROFURANTOIN <=16 SENSITIVE Sensitive     TRIMETH/SULFA <=20 SENSITIVE Sensitive     AMPICILLIN/SULBACTAM >=32 RESISTANT Resistant     PIP/TAZO 8  SENSITIVE Sensitive     * >=100,000 COLONIES/mL ESCHERICHIA COLI     Labs: BNP (last 3 results) No results for input(s): "BNP" in the last 8760 hours. Basic Metabolic Panel: Recent Labs  Lab 01/20/23 1531 01/21/23 0552 01/22/23 0449  NA 136 134* 136  K 2.7* 3.0* 4.9  CL 95* 100 109  CO2 28 25 23   GLUCOSE 95 87 91  BUN 11 9 8   CREATININE 0.95 0.92 1.01*  CALCIUM 8.7* 8.1* 8.4*  MG 1.3* 1.7  --   PHOS  --  2.8  --    Liver Function Tests: Recent Labs  Lab 01/20/23 1531  AST 30  ALT 16  ALKPHOS 98  BILITOT 1.2  PROT 6.5  ALBUMIN 3.2*   Recent Labs  Lab 01/20/23 1531  LIPASE 31   No results for input(s): "AMMONIA" in the last 168 hours. CBC: Recent Labs  Lab 01/20/23 1531 01/21/23 0552  WBC 9.4 6.0  NEUTROABS 7.1  --   HGB 14.4 12.2  HCT 40.8 35.3*  MCV 94.2 95.1  PLT 288 251   Cardiac Enzymes: No results for input(s): "CKTOTAL", "CKMB", "CKMBINDEX", "TROPONINI" in the last 168 hours. BNP: Invalid input(s): "POCBNP" CBG: No results for input(s): "GLUCAP" in the last 168 hours. D-Dimer No results for  input(s): "DDIMER" in the last 72 hours. Hgb A1c No results for input(s): "HGBA1C" in the last 72 hours. Lipid Profile No results for input(s): "CHOL", "HDL", "LDLCALC", "TRIG", "CHOLHDL", "LDLDIRECT" in the last 72 hours. Thyroid function studies No results for input(s): "TSH", "T4TOTAL", "T3FREE", "THYROIDAB" in the last 72 hours.  Invalid input(s): "FREET3" Anemia work up No results for input(s): "VITAMINB12", "FOLATE", "FERRITIN", "TIBC", "IRON", "RETICCTPCT" in the last 72 hours. Urinalysis    Component Value Date/Time   COLORURINE AMBER (A) 01/20/2023 1751   APPEARANCEUR CLOUDY (A) 01/20/2023 1751   LABSPEC 1.009 01/20/2023 1751   PHURINE 6.0 01/20/2023 1751   GLUCOSEU NEGATIVE 01/20/2023 1751   HGBUR SMALL (A) 01/20/2023 1751   BILIRUBINUR NEGATIVE 01/20/2023 1751   BILIRUBINUR moderate (A) 03/24/2021 0943   KETONESUR 5 (A) 01/20/2023 1751   PROTEINUR 30 (A) 01/20/2023 1751   UROBILINOGEN 2.0 (A) 03/24/2021 0943   UROBILINOGEN 0.2 04/24/2009 0504   NITRITE POSITIVE (A) 01/20/2023 1751   LEUKOCYTESUR LARGE (A) 01/20/2023 1751   Sepsis Labs Recent Labs  Lab 01/20/23 1531 01/21/23 0552  WBC 9.4 6.0   Microbiology Recent Results (from the past 240 hour(s))  Urine Culture     Status: Abnormal   Collection Time: 01/20/23  5:57 PM   Specimen: Urine, Clean Catch  Result Value Ref Range Status   Specimen Description   Final    URINE, CLEAN CATCH Performed at [facility], [address], [place], [place] [address]    Special Requests   Final    NONE Performed at [facility], [address] [place], [place] [address]    Culture >=100,000 COLONIES/mL ESCHERICHIA COLI (A)  Final   Report Status 01/22/2023 FINAL  Final   Organism ID, Bacteria ESCHERICHIA COLI (A)  Final      Susceptibility   Escherichia coli - MIC*    AMPICILLIN >=32 RESISTANT Resistant     CEFAZOLIN >=64 RESISTANT Resistant     CEFEPIME 0.5 SENSITIVE  Sensitive     CEFTRIAXONE >=64 RESISTANT Resistant     CIPROFLOXACIN <=0.25 SENSITIVE Sensitive     GENTAMICIN <=1 SENSITIVE Sensitive     IMIPENEM <=0.25 SENSITIVE Sensitive  NITROFURANTOIN <=16 SENSITIVE Sensitive     TRIMETH/SULFA <=20 SENSITIVE Sensitive     AMPICILLIN/SULBACTAM >=32 RESISTANT Resistant     PIP/TAZO 8 SENSITIVE Sensitive     * >=100,000 COLONIES/mL ESCHERICHIA COLI    SIGNED:   Marinda Elk, MD  [facility] 01/22/2023, 1:00 PM Pager   If 7PM-7AM, please contact night-coverage [email or web address] Password [email or web address]

## 2023-01-22 NOTE — TOC Initial Note (Addendum)
Transition of Care [facility]) - Initial/Assessment Note    Patient Details  Name: [name] MRN: [id number] Date of Birth: [birth year]  [facility] CM/SW Contact:    Adrian Prows, RN Phone Number: 01/22/2023, 9:52 AM  Clinical Narrative:                 [facility] consult for SDOH risk med assist; spoke w/ pt in room; pt identified POC Scottie Laurence Compton [phone]); pt says she has transportation today, but she needs help getting around time; she verified she has insurance and PCP; pt denies IPV; she says she has food insecurity and difficulty paying her utilities; she ahs upper dentures, cane,and walker; she does not have HH services or home oxygen; pt agrees to receiving resources for social services, transportation, financial assistance, and food pantries; she will make appts at agencies of choice; resources placed in d/c instructions; and copies given to pt; no TOC needs.  -1005- order received for shower chair; spoke w/ Vaughan Basta at [facility]; DME will be delivered to room; pt notified;.  Expected Discharge Plan: Home/Self Care Barriers to Discharge: No Barriers Identified   Patient Goals and CMS Choice Patient states their goals for this hospitalization and ongoing recovery are:: home   Choice offered to / list presented to : NA      Expected Discharge Plan and Services   Discharge Planning Services: CM Consult Post Acute Care Choice: NA Living arrangements for the past 2 months: Single Family Home Expected Discharge Date: 01/22/23                 DME Agency: NA       HH Arranged: NA HH Agency: NA        Prior Living Arrangements/Services Living arrangements for the past 2 months: Single Family Home Lives with:: Self Patient language and need for interpreter reviewed:: Yes Do you feel safe going back to the place where you live?: Yes      Need for Family Participation in Patient Care: Yes (Comment) Care giver support system in place?: Yes  (comment) Current home services: DME (cane, walker) Criminal Activity/Legal Involvement Pertinent to Current Situation/Hospitalization: No - Comment as needed  Activities of Daily Living Home Assistive Devices/Equipment: Cane (specify quad or straight) ADL Screening (condition at time of admission) Patient's cognitive ability adequate to safely complete daily activities?: Yes Is the patient deaf or have difficulty hearing?: No Does the patient have difficulty seeing, even when wearing glasses/contacts?: No Does the patient have difficulty concentrating, remembering, or making decisions?: No Patient able to express need for assistance with ADLs?: Yes Does the patient have difficulty dressing or bathing?: Yes Independently performs ADLs?: No Communication: Independent Dressing (OT): Needs assistance Is this a change from baseline?: Pre-admission baseline Grooming: Needs assistance Is this a change from baseline?: Pre-admission baseline Feeding: Independent Bathing: Dependent Is this a change from baseline?: Pre-admission baseline Toileting: Independent, Independent with device (comment) In/Out Bed: Independent with device (comment) Walks in Home: Independent with device (comment) Does the patient have difficulty walking or climbing stairs?: Yes Weakness of Legs: Both Weakness of Arms/Hands: None  Permission Sought/Granted Permission sought to share information with : Case Manager Permission granted to share information with : Yes, Verbal Permission Granted  Share Information with NAME: Case Manager     Permission granted to share info w Relationship: Benard Halsted (friend) [phone]     Emotional Assessment Appearance:: Appears stated age Attitude/Demeanor/Rapport: Gracious Affect (typically observed): Accepting Orientation: : Oriented to  Self, Oriented to Place, Oriented to  Time, Oriented to Situation Alcohol / Substance Use: Not Applicable Psych Involvement: No  (comment)  Admission diagnosis:  Hypokalemia [E87.6] Hypomagnesemia [E83.42] Colitis [K52.9] Intractable nausea and vomiting [R11.2] Cystitis [N30.90] Nausea and vomiting, unspecified vomiting type [R11.2] UTI (urinary tract infection) [N39.0] Patient Active Problem List   Diagnosis Date Noted   Hypomagnesemia 01/21/2023   UTI (urinary tract infection) 01/21/2023   Intractable nausea and vomiting 01/20/2023   Malnutrition of moderate degree 09/16/2022   Acute metabolic encephalopathy 09/14/2022   Nausea and vomiting 09/14/2022   Elevated troponin 09/13/2022   Elevated troponin I level 09/12/2022   Alcohol use 09/12/2022   AKI (acute kidney injury) (HCC) 09/12/2022   Hypokalemia 09/12/2022   Opiate withdrawal (HCC) - possible 09/12/2022   Cellulitis of right index finger 09/12/2022   Major depressive disorder    Generalized anxiety disorder    History of heroin use    Crohn's disease    Obstructive sleep apnea    Gout 04/09/2021   Dilated cbd, acquired 04/09/2021   Chronic pain syndrome 04/09/2021   Benign essential hypertension 04/09/2021   Alcohol dependence in sustained full remission    PCP:  [facility]:   [facility] [address], [address] [place] [address] Phone: [phone] Fax: [phone]     Social Determinants of Health (SDOH) Social History: SDOH Screenings   Food Insecurity: Food Insecurity Present (01/22/2023)  Housing: Low Risk  (01/22/2023)  Recent Concern: Housing - Medium Risk (01/21/2023)  Transportation Needs: Unmet Transportation Needs (01/22/2023)  Utilities: At Risk (01/22/2023)  Alcohol Screen: Low Risk  (04/22/2021)  Depression (PHQ2-9): Low Risk  (01/04/2022)  Tobacco Use: High Risk (01/20/2023)   SDOH Interventions: Food Insecurity Interventions: Inpatient TOC Housing Interventions: Inpatient TOC Transportation Interventions: Inpatient TOC   Readmission Risk  Interventions     No data to display

## 2023-01-23 ENCOUNTER — Other Ambulatory Visit: Payer: Self-pay

## 2023-01-24 ENCOUNTER — Other Ambulatory Visit: Payer: Self-pay

## 2023-01-31 ENCOUNTER — Other Ambulatory Visit: Payer: Self-pay

## 2023-01-31 MED ORDER — NALOXONE HCL 4 MG/0.1ML NA LIQD
NASAL | 0 refills | Status: DC
  Filled 2023-01-31: qty 2, 2d supply, fill #0

## 2023-02-01 ENCOUNTER — Other Ambulatory Visit: Payer: Self-pay

## 2023-02-01 MED ORDER — BUPRENORPHINE HCL 8 MG SL SUBL
4.0000 mg | SUBLINGUAL_TABLET | Freq: Two times a day (BID) | SUBLINGUAL | 0 refills | Status: DC
  Filled 2023-02-03: qty 30, 30d supply, fill #0

## 2023-02-01 MED ORDER — TRAMADOL HCL 50 MG PO TABS
50.0000 mg | ORAL_TABLET | Freq: Three times a day (TID) | ORAL | 0 refills | Status: DC | PRN
  Filled 2023-02-03: qty 180, 30d supply, fill #0

## 2023-02-03 ENCOUNTER — Other Ambulatory Visit: Payer: Self-pay

## 2023-03-01 ENCOUNTER — Other Ambulatory Visit: Payer: Self-pay

## 2023-03-01 MED ORDER — AMLODIPINE BESYLATE 5 MG PO TABS
5.0000 mg | ORAL_TABLET | Freq: Every day | ORAL | 2 refills | Status: DC
  Filled 2023-03-01: qty 90, 90d supply, fill #0

## 2023-03-01 MED ORDER — TRAMADOL HCL 50 MG PO TABS
50.0000 mg | ORAL_TABLET | Freq: Three times a day (TID) | ORAL | 0 refills | Status: DC | PRN
  Filled 2023-03-03: qty 180, 30d supply, fill #0

## 2023-03-01 MED ORDER — BUPRENORPHINE HCL 8 MG SL SUBL
4.0000 mg | SUBLINGUAL_TABLET | Freq: Two times a day (BID) | SUBLINGUAL | 0 refills | Status: DC
  Filled 2023-03-06: qty 30, 30d supply, fill #0

## 2023-03-03 ENCOUNTER — Other Ambulatory Visit: Payer: Self-pay

## 2023-03-06 ENCOUNTER — Other Ambulatory Visit: Payer: Self-pay

## 2023-03-13 ENCOUNTER — Other Ambulatory Visit: Payer: Self-pay

## 2023-03-13 MED ORDER — AMLODIPINE BESYLATE 5 MG PO TABS
5.0000 mg | ORAL_TABLET | Freq: Every day | ORAL | 2 refills | Status: DC
  Filled 2023-03-13: qty 90, 90d supply, fill #0

## 2023-03-13 MED ORDER — AMLODIPINE BESYLATE 5 MG PO TABS
5.0000 mg | ORAL_TABLET | Freq: Every day | ORAL | 2 refills | Status: DC
  Filled 2023-03-13 (×2): qty 90, 90d supply, fill #0

## 2023-03-13 MED ORDER — PRAVASTATIN SODIUM 20 MG PO TABS
20.0000 mg | ORAL_TABLET | Freq: Every day | ORAL | 2 refills | Status: DC
  Filled 2023-03-13 – 2023-07-31 (×2): qty 30, 30d supply, fill #0
  Filled 2023-10-26: qty 30, 30d supply, fill #1
  Filled 2023-12-18: qty 30, 30d supply, fill #2

## 2023-03-17 ENCOUNTER — Other Ambulatory Visit: Payer: Self-pay

## 2023-03-29 ENCOUNTER — Other Ambulatory Visit: Payer: Self-pay

## 2023-03-29 MED ORDER — BUPRENORPHINE HCL 8 MG SL SUBL
4.0000 mg | SUBLINGUAL_TABLET | Freq: Two times a day (BID) | SUBLINGUAL | 0 refills | Status: DC
  Filled 2023-03-29 – 2023-04-05 (×2): qty 30, 30d supply, fill #0

## 2023-03-30 ENCOUNTER — Other Ambulatory Visit: Payer: Self-pay

## 2023-03-30 MED ORDER — TRAMADOL HCL 50 MG PO TABS
50.0000 mg | ORAL_TABLET | Freq: Three times a day (TID) | ORAL | 0 refills | Status: DC | PRN
  Filled 2023-04-05: qty 180, 30d supply, fill #0

## 2023-04-04 ENCOUNTER — Other Ambulatory Visit: Payer: Self-pay

## 2023-04-05 ENCOUNTER — Other Ambulatory Visit: Payer: Self-pay

## 2023-04-10 ENCOUNTER — Other Ambulatory Visit: Payer: Self-pay

## 2023-04-18 ENCOUNTER — Other Ambulatory Visit: Payer: Self-pay

## 2023-04-18 MED ORDER — ALBUTEROL SULFATE HFA 108 (90 BASE) MCG/ACT IN AERS
2.0000 | INHALATION_SPRAY | Freq: Two times a day (BID) | RESPIRATORY_TRACT | 3 refills | Status: DC
  Filled 2023-04-18: qty 18, 50d supply, fill #0

## 2023-04-26 ENCOUNTER — Other Ambulatory Visit: Payer: Self-pay

## 2023-05-02 ENCOUNTER — Other Ambulatory Visit: Payer: Self-pay

## 2023-05-02 MED ORDER — BUPRENORPHINE HCL 8 MG SL SUBL
4.0000 mg | SUBLINGUAL_TABLET | Freq: Two times a day (BID) | SUBLINGUAL | 0 refills | Status: DC
  Filled 2023-05-08 (×2): qty 30, 30d supply, fill #0

## 2023-05-02 MED ORDER — TRAMADOL HCL 50 MG PO TABS
50.0000 mg | ORAL_TABLET | Freq: Three times a day (TID) | ORAL | 0 refills | Status: DC | PRN
  Filled 2023-05-08: qty 180, 30d supply, fill #0

## 2023-05-08 ENCOUNTER — Other Ambulatory Visit: Payer: Self-pay

## 2023-05-15 ENCOUNTER — Other Ambulatory Visit: Payer: Self-pay

## 2023-05-24 ENCOUNTER — Other Ambulatory Visit: Payer: Self-pay

## 2023-05-24 ENCOUNTER — Emergency Department (HOSPITAL_COMMUNITY): Payer: MEDICAID

## 2023-05-24 ENCOUNTER — Inpatient Hospital Stay (HOSPITAL_COMMUNITY)
Admission: EM | Admit: 2023-05-24 | Discharge: 2023-05-29 | DRG: 392 | Disposition: A | Discharge status: Home / Self Care | Payer: MEDICAID | Attending: Internal Medicine | Admitting: Internal Medicine

## 2023-05-24 ENCOUNTER — Observation Stay (HOSPITAL_COMMUNITY): Payer: MEDICAID

## 2023-05-24 ENCOUNTER — Encounter (HOSPITAL_COMMUNITY): Payer: Self-pay

## 2023-05-24 DIAGNOSIS — Z8349 Family history of other endocrine, nutritional and metabolic diseases: Secondary | ICD-10-CM

## 2023-05-24 DIAGNOSIS — M20001 Unspecified deformity of right finger(s): Secondary | ICD-10-CM

## 2023-05-24 DIAGNOSIS — M154 Erosive (osteo)arthritis: Secondary | ICD-10-CM | POA: Diagnosis present

## 2023-05-24 DIAGNOSIS — R9431 Abnormal electrocardiogram [ECG] [EKG]: Secondary | ICD-10-CM

## 2023-05-24 DIAGNOSIS — R079 Chest pain, unspecified: Secondary | ICD-10-CM

## 2023-05-24 DIAGNOSIS — I10 Essential (primary) hypertension: Secondary | ICD-10-CM | POA: Diagnosis present

## 2023-05-24 DIAGNOSIS — R200 Anesthesia of skin: Secondary | ICD-10-CM | POA: Diagnosis present

## 2023-05-24 DIAGNOSIS — R0789 Other chest pain: Secondary | ICD-10-CM

## 2023-05-24 DIAGNOSIS — R202 Paresthesia of skin: Secondary | ICD-10-CM | POA: Diagnosis present

## 2023-05-24 DIAGNOSIS — Z79899 Other long term (current) drug therapy: Secondary | ICD-10-CM

## 2023-05-24 DIAGNOSIS — Z87442 Personal history of urinary calculi: Secondary | ICD-10-CM

## 2023-05-24 DIAGNOSIS — R7989 Other specified abnormal findings of blood chemistry: Secondary | ICD-10-CM

## 2023-05-24 DIAGNOSIS — M109 Gout, unspecified: Secondary | ICD-10-CM | POA: Diagnosis present

## 2023-05-24 DIAGNOSIS — E876 Hypokalemia: Principal | ICD-10-CM | POA: Diagnosis present

## 2023-05-24 DIAGNOSIS — G4733 Obstructive sleep apnea (adult) (pediatric): Secondary | ICD-10-CM | POA: Diagnosis present

## 2023-05-24 DIAGNOSIS — Z880 Allergy status to penicillin: Secondary | ICD-10-CM

## 2023-05-24 DIAGNOSIS — R197 Diarrhea, unspecified: Secondary | ICD-10-CM | POA: Diagnosis present

## 2023-05-24 DIAGNOSIS — F17210 Nicotine dependence, cigarettes, uncomplicated: Secondary | ICD-10-CM | POA: Diagnosis present

## 2023-05-24 DIAGNOSIS — R112 Nausea with vomiting, unspecified: Principal | ICD-10-CM

## 2023-05-24 DIAGNOSIS — K7581 Nonalcoholic steatohepatitis (NASH): Secondary | ICD-10-CM | POA: Diagnosis present

## 2023-05-24 DIAGNOSIS — Z1152 Encounter for screening for COVID-19: Secondary | ICD-10-CM

## 2023-05-24 DIAGNOSIS — K5090 Crohn's disease, unspecified, without complications: Secondary | ICD-10-CM | POA: Diagnosis present

## 2023-05-24 DIAGNOSIS — F1021 Alcohol dependence, in remission: Secondary | ICD-10-CM | POA: Diagnosis present

## 2023-05-24 DIAGNOSIS — F32A Depression, unspecified: Secondary | ICD-10-CM | POA: Diagnosis present

## 2023-05-24 DIAGNOSIS — Z79891 Long term (current) use of opiate analgesic: Secondary | ICD-10-CM

## 2023-05-24 DIAGNOSIS — F1191 Opioid use, unspecified, in remission: Secondary | ICD-10-CM

## 2023-05-24 DIAGNOSIS — G894 Chronic pain syndrome: Secondary | ICD-10-CM | POA: Diagnosis present

## 2023-05-24 DIAGNOSIS — Z720 Tobacco use: Secondary | ICD-10-CM

## 2023-05-24 DIAGNOSIS — D649 Anemia, unspecified: Secondary | ICD-10-CM | POA: Diagnosis present

## 2023-05-24 DIAGNOSIS — R051 Acute cough: Secondary | ICD-10-CM

## 2023-05-24 DIAGNOSIS — F411 Generalized anxiety disorder: Secondary | ICD-10-CM | POA: Diagnosis present

## 2023-05-24 LAB — CBC WITH DIFFERENTIAL/PLATELET
Abs Immature Granulocytes: 0.02 10*3/uL (ref 0.00–0.07)
Basophils Absolute: 0 10*3/uL (ref 0.0–0.1)
Basophils Relative: 0 %
Eosinophils Absolute: 0 10*3/uL (ref 0.0–0.5)
Eosinophils Relative: 0 %
HCT: 34.1 % — ABNORMAL LOW (ref 36.0–46.0)
Hemoglobin: 11.9 g/dL — ABNORMAL LOW (ref 12.0–15.0)
Immature Granulocytes: 0 %
Lymphocytes Relative: 18 %
Lymphs Abs: 1.4 10*3/uL (ref 0.7–4.0)
MCH: 35.7 pg — ABNORMAL HIGH (ref 26.0–34.0)
MCHC: 34.9 g/dL (ref 30.0–36.0)
MCV: 102.4 fL — ABNORMAL HIGH (ref 80.0–100.0)
Monocytes Absolute: 0.5 10*3/uL (ref 0.1–1.0)
Monocytes Relative: 6 %
Neutro Abs: 5.9 10*3/uL (ref 1.7–7.7)
Neutrophils Relative %: 76 %
Platelets: 290 10*3/uL (ref 150–400)
RBC: 3.33 MIL/uL — ABNORMAL LOW (ref 3.87–5.11)
RDW: 13.8 % (ref 11.5–15.5)
WBC: 7.8 10*3/uL (ref 4.0–10.5)
nRBC: 0 % (ref 0.0–0.2)

## 2023-05-24 LAB — COMPREHENSIVE METABOLIC PANEL
ALT: 26 U/L (ref 0–44)
AST: 76 U/L — ABNORMAL HIGH (ref 15–41)
Albumin: 3.2 g/dL — ABNORMAL LOW (ref 3.5–5.0)
Alkaline Phosphatase: 144 U/L — ABNORMAL HIGH (ref 38–126)
Anion gap: 15 (ref 5–15)
BUN: 8 mg/dL (ref 6–20)
CO2: 27 mmol/L (ref 22–32)
Calcium: 8 mg/dL — ABNORMAL LOW (ref 8.9–10.3)
Chloride: 95 mmol/L — ABNORMAL LOW (ref 98–111)
Creatinine, Ser: 0.83 mg/dL (ref 0.44–1.00)
GFR, Estimated: >60 mL/min (ref >60)
Glucose, Bld: 116 mg/dL — ABNORMAL HIGH (ref 70–99)
Potassium: 2.2 mmol/L — CL (ref 3.5–5.1)
Sodium: 137 mmol/L (ref 135–145)
Total Bilirubin: 1.4 mg/dL — ABNORMAL HIGH (ref 0.3–1.2)
Total Protein: 6.5 g/dL (ref 6.5–8.1)

## 2023-05-24 LAB — PHOSPHORUS: Phosphorus: 2 mg/dL — ABNORMAL LOW (ref 2.5–4.6)

## 2023-05-24 LAB — RESP PANEL BY RT-PCR (RSV, FLU A&B, COVID)  RVPGX2
Influenza A by PCR: NEGATIVE
Influenza B by PCR: NEGATIVE
Resp Syncytial Virus by PCR: NEGATIVE
SARS Coronavirus 2 by RT PCR: NEGATIVE

## 2023-05-24 LAB — RAPID URINE DRUG SCREEN, HOSP PERFORMED
Amphetamines: NOT DETECTED
Barbiturates: NOT DETECTED
Benzodiazepines: NOT DETECTED
Cocaine: NOT DETECTED
Opiates: NOT DETECTED
Tetrahydrocannabinol: NOT DETECTED

## 2023-05-24 LAB — LIPASE, BLOOD: Lipase: 26 U/L (ref 11–51)

## 2023-05-24 LAB — URIC ACID: Uric Acid, Serum: 13.6 mg/dL — ABNORMAL HIGH (ref 2.5–7.1)

## 2023-05-24 LAB — MAGNESIUM: Magnesium: 1.3 mg/dL — ABNORMAL LOW (ref 1.7–2.4)

## 2023-05-24 LAB — TROPONIN I (HIGH SENSITIVITY): Troponin I (High Sensitivity): 15 ng/L (ref <18)

## 2023-05-24 MED ORDER — MAGNESIUM SULFATE 2 GM/50ML IV SOLN
2.0000 g | Freq: Once | INTRAVENOUS | Status: AC
  Administered 2023-05-24: 2 g via INTRAVENOUS
  Filled 2023-05-24: qty 50

## 2023-05-24 MED ORDER — IOHEXOL 300 MG/ML  SOLN
100.0000 mL | Freq: Once | INTRAMUSCULAR | Status: AC | PRN
  Administered 2023-05-24: 100 mL via INTRAVENOUS

## 2023-05-24 MED ORDER — SODIUM CHLORIDE 0.9 % IV SOLN: INTRAVENOUS | Status: DC

## 2023-05-24 MED ORDER — POTASSIUM CHLORIDE 10 MEQ/100ML IV SOLN
10.0000 meq | INTRAVENOUS | Status: AC
  Administered 2023-05-24 (×2): 10 meq via INTRAVENOUS
  Filled 2023-05-24 (×2): qty 100

## 2023-05-24 MED ORDER — ONDANSETRON HCL 4 MG/2ML IJ SOLN
4.0000 mg | Freq: Once | INTRAMUSCULAR | Status: AC
  Administered 2023-05-24: 4 mg via INTRAVENOUS
  Filled 2023-05-24: qty 2

## 2023-05-24 MED ORDER — POTASSIUM CHLORIDE CRYS ER 20 MEQ PO TBCR
40.0000 meq | EXTENDED_RELEASE_TABLET | Freq: Once | ORAL | Status: AC
  Administered 2023-05-24: 40 meq via ORAL
  Filled 2023-05-24: qty 2

## 2023-05-24 MED ORDER — NICOTINE 14 MG/24HR TD PT24
14.0000 mg | MEDICATED_PATCH | Freq: Every day | TRANSDERMAL | Status: DC
  Administered 2023-05-24 – 2023-05-29 (×6): 14 mg via TRANSDERMAL
  Filled 2023-05-24 (×6): qty 1

## 2023-05-24 MED ORDER — ENOXAPARIN SODIUM 40 MG/0.4ML IJ SOSY
40.0000 mg | PREFILLED_SYRINGE | INTRAMUSCULAR | Status: DC
  Administered 2023-05-25 – 2023-05-29 (×5): 40 mg via SUBCUTANEOUS
  Filled 2023-05-24 (×5): qty 0.4

## 2023-05-24 MED ORDER — POTASSIUM & SODIUM PHOSPHATES 280-160-250 MG PO PACK
1.0000 | PACK | Freq: Three times a day (TID) | ORAL | Status: AC
  Administered 2023-05-24 – 2023-05-25 (×4): 1 via ORAL
  Filled 2023-05-24 (×5): qty 1

## 2023-05-24 MED ORDER — SODIUM CHLORIDE 0.9 % IV BOLUS
1000.0000 mL | Freq: Once | INTRAVENOUS | Status: AC
  Administered 2023-05-24: 1000 mL via INTRAVENOUS

## 2023-05-24 MED ORDER — LORAZEPAM 0.5 MG PO TABS
0.5000 mg | ORAL_TABLET | Freq: Four times a day (QID) | ORAL | Status: DC | PRN
  Administered 2023-05-25 – 2023-05-28 (×7): 0.5 mg via ORAL
  Filled 2023-05-24 (×7): qty 1

## 2023-05-24 MED ORDER — LOPERAMIDE HCL 2 MG PO CAPS
2.0000 mg | ORAL_CAPSULE | ORAL | Status: DC | PRN
  Administered 2023-05-26 – 2023-05-27 (×4): 2 mg via ORAL
  Filled 2023-05-24 (×4): qty 1

## 2023-05-24 MED ORDER — BUPRENORPHINE HCL 2 MG SL SUBL
4.0000 mg | SUBLINGUAL_TABLET | Freq: Every day | SUBLINGUAL | Status: DC
  Administered 2023-05-24 – 2023-05-29 (×6): 4 mg via SUBLINGUAL
  Filled 2023-05-24 (×6): qty 2

## 2023-05-24 NOTE — Assessment & Plan Note (Signed)
-  Likely secondary to hypokalemia and hypomagnesia. Will replete and repeat EKG in the morning.

## 2023-05-24 NOTE — Assessment & Plan Note (Signed)
-  along with numbness of left arm and tingling of lips. Likely from electrolyte derangement.  -will check stat Troponin

## 2023-05-24 NOTE — ED Notes (Signed)
Pt to bathroom with cane and standby assist. Will attempt to obtain urine and stool sample

## 2023-05-24 NOTE — Assessment & Plan Note (Addendum)
-  Likely due to fatty infiltrate of the liver -follow repeat CMP in the morning

## 2023-05-24 NOTE — Assessment & Plan Note (Signed)
-  administered IV Mg

## 2023-05-24 NOTE — Assessment & Plan Note (Signed)
-  pt on Suboxone. Has been verified to be receiving 8mg  daily prescriptions. However pt states she only wants 4mg  daily here

## 2023-05-24 NOTE — Assessment & Plan Note (Signed)
-  Erythema and joint deformity to DIP joint of right index finger and MCP joint of the right thumb. Suspect chronic gouty arthritis.  -obtain right hand X-ray, uric acid

## 2023-05-24 NOTE — Assessment & Plan Note (Signed)
-   nicotine patch ?

## 2023-05-24 NOTE — Assessment & Plan Note (Signed)
-  possibly viral.  -no urinary symptoms  -No findings on CT Ab/pelvis -Lipase within normal limits -check UDS  -Imodium PRN  -keep on continuous IV fluids -advance diet as tolerated

## 2023-05-24 NOTE — ED Triage Notes (Signed)
Patient BIB EMS for flu-like symptoms. Patient reports cough, congestion, and fevers x 1 week.

## 2023-05-24 NOTE — Assessment & Plan Note (Signed)
-  No acute inflammatory changes noted on CT abd/pelvis

## 2023-05-24 NOTE — Assessment & Plan Note (Signed)
-  replete with oral phosphate

## 2023-05-24 NOTE — ED Provider Notes (Cosign Needed Addendum)
[place] EMERGENCY DEPARTMENT AT [facility] Provider Note   CSN: [id number] Arrival date & time: 05/24/23  1259     History  Chief Complaint  Patient presents with   URI    [name] is a 52 y.o. female with PMHx HTN, chronic pain syndrome, Crohn's disease, who presents to ED concerned with chills, productive cough, nausea, vomiting, diarrhea, anorexia x7 days. Denies dysuria, hematuria, hematemesis, hematochezia.    URI      Home Medications Prior to Admission medications   Medication Sig Start Date End Date Taking? Authorizing Provider  albuterol (VENTOLIN HFA) 108 (90 Base) MCG/ACT inhaler Inhale 2 puffs into the lungs 2 (two) times daily. 04/18/23     amLODipine (NORVASC) 5 MG tablet Take 0.5 tablets (2.5 mg total) by mouth daily. Patient not taking: Reported on 01/20/2023 09/20/22   Alwyn Ren, MD  amLODipine (NORVASC) 5 MG tablet Take 1 tablet (5 mg total) by mouth daily. 03/01/23     amLODipine (NORVASC) 5 MG tablet Take 1 tablet by mouth daily 03/13/23     amLODipine (NORVASC) 5 MG tablet Take 1 tablet (5 mg total) by mouth daily. 03/13/23     aspirin EC 81 MG tablet Take 1 tablet (81 mg total) by mouth daily. Swallow whole. Patient not taking: Reported on 01/20/2023 09/19/22   Alwyn Ren, MD  buprenorphine (SUBUTEX) 8 MG SUBL SL tablet Place 1/2 tablets (4 mg total) under the tongue 2 (two) times daily. 03/29/23     buprenorphine (SUBUTEX) 8 MG SUBL SL tablet Place 0.5 tablets (4 mg total) under the tongue 2 (two) times daily. 05/05/23     Cholecalciferol ([employer] VITAMIN D3) 250 MCG (10000 UT) CAPS Take 10,000 Units by mouth every other day.    [provider]  ciprofloxacin (CIPRO) 500 MG tablet Take 1 tablet (500 mg total) by mouth 2 (two) times daily. 01/22/23   Marinda Elk, MD  lisinopril-hydrochlorothiazide (ZESTORETIC) 20-25 MG tablet Take 1 tablet by mouth daily Patient not taking: Reported on 01/20/2023 11/02/22      naloxone (NARCAN) nasal spray 4 mg/0.1 mL Inhale 1 spray by nasal route as needed, max daily dose: 4 Applicator, IF FOUND UNRESPONSIVE THEN SPRAY INTO NOSE AND CALL 911 IMMEDIATELY 01/31/23     pravastatin (PRAVACHOL) 20 MG tablet Take 1 tablet (20 mg total) by mouth daily. 12/09/22     pravastatin (PRAVACHOL) 20 MG tablet Take 1 tablet (20 mg total) by mouth daily. 03/13/23     traMADol (ULTRAM) 50 MG tablet Take 1-2 tablets (50-100 mg total) by mouth every 8 (eight) hours as needed. 03/01/23     traMADol (ULTRAM) 50 MG tablet Take 1-2 tablets (50-100 mg total) by mouth every 8 (eight) hours as needed. 04/02/23     traMADol (ULTRAM) 50 MG tablet Take 1-2 tablets (50-100 mg total) by mouth every 8 (eight) hours as needed. 05/05/23         Allergies    Penicillins    Review of Systems   Review of Systems  Gastrointestinal:  Positive for diarrhea, nausea and vomiting.    Physical Exam Updated Vital Signs BP 125/89   Pulse 75   Temp 99.4 F (37.4 C) (Oral)   Resp 18   Ht 5\' 6"  (1.676 m)   Wt 51.3 kg   SpO2 100%   BMI 18.24 kg/m  Physical Exam Vitals and nursing note reviewed.  Constitutional:      General: She is not  in acute distress.    Appearance: She is not ill-appearing or toxic-appearing.  HENT:     Head: Normocephalic and atraumatic.     Mouth/Throat:     Mouth: Mucous membranes are moist.  Eyes:     General: No scleral icterus.       Right eye: No discharge.        Left eye: No discharge.     Conjunctiva/sclera: Conjunctivae normal.  Cardiovascular:     Rate and Rhythm: Normal rate and regular rhythm.     Pulses: Normal pulses.     Heart sounds: Normal heart sounds. No murmur heard. Pulmonary:     Effort: Pulmonary effort is normal. No respiratory distress.     Breath sounds: Normal breath sounds. No wheezing, rhonchi or rales.  Abdominal:     General: Abdomen is flat. Bowel sounds are normal. There is no distension.     Palpations: Abdomen is soft. There is no  mass.     Tenderness: There is abdominal tenderness.     Comments: Mild generalized tenderness to palpation  Musculoskeletal:     Right lower leg: No edema.     Left lower leg: No edema.  Skin:    General: Skin is warm and dry.     Findings: No rash.  Neurological:     General: No focal deficit present.     Mental Status: She is alert. Mental status is at baseline.  Psychiatric:        Mood and Affect: Mood normal.        Behavior: Behavior normal.     ED Results / Procedures / Treatments   Labs (all labs ordered are listed, but only abnormal results are displayed) Labs Reviewed  CBC WITH DIFFERENTIAL/PLATELET - Abnormal; Notable for the following components:      Result Value   RBC 3.33 (*)    Hemoglobin 11.9 (*)    HCT 34.1 (*)    MCV 102.4 (*)    MCH 35.7 (*)    All other components within normal limits  COMPREHENSIVE METABOLIC PANEL - Abnormal; Notable for the following components:   Potassium 2.2 (*)    Chloride 95 (*)    Glucose, Bld 116 (*)    Calcium 8.0 (*)    Albumin 3.2 (*)    AST 76 (*)    Alkaline Phosphatase 144 (*)    Total Bilirubin 1.4 (*)    All other components within normal limits  MAGNESIUM - Abnormal; Notable for the following components:   Magnesium 1.3 (*)    All other components within normal limits  PHOSPHORUS - Abnormal; Notable for the following components:   Phosphorus 2.0 (*)    All other components within normal limits  RESP PANEL BY RT-PCR (RSV, FLU A&B, COVID)  RVPGX2  LIPASE, BLOOD    EKG None  Radiology DG Chest 2 View  Result Date: 05/24/2023 CLINICAL DATA:  Cough. EXAM: CHEST - 2 VIEW COMPARISON:  01/20/2023. FINDINGS: Bilateral lungs appear hyperlucent with coarse bronchovascular markings, in keeping with COPD. Bilateral lungs otherwise appear clear. No dense consolidation. Bilateral costophrenic angles are clear. Normal cardio-mediastinal silhouette. No acute osseous abnormalities. The soft tissues are within normal  limits. IMPRESSION: 1. COPD. No active cardiopulmonary disease. Electronically Signed   By: Jules Schick M.D.   On: 05/24/2023 18:34    Procedures .Critical Care  Performed by: Dorthy Cooler, PA-C Authorized by: Dorthy Cooler, PA-C   Critical care provider statement:  Critical care time (minutes):  30   Critical care was necessary to treat or prevent imminent or life-threatening deterioration of the following conditions: hypokalemia.   Critical care was time spent personally by me on the following activities:  Development of treatment plan with patient or surrogate, discussions with consultants, evaluation of patient's response to treatment, examination of patient, ordering and review of laboratory studies, ordering and review of radiographic studies, ordering and performing treatments and interventions, pulse oximetry, re-evaluation of patient's condition and review of old charts   I assumed direction of critical care for this patient from another provider in my specialty: yes     Care discussed with: admitting provider       Medications Ordered in ED Medications  potassium chloride 10 mEq in 100 mL IVPB (10 mEq Intravenous New Bag/Given 05/24/23 2012)  potassium chloride SA (KLOR-CON M) CR tablet 40 mEq (has no administration in time range)  magnesium sulfate IVPB 2 g 50 mL (2 g Intravenous New Bag/Given 05/24/23 2015)  ondansetron (ZOFRAN) injection 4 mg (4 mg Intravenous Given 05/24/23 1740)  iohexol (OMNIPAQUE) 300 MG/ML solution 100 mL (100 mLs Intravenous Contrast Given 05/24/23 1943)    ED Course/ Medical Decision Making/ A&P                                 Medical Decision Making Amount and/or Complexity of Data Reviewed Labs: ordered. Radiology: ordered.  Risk Prescription drug management. Decision regarding hospitalization.   This patient presents to the ED for concern of chills, productive cough, nausea, vomiting, diarrhea, anorexia, this involves an  extensive number of treatment options, and is a complaint that carries with it a high risk of complications and morbidity.  The differential diagnosis includes pancreatitis, cholecystitis, crohn's exacerbation, gastroenteritis, UTI, colitis, viral illness, sepsis   Co morbidities that complicate the patient evaluation   HTN, chronic pain syndrome, Crohn's disease   Additional history obtained:  PCP [facility]   Lab Tests:  I Ordered, and personally interpreted labs.  The pertinent results include:   - CBC: no leukocytosis; mild anemia at 11.9 - CMP: hypokalemia at 2.2; AST 78; ALP 144 - Mag: 1.3 - Phosphorus: 2.0 - Lipase: within normal limits - Resp panel: negative   Imaging Studies ordered:  I ordered imaging studies including  - chest xray: to assess for process contributing to patient's symptoms - CT abd/pelv: to assess for Crohn's exacerbation I independently visualized and interpreted imaging  I agree with the radiologist interpretation   Cardiac Monitoring: / EKG:  The patient was maintained on a cardiac monitor.  I personally viewed and interpreted the cardiac monitored which showed an underlying rhythm of: sinus rhythm with QT prolongation and mild U waves   Problem List / ED Course / Critical interventions / Medication management  Admitting patient for hypokalemia PMHx HTN and Crohn's disease Presents to ED concerned for chills, productive cough, nausea, vomiting, diarrhea, anorexia x7 days. Physical exam with some mild tenderness to palpation of abdomen - otherwise unremarkable. Low grade fever 99.4 but otherwise vitals have been stable.  Chest x-ray negative.  Respiratory panel negative.  Lipase within normal limits.  CBC without leukocytosis.  CMP with hypokalemia 2.2.  Mag low at 1.3.  Phosphorus low at 2.0. EKG with QT prolongation and mild U-waves. Ordering CT abdomen/pelvis to further assess for Crohn's exacerbation. Started patient on  magnesium and potassium supplementation here in the ED.  Gave patient 1 dose of Zofran; however, given that her EKG resulted with QT prolongation we will withhold further zofran. Dr. Gretel Acre admitting physician I have reviewed the patients home medicines and have made adjustments as needed   Social Determinants of Health:  none          Final Clinical Impression(s) / ED Diagnoses Final diagnoses:  Hypokalemia  Hypomagnesemia  Hypophosphatemia  Nausea vomiting and diarrhea  Acute cough    Rx / DC Orders ED Discharge Orders     None            Margarita Rana 05/24/23 2017    Laurence Spates, MD 05/25/23 [phone]

## 2023-05-24 NOTE — H&P (Signed)
History and Physical    Patient: [name] GLO:[id number] DOB: April 25, 1971 DOA: 05/24/2023 DOS: the patient was seen and examined on 05/24/2023 PCP: [facility]  Patient coming from: Home  Chief Complaint:  Chief Complaint  Patient presents with   URI   HPI: [name] is a 52 y.o. 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   Has been having symptoms for the past week. Had chills. Unable to eat much. No abdominal pain. No dysuria, increase frequency or urgency. Has hx of Crohn's but not on medications. Reports no issues with Crohn's since her 30s. No recent antibiotics. Feeling very weak and not even to get into bathtub to bath herself. Today she noted left sided sharp chest pain that radiate across her anterior chest. Had numbness to left arm and tingling around her lips prompting her to come to ED.  Also has noticed increase redness and pain to DIP joint of right index finger and MCP joint of the right thumb. States this is the second time it has happened. Last time was a year ago. Never been diagnosed with gout.   Denies any alcohol or illicit drug use. She is on Suboxone and states she has been prescribed 8mg  but only wants to take 4mg  daily. Continue to smoke about 7 cigarettes a day and has cut back from 2 packs daily.   In the ED, she was afebrile, BP of 120/104 on room air. No leukocytosis. Mild anemia with hgb of 11.9.   Severe hypokalemia of 2.2. Mg of 1.3. Phosphorous of 2. Creatinine normal at 0.83. CBG of 116. AST elevated at 76, alkaline phosphatase of 144, T.bilirubin of 1.4. Lipase within normal limits.  Negative COVID/Flu/RSV.  EKG not release on Epic but per ED PA showed prolonged QT.  Review of Systems: As mentioned in the history of present illness. All other systems reviewed and are negative. Past Medical  History:  Diagnosis Date   Alcohol dependence in sustained full remission    Benign essential hypertension 04/09/2021   Chronic pain syndrome 04/09/2021   Crohn's disease    Dilated cbd, acquired 04/09/2021   Gallstone pancreatitis 04/09/2021   Generalized anxiety disorder    Gout 04/09/2021   History of heroin use    Kidney stones    Major depressive disorder    Obstructive sleep apnea    Past Surgical History:  Procedure Laterality Date   LEEP     LITHOTRIPSY     TUBAL LIGATION     Social History:  reports that she has been smoking cigarettes. She has never used smokeless tobacco. She reports current alcohol use of about 4.0 - 5.0 standard drinks of alcohol per week. She reports that she does not currently use drugs.  Allergies  Allergen Reactions   Penicillins Hives    Did it involve swelling of the face/tongue/throat, SOB, or low BP? No Did it involve sudden or severe rash/hives, skin peeling, or any reaction on the inside of your mouth or nose? Yes Did you need to seek medical attention at a hospital or doctor's office? Unknown When did it last happen?    childhood   If all above answers are "NO", may proceed with cephalosporin use.      Family History  Problem Relation Age of Onset   Thyroid disease Mother    Dementia Paternal Grandmother  Prior to Admission medications   Medication Sig Start Date End Date Taking? Authorizing Provider  albuterol (VENTOLIN HFA) 108 (90 Base) MCG/ACT inhaler Inhale 2 puffs into the lungs 2 (two) times daily. 04/18/23     amLODipine (NORVASC) 5 MG tablet Take 0.5 tablets (2.5 mg total) by mouth daily. Patient not taking: Reported on 01/20/2023 09/20/22   Alwyn Ren, MD  amLODipine (NORVASC) 5 MG tablet Take 1 tablet (5 mg total) by mouth daily. 03/01/23     amLODipine (NORVASC) 5 MG tablet Take 1 tablet by mouth daily 03/13/23     amLODipine (NORVASC) 5 MG tablet Take 1 tablet (5 mg total) by mouth daily. 03/13/23      aspirin EC 81 MG tablet Take 1 tablet (81 mg total) by mouth daily. Swallow whole. Patient not taking: Reported on 01/20/2023 09/19/22   Alwyn Ren, MD  buprenorphine (SUBUTEX) 8 MG SUBL SL tablet Place 1/2 tablets (4 mg total) under the tongue 2 (two) times daily. 03/29/23     buprenorphine (SUBUTEX) 8 MG SUBL SL tablet Place 0.5 tablets (4 mg total) under the tongue 2 (two) times daily. 05/05/23     Cholecalciferol ([employer] VITAMIN D3) 250 MCG (10000 UT) CAPS Take 10,000 Units by mouth every other day.    [provider]  ciprofloxacin (CIPRO) 500 MG tablet Take 1 tablet (500 mg total) by mouth 2 (two) times daily. 01/22/23   Marinda Elk, MD  lisinopril-hydrochlorothiazide (ZESTORETIC) 20-25 MG tablet Take 1 tablet by mouth daily Patient not taking: Reported on 01/20/2023 11/02/22     naloxone (NARCAN) nasal spray 4 mg/0.1 mL Inhale 1 spray by nasal route as needed, max daily dose: 4 Applicator, IF FOUND UNRESPONSIVE THEN SPRAY INTO NOSE AND CALL 911 IMMEDIATELY 01/31/23     pravastatin (PRAVACHOL) 20 MG tablet Take 1 tablet (20 mg total) by mouth daily. 12/09/22     pravastatin (PRAVACHOL) 20 MG tablet Take 1 tablet (20 mg total) by mouth daily. 03/13/23     traMADol (ULTRAM) 50 MG tablet Take 1-2 tablets (50-100 mg total) by mouth every 8 (eight) hours as needed. 03/01/23     traMADol (ULTRAM) 50 MG tablet Take 1-2 tablets (50-100 mg total) by mouth every 8 (eight) hours as needed. 04/02/23     traMADol (ULTRAM) 50 MG tablet Take 1-2 tablets (50-100 mg total) by mouth every 8 (eight) hours as needed. 05/05/23       Physical Exam: Vitals:   05/24/23 1308 05/24/23 1711 05/24/23 1845  BP: (!) 120/104 125/89   Pulse: 88 75   Resp: 16 18   Temp: 98.9 F (37.2 C) 98.4 F (36.9 C) 99.4 F (37.4 C)  TempSrc: Oral Oral Oral  SpO2: 98% 100%   Weight: 51.3 kg    Height: 5\' 6"  (1.676 m)     Constitutional: NAD, calm, comfortable, chronically ill appearing female laying in hallway  bed  Eyes: PERRL, lids and conjunctivae normal ENMT: Mucous membranes are moist.  Neck: normal, supple Respiratory: clear to auscultation bilaterally, no wheezing, no crackles. Normal respiratory effort. No accessory muscle use.  Cardiovascular: Regular rate and rhythm, no murmurs / rubs / gallops. No extremity edema. 2+ pedal pulses. No carotid bruits.  Abdomen: no tenderness, soft, no rebound tenderness, guarding or rigidity. Bowel sounds positive.  Musculoskeletal: no clubbing / cyanosis. Erythema and joint deformity of PIP joint of right index finger and right MCP joint of thumb.   Skin: no rashes, lesions, ulcers. No induration  Neurologic: CN 2-12 grossly intact.  Psychiatric: Normal judgment and insight. Alert and oriented x 3. Normal mood.   Data Reviewed:  Paper EKG copy was reviewed- in NSR, prolonged Qtc of 508, No ST or T wave changes.   Assessment and Plan: * Hypokalemia -replete with IV and oral potassium  Nausea vomiting and diarrhea -possibly viral.  -no urinary symptoms  -No findings on CT Ab/pelvis -Lipase within normal limits -check UDS  -Imodium PRN  -keep on continuous IV fluids -advance diet as tolerated  Hypomagnesemia -administered IV Mg  History of heroin use -pt on Suboxone. Has been verified to be receiving 8mg  daily prescriptions. However pt states she only wants 4mg  daily here  Chest pain -along with numbness of left arm and tingling of lips. Likely from electrolyte derangement.  -will check stat Troponin  Abnormal LFTs -Likely due to fatty infiltrate of the liver -follow repeat CMP in the morning  Acquired deformity of joint of finger of right hand -Erythema and joint deformity to DIP joint of right index finger and MCP joint of the right thumb. Suspect chronic gouty arthritis.  -obtain right hand X-ray, uric acid  Tobacco use -nicotine patch  Prolonged QT interval -Likely secondary to hypokalemia and hypomagnesia. Will replete and  repeat EKG in the morning.  Hypophosphatemia -replete with oral phosphate  Crohn's disease -No acute inflammatory changes noted on CT abd/pelvis      Advance Care Planning:Full  Consults: none  Family Communication: none at bedside  Severity of Illness: The appropriate patient status for this patient is OBSERVATION. Observation status is judged to be reasonable and necessary in order to provide the required intensity of service to ensure the patient's safety. The patient's presenting symptoms, physical exam findings, and initial radiographic and laboratory data in the context of their medical condition is felt to place them at decreased risk for further clinical deterioration. Furthermore, it is anticipated that the patient will be medically stable for discharge from the hospital within 2 midnights of admission.   Author: Anselm Jungling, DO 05/24/2023 9:52 PM  For on call review [email or web address].

## 2023-05-24 NOTE — Assessment & Plan Note (Signed)
-

## 2023-05-25 DIAGNOSIS — F17210 Nicotine dependence, cigarettes, uncomplicated: Secondary | ICD-10-CM | POA: Diagnosis present

## 2023-05-25 DIAGNOSIS — R197 Diarrhea, unspecified: Secondary | ICD-10-CM | POA: Diagnosis not present

## 2023-05-25 DIAGNOSIS — M109 Gout, unspecified: Secondary | ICD-10-CM | POA: Diagnosis present

## 2023-05-25 DIAGNOSIS — G4733 Obstructive sleep apnea (adult) (pediatric): Secondary | ICD-10-CM | POA: Diagnosis present

## 2023-05-25 DIAGNOSIS — F411 Generalized anxiety disorder: Secondary | ICD-10-CM | POA: Diagnosis present

## 2023-05-25 DIAGNOSIS — D649 Anemia, unspecified: Secondary | ICD-10-CM | POA: Diagnosis present

## 2023-05-25 DIAGNOSIS — M154 Erosive (osteo)arthritis: Secondary | ICD-10-CM | POA: Diagnosis present

## 2023-05-25 DIAGNOSIS — R7989 Other specified abnormal findings of blood chemistry: Secondary | ICD-10-CM | POA: Diagnosis present

## 2023-05-25 DIAGNOSIS — Z880 Allergy status to penicillin: Secondary | ICD-10-CM | POA: Diagnosis not present

## 2023-05-25 DIAGNOSIS — F32A Depression, unspecified: Secondary | ICD-10-CM | POA: Diagnosis present

## 2023-05-25 DIAGNOSIS — R9431 Abnormal electrocardiogram [ECG] [EKG]: Secondary | ICD-10-CM | POA: Diagnosis present

## 2023-05-25 DIAGNOSIS — K5090 Crohn's disease, unspecified, without complications: Secondary | ICD-10-CM | POA: Diagnosis present

## 2023-05-25 DIAGNOSIS — I10 Essential (primary) hypertension: Secondary | ICD-10-CM | POA: Diagnosis present

## 2023-05-25 DIAGNOSIS — G894 Chronic pain syndrome: Secondary | ICD-10-CM | POA: Diagnosis present

## 2023-05-25 DIAGNOSIS — K7581 Nonalcoholic steatohepatitis (NASH): Secondary | ICD-10-CM | POA: Diagnosis present

## 2023-05-25 DIAGNOSIS — Z8349 Family history of other endocrine, nutritional and metabolic diseases: Secondary | ICD-10-CM | POA: Diagnosis not present

## 2023-05-25 DIAGNOSIS — R112 Nausea with vomiting, unspecified: Secondary | ICD-10-CM | POA: Diagnosis present

## 2023-05-25 DIAGNOSIS — R079 Chest pain, unspecified: Secondary | ICD-10-CM | POA: Diagnosis present

## 2023-05-25 DIAGNOSIS — F1021 Alcohol dependence, in remission: Secondary | ICD-10-CM | POA: Diagnosis present

## 2023-05-25 DIAGNOSIS — E876 Hypokalemia: Secondary | ICD-10-CM | POA: Diagnosis present

## 2023-05-25 DIAGNOSIS — R202 Paresthesia of skin: Secondary | ICD-10-CM | POA: Diagnosis present

## 2023-05-25 DIAGNOSIS — Z1152 Encounter for screening for COVID-19: Secondary | ICD-10-CM | POA: Diagnosis not present

## 2023-05-25 DIAGNOSIS — R200 Anesthesia of skin: Secondary | ICD-10-CM | POA: Diagnosis present

## 2023-05-25 LAB — COMPREHENSIVE METABOLIC PANEL
ALT: 19 U/L (ref 0–44)
AST: 55 U/L — ABNORMAL HIGH (ref 15–41)
Albumin: 2.3 g/dL — ABNORMAL LOW (ref 3.5–5.0)
Alkaline Phosphatase: 104 U/L (ref 38–126)
Anion gap: 8 (ref 5–15)
BUN: 6 mg/dL (ref 6–20)
CO2: 25 mmol/L (ref 22–32)
Calcium: 6.8 mg/dL — ABNORMAL LOW (ref 8.9–10.3)
Chloride: 102 mmol/L (ref 98–111)
Creatinine, Ser: 0.65 mg/dL (ref 0.44–1.00)
GFR, Estimated: >60 mL/min (ref >60)
Glucose, Bld: 107 mg/dL — ABNORMAL HIGH (ref 70–99)
Potassium: 2.5 mmol/L — CL (ref 3.5–5.1)
Sodium: 135 mmol/L (ref 135–145)
Total Bilirubin: 0.6 mg/dL (ref 0.3–1.2)
Total Protein: 4.8 g/dL — ABNORMAL LOW (ref 6.5–8.1)

## 2023-05-25 LAB — CBC
HCT: 28.6 % — ABNORMAL LOW (ref 36.0–46.0)
Hemoglobin: 9.8 g/dL — ABNORMAL LOW (ref 12.0–15.0)
MCH: 36.4 pg — ABNORMAL HIGH (ref 26.0–34.0)
MCHC: 34.3 g/dL (ref 30.0–36.0)
MCV: 106.3 fL — ABNORMAL HIGH (ref 80.0–100.0)
Platelets: 239 10*3/uL (ref 150–400)
RBC: 2.69 MIL/uL — ABNORMAL LOW (ref 3.87–5.11)
RDW: 14.2 % (ref 11.5–15.5)
WBC: 4.8 10*3/uL (ref 4.0–10.5)
nRBC: 0 % (ref 0.0–0.2)

## 2023-05-25 LAB — MAGNESIUM
Magnesium: 1.6 mg/dL — ABNORMAL LOW (ref 1.7–2.4)
Magnesium: 1.7 mg/dL (ref 1.7–2.4)

## 2023-05-25 MED ORDER — SODIUM CHLORIDE 0.9 % IV SOLN: INTRAVENOUS | Status: DC

## 2023-05-25 MED ORDER — ENSURE ENLIVE PO LIQD
237.0000 mL | Freq: Two times a day (BID) | ORAL | Status: DC
  Administered 2023-05-25 – 2023-05-29 (×2): 237 mL via ORAL

## 2023-05-25 MED ORDER — MAGNESIUM SULFATE 2 GM/50ML IV SOLN
2.0000 g | Freq: Once | INTRAVENOUS | Status: AC
  Administered 2023-05-25: 2 g via INTRAVENOUS
  Filled 2023-05-25: qty 50

## 2023-05-25 MED ORDER — POTASSIUM CHLORIDE 10 MEQ/100ML IV SOLN
10.0000 meq | INTRAVENOUS | Status: AC
  Administered 2023-05-25 (×2): 10 meq via INTRAVENOUS
  Filled 2023-05-25 (×2): qty 100

## 2023-05-25 MED ORDER — ACETAMINOPHEN 325 MG PO TABS
650.0000 mg | ORAL_TABLET | Freq: Four times a day (QID) | ORAL | Status: DC | PRN
  Administered 2023-05-25 – 2023-05-27 (×4): 650 mg via ORAL
  Filled 2023-05-25 (×4): qty 2

## 2023-05-25 MED ORDER — POTASSIUM CHLORIDE CRYS ER 20 MEQ PO TBCR
40.0000 meq | EXTENDED_RELEASE_TABLET | Freq: Two times a day (BID) | ORAL | Status: AC
  Administered 2023-05-25 (×2): 40 meq via ORAL
  Filled 2023-05-25 (×2): qty 2

## 2023-05-25 MED ORDER — COLCHICINE 0.6 MG PO TABS
1.2000 mg | ORAL_TABLET | Freq: Once | ORAL | Status: AC
  Administered 2023-05-25: 1.2 mg via ORAL
  Filled 2023-05-25: qty 2

## 2023-05-25 MED ORDER — COLCHICINE 0.6 MG PO TABS
0.6000 mg | ORAL_TABLET | Freq: Once | ORAL | Status: AC
  Administered 2023-05-25: 0.6 mg via ORAL
  Filled 2023-05-25: qty 1

## 2023-05-25 NOTE — TOC Initial Note (Signed)
Transition of Care [facility]) - Initial/Assessment Note    Patient Details  Name: [name] MRN: [id number] Date of Birth: 22-Oct-1970  [facility] CM/SW Contact:    Adrian Prows, RN Phone Number: 05/25/2023, 10:51 AM  Clinical Narrative:                 [facility] consult for SDOH risks; pt says she is from home w/ S.O.; she plans to return at d/c; her POC is Benard Halsted (S.O.) [phone]; pt says she needs assist w/ transport to MD appts; pt says she was previously approved for transportation but it ws only for 2 months; she denies IPV; pt says she receives food stamps, but she worries about food; she also says she needs assistance paying for utilities and housing; pt says she has upper partial denture, and reading glasses; she has a shower chair; pt says she does not have HH services, or home oxygen; pt agrees to receive resources for social services, financial assistance, and transportation; resources placed in d/c instructions; copies also given to pt; she will contact agency of choice; TOC will follow.  Expected Discharge Plan: Home/Self Care Barriers to Discharge: Continued Medical Work up   Patient Goals and CMS Choice Patient states their goals for this hospitalization and ongoing recovery are:: home CMS Medicare.gov Compare Post Acute Care list provided to:: Patient        Expected Discharge Plan and Services   Discharge Planning Services: CM Consult   Living arrangements for the past 2 months: Single Family Home                                      Prior Living Arrangements/Services Living arrangements for the past 2 months: Single Family Home Lives with:: Significant Other Patient language and need for interpreter reviewed:: Yes Do you feel safe going back to the place where you live?: Yes      Need for Family Participation in Patient Care: Yes (Comment) Care giver support system in place?: Yes (comment) Current home services: DME  (shower chair) Criminal Activity/Legal Involvement Pertinent to Current Situation/Hospitalization: No - Comment as needed  Activities of Daily Living   ADL Screening (condition at time of admission) Independently performs ADLs?: No Does the patient have a NEW difficulty with bathing/dressing/toileting/self-feeding that is expected to last >3 days?: No Does the patient have a NEW difficulty with getting in/out of bed, walking, or climbing stairs that is expected to last >3 days?: No Does the patient have a NEW difficulty with communication that is expected to last >3 days?: No Is the patient deaf or have difficulty hearing?: No Does the patient have difficulty seeing, even when wearing glasses/contacts?: No Does the patient have difficulty concentrating, remembering, or making decisions?: Yes  Permission Sought/Granted Permission sought to share information with : Case Manager Permission granted to share information with : Yes, Verbal Permission Granted  Share Information with NAME: Case Manager     Permission granted to share info w Relationship: Benard Halsted (S.O.) [phone]     Emotional Assessment Appearance:: Appears stated age Attitude/Demeanor/Rapport: Gracious Affect (typically observed): Accepting Orientation: : Oriented to Self, Oriented to Place, Oriented to  Time, Oriented to Situation Alcohol / Substance Use: Not Applicable Psych Involvement: No (comment)  Admission diagnosis:  Hypokalemia [E87.6] Hypomagnesemia [E83.42] Hypophosphatemia [E83.39] Nausea vomiting and diarrhea [R11.2, R19.7] Acute cough [R05.1] Patient Active Problem List  Diagnosis Date Noted   Hypophosphatemia 05/24/2023   Prolonged QT interval 05/24/2023   Tobacco use 05/24/2023   Acquired deformity of joint of finger of right hand 05/24/2023   Abnormal LFTs 05/24/2023   Chest pain 05/24/2023   Hypomagnesemia 01/21/2023   UTI (urinary tract infection) 01/21/2023   Intractable nausea and  vomiting 01/20/2023   Malnutrition of moderate degree 09/16/2022   Acute metabolic encephalopathy 09/14/2022   Nausea vomiting and diarrhea 09/14/2022   Elevated troponin 09/13/2022   Elevated troponin I level 09/12/2022   Alcohol use 09/12/2022   AKI (acute kidney injury) (HCC) 09/12/2022   Hypokalemia 09/12/2022   Opiate withdrawal (HCC) - possible 09/12/2022   Cellulitis of right index finger 09/12/2022   Major depressive disorder    Generalized anxiety disorder    History of heroin use    Crohn's disease    Obstructive sleep apnea    Gout 04/09/2021   Dilated cbd, acquired 04/09/2021   Chronic pain syndrome 04/09/2021   Benign essential hypertension 04/09/2021   Alcohol dependence in sustained full remission    PCP:  [facility]:   [facility] [address] [place] [address] Phone: [phone] Fax: [phone]     Social Determinants of Health (SDOH) Social History: SDOH Screenings   Food Insecurity: Food Insecurity Present (05/25/2023)  Housing: Medium Risk (05/25/2023)  Transportation Needs: Unmet Transportation Needs (05/25/2023)  Utilities: At Risk (05/25/2023)  Alcohol Screen: Low Risk  (04/22/2021)  Depression (PHQ2-9): Low Risk  (01/04/2022)  Tobacco Use: High Risk (05/24/2023)   SDOH Interventions: Food Insecurity Interventions: Inpatient TOC, Other (Comment) (resources given) Housing Interventions: Intervention Not Indicated, Inpatient TOC, Other (Comment) (resources provided) Transportation Interventions: Inpatient TOC, Other (Comment) (resources provided) Utilities Interventions: Inpatient TOC, Other (Comment) (resources provided)   Readmission Risk Interventions     No data to display

## 2023-05-25 NOTE — Progress Notes (Signed)
PROGRESS NOTE    [name]  ZOX:[id number] DOB: 02-09-1971 DOA: 05/24/2023 PCP: [facility]     Brief Narrative:  [name] is a 52 y.o. 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   New events last 24 hours / Subjective: She admits to symptoms for the past couple of months.  No new medications or sick contacts.  Her partner at home has not been ill.  Has been having difficult time keeping down food and liquids.  Has been trying to focus on drinking water and eating vegetables at home.  Feeling weak overall.  Assessment & Plan:   Principal Problem:   Hypokalemia Active Problems:   Nausea vomiting and diarrhea   Hypomagnesemia   History of heroin use   Crohn's disease   Hypophosphatemia   Prolonged QT interval   Tobacco use   Acquired deformity of joint of finger of right hand   Abnormal LFTs   Chest pain   Nausea, vomiting, diarrhea -CT abdomen pelvis no acute findings -Lipase 26 -GI PCR panel pending -Supportive care, IV fluid  Hypokalemia -Replace  Hypomagnesemia -Replace  History of heroin abuse -Now on Suboxone  Gout right index finger -Uric acid 13.6 -Colchicine   Crohn's disease -No acute inflammation seen on CT abdomen pelvis  Tobacco abuse -Nicotine patch  Prolonged Qtc -Repeat EKG today   DVT prophylaxis:  enoxaparin (LOVENOX) injection 40 mg Start: 05/25/23 0800  Code Status: Full code Family Communication: None at bedside Disposition Plan: Home Status is: Observation The patient will require care spanning > 2 midnights and should be moved to inpatient because: Supportive care, IV fluid, electrolyte abnormalities    Antimicrobials:  Anti-infectives (From admission, onward)    None        Objective: Vitals:   05/24/23 2250 05/25/23 0013 05/25/23 0526 05/25/23 0832   BP:  (!) 165/96 (!) 157/91 (!) 175/107  Pulse:  74 64 70  Resp:  14 18 17   Temp: 98.1 F (36.7 C) 98.5 F (36.9 C) 97.9 F (36.6 C) 98.4 F (36.9 C)  TempSrc: Oral Oral Oral Oral  SpO2:  93% 95% 97%  Weight:      Height:        Intake/Output Summary (Last 24 hours) at 05/25/2023 1126 Last data filed at 05/25/2023 0950 Gross per 24 hour  Intake 1532.84 ml  Output 500 ml  Net 1032.84 ml   Filed Weights   05/24/23 1308  Weight: 51.3 kg    Examination:  General exam: Appears calm and comfortable  Respiratory system: Clear to auscultation. Respiratory effort normal. No respiratory distress. No conversational dyspnea.  Cardiovascular system: S1 & S2 heard, RRR. No murmurs. No pedal edema. Gastrointestinal system: Abdomen is nondistended, soft and nontender. Normal bowel sounds heard. Central nervous system: Alert and oriented. No focal neurological deficits. Speech clear.  Extremities: Right index finger DIP joint inflamed, erythematous Psychiatry: Judgement and insight appear normal. Mood & affect appropriate.   Data Reviewed: I have personally reviewed following labs and imaging studies  CBC: Recent Labs  Lab 05/24/23 1736 05/25/23 0444  WBC 7.8 4.8  NEUTROABS 5.9  --   HGB 11.9* 9.8*  HCT 34.1* 28.6*  MCV 102.4* 106.3*  PLT 290 239   Basic Metabolic Panel: Recent Labs  Lab 05/24/23 1736 05/25/23 0444  NA 137 135  K 2.2* 2.5*  CL 95*  102  CO2 27 25  GLUCOSE 116* 107*  BUN 8 6  CREATININE 0.83 0.65  CALCIUM 8.0* 6.8*  MG 1.3* 1.6*  PHOS 2.0*  --    GFR: Estimated Creatinine Clearance: 65.9 mL/min (by C-G formula based on SCr of 0.65 mg/dL). Liver Function Tests: Recent Labs  Lab 05/24/23 1736 05/25/23 0444  AST 76* 55*  ALT 26 19  ALKPHOS 144* 104  BILITOT 1.4* 0.6  PROT 6.5 4.8*  ALBUMIN 3.2* 2.3*   Recent Labs  Lab 05/24/23 1736  LIPASE 26   No results for input(s): "AMMONIA" in the last 168 hours. Coagulation Profile: No results for  input(s): "INR", "PROTIME" in the last 168 hours. Cardiac Enzymes: No results for input(s): "CKTOTAL", "CKMB", "CKMBINDEX", "TROPONINI" in the last 168 hours. BNP (last 3 results) No results for input(s): "PROBNP" in the last 8760 hours. HbA1C: No results for input(s): "HGBA1C" in the last 72 hours. CBG: No results for input(s): "GLUCAP" in the last 168 hours. Lipid Profile: No results for input(s): "CHOL", "HDL", "LDLCALC", "TRIG", "CHOLHDL", "LDLDIRECT" in the last 72 hours. Thyroid Function Tests: No results for input(s): "TSH", "T4TOTAL", "FREET4", "T3FREE", "THYROIDAB" in the last 72 hours. Anemia Panel: No results for input(s): "VITAMINB12", "FOLATE", "FERRITIN", "TIBC", "IRON", "RETICCTPCT" in the last 72 hours. Sepsis Labs: No results for input(s): "PROCALCITON", "LATICACIDVEN" in the last 168 hours.  Recent Results (from the past 240 hour(s))  Resp panel by RT-PCR (RSV, Flu A&B, Covid) Anterior Nasal Swab     Status: None   Collection Time: 05/24/23  1:19 PM   Specimen: Anterior Nasal Swab  Result Value Ref Range Status   SARS Coronavirus 2 by RT PCR NEGATIVE NEGATIVE Final    Comment: (NOTE) SARS-CoV-2 target nucleic acids are NOT DETECTED.  The SARS-CoV-2 RNA is generally detectable in upper respiratory specimens during the acute phase of infection. The lowest concentration of SARS-CoV-2 viral copies this assay can detect is 138 copies/mL. A negative result does not preclude SARS-Cov-2 infection and should not be used as the sole basis for treatment or other patient management decisions. A negative result may occur with  improper specimen collection/handling, submission of specimen other than nasopharyngeal swab, presence of viral mutation(s) within the areas targeted by this assay, and inadequate number of viral copies(<138 copies/mL). A negative result must be combined with clinical observations, patient history, and epidemiological information. The expected result  is Negative.  Fact Sheet for Patients:  BloggerCourse.com  Fact Sheet for Healthcare Providers:  SeriousBroker.it  This test is no t yet approved or cleared by the [place] FDA and  has been authorized for detection and/or diagnosis of SARS-CoV-2 by FDA under an Emergency Use Authorization (EUA). This EUA will remain  in effect (meaning this test can be used) for the duration of the COVID-19 declaration under Section 564(b)(1) of the Act, 21 U.S.C.section 360bbb-3(b)(1), unless the authorization is terminated  or revoked sooner.       Influenza A by PCR NEGATIVE NEGATIVE Final   Influenza B by PCR NEGATIVE NEGATIVE Final    Comment: (NOTE) The Xpert Xpress SARS-CoV-2/FLU/RSV plus assay is intended as an aid in the diagnosis of influenza from Nasopharyngeal swab specimens and should not be used as a sole basis for treatment. Nasal washings and aspirates are unacceptable for Xpert Xpress SARS-CoV-2/FLU/RSV testing.  Fact Sheet for Patients: BloggerCourse.com  Fact Sheet for Healthcare Providers: SeriousBroker.it  This test is not yet approved or cleared by the [place] FDA and has been  authorized for detection and/or diagnosis of SARS-CoV-2 by FDA under an Emergency Use Authorization (EUA). This EUA will remain in effect (meaning this test can be used) for the duration of the COVID-19 declaration under Section 564(b)(1) of the Act, 21 U.S.C. section 360bbb-3(b)(1), unless the authorization is terminated or revoked.     Resp Syncytial Virus by PCR NEGATIVE NEGATIVE Final    Comment: (NOTE) Fact Sheet for Patients: BloggerCourse.com  Fact Sheet for Healthcare Providers: SeriousBroker.it  This test is not yet approved or cleared by the [place] FDA and has been authorized for detection and/or diagnosis of  SARS-CoV-2 by FDA under an Emergency Use Authorization (EUA). This EUA will remain in effect (meaning this test can be used) for the duration of the COVID-19 declaration under Section 564(b)(1) of the Act, 21 U.S.C. section 360bbb-3(b)(1), unless the authorization is terminated or revoked.  Performed at [facility], [address], [place], [place] [address]       Radiology Studies: DG Hand 2 View Right  Result Date: 05/24/2023 CLINICAL DATA:  Pain, redness, swelling in right index finger EXAM: RIGHT HAND - 2 VIEW COMPARISON:  None Available. FINDINGS: Advanced osteoarthritis at the right index finger DIP joint. Erosions are noted suggesting erosive arthritis. Overlying soft tissue swelling. No acute bony abnormality. Specifically, no fracture, subluxation, or dislocation. IMPRESSION: No acute bony abnormality. 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 Electronically Signed   By: Charlett Nose M.D.   On: 05/24/2023 22:09   CT ABDOMEN PELVIS W CONTRAST  Result Date: 05/24/2023 CLINICAL DATA:  Crohn's exacerbation, flu like symptoms. Cough, congestion, fevers EXAM: CT ABDOMEN AND PELVIS WITH CONTRAST TECHNIQUE: Multidetector CT imaging of the abdomen and pelvis was performed using the standard protocol following bolus administration of intravenous contrast. RADIATION DOSE REDUCTION: This exam was performed according to the departmental dose-optimization program which includes automated exposure control, adjustment of the mA and/or kV according to patient size and/or use of iterative reconstruction technique. CONTRAST:  OMNIPAQUE IOHEXOL 300 MG/ML  SOLN COMPARISON:  01/20/2023 FINDINGS: Lower chest: No acute abnormality. Hepatobiliary: Diffuse low-density throughout the liver compatible with fatty infiltration. No focal abnormality. Gallbladder unremarkable. Pancreas: No focal abnormality or ductal dilatation. Spleen: No focal  abnormality.  Normal size. Adrenals/Urinary Tract: Areas of cortical thinning in the kidneys bilaterally. No suspicious renal or adrenal lesion. No hydronephrosis. Urinary bladder unremarkable. Stomach/Bowel: Stomach, large and small bowel grossly unremarkable. Postoperative changes in the right abdomen. No inflammatory changes. Vascular/Lymphatic: Aortic atherosclerosis. No evidence of aneurysm or adenopathy. Reproductive: Uterus and adnexa unremarkable.  No mass. Other: Trace free fluid in the cul-de-sac.  No free air. Musculoskeletal: No acute bony abnormality. IMPRESSION: Diffuse fatty infiltration of the liver. Postoperative changes in the region of the cecum/terminal ileum. No acute findings. No active inflammatory process. Aortic atherosclerosis. Electronically Signed   By: Charlett Nose M.D.   On: 05/24/2023 20:28   DG Chest 2 View  Result Date: 05/24/2023 CLINICAL DATA:  Cough. EXAM: CHEST - 2 VIEW COMPARISON:  01/20/2023. FINDINGS: Bilateral lungs appear hyperlucent with coarse bronchovascular markings, in keeping with COPD. Bilateral lungs otherwise appear clear. No dense consolidation. Bilateral costophrenic angles are clear. Normal cardio-mediastinal silhouette. No acute osseous abnormalities. The soft tissues are within normal limits. IMPRESSION: 1. COPD. No active cardiopulmonary disease. Electronically Signed   By: Jules Schick M.D.   On: 05/24/2023 18:34      Scheduled Meds:  buprenorphine  4 mg Sublingual Daily  enoxaparin (LOVENOX) injection  40 mg Subcutaneous Q24H   feeding supplement  237 mL Oral BID BM   nicotine  14 mg Transdermal Daily   potassium & sodium phosphates  1 packet Oral TID WC & HS   potassium chloride  40 mEq Oral BID   Continuous Infusions:  sodium chloride       LOS: 0 days   Time spent: 40 minutes   Noralee Stain, DO [facility] 05/25/2023, 11:26 AM   Available via Epic secure chat 7am-7pm After these hours, please refer to coverage  provider listed on [email or web address]

## 2023-05-25 NOTE — Plan of Care (Signed)
  Problem: Clinical Measurements: Goal: Diagnostic test results will improve Outcome: Progressing Goal: Respiratory complications will improve Outcome: Progressing Goal: Cardiovascular complication will be avoided Outcome: Progressing   

## 2023-05-25 NOTE — ED Notes (Signed)
ED TO INPATIENT HANDOFF REPORT  Name/Age/Gender [name] 52 y.o. female  Code Status    Code Status Orders  (From admission, onward)           Start     Ordered   05/24/23 2115  Full code  Continuous       Question:  By:  Answer:  Consent: discussion documented in EHR   05/24/23 2115           Code Status History     Date Active Date Inactive Code Status Order ID Comments User Context   01/20/2023 1955 01/22/2023 1824 Full Code [id number]  Darlin Drop, DO ED   09/12/2022 2324 09/19/2022 2337 Full Code [id number]  Carollee Herter, DO ED   04/09/2021 2015 04/14/2021 2316 Full Code [id number]  Standley Brooking, MD Inpatient       Home/SNF/Other Home  Chief Complaint Hypokalemia [E87.6]  Level of Care/Admitting Diagnosis ED Disposition     ED Disposition  Admit   Condition  --   Comment  Hospital Area: [facility] [[id number]]  Level of Care: Telemetry [5]  Admit to tele based on following criteria: Monitor QTC interval  May place patient in observation at [facility] or [facility] if equivalent level of care is available:: No  Covid Evaluation: Asymptomatic - no recent exposure (last 10 days) testing not required  Diagnosis: Hypokalemia [172180]  Admitting Physician: Anselm Jungling [[id number]]  Attending Physician: Anselm Jungling [[id number]]          Medical History Past Medical History:  Diagnosis Date   Alcohol dependence in sustained full remission    Benign essential hypertension 04/09/2021   Chronic pain syndrome 04/09/2021   Crohn's disease    Dilated cbd, acquired 04/09/2021   Gallstone pancreatitis 04/09/2021   Generalized anxiety disorder    Gout 04/09/2021   History of heroin use    Kidney stones    Major depressive disorder    Obstructive sleep apnea     Allergies Allergies  Allergen Reactions   Penicillins Hives    Did it involve swelling of the face/tongue/throat, SOB, or low BP? No Did it involve sudden or severe  rash/hives, skin peeling, or any reaction on the inside of your mouth or nose? Yes Did you need to seek medical attention at a hospital or doctor's office? Unknown When did it last happen?    childhood   If all above answers are "NO", may proceed with cephalosporin use.      IV Location/Drains/Wounds Patient Lines/Drains/Airways Status     Active Line/Drains/Airways     Name Placement date Placement time Site Days   Peripheral IV 05/24/23 20 G Anterior;Distal;Right Forearm 05/24/23  1736  Forearm  1            Labs/Imaging Results for orders placed or performed during the hospital encounter of 05/24/23 (from the past 48 hour(s))  Resp panel by RT-PCR (RSV, Flu A&B, Covid) Anterior Nasal Swab     Status: None   Collection Time: 05/24/23  1:19 PM   Specimen: Anterior Nasal Swab  Result Value Ref Range   SARS Coronavirus 2 by RT PCR NEGATIVE NEGATIVE    Comment: (NOTE) SARS-CoV-2 target nucleic acids are NOT DETECTED.  The SARS-CoV-2 RNA is generally detectable in upper respiratory specimens during the acute phase of infection. The lowest concentration of SARS-CoV-2 viral copies this assay can detect is 138 copies/mL. A negative result does not preclude SARS-Cov-2 infection  and should not be used as the sole basis for treatment or other patient management decisions. A negative result may occur with  improper specimen collection/handling, submission of specimen other than nasopharyngeal swab, presence of viral mutation(s) within the areas targeted by this assay, and inadequate number of viral copies(<138 copies/mL). A negative result must be combined with clinical observations, patient history, and epidemiological information. The expected result is Negative.  Fact Sheet for Patients:  BloggerCourse.com  Fact Sheet for Healthcare Providers:  SeriousBroker.it  This test is no t yet approved or cleared by the [place]  FDA and  has been authorized for detection and/or diagnosis of SARS-CoV-2 by FDA under an Emergency Use Authorization (EUA). This EUA will remain  in effect (meaning this test can be used) for the duration of the COVID-19 declaration under Section 564(b)(1) of the Act, 21 U.S.C.section 360bbb-3(b)(1), unless the authorization is terminated  or revoked sooner.       Influenza A by PCR NEGATIVE NEGATIVE   Influenza B by PCR NEGATIVE NEGATIVE    Comment: (NOTE) The Xpert Xpress SARS-CoV-2/FLU/RSV plus assay is intended as an aid in the diagnosis of influenza from Nasopharyngeal swab specimens and should not be used as a sole basis for treatment. Nasal washings and aspirates are unacceptable for Xpert Xpress SARS-CoV-2/FLU/RSV testing.  Fact Sheet for Patients: BloggerCourse.com  Fact Sheet for Healthcare Providers: SeriousBroker.it  This test is not yet approved or cleared by the [place] FDA and has been authorized for detection and/or diagnosis of SARS-CoV-2 by FDA under an Emergency Use Authorization (EUA). This EUA will remain in effect (meaning this test can be used) for the duration of the COVID-19 declaration under Section 564(b)(1) of the Act, 21 U.S.C. section 360bbb-3(b)(1), unless the authorization is terminated or revoked.     Resp Syncytial Virus by PCR NEGATIVE NEGATIVE    Comment: (NOTE) Fact Sheet for Patients: BloggerCourse.com  Fact Sheet for Healthcare Providers: SeriousBroker.it  This test is not yet approved or cleared by the [place] FDA and has been authorized for detection and/or diagnosis of SARS-CoV-2 by FDA under an Emergency Use Authorization (EUA). This EUA will remain in effect (meaning this test can be used) for the duration of the COVID-19 declaration under Section 564(b)(1) of the Act, 21 U.S.C. section 360bbb-3(b)(1), unless the  authorization is terminated or revoked.  Performed at [facility], [address], [place], [place] [address]   CBC with Differential     Status: Abnormal   Collection Time: 05/24/23  5:36 PM  Result Value Ref Range   WBC 7.8 4.0 - 10.5 K/uL   RBC 3.33 (L) 3.87 - 5.11 MIL/uL   Hemoglobin 11.9 (L) 12.0 - 15.0 g/dL   HCT 27.2 (L) 53.6 - 64.4 %   MCV 102.4 (H) 80.0 - 100.0 fL   MCH 35.7 (H) 26.0 - 34.0 pg   MCHC 34.9 30.0 - 36.0 g/dL   RDW 03.4 74.2 - 59.5 %   Platelets 290 150 - 400 K/uL   nRBC 0.0 0.0 - 0.2 %   Neutrophils Relative % 76 %   Neutro Abs 5.9 1.7 - 7.7 K/uL   Lymphocytes Relative 18 %   Lymphs Abs 1.4 0.7 - 4.0 K/uL   Monocytes Relative 6 %   Monocytes Absolute 0.5 0.1 - 1.0 K/uL   Eosinophils Relative 0 %   Eosinophils Absolute 0.0 0.0 - 0.5 K/uL   Basophils Relative 0 %   Basophils Absolute 0.0 0.0 -  0.1 K/uL   Immature Granulocytes 0 %   Abs Immature Granulocytes 0.02 0.00 - 0.07 K/uL    Comment: Performed at [facility], [address], [place], [place] [address]  Comprehensive metabolic panel     Status: Abnormal   Collection Time: 05/24/23  5:36 PM  Result Value Ref Range   Sodium 137 135 - 145 mmol/L   Potassium 2.2 (LL) 3.5 - 5.1 mmol/L    Comment: CRITICAL RESULT CALLED TO, READ BACK BY AND VERIFIED WITH S.SHAW, RN AT 1826 ON 10.02.24 BY N.THOMPSON    Chloride 95 (L) 98 - 111 mmol/L   CO2 27 22 - 32 mmol/L   Glucose, Bld 116 (H) 70 - 99 mg/dL    Comment: Glucose reference range applies only to samples taken after fasting for at least 8 hours.   BUN 8 6 - 20 mg/dL   Creatinine, Ser 1.91 0.44 - 1.00 mg/dL   Calcium 8.0 (L) 8.9 - 10.3 mg/dL   Total Protein 6.5 6.5 - 8.1 g/dL   Albumin 3.2 (L) 3.5 - 5.0 g/dL   AST 76 (H) 15 - 41 U/L   ALT 26 0 - 44 U/L   Alkaline Phosphatase 144 (H) 38 - 126 U/L   Total Bilirubin 1.4 (H) 0.3 - 1.2 mg/dL   GFR, Estimated >47 >82 mL/min    Comment: (NOTE) Calculated using  the CKD-EPI Creatinine Equation (2021)    Anion gap 15 5 - 15    Comment: Performed at [facility], [address] [place], [place] [address]  Lipase, blood     Status: None   Collection Time: 05/24/23  5:36 PM  Result Value Ref Range   Lipase 26 11 - 51 U/L    Comment: Performed at [facility], [address] [place], [place] [address]  Magnesium     Status: Abnormal   Collection Time: 05/24/23  5:36 PM  Result Value Ref Range   Magnesium 1.3 (L) 1.7 - 2.4 mg/dL    Comment: Performed at [facility], [address], [place], [place] [address]  Phosphorus     Status: Abnormal   Collection Time: 05/24/23  5:36 PM  Result Value Ref Range   Phosphorus 2.0 (L) 2.5 - 4.6 mg/dL    Comment: Performed at [facility], [address], [place], [place] [address]  Uric acid     Status: Abnormal   Collection Time: 05/24/23  9:57 PM  Result Value Ref Range   Uric Acid, Serum 13.6 (H) 2.5 - 7.1 mg/dL    Comment: HEMOLYSIS AT THIS LEVEL MAY AFFECT RESULT Performed at [facility], [address] [place], [place] [address]   Troponin I (High Sensitivity)     Status: None   Collection Time: 05/24/23  9:57 PM  Result Value Ref Range   Troponin I (High Sensitivity) 15 <18 ng/L    Comment: (NOTE) Elevated high sensitivity troponin I (hsTnI) values and significant  changes across serial measurements may suggest ACS but many other  chronic and acute conditions are known to elevate hsTnI results.  Refer to the "Links" section for chest pain algorithms and additional  guidance. Performed at [facility], [address] [place], [place] [address]   Rapid urine drug screen (hospital performed)     Status: None   Collection Time: 05/24/23 11:12 PM  Result Value Ref Range   Opiates NONE DETECTED NONE DETECTED  Cocaine NONE DETECTED NONE DETECTED   Benzodiazepines NONE DETECTED NONE  DETECTED   Amphetamines NONE DETECTED NONE DETECTED   Tetrahydrocannabinol NONE DETECTED NONE DETECTED   Barbiturates NONE DETECTED NONE DETECTED    Comment: (NOTE) DRUG SCREEN FOR MEDICAL PURPOSES ONLY.  IF CONFIRMATION IS NEEDED FOR ANY PURPOSE, NOTIFY LAB WITHIN 5 DAYS.  LOWEST DETECTABLE LIMITS FOR URINE DRUG SCREEN Drug Class                     Cutoff (ng/mL) Amphetamine and metabolites    1000 Barbiturate and metabolites    200 Benzodiazepine                 200 Opiates and metabolites        300 Cocaine and metabolites        300 THC                            50 Performed at [facility], [address] [place], [place] [address]   CBC     Status: Abnormal   Collection Time: 05/25/23  4:44 AM  Result Value Ref Range   WBC 4.8 4.0 - 10.5 K/uL   RBC 2.69 (L) 3.87 - 5.11 MIL/uL   Hemoglobin 9.8 (L) 12.0 - 15.0 g/dL   HCT 60.4 (L) 54.0 - 98.1 %   MCV 106.3 (H) 80.0 - 100.0 fL   MCH 36.4 (H) 26.0 - 34.0 pg   MCHC 34.3 30.0 - 36.0 g/dL   RDW 19.1 47.8 - 29.5 %   Platelets 239 150 - 400 K/uL   nRBC 0.0 0.0 - 0.2 %    Comment: Performed at [facility], [address] [place], [place] [address]  Comprehensive metabolic panel     Status: Abnormal   Collection Time: 05/25/23  4:44 AM  Result Value Ref Range   Sodium 135 135 - 145 mmol/L   Potassium 2.5 (LL) 3.5 - 5.1 mmol/L    Comment: CRITICAL RESULT CALLED TO, READ BACK BY AND VERIFIED WITH BLACK,M RN @ [phone] 05/25/23 BY CHILDRESS,E   Chloride 102 98 - 111 mmol/L   CO2 25 22 - 32 mmol/L   Glucose, Bld 107 (H) 70 - 99 mg/dL    Comment: Glucose reference range applies only to samples taken after fasting for at least 8 hours.   BUN 6 6 - 20 mg/dL   Creatinine, Ser 8.46 0.44 - 1.00 mg/dL   Calcium 6.8 (L) 8.9 - 10.3 mg/dL   Total Protein 4.8 (L) 6.5 - 8.1 g/dL   Albumin 2.3 (L) 3.5 - 5.0 g/dL   AST 55 (H) 15 - 41 U/L   ALT 19 0 - 44 U/L   Alkaline Phosphatase 104 38 - 126 U/L    Total Bilirubin 0.6 0.3 - 1.2 mg/dL   GFR, Estimated >96 >29 mL/min    Comment: (NOTE) Calculated using the CKD-EPI Creatinine Equation (2021)    Anion gap 8 5 - 15    Comment: Performed at [facility], [address] [place], [place] [address]  Magnesium     Status: Abnormal   Collection Time: 05/25/23  4:44 AM  Result Value Ref Range   Magnesium 1.6 (L) 1.7 - 2.4 mg/dL    Comment: Performed at [facility], [address], [place], [place] [address]   DG Hand 2 View Right  Result Date: 05/24/2023  CLINICAL DATA:  Pain, redness, swelling in right index finger EXAM: RIGHT HAND - 2 VIEW COMPARISON:  None Available. FINDINGS: Advanced osteoarthritis at the right index finger DIP joint. Erosions are noted suggesting erosive arthritis. Overlying soft tissue swelling. No acute bony abnormality. Specifically, no fracture, subluxation, or dislocation. IMPRESSION: No acute bony abnormality. 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 Electronically Signed   By: Charlett Nose M.D.   On: 05/24/2023 22:09   CT ABDOMEN PELVIS W CONTRAST  Result Date: 05/24/2023 CLINICAL DATA:  Crohn's exacerbation, flu like symptoms. Cough, congestion, fevers EXAM: CT ABDOMEN AND PELVIS WITH CONTRAST TECHNIQUE: Multidetector CT imaging of the abdomen and pelvis was performed using the standard protocol following bolus administration of intravenous contrast. RADIATION DOSE REDUCTION: This exam was performed according to the departmental dose-optimization program which includes automated exposure control, adjustment of the mA and/or kV according to patient size and/or use of iterative reconstruction technique. CONTRAST:  OMNIPAQUE IOHEXOL 300 MG/ML  SOLN COMPARISON:  01/20/2023 FINDINGS: Lower chest: No acute abnormality. Hepatobiliary: Diffuse low-density throughout the liver compatible with fatty infiltration. No focal abnormality. Gallbladder  unremarkable. Pancreas: No focal abnormality or ductal dilatation. Spleen: No focal abnormality.  Normal size. Adrenals/Urinary Tract: Areas of cortical thinning in the kidneys bilaterally. No suspicious renal or adrenal lesion. No hydronephrosis. Urinary bladder unremarkable. Stomach/Bowel: Stomach, large and small bowel grossly unremarkable. Postoperative changes in the right abdomen. No inflammatory changes. Vascular/Lymphatic: Aortic atherosclerosis. No evidence of aneurysm or adenopathy. Reproductive: Uterus and adnexa unremarkable.  No mass. Other: Trace free fluid in the cul-de-sac.  No free air. Musculoskeletal: No acute bony abnormality. IMPRESSION: Diffuse fatty infiltration of the liver. Postoperative changes in the region of the cecum/terminal ileum. No acute findings. No active inflammatory process. Aortic atherosclerosis. Electronically Signed   By: Charlett Nose M.D.   On: 05/24/2023 20:28   DG Chest 2 View  Result Date: 05/24/2023 CLINICAL DATA:  Cough. EXAM: CHEST - 2 VIEW COMPARISON:  01/20/2023. FINDINGS: Bilateral lungs appear hyperlucent with coarse bronchovascular markings, in keeping with COPD. Bilateral lungs otherwise appear clear. No dense consolidation. Bilateral costophrenic angles are clear. Normal cardio-mediastinal silhouette. No acute osseous abnormalities. The soft tissues are within normal limits. IMPRESSION: 1. COPD. No active cardiopulmonary disease. Electronically Signed   By: Jules Schick M.D.   On: 05/24/2023 18:34    Pending Labs Unresulted Labs (From admission, onward)     Start     Ordered   05/25/23 2000  Magnesium  Once,   R        05/25/23 0615   05/24/23 2117  Gastrointestinal Panel by PCR , Stool  (Gastrointestinal Panel by PCR, Stool                                                                                                                                                     **  Does Not include CLOSTRIDIUM DIFFICILE testing. **If CDIFF testing is  needed, place order from the "C Difficile Testing" order set.**)  Once,   R        05/24/23 2116            Vitals/Pain Today's Vitals   05/25/23 0013 05/25/23 0015 05/25/23 0140 05/25/23 0526  BP: (!) 165/96   (!) 157/91  Pulse: 74   64  Resp: 14   18  Temp: 98.5 F (36.9 C)   97.9 F (36.6 C)  TempSrc: Oral   Oral  SpO2: 93%   95%  Weight:      Height:      PainSc:  6  3      Isolation Precautions Enteric precautions (UV disinfection)  Medications Medications  potassium & sodium phosphates (PHOS-NAK) 280-160-250 MG packet 1 packet (1 packet Oral Given 05/24/23 2203)  enoxaparin (LOVENOX) injection 40 mg (has no administration in time range)  nicotine (NICODERM CQ - dosed in mg/24 hours) patch 14 mg (14 mg Transdermal Patch Applied 05/24/23 2202)  0.9 %  sodium chloride infusion ( Intravenous New Bag/Given 05/24/23 2202)  loperamide (IMODIUM) capsule 2 mg (has no administration in time range)  LORazepam (ATIVAN) tablet 0.5 mg (has no administration in time range)  buprenorphine (SUBUTEX) SL tablet 4 mg (4 mg Sublingual Given 05/24/23 2307)  acetaminophen (TYLENOL) tablet 650 mg (650 mg Oral Given 05/25/23 0032)  potassium chloride SA (KLOR-CON M) CR tablet 40 mEq (40 mEq Oral Given 05/25/23 0621)  potassium chloride 10 mEq in 100 mL IVPB (10 mEq Intravenous New Bag/Given 05/25/23 0708)  ondansetron (ZOFRAN) injection 4 mg (4 mg Intravenous Given 05/24/23 1740)  potassium chloride 10 mEq in 100 mL IVPB (0 mEq Intravenous Stopped 05/24/23 2138)  potassium chloride SA (KLOR-CON M) CR tablet 40 mEq (40 mEq Oral Given 05/24/23 2017)  magnesium sulfate IVPB 2 g 50 mL (0 g Intravenous Stopped 05/24/23 2138)  iohexol (OMNIPAQUE) 300 MG/ML solution 100 mL (100 mLs Intravenous Contrast Given 05/24/23 1943)  sodium chloride 0.9 % bolus 1,000 mL (0 mLs Intravenous Stopped 05/25/23 0140)  magnesium sulfate IVPB 2 g 50 mL (0 g Intravenous Stopped 05/25/23 0739)    Mobility walks with person  assist

## 2023-05-26 DIAGNOSIS — R197 Diarrhea, unspecified: Secondary | ICD-10-CM | POA: Diagnosis not present

## 2023-05-26 DIAGNOSIS — R112 Nausea with vomiting, unspecified: Secondary | ICD-10-CM | POA: Diagnosis not present

## 2023-05-26 LAB — GASTROINTESTINAL PANEL BY PCR, STOOL (REPLACES STOOL CULTURE)

## 2023-05-26 LAB — BASIC METABOLIC PANEL
Anion gap: 8 (ref 5–15)
BUN: <5 mg/dL — ABNORMAL LOW (ref 6–20)
CO2: 23 mmol/L (ref 22–32)
Calcium: 7.5 mg/dL — ABNORMAL LOW (ref 8.9–10.3)
Chloride: 108 mmol/L (ref 98–111)
Creatinine, Ser: 0.67 mg/dL (ref 0.44–1.00)
GFR, Estimated: >60 mL/min (ref >60)
Glucose, Bld: 94 mg/dL (ref 70–99)
Potassium: 3.3 mmol/L — ABNORMAL LOW (ref 3.5–5.1)
Sodium: 139 mmol/L (ref 135–145)

## 2023-05-26 MED ORDER — PRAVASTATIN SODIUM 20 MG PO TABS
20.0000 mg | ORAL_TABLET | Freq: Every day | ORAL | Status: DC
  Administered 2023-05-26 – 2023-05-29 (×4): 20 mg via ORAL
  Filled 2023-05-26 (×4): qty 1

## 2023-05-26 MED ORDER — AMLODIPINE BESYLATE 5 MG PO TABS
5.0000 mg | ORAL_TABLET | Freq: Every day | ORAL | Status: DC
  Administered 2023-05-26 – 2023-05-27 (×2): 5 mg via ORAL
  Filled 2023-05-26 (×3): qty 1

## 2023-05-26 MED ORDER — ZINC OXIDE 40 % EX OINT: TOPICAL_OINTMENT | Freq: Three times a day (TID) | CUTANEOUS | Status: DC | PRN

## 2023-05-26 MED ORDER — POTASSIUM CHLORIDE CRYS ER 20 MEQ PO TBCR
30.0000 meq | EXTENDED_RELEASE_TABLET | ORAL | Status: AC
  Administered 2023-05-26 (×2): 30 meq via ORAL
  Filled 2023-05-26 (×2): qty 1

## 2023-05-26 NOTE — Plan of Care (Signed)
  Problem: Education: Goal: Knowledge of General Education information will improve Description Including pain rating scale, medication(s)/side effects and non-pharmacologic comfort measures Outcome: Progressing   Problem: Health Behavior/Discharge Planning: Goal: Ability to manage health-related needs will improve Outcome: Progressing   

## 2023-05-26 NOTE — Plan of Care (Signed)

## 2023-05-26 NOTE — Progress Notes (Signed)
PROGRESS NOTE    [name]  MVH:[id number] DOB: 06-06-1971 DOA: 05/24/2023 PCP: [facility]     Brief Narrative:  [name] is a 52 y.o. 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   New events last 24 hours / Subjective: Had about 4-5 bowel movements yesterday, about 2-3 this morning.  Remains very weak.  Assessment & Plan:   Principal Problem:   Nausea vomiting and diarrhea Active Problems:   Hypomagnesemia   History of heroin use   Crohn's disease   Hypokalemia   Hypophosphatemia   Prolonged QT interval   Tobacco use   Acquired deformity of joint of finger of right hand   Abnormal LFTs   Chest pain   Nausea, vomiting, diarrhea -CT abdomen pelvis no acute findings -Lipase 26 -GI PCR panel negative, C. difficile pending -Supportive care, IV fluid  History of heroin abuse -Now on Suboxone  Gout right index finger -Uric acid 13.6 -Colchicine   Crohn's disease -No acute inflammation seen on CT abdomen pelvis  Tobacco abuse -Nicotine patch  Prolonged Qtc -Repeat EKG reviewed independently today.  Normal sinus rhythm with QTc 481  Hypertension -Norvasc added  Hypokalemia -Replace  DVT prophylaxis:  enoxaparin (LOVENOX) injection 40 mg Start: 05/25/23 0800  Code Status: Full code Family Communication: None at bedside Disposition Plan: Home Status is: Inpatient Remains inpatient appropriate because: IV fluid, supportive care      Antimicrobials:  Anti-infectives (From admission, onward)    None        Objective: Vitals:   05/25/23 2155 05/26/23 0518 05/26/23 0610 05/26/23 1111  BP: (!) 153/107 (!) 172/126 (!) 172/96 (!) 176/109  Pulse: 73 72 68 72  Resp: 18 18    Temp: 98.8 F (37.1 C) 98.3 F (36.8 C)  97.9 F (36.6 C)  TempSrc: Oral Oral  Oral  SpO2: 98% 96%  96%   Weight:      Height:        Intake/Output Summary (Last 24 hours) at 05/26/2023 1228 Last data filed at 05/26/2023 1104 Gross per 24 hour  Intake 3121.5 ml  Output 1350 ml  Net 1771.5 ml   Filed Weights   05/24/23 1308  Weight: 51.3 kg    Examination:  General exam: Appears calm and comfortable  Respiratory system: Clear to auscultation. Respiratory effort normal. No respiratory distress. No conversational dyspnea.  Cardiovascular system: S1 & S2 heard, RRR. No murmurs. No pedal edema. Gastrointestinal system: Abdomen is nondistended, soft and nontender. Normal bowel sounds heard. Central nervous system: Alert and oriented. No focal neurological deficits. Speech clear.  Extremities: Right index finger DIP joint inflamed, erythematous Psychiatry: Judgement and insight appear normal. Mood & affect appropriate.   Data Reviewed: I have personally reviewed following labs and imaging studies  CBC: Recent Labs  Lab 05/24/23 1736 05/25/23 0444  WBC 7.8 4.8  NEUTROABS 5.9  --   HGB 11.9* 9.8*  HCT 34.1* 28.6*  MCV 102.4* 106.3*  PLT 290 239   Basic Metabolic Panel: Recent Labs  Lab 05/24/23 1736 05/25/23 0444 05/25/23 2105 05/26/23 0500  NA 137 135  --  139  K 2.2* 2.5*  --  3.3*  CL 95* 102  --  108  CO2 27 25  --  23  GLUCOSE 116* 107*  --  94  BUN 8 6  --  <5*  CREATININE  0.83 0.65  --  0.67  CALCIUM 8.0* 6.8*  --  7.5*  MG 1.3* 1.6* 1.7  --   PHOS 2.0*  --   --   --    GFR: Estimated Creatinine Clearance: 65.9 mL/min (by C-G formula based on SCr of 0.67 mg/dL). Liver Function Tests: Recent Labs  Lab 05/24/23 1736 05/25/23 0444  AST 76* 55*  ALT 26 19  ALKPHOS 144* 104  BILITOT 1.4* 0.6  PROT 6.5 4.8*  ALBUMIN 3.2* 2.3*   Recent Labs  Lab 05/24/23 1736  LIPASE 26   No results for input(s): "AMMONIA" in the last 168 hours. Coagulation Profile: No results for input(s): "INR", "PROTIME" in the last 168 hours. Cardiac Enzymes: No results for  input(s): "CKTOTAL", "CKMB", "CKMBINDEX", "TROPONINI" in the last 168 hours. BNP (last 3 results) No results for input(s): "PROBNP" in the last 8760 hours. HbA1C: No results for input(s): "HGBA1C" in the last 72 hours. CBG: No results for input(s): "GLUCAP" in the last 168 hours. Lipid Profile: No results for input(s): "CHOL", "HDL", "LDLCALC", "TRIG", "CHOLHDL", "LDLDIRECT" in the last 72 hours. Thyroid Function Tests: No results for input(s): "TSH", "T4TOTAL", "FREET4", "T3FREE", "THYROIDAB" in the last 72 hours. Anemia Panel: No results for input(s): "VITAMINB12", "FOLATE", "FERRITIN", "TIBC", "IRON", "RETICCTPCT" in the last 72 hours. Sepsis Labs: No results for input(s): "PROCALCITON", "LATICACIDVEN" in the last 168 hours.  Recent Results (from the past 240 hour(s))  Resp panel by RT-PCR (RSV, Flu A&B, Covid) Anterior Nasal Swab     Status: None   Collection Time: 05/24/23  1:19 PM   Specimen: Anterior Nasal Swab  Result Value Ref Range Status   SARS Coronavirus 2 by RT PCR NEGATIVE NEGATIVE Final    Comment: (NOTE) SARS-CoV-2 target nucleic acids are NOT DETECTED.  The SARS-CoV-2 RNA is generally detectable in upper respiratory specimens during the acute phase of infection. The lowest concentration of SARS-CoV-2 viral copies this assay can detect is 138 copies/mL. A negative result does not preclude SARS-Cov-2 infection and should not be used as the sole basis for treatment or other patient management decisions. A negative result may occur with  improper specimen collection/handling, submission of specimen other than nasopharyngeal swab, presence of viral mutation(s) within the areas targeted by this assay, and inadequate number of viral copies(<138 copies/mL). A negative result must be combined with clinical observations, patient history, and epidemiological information. The expected result is Negative.  Fact Sheet for Patients:   BloggerCourse.com  Fact Sheet for Healthcare Providers:  SeriousBroker.it  This test is no t yet approved or cleared by the [place] FDA and  has been authorized for detection and/or diagnosis of SARS-CoV-2 by FDA under an Emergency Use Authorization (EUA). This EUA will remain  in effect (meaning this test can be used) for the duration of the COVID-19 declaration under Section 564(b)(1) of the Act, 21 U.S.C.section 360bbb-3(b)(1), unless the authorization is terminated  or revoked sooner.       Influenza A by PCR NEGATIVE NEGATIVE Final   Influenza B by PCR NEGATIVE NEGATIVE Final    Comment: (NOTE) The Xpert Xpress SARS-CoV-2/FLU/RSV plus assay is intended as an aid in the diagnosis of influenza from Nasopharyngeal swab specimens and should not be used as a sole basis for treatment. Nasal washings and aspirates are unacceptable for Xpert Xpress SARS-CoV-2/FLU/RSV testing.  Fact Sheet for Patients: BloggerCourse.com  Fact Sheet for Healthcare Providers: SeriousBroker.it  This test is not yet approved or cleared by the [place] FDA  and has been authorized for detection and/or diagnosis of SARS-CoV-2 by FDA under an Emergency Use Authorization (EUA). This EUA will remain in effect (meaning this test can be used) for the duration of the COVID-19 declaration under Section 564(b)(1) of the Act, 21 U.S.C. section 360bbb-3(b)(1), unless the authorization is terminated or revoked.     Resp Syncytial Virus by PCR NEGATIVE NEGATIVE Final    Comment: (NOTE) Fact Sheet for Patients: BloggerCourse.com  Fact Sheet for Healthcare Providers: SeriousBroker.it  This test is not yet approved or cleared by the [place] FDA and has been authorized for detection and/or diagnosis of SARS-CoV-2 by FDA under an Emergency Use  Authorization (EUA). This EUA will remain in effect (meaning this test can be used) for the duration of the COVID-19 declaration under Section 564(b)(1) of the Act, 21 U.S.C. section 360bbb-3(b)(1), unless the authorization is terminated or revoked.  Performed at [facility], [address] [place], [place] [address]   Gastrointestinal Panel by PCR , Stool     Status: None   Collection Time: 05/25/23  6:01 AM   Specimen: Stool  Result Value Ref Range Status   Campylobacter species NOT DETECTED NOT DETECTED Final   Plesimonas shigelloides NOT DETECTED NOT DETECTED Final   Salmonella species NOT DETECTED NOT DETECTED Final   Yersinia enterocolitica NOT DETECTED NOT DETECTED Final   Vibrio species NOT DETECTED NOT DETECTED Final   Vibrio cholerae NOT DETECTED NOT DETECTED Final   Enteroaggregative E coli (EAEC) NOT DETECTED NOT DETECTED Final   Enteropathogenic E coli (EPEC) NOT DETECTED NOT DETECTED Final   Enterotoxigenic E coli (ETEC) NOT DETECTED NOT DETECTED Final   Shiga like toxin producing E coli (STEC) NOT DETECTED NOT DETECTED Final   Shigella/Enteroinvasive E coli (EIEC) NOT DETECTED NOT DETECTED Final   Cryptosporidium NOT DETECTED NOT DETECTED Final   Cyclospora cayetanensis NOT DETECTED NOT DETECTED Final   Entamoeba histolytica NOT DETECTED NOT DETECTED Final   Giardia lamblia NOT DETECTED NOT DETECTED Final   Adenovirus F40/41 NOT DETECTED NOT DETECTED Final   Astrovirus NOT DETECTED NOT DETECTED Final   Norovirus GI/GII NOT DETECTED NOT DETECTED Final   Rotavirus A NOT DETECTED NOT DETECTED Final   Sapovirus (I, II, IV, and V) NOT DETECTED NOT DETECTED Final    Comment: Performed at [facility], [address], [place], [place] [address]      Radiology Studies: DG Hand 2 View Right  Result Date: 05/24/2023 CLINICAL DATA:  Pain, redness, swelling in right index finger EXAM: RIGHT HAND - 2 VIEW COMPARISON:  None Available. FINDINGS:  Advanced osteoarthritis at the right index finger DIP joint. Erosions are noted suggesting erosive arthritis. Overlying soft tissue swelling. No acute bony abnormality. Specifically, no fracture, subluxation, or dislocation. IMPRESSION: No acute bony abnormality. 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 Electronically Signed   By: Charlett Nose M.D.   On: 05/24/2023 22:09   CT ABDOMEN PELVIS W CONTRAST  Result Date: 05/24/2023 CLINICAL DATA:  Crohn's exacerbation, flu like symptoms. Cough, congestion, fevers EXAM: CT ABDOMEN AND PELVIS WITH CONTRAST TECHNIQUE: Multidetector CT imaging of the abdomen and pelvis was performed using the standard protocol following bolus administration of intravenous contrast. RADIATION DOSE REDUCTION: This exam was performed according to the departmental dose-optimization program which includes automated exposure control, adjustment of the mA and/or kV according to patient size and/or use of iterative reconstruction technique. CONTRAST:  OMNIPAQUE IOHEXOL 300 MG/ML  SOLN COMPARISON:  01/20/2023 FINDINGS: Lower chest: No acute abnormality. Hepatobiliary: Diffuse low-density throughout the liver compatible with fatty infiltration. No focal abnormality. Gallbladder unremarkable. Pancreas: No focal abnormality or ductal dilatation. Spleen: No focal abnormality.  Normal size. Adrenals/Urinary Tract: Areas of cortical thinning in the kidneys bilaterally. No suspicious renal or adrenal lesion. No hydronephrosis. Urinary bladder unremarkable. Stomach/Bowel: Stomach, large and small bowel grossly unremarkable. Postoperative changes in the right abdomen. No inflammatory changes. Vascular/Lymphatic: Aortic atherosclerosis. No evidence of aneurysm or adenopathy. Reproductive: Uterus and adnexa unremarkable.  No mass. Other: Trace free fluid in the cul-de-sac.  No free air. Musculoskeletal: No acute bony abnormality. IMPRESSION: Diffuse fatty  infiltration of the liver. Postoperative changes in the region of the cecum/terminal ileum. No acute findings. No active inflammatory process. Aortic atherosclerosis. Electronically Signed   By: Charlett Nose M.D.   On: 05/24/2023 20:28   DG Chest 2 View  Result Date: 05/24/2023 CLINICAL DATA:  Cough. EXAM: CHEST - 2 VIEW COMPARISON:  01/20/2023. FINDINGS: Bilateral lungs appear hyperlucent with coarse bronchovascular markings, in keeping with COPD. Bilateral lungs otherwise appear clear. No dense consolidation. Bilateral costophrenic angles are clear. Normal cardio-mediastinal silhouette. No acute osseous abnormalities. The soft tissues are within normal limits. IMPRESSION: 1. COPD. No active cardiopulmonary disease. Electronically Signed   By: Jules Schick M.D.   On: 05/24/2023 18:34      Scheduled Meds:  amLODipine  5 mg Oral Daily   buprenorphine  4 mg Sublingual Daily   enoxaparin (LOVENOX) injection  40 mg Subcutaneous Q24H   feeding supplement  237 mL Oral BID BM   nicotine  14 mg Transdermal Daily   pravastatin  20 mg Oral Daily   Continuous Infusions:  sodium chloride 100 mL/hr at 05/26/23 0519     LOS: 1 day   Time spent: 25 minutes   Noralee Stain, DO [facility] 05/26/2023, 12:28 PM   Available via Epic secure chat 7am-7pm After these hours, please refer to coverage provider listed on [email or web address]

## 2023-05-26 NOTE — Progress Notes (Signed)
NP Virgel Manifold was notified that patient's BP is 172/96.

## 2023-05-27 DIAGNOSIS — R112 Nausea with vomiting, unspecified: Secondary | ICD-10-CM | POA: Diagnosis not present

## 2023-05-27 DIAGNOSIS — R197 Diarrhea, unspecified: Secondary | ICD-10-CM | POA: Diagnosis not present

## 2023-05-27 LAB — BASIC METABOLIC PANEL
Anion gap: 9 (ref 5–15)
BUN: <5 mg/dL — ABNORMAL LOW (ref 6–20)
CO2: 24 mmol/L (ref 22–32)
Calcium: 7.7 mg/dL — ABNORMAL LOW (ref 8.9–10.3)
Chloride: 103 mmol/L (ref 98–111)
Creatinine, Ser: 0.56 mg/dL (ref 0.44–1.00)
GFR, Estimated: >60 mL/min (ref >60)
Glucose, Bld: 96 mg/dL (ref 70–99)
Potassium: 2.7 mmol/L — CL (ref 3.5–5.1)
Sodium: 136 mmol/L (ref 135–145)

## 2023-05-27 LAB — MAGNESIUM: Magnesium: 1.1 mg/dL — ABNORMAL LOW (ref 1.7–2.4)

## 2023-05-27 MED ORDER — MAGNESIUM SULFATE 4 GM/100ML IV SOLN
4.0000 g | Freq: Once | INTRAVENOUS | Status: AC
  Administered 2023-05-27: 4 g via INTRAVENOUS
  Filled 2023-05-27: qty 100

## 2023-05-27 MED ORDER — GUAIFENESIN-DM 100-10 MG/5ML PO SYRP
5.0000 mL | ORAL_SOLUTION | ORAL | Status: DC | PRN
  Administered 2023-05-27 – 2023-05-28 (×2): 5 mL via ORAL
  Filled 2023-05-27 (×2): qty 10

## 2023-05-27 MED ORDER — POTASSIUM CHLORIDE CRYS ER 20 MEQ PO TBCR
40.0000 meq | EXTENDED_RELEASE_TABLET | ORAL | Status: AC
  Administered 2023-05-27 (×3): 40 meq via ORAL
  Filled 2023-05-27 (×3): qty 2

## 2023-05-27 NOTE — Plan of Care (Signed)
  Problem: Education: Goal: Knowledge of General Education information will improve Description Including pain rating scale, medication(s)/side effects and non-pharmacologic comfort measures Outcome: Progressing   Problem: Health Behavior/Discharge Planning: Goal: Ability to manage health-related needs will improve Outcome: Progressing   

## 2023-05-27 NOTE — Progress Notes (Signed)
MD Girguis was notified of critical lab, potassium 2.7

## 2023-05-27 NOTE — Progress Notes (Signed)
PROGRESS NOTE    [name]  ZOX:[id number] DOB: 13-Feb-1971 DOA: 05/24/2023 PCP: [facility]     Brief Narrative:  [name] is a 52 y.o. 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   New events last 24 hours / Subjective: Slowly improving, remains very weak  Assessment & Plan:   Principal Problem:   Nausea vomiting and diarrhea Active Problems:   Hypomagnesemia   History of heroin use   Crohn's disease   Hypokalemia   Hypophosphatemia   Prolonged QT interval   Tobacco use   Acquired deformity of joint of finger of right hand   Abnormal LFTs   Chest pain   Nausea, vomiting, diarrhea -CT abdomen pelvis no acute findings -Lipase 26 -GI PCR panel negative, C. difficile could not collect as bowel movement and moved -Supportive care -Slowly improving  History of heroin abuse -Now on Suboxone  Gout right index finger -Uric acid 13.6 -Colchicine   Crohn's disease -No acute inflammation seen on CT abdomen pelvis  Tobacco abuse -Nicotine patch  Prolonged Qtc -Repeat EKG reviewed independently.  Normal sinus rhythm with QTc 481  Hypertension -Norvasc added  Hypomagnesemia Hypokalemia -Replace  DVT prophylaxis:  enoxaparin (LOVENOX) injection 40 mg Start: 05/25/23 0800  Code Status: Full code Family Communication: None at bedside Disposition Plan: Home Status is: Inpatient Remains inpatient appropriate because: Continue supportive care, replace electrolytes    Antimicrobials:  Anti-infectives (From admission, onward)    None        Objective: Vitals:   05/26/23 1111 05/26/23 2040 05/27/23 0530 05/27/23 0606  BP: (!) 176/109 (!) 153/95 (!) 174/105 (!) 155/107  Pulse: 72 76 72 70  Resp:  15 17   Temp: 97.9 F (36.6 C) 99.3 F (37.4 C) 98.6 F (37 C)   TempSrc: Oral Oral Oral    SpO2: 96% 97% 97%   Weight:      Height:        Intake/Output Summary (Last 24 hours) at 05/27/2023 1228 Last data filed at 05/27/2023 1000 Gross per 24 hour  Intake 3036.74 ml  Output 1400 ml  Net 1636.74 ml   Filed Weights   05/24/23 1308  Weight: 51.3 kg    Examination:  General exam: Appears calm and comfortable  Respiratory system: Clear to auscultation. Respiratory effort normal. No respiratory distress. No conversational dyspnea.  Cardiovascular system: S1 & S2 heard, RRR. No murmurs. No pedal edema. Gastrointestinal system: Abdomen is nondistended, soft and nontender. Normal bowel sounds heard. Central nervous system: Alert and oriented. No focal neurological deficits. Speech clear.  Psychiatry: Judgement and insight appear normal. Mood & affect appropriate.   Data Reviewed: I have personally reviewed following labs and imaging studies  CBC: Recent Labs  Lab 05/24/23 1736 05/25/23 0444  WBC 7.8 4.8  NEUTROABS 5.9  --   HGB 11.9* 9.8*  HCT 34.1* 28.6*  MCV 102.4* 106.3*  PLT 290 239   Basic Metabolic Panel: Recent Labs  Lab 05/24/23 1736 05/25/23 0444 05/25/23 2105 05/26/23 0500 05/27/23 0507  NA 137 135  --  139 136  K 2.2* 2.5*  --  3.3* 2.7*  CL 95* 102  --  108 103  CO2 27 25  --  23 24  GLUCOSE 116* 107*  --  94 96  BUN 8 6  --  <5* <5*  CREATININE 0.83 0.65  --  0.67 0.56  CALCIUM 8.0* 6.8*  --  7.5* 7.7*  MG 1.3* 1.6* 1.7  --  1.1*  PHOS 2.0*  --   --   --   --    GFR: Estimated Creatinine Clearance: 65.9 mL/min (by C-G formula based on SCr of 0.56 mg/dL). Liver Function Tests: Recent Labs  Lab 05/24/23 1736 05/25/23 0444  AST 76* 55*  ALT 26 19  ALKPHOS 144* 104  BILITOT 1.4* 0.6  PROT 6.5 4.8*  ALBUMIN 3.2* 2.3*   Recent Labs  Lab 05/24/23 1736  LIPASE 26   No results for input(s): "AMMONIA" in the last 168 hours. Coagulation Profile: No results for input(s): "INR", "PROTIME" in the last 168 hours. Cardiac Enzymes: No  results for input(s): "CKTOTAL", "CKMB", "CKMBINDEX", "TROPONINI" in the last 168 hours. BNP (last 3 results) No results for input(s): "PROBNP" in the last 8760 hours. HbA1C: No results for input(s): "HGBA1C" in the last 72 hours. CBG: No results for input(s): "GLUCAP" in the last 168 hours. Lipid Profile: No results for input(s): "CHOL", "HDL", "LDLCALC", "TRIG", "CHOLHDL", "LDLDIRECT" in the last 72 hours. Thyroid Function Tests: No results for input(s): "TSH", "T4TOTAL", "FREET4", "T3FREE", "THYROIDAB" in the last 72 hours. Anemia Panel: No results for input(s): "VITAMINB12", "FOLATE", "FERRITIN", "TIBC", "IRON", "RETICCTPCT" in the last 72 hours. Sepsis Labs: No results for input(s): "PROCALCITON", "LATICACIDVEN" in the last 168 hours.  Recent Results (from the past 240 hour(s))  Resp panel by RT-PCR (RSV, Flu A&B, Covid) Anterior Nasal Swab     Status: None   Collection Time: 05/24/23  1:19 PM   Specimen: Anterior Nasal Swab  Result Value Ref Range Status   SARS Coronavirus 2 by RT PCR NEGATIVE NEGATIVE Final    Comment: (NOTE) SARS-CoV-2 target nucleic acids are NOT DETECTED.  The SARS-CoV-2 RNA is generally detectable in upper respiratory specimens during the acute phase of infection. The lowest concentration of SARS-CoV-2 viral copies this assay can detect is 138 copies/mL. A negative result does not preclude SARS-Cov-2 infection and should not be used as the sole basis for treatment or other patient management decisions. A negative result may occur with  improper specimen collection/handling, submission of specimen other than nasopharyngeal swab, presence of viral mutation(s) within the areas targeted by this assay, and inadequate number of viral copies(<138 copies/mL). A negative result must be combined with clinical observations, patient history, and epidemiological information. The expected result is Negative.  Fact Sheet for Patients:   BloggerCourse.com  Fact Sheet for Healthcare Providers:  SeriousBroker.it  This test is no t yet approved or cleared by the [place] FDA and  has been authorized for detection and/or diagnosis of SARS-CoV-2 by FDA under an Emergency Use Authorization (EUA). This EUA will remain  in effect (meaning this test can be used) for the duration of the COVID-19 declaration under Section 564(b)(1) of the Act, 21 U.S.C.section 360bbb-3(b)(1), unless the authorization is terminated  or revoked sooner.       Influenza A by PCR NEGATIVE NEGATIVE Final   Influenza B by PCR NEGATIVE NEGATIVE Final    Comment: (NOTE) The Xpert Xpress SARS-CoV-2/FLU/RSV plus assay is intended as an aid in the diagnosis of influenza from Nasopharyngeal swab specimens and should not be used as a sole basis for treatment. Nasal washings and aspirates are unacceptable for Xpert Xpress SARS-CoV-2/FLU/RSV testing.  Fact Sheet for Patients: BloggerCourse.com  Fact Sheet for Healthcare Providers: SeriousBroker.it  This test is not yet approved or cleared by the [place]  FDA and has been authorized for detection and/or diagnosis of SARS-CoV-2 by FDA under an Emergency Use Authorization (EUA). This EUA will remain in effect (meaning this test can be used) for the duration of the COVID-19 declaration under Section 564(b)(1) of the Act, 21 U.S.C. section 360bbb-3(b)(1), unless the authorization is terminated or revoked.     Resp Syncytial Virus by PCR NEGATIVE NEGATIVE Final    Comment: (NOTE) Fact Sheet for Patients: BloggerCourse.com  Fact Sheet for Healthcare Providers: SeriousBroker.it  This test is not yet approved or cleared by the [place] FDA and has been authorized for detection and/or diagnosis of SARS-CoV-2 by FDA under an Emergency Use  Authorization (EUA). This EUA will remain in effect (meaning this test can be used) for the duration of the COVID-19 declaration under Section 564(b)(1) of the Act, 21 U.S.C. section 360bbb-3(b)(1), unless the authorization is terminated or revoked.  Performed at [facility], [address] [place], [place] [address]   Gastrointestinal Panel by PCR , Stool     Status: None   Collection Time: 05/25/23  6:01 AM   Specimen: Stool  Result Value Ref Range Status   Campylobacter species NOT DETECTED NOT DETECTED Final   Plesimonas shigelloides NOT DETECTED NOT DETECTED Final   Salmonella species NOT DETECTED NOT DETECTED Final   Yersinia enterocolitica NOT DETECTED NOT DETECTED Final   Vibrio species NOT DETECTED NOT DETECTED Final   Vibrio cholerae NOT DETECTED NOT DETECTED Final   Enteroaggregative E coli (EAEC) NOT DETECTED NOT DETECTED Final   Enteropathogenic E coli (EPEC) NOT DETECTED NOT DETECTED Final   Enterotoxigenic E coli (ETEC) NOT DETECTED NOT DETECTED Final   Shiga like toxin producing E coli (STEC) NOT DETECTED NOT DETECTED Final   Shigella/Enteroinvasive E coli (EIEC) NOT DETECTED NOT DETECTED Final   Cryptosporidium NOT DETECTED NOT DETECTED Final   Cyclospora cayetanensis NOT DETECTED NOT DETECTED Final   Entamoeba histolytica NOT DETECTED NOT DETECTED Final   Giardia lamblia NOT DETECTED NOT DETECTED Final   Adenovirus F40/41 NOT DETECTED NOT DETECTED Final   Astrovirus NOT DETECTED NOT DETECTED Final   Norovirus GI/GII NOT DETECTED NOT DETECTED Final   Rotavirus A NOT DETECTED NOT DETECTED Final   Sapovirus (I, II, IV, and V) NOT DETECTED NOT DETECTED Final    Comment: Performed at [facility], [address] [place], [place] [address]      Radiology Studies: No results found.    Scheduled Meds:  amLODipine  5 mg Oral Daily   buprenorphine  4 mg Sublingual Daily   enoxaparin (LOVENOX) injection  40 mg Subcutaneous Q24H    feeding supplement  237 mL Oral BID BM   nicotine  14 mg Transdermal Daily   potassium chloride  40 mEq Oral Q4H   pravastatin  20 mg Oral Daily   Continuous Infusions:     LOS: 2 days   Time spent: 25 minutes   Noralee Stain, DO [facility] 05/27/2023, 12:28 PM   Available via Epic secure chat 7am-7pm After these hours, please refer to coverage provider listed on [email or web address]

## 2023-05-28 DIAGNOSIS — R197 Diarrhea, unspecified: Secondary | ICD-10-CM | POA: Diagnosis not present

## 2023-05-28 DIAGNOSIS — R112 Nausea with vomiting, unspecified: Secondary | ICD-10-CM | POA: Diagnosis not present

## 2023-05-28 LAB — COMPREHENSIVE METABOLIC PANEL
ALT: 85 U/L — ABNORMAL HIGH (ref 0–44)
AST: 251 U/L — ABNORMAL HIGH (ref 15–41)
Albumin: 2.6 g/dL — ABNORMAL LOW (ref 3.5–5.0)
Alkaline Phosphatase: 180 U/L — ABNORMAL HIGH (ref 38–126)
Anion gap: 9 (ref 5–15)
BUN: 8 mg/dL (ref 6–20)
CO2: 25 mmol/L (ref 22–32)
Calcium: 8.5 mg/dL — ABNORMAL LOW (ref 8.9–10.3)
Chloride: 104 mmol/L (ref 98–111)
Creatinine, Ser: 0.59 mg/dL (ref 0.44–1.00)
GFR, Estimated: >60 mL/min (ref >60)
Glucose, Bld: 98 mg/dL (ref 70–99)
Potassium: 3.5 mmol/L (ref 3.5–5.1)
Sodium: 138 mmol/L (ref 135–145)
Total Bilirubin: 0.7 mg/dL (ref 0.3–1.2)
Total Protein: 5.3 g/dL — ABNORMAL LOW (ref 6.5–8.1)

## 2023-05-28 LAB — HEPATITIS PANEL, ACUTE
HCV Ab: NONREACTIVE
Hep A IgM: NONREACTIVE
Hep B C IgM: NONREACTIVE
Hepatitis B Surface Ag: NONREACTIVE

## 2023-05-28 LAB — MAGNESIUM: Magnesium: 1.7 mg/dL (ref 1.7–2.4)

## 2023-05-28 MED ORDER — POTASSIUM CHLORIDE CRYS ER 20 MEQ PO TBCR
40.0000 meq | EXTENDED_RELEASE_TABLET | Freq: Once | ORAL | Status: AC
  Administered 2023-05-28: 40 meq via ORAL
  Filled 2023-05-28: qty 2

## 2023-05-28 MED ORDER — AMLODIPINE BESYLATE 10 MG PO TABS
10.0000 mg | ORAL_TABLET | Freq: Every day | ORAL | Status: DC
  Administered 2023-05-29: 10 mg via ORAL
  Filled 2023-05-28: qty 1

## 2023-05-28 NOTE — Progress Notes (Signed)
PT Cancellation Note / Screen  Patient Details Name: [name] MRN: [id number] DOB: 03-08-1971   Cancelled Treatment:    Reason Eval/Treat Not Completed: PT screened, no needs identified, will sign off Pt mobilized with mobility specialist and mod I with cane (which RN reports is her baseline).  Pt does not appear to need acute PT at this time.  RN in agreement.  Pt can continue to mobilize with staff/mobility specialists during remainder of admission.   Janan Halter [place] 05/28/2023, 2:37 PM Paulino Door, DPT Physical Therapist Acute Rehabilitation Services Office: [phone]

## 2023-05-28 NOTE — Progress Notes (Signed)
Mobility Specialist - Progress Note   05/28/23 1252  Mobility  Activity Ambulated with assistance in hallway  Level of Assistance Modified independent, requires aide device or extra time  Assistive Device Cane  Distance Ambulated (ft) 480 ft  Activity Response Tolerated well  Mobility Referral Yes  $Mobility charge 1 Mobility  Mobility Specialist Start Time (ACUTE ONLY) 1242  Mobility Specialist Stop Time (ACUTE ONLY) 1250  Mobility Specialist Time Calculation (min) (ACUTE ONLY) 8 min   Pt received in bed and agreeable to mobility. No complaints during session. Pt to bed after session with all needs met.    [facility]

## 2023-05-28 NOTE — Progress Notes (Signed)
PROGRESS NOTE    [name]  JYN:[id number] DOB: 23-Jan-1971 DOA: 05/24/2023 PCP: [facility]     Brief Narrative:  [name] is a 52 y.o. 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   New events last 24 hours / Subjective: Does not feel she can go home yet, remains very weak.  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 difficile could not collect as bowel movement improved -Supportive care -Slowly improving  History of heroin abuse -Now on Suboxone  Gout right index finger -Uric acid 13.6 -Colchicine   Crohn's disease -No acute inflammation seen on CT abdomen pelvis  Tobacco abuse -Nicotine patch  Prolonged Qtc -Repeat EKG reviewed independently.  Normal sinus rhythm with QTc 481  Hypertension -Norvasc   Hypokalemia -Replace  Elevated LFT, hepatic steatohepatitis -CT abdomen pelvis showed diffuse fatty infiltration of liver -Hepatitis panel pending  DVT prophylaxis:  enoxaparin (LOVENOX) injection 40 mg Start: 05/25/23 0800  Code Status: Full code Family Communication: None at bedside Disposition Plan: Home Status is: Inpatient Remains inpatient appropriate because: Continue supportive care, replace electrolytes    Antimicrobials:  Anti-infectives (From admission, onward)    None        Objective: Vitals:   05/27/23 0606 05/27/23 1345 05/27/23 2155 05/28/23 0554  BP: (!) 155/107 (!) 167/101 (!) 158/107  (!) 153/95  Pulse: 70 72 71 66  Resp:  16 18 18   Temp:  98.2 F (36.8 C) 98.7 F (37.1 C) 98.1 F (36.7 C)  TempSrc:  Oral Oral Oral  SpO2:  99% 95% 94%  Weight:      Height:        Intake/Output Summary (Last 24 hours) at 05/28/2023 1214 Last data filed at 05/28/2023 0600 Gross per 24 hour  Intake 560 ml  Output --  Net 560 ml   Filed Weights   05/24/23 1308  Weight: 51.3 kg    Examination:  General exam: Appears calm and comfortable  Respiratory system: Clear to auscultation. Respiratory effort normal. No respiratory distress. No conversational dyspnea.  Cardiovascular system: S1 & S2 heard, RRR. No murmurs. No pedal edema. Gastrointestinal system: Abdomen is nondistended, soft and nontender. Normal bowel sounds heard. Central nervous system: Alert and oriented. No focal neurological deficits. Speech clear.  Psychiatry: Judgement and insight appear normal. Mood & affect appropriate.   Data Reviewed: I have personally reviewed following labs and imaging studies  CBC: Recent Labs  Lab 05/24/23 1736 05/25/23 0444  WBC 7.8 4.8  NEUTROABS 5.9  --   HGB 11.9* 9.8*  HCT 34.1* 28.6*  MCV 102.4* 106.3*  PLT 290 239   Basic Metabolic Panel: Recent Labs  Lab 05/24/23 1736 05/25/23 0444 05/25/23 2105 05/26/23 0500 05/27/23 0507 05/28/23 0503  NA 137 135  --  139 136 138  K 2.2* 2.5*  --  3.3* 2.7* 3.5  CL 95* 102  --  108 103 104  CO2 27 25  --  23 24 25   GLUCOSE 116* 107*  --  94 96 98  BUN 8 6  --  <5* <5* 8  CREATININE 0.83 0.65  --  0.67 0.56 0.59  CALCIUM 8.0* 6.8*  --  7.5* 7.7* 8.5*  MG 1.3* 1.6* 1.7  --  1.1* 1.7  PHOS 2.0*  --   --   --   --   --    GFR: Estimated Creatinine Clearance: 65.9 mL/min (by C-G formula based on SCr of 0.59 mg/dL). Liver Function Tests: Recent Labs  Lab 05/24/23 1736 05/25/23 0444 05/28/23 0503  AST 76* 55* 251*  ALT 26 19 85*  ALKPHOS 144* 104 180*  BILITOT 1.4* 0.6 0.7  PROT 6.5 4.8* 5.3*  ALBUMIN 3.2* 2.3* 2.6*    Recent Labs  Lab 05/24/23 1736  LIPASE 26   No results for input(s): "AMMONIA" in the last 168 hours. Coagulation Profile: No results for input(s): "INR", "PROTIME" in the last 168 hours. Cardiac Enzymes: No results for input(s): "CKTOTAL", "CKMB", "CKMBINDEX", "TROPONINI" in the last 168 hours. BNP (last 3 results) No results for input(s): "PROBNP" in the last 8760 hours. HbA1C: No results for input(s): "HGBA1C" in the last 72 hours. CBG: No results for input(s): "GLUCAP" in the last 168 hours. Lipid Profile: No results for input(s): "CHOL", "HDL", "LDLCALC", "TRIG", "CHOLHDL", "LDLDIRECT" in the last 72 hours. Thyroid Function Tests: No results for input(s): "TSH", "T4TOTAL", "FREET4", "T3FREE", "THYROIDAB" in the last 72 hours. Anemia Panel: No results for input(s): "VITAMINB12", "FOLATE", "FERRITIN", "TIBC", "IRON", "RETICCTPCT" in the last 72 hours. Sepsis Labs: No results for input(s): "PROCALCITON", "LATICACIDVEN" in the last 168 hours.  Recent Results (from the past 240 hour(s))  Resp panel by RT-PCR (RSV, Flu A&B, Covid) Anterior Nasal Swab     Status: None   Collection Time: 05/24/23  1:19 PM   Specimen: Anterior Nasal Swab  Result Value Ref Range Status   SARS Coronavirus 2 by RT PCR NEGATIVE NEGATIVE Final    Comment: (NOTE) SARS-CoV-2 target nucleic acids are NOT DETECTED.  The SARS-CoV-2 RNA is generally detectable in upper respiratory specimens during the acute phase of infection. The lowest concentration of SARS-CoV-2 viral copies this assay can detect is 138 copies/mL. A negative result does not preclude SARS-Cov-2 infection and should not be used as the sole basis for treatment or other patient management decisions. A negative result may occur with  improper specimen collection/handling, submission of specimen other than nasopharyngeal swab, presence of viral mutation(s) within the areas targeted by this assay, and inadequate number of  viral copies(<138 copies/mL). A negative result must be combined with clinical observations, patient history, and epidemiological information. The expected result is Negative.  Fact Sheet for Patients:  BloggerCourse.com  Fact Sheet for Healthcare Providers:  SeriousBroker.it  This test is no t yet approved or cleared by the [place] FDA and  has been authorized for detection and/or diagnosis of SARS-CoV-2 by FDA under an Emergency Use Authorization (EUA). This EUA will remain  in effect (meaning this test can be used) for the duration of the COVID-19 declaration under Section 564(b)(1) of the Act, 21 U.S.C.section 360bbb-3(b)(1), unless the authorization is terminated  or revoked sooner.       Influenza A by PCR NEGATIVE NEGATIVE Final   Influenza B by PCR NEGATIVE NEGATIVE Final    Comment: (NOTE) The Xpert Xpress SARS-CoV-2/FLU/RSV plus assay is intended as an aid in the diagnosis of influenza from Nasopharyngeal swab specimens and should not be  used as a sole basis for treatment. Nasal washings and aspirates are unacceptable for Xpert Xpress SARS-CoV-2/FLU/RSV testing.  Fact Sheet for Patients: BloggerCourse.com  Fact Sheet for Healthcare Providers: SeriousBroker.it  This test is not yet approved or cleared by the [place] FDA and has been authorized for detection and/or diagnosis of SARS-CoV-2 by FDA under an Emergency Use Authorization (EUA). This EUA will remain in effect (meaning this test can be used) for the duration of the COVID-19 declaration under Section 564(b)(1) of the Act, 21 U.S.C. section 360bbb-3(b)(1), unless the authorization is terminated or revoked.     Resp Syncytial Virus by PCR NEGATIVE NEGATIVE Final    Comment: (NOTE) Fact Sheet for Patients: BloggerCourse.com  Fact Sheet for Healthcare  Providers: SeriousBroker.it  This test is not yet approved or cleared by the [place] FDA and has been authorized for detection and/or diagnosis of SARS-CoV-2 by FDA under an Emergency Use Authorization (EUA). This EUA will remain in effect (meaning this test can be used) for the duration of the COVID-19 declaration under Section 564(b)(1) of the Act, 21 U.S.C. section 360bbb-3(b)(1), unless the authorization is terminated or revoked.  Performed at [facility], [address], [place], [place] [address]   Gastrointestinal Panel by PCR , Stool     Status: None   Collection Time: 05/25/23  6:01 AM   Specimen: Stool  Result Value Ref Range Status   Campylobacter species NOT DETECTED NOT DETECTED Final   Plesimonas shigelloides NOT DETECTED NOT DETECTED Final   Salmonella species NOT DETECTED NOT DETECTED Final   Yersinia enterocolitica NOT DETECTED NOT DETECTED Final   Vibrio species NOT DETECTED NOT DETECTED Final   Vibrio cholerae NOT DETECTED NOT DETECTED Final   Enteroaggregative E coli (EAEC) NOT DETECTED NOT DETECTED Final   Enteropathogenic E coli (EPEC) NOT DETECTED NOT DETECTED Final   Enterotoxigenic E coli (ETEC) NOT DETECTED NOT DETECTED Final   Shiga like toxin producing E coli (STEC) NOT DETECTED NOT DETECTED Final   Shigella/Enteroinvasive E coli (EIEC) NOT DETECTED NOT DETECTED Final   Cryptosporidium NOT DETECTED NOT DETECTED Final   Cyclospora cayetanensis NOT DETECTED NOT DETECTED Final   Entamoeba histolytica NOT DETECTED NOT DETECTED Final   Giardia lamblia NOT DETECTED NOT DETECTED Final   Adenovirus F40/41 NOT DETECTED NOT DETECTED Final   Astrovirus NOT DETECTED NOT DETECTED Final   Norovirus GI/GII NOT DETECTED NOT DETECTED Final   Rotavirus A NOT DETECTED NOT DETECTED Final   Sapovirus (I, II, IV, and V) NOT DETECTED NOT DETECTED Final    Comment: Performed at [facility], [address]  [place], [place] [address]      Radiology Studies: No results found.    Scheduled Meds:  amLODipine  5 mg Oral Daily   buprenorphine  4 mg Sublingual Daily   enoxaparin (LOVENOX) injection  40 mg Subcutaneous Q24H   feeding supplement  237 mL Oral BID BM   nicotine  14 mg Transdermal Daily   potassium chloride  40 mEq Oral Once   pravastatin  20 mg Oral Daily   Continuous Infusions:     LOS: 3 days   Time spent: 25 minutes   Noralee Stain, DO [facility] 05/28/2023, 12:14 PM   Available via Epic secure chat 7am-7pm After these hours, please refer to coverage provider listed on [email or web address]

## 2023-05-29 ENCOUNTER — Other Ambulatory Visit: Payer: Self-pay

## 2023-05-29 DIAGNOSIS — R112 Nausea with vomiting, unspecified: Secondary | ICD-10-CM | POA: Diagnosis not present

## 2023-05-29 DIAGNOSIS — R197 Diarrhea, unspecified: Secondary | ICD-10-CM | POA: Diagnosis not present

## 2023-05-29 LAB — COMPREHENSIVE METABOLIC PANEL
ALT: 72 U/L — ABNORMAL HIGH (ref 0–44)
AST: 115 U/L — ABNORMAL HIGH (ref 15–41)
Albumin: 2.7 g/dL — ABNORMAL LOW (ref 3.5–5.0)
Alkaline Phosphatase: 174 U/L — ABNORMAL HIGH (ref 38–126)
Anion gap: 9 (ref 5–15)
BUN: 12 mg/dL (ref 6–20)
CO2: 24 mmol/L (ref 22–32)
Calcium: 8.6 mg/dL — ABNORMAL LOW (ref 8.9–10.3)
Chloride: 106 mmol/L (ref 98–111)
Creatinine, Ser: 0.6 mg/dL (ref 0.44–1.00)
GFR, Estimated: >60 mL/min (ref >60)
Glucose, Bld: 91 mg/dL (ref 70–99)
Potassium: 3.8 mmol/L (ref 3.5–5.1)
Sodium: 139 mmol/L (ref 135–145)
Total Bilirubin: 0.7 mg/dL (ref 0.3–1.2)
Total Protein: 5.5 g/dL — ABNORMAL LOW (ref 6.5–8.1)

## 2023-05-29 LAB — MAGNESIUM: Magnesium: 1.5 mg/dL — ABNORMAL LOW (ref 1.7–2.4)

## 2023-05-29 MED ORDER — MAGNESIUM SULFATE 2 GM/50ML IV SOLN
2.0000 g | Freq: Once | INTRAVENOUS | Status: AC
  Administered 2023-05-29: 2 g via INTRAVENOUS
  Filled 2023-05-29: qty 50

## 2023-05-29 MED ORDER — LISINOPRIL 20 MG PO TABS
20.0000 mg | ORAL_TABLET | Freq: Every day | ORAL | Status: DC
  Administered 2023-05-29: 20 mg via ORAL
  Filled 2023-05-29: qty 1

## 2023-05-29 MED ORDER — LISINOPRIL 20 MG PO TABS
20.0000 mg | ORAL_TABLET | Freq: Every day | ORAL | 1 refills | Status: DC
  Filled 2023-05-29: qty 30, 30d supply, fill #0

## 2023-05-29 MED ORDER — DOXYCYCLINE HYCLATE 100 MG PO TABS
100.0000 mg | ORAL_TABLET | Freq: Two times a day (BID) | ORAL | 0 refills | Status: AC
  Filled 2023-05-29: qty 10, 5d supply, fill #0

## 2023-05-29 MED ORDER — AMLODIPINE BESYLATE 10 MG PO TABS
10.0000 mg | ORAL_TABLET | Freq: Every day | ORAL | 1 refills | Status: DC
  Filled 2023-05-29: qty 30, 30d supply, fill #0

## 2023-05-29 NOTE — Discharge Summary (Signed)
Physician Discharge Summary  [name] NFA:[id number] DOB: 18-Jul-1971 DOA: 05/24/2023  PCP: [facility]  Admit date: 05/24/2023 Discharge date: 05/29/2023  Admitted From: Home Disposition:  Home   Recommendations for Outpatient Follow-up:  Follow up with PCP   Discharge Condition: Stable CODE STATUS: Full  Diet recommendation: Regular   Brief/Interim Summary: [name] is a 52 y.o. 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 GI PCR was negative. Patient had clinical improvement of her symptoms with supportive measures and tolerating diet prior to discharge.   Discharge Diagnoses:  Principal Problem:   Nausea vomiting and diarrhea Active Problems:   Hypomagnesemia   History of heroin use   Crohn's disease   Hypokalemia   Hypophosphatemia   Prolonged QT interval   Tobacco use   Acquired deformity of joint of finger of right hand   Abnormal LFTs   Chest pain   Nausea, vomiting, diarrhea -CT abdomen pelvis no acute findings -Lipase 26 -GI PCR panel negative, C. difficile could not collect as bowel movement improved -Supportive care -Slowly improving   History of heroin abuse -Now on Suboxone   Gout right index finger -Uric acid 13.6 -Xray: No acute bony abnormality. 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  -Colchicine x2 -Now draining white fluid, prescribed doxy for 5 day course    Crohn's disease -No acute inflammation seen on CT abdomen pelvis   Tobacco abuse -Nicotine patch   Prolonged Qtc -Repeat EKG reviewed independently.  Normal sinus rhythm with QTc 481   Hypertension -Norvasc, lisinopril    Hypokalemia Hypomag -Replaced    Elevated LFT, hepatic steatohepatitis -CT abdomen pelvis showed diffuse fatty infiltration of liver -Hepatitis panel  NR -Improved     Discharge Instructions  Discharge Instructions     Call MD for:  difficulty breathing, headache or visual disturbances   Complete by: As directed    Call MD for:  extreme fatigue   Complete by: As directed    Call MD for:  persistant dizziness or light-headedness   Complete by: As directed    Call MD for:  persistant nausea and vomiting   Complete by: As directed    Call MD for:  severe uncontrolled pain   Complete by: As directed    Call MD for:  temperature >100.4   Complete by: As directed    Discharge instructions   Complete by: As directed    You were cared for by a hospitalist during your hospital stay. If you have any questions about your discharge medications or the care you received while you were in the hospital after you are discharged, you can call the unit and ask to speak with the hospitalist on call if the hospitalist that took care of you is not available. Once you are discharged, your primary care physician will handle any further medical issues. Please note that NO REFILLS for any discharge medications will be authorized once you are discharged, as it is imperative that you return to your primary care physician (or establish a relationship with a primary care physician if you do not have one) for your aftercare needs so that they can reassess your need for medications and monitor your lab values.   Increase activity slowly   Complete by: As directed       Allergies as of 05/29/2023  Reactions   Penicillins Hives   Did it involve swelling of the face/tongue/throat, SOB, or low BP? No Did it involve sudden or severe rash/hives, skin peeling, or any reaction on the inside of your mouth or nose? Yes Did you need to seek medical attention at a hospital or doctor's office? Unknown When did it last happen?    childhood   If all above answers are "NO", may proceed with cephalosporin use.        Medication List     TAKE these medications     amLODipine 10 MG tablet Commonly known as: NORVASC Take 1 tablet (10 mg total) by mouth daily. Start taking on: May 30, 2023   buprenorphine 8 MG Subl SL tablet Commonly known as: SUBUTEX Place 1/2 tablets (4 mg total) under the tongue 2 (two) times daily.   doxycycline 100 MG capsule Commonly known as: VIBRAMYCIN Take 1 capsule (100 mg total) by mouth 2 (two) times daily for 5 days.   lisinopril 20 MG tablet Commonly known as: ZESTRIL Take 1 tablet (20 mg total) by mouth daily. Start taking on: May 30, 2023   pravastatin 20 MG tablet Commonly known as: PRAVACHOL Take 1 tablet (20 mg total) by mouth daily.   traMADol 50 MG tablet Commonly known as: ULTRAM Take 1-2 tablets (50-100 mg total) by mouth every 8 (eight) hours as needed.        Follow-up Information     [facility].   Contact information: [address] [place] [address] [phone]                Allergies  Allergen Reactions   Penicillins Hives    Did it involve swelling of the face/tongue/throat, SOB, or low BP? No Did it involve sudden or severe rash/hives, skin peeling, or any reaction on the inside of your mouth or nose? Yes Did you need to seek medical attention at a hospital or doctor's office? Unknown When did it last happen?    childhood   If all above answers are "NO", may proceed with cephalosporin use.        Procedures/Studies: DG Hand 2 View Right  Result Date: 05/24/2023 CLINICAL DATA:  Pain, redness, swelling in right index finger EXAM: RIGHT HAND - 2 VIEW COMPARISON:  None Available. FINDINGS: Advanced osteoarthritis at the right index finger DIP joint. Erosions are noted suggesting erosive arthritis. Overlying soft tissue swelling. No acute bony abnormality. Specifically, no fracture, subluxation, or dislocation. IMPRESSION: No acute bony abnormality. 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  Electronically Signed   By: Charlett Nose M.D.   On: 05/24/2023 22:09   CT ABDOMEN PELVIS W CONTRAST  Result Date: 05/24/2023 CLINICAL DATA:  Crohn's exacerbation, flu like symptoms. Cough, congestion, fevers EXAM: CT ABDOMEN AND PELVIS WITH CONTRAST TECHNIQUE: Multidetector CT imaging of the abdomen and pelvis was performed using the standard protocol following bolus administration of intravenous contrast. RADIATION DOSE REDUCTION: This exam was performed according to the departmental dose-optimization program which includes automated exposure control, adjustment of the mA and/or kV according to patient size and/or use of iterative reconstruction technique. CONTRAST:  OMNIPAQUE IOHEXOL 300 MG/ML  SOLN COMPARISON:  01/20/2023 FINDINGS: Lower chest: No acute abnormality. Hepatobiliary: Diffuse low-density throughout the liver compatible with fatty infiltration. No focal abnormality. Gallbladder unremarkable. Pancreas: No focal abnormality or ductal dilatation. Spleen: No focal abnormality.  Normal size. Adrenals/Urinary Tract: Areas of cortical thinning in the kidneys  bilaterally. No suspicious renal or adrenal lesion. No hydronephrosis. Urinary bladder unremarkable. Stomach/Bowel: Stomach, large and small bowel grossly unremarkable. Postoperative changes in the right abdomen. No inflammatory changes. Vascular/Lymphatic: Aortic atherosclerosis. No evidence of aneurysm or adenopathy. Reproductive: Uterus and adnexa unremarkable.  No mass. Other: Trace free fluid in the cul-de-sac.  No free air. Musculoskeletal: No acute bony abnormality. IMPRESSION: Diffuse fatty infiltration of the liver. Postoperative changes in the region of the cecum/terminal ileum. No acute findings. No active inflammatory process. Aortic atherosclerosis. Electronically Signed   By: Charlett Nose M.D.   On: 05/24/2023 20:28   DG Chest 2 View  Result Date: 05/24/2023 CLINICAL DATA:  Cough. EXAM: CHEST - 2 VIEW COMPARISON:  01/20/2023.  FINDINGS: Bilateral lungs appear hyperlucent with coarse bronchovascular markings, in keeping with COPD. Bilateral lungs otherwise appear clear. No dense consolidation. Bilateral costophrenic angles are clear. Normal cardio-mediastinal silhouette. No acute osseous abnormalities. The soft tissues are within normal limits. IMPRESSION: 1. COPD. No active cardiopulmonary disease. Electronically Signed   By: Jules Schick M.D.   On: 05/24/2023 18:34       Discharge Exam: Vitals:   05/28/23 2131 05/29/23 0531  BP: (!) 161/104 (!) 160/101  Pulse: 68 69  Resp: 18 18  Temp: 98.5 F (36.9 C) 98.2 F (36.8 C)  SpO2: 95% 95%    General: Pt is alert, awake, not in acute distress Cardiovascular: RRR, S1/S2 +, no edema Respiratory: CTA bilaterally, no wheezing, no rhonchi, no respiratory distress, no conversational dyspnea  Abdominal: Soft, NT, ND, bowel sounds + Extremities: no edema, no cyanosis, right index DIP with mild inflmmation Psych: Normal mood and affect, stable judgement and insight     The results of significant diagnostics from this hospitalization (including imaging, microbiology, ancillary and laboratory) are listed below for reference.     Microbiology: Recent Results (from the past 240 hour(s))  Resp panel by RT-PCR (RSV, Flu A&B, Covid) Anterior Nasal Swab     Status: None   Collection Time: 05/24/23  1:19 PM   Specimen: Anterior Nasal Swab  Result Value Ref Range Status   SARS Coronavirus 2 by RT PCR NEGATIVE NEGATIVE Final    Comment: (NOTE) SARS-CoV-2 target nucleic acids are NOT DETECTED.  The SARS-CoV-2 RNA is generally detectable in upper respiratory specimens during the acute phase of infection. The lowest concentration of SARS-CoV-2 viral copies this assay can detect is 138 copies/mL. A negative result does not preclude SARS-Cov-2 infection and should not be used as the sole basis for treatment or other patient management decisions. A negative result may occur  with  improper specimen collection/handling, submission of specimen other than nasopharyngeal swab, presence of viral mutation(s) within the areas targeted by this assay, and inadequate number of viral copies(<138 copies/mL). A negative result must be combined with clinical observations, patient history, and epidemiological information. The expected result is Negative.  Fact Sheet for Patients:  BloggerCourse.com  Fact Sheet for Healthcare Providers:  SeriousBroker.it  This test is no t yet approved or cleared by the [place] FDA and  has been authorized for detection and/or diagnosis of SARS-CoV-2 by FDA under an Emergency Use Authorization (EUA). This EUA will remain  in effect (meaning this test can be used) for the duration of the COVID-19 declaration under Section 564(b)(1) of the Act, 21 U.S.C.section 360bbb-3(b)(1), unless the authorization is terminated  or revoked sooner.       Influenza A by PCR NEGATIVE NEGATIVE Final   Influenza B by PCR NEGATIVE  NEGATIVE Final    Comment: (NOTE) The Xpert Xpress SARS-CoV-2/FLU/RSV plus assay is intended as an aid in the diagnosis of influenza from Nasopharyngeal swab specimens and should not be used as a sole basis for treatment. Nasal washings and aspirates are unacceptable for Xpert Xpress SARS-CoV-2/FLU/RSV testing.  Fact Sheet for Patients: BloggerCourse.com  Fact Sheet for Healthcare Providers: SeriousBroker.it  This test is not yet approved or cleared by the [place] FDA and has been authorized for detection and/or diagnosis of SARS-CoV-2 by FDA under an Emergency Use Authorization (EUA). This EUA will remain in effect (meaning this test can be used) for the duration of the COVID-19 declaration under Section 564(b)(1) of the Act, 21 U.S.C. section 360bbb-3(b)(1), unless the authorization is terminated  or revoked.     Resp Syncytial Virus by PCR NEGATIVE NEGATIVE Final    Comment: (NOTE) Fact Sheet for Patients: BloggerCourse.com  Fact Sheet for Healthcare Providers: SeriousBroker.it  This test is not yet approved or cleared by the [place] FDA and has been authorized for detection and/or diagnosis of SARS-CoV-2 by FDA under an Emergency Use Authorization (EUA). This EUA will remain in effect (meaning this test can be used) for the duration of the COVID-19 declaration under Section 564(b)(1) of the Act, 21 U.S.C. section 360bbb-3(b)(1), unless the authorization is terminated or revoked.  Performed at [facility], [address], [place], [place] [address]   Gastrointestinal Panel by PCR , Stool     Status: None   Collection Time: 05/25/23  6:01 AM   Specimen: Stool  Result Value Ref Range Status   Campylobacter species NOT DETECTED NOT DETECTED Final   Plesimonas shigelloides NOT DETECTED NOT DETECTED Final   Salmonella species NOT DETECTED NOT DETECTED Final   Yersinia enterocolitica NOT DETECTED NOT DETECTED Final   Vibrio species NOT DETECTED NOT DETECTED Final   Vibrio cholerae NOT DETECTED NOT DETECTED Final   Enteroaggregative E coli (EAEC) NOT DETECTED NOT DETECTED Final   Enteropathogenic E coli (EPEC) NOT DETECTED NOT DETECTED Final   Enterotoxigenic E coli (ETEC) NOT DETECTED NOT DETECTED Final   Shiga like toxin producing E coli (STEC) NOT DETECTED NOT DETECTED Final   Shigella/Enteroinvasive E coli (EIEC) NOT DETECTED NOT DETECTED Final   Cryptosporidium NOT DETECTED NOT DETECTED Final   Cyclospora cayetanensis NOT DETECTED NOT DETECTED Final   Entamoeba histolytica NOT DETECTED NOT DETECTED Final   Giardia lamblia NOT DETECTED NOT DETECTED Final   Adenovirus F40/41 NOT DETECTED NOT DETECTED Final   Astrovirus NOT DETECTED NOT DETECTED Final   Norovirus GI/GII NOT DETECTED NOT DETECTED  Final   Rotavirus A NOT DETECTED NOT DETECTED Final   Sapovirus (I, II, IV, and V) NOT DETECTED NOT DETECTED Final    Comment: Performed at [facility], [address] [place], [place] [address]     Labs: BNP (last 3 results) No results for input(s): "BNP" in the last 8760 hours. Basic Metabolic Panel: Recent Labs  Lab 05/24/23 1736 05/25/23 0444 05/25/23 2105 05/26/23 0500 05/27/23 0507 05/28/23 0503 05/29/23 0430  NA 137 135  --  139 136 138 139  K 2.2* 2.5*  --  3.3* 2.7* 3.5 3.8  CL 95* 102  --  108 103 104 106  CO2 27 25  --  23 24 25 24   GLUCOSE 116* 107*  --  94 96 98 91  BUN 8 6  --  <5* <5* 8 12  CREATININE 0.83 0.65  --  0.67  0.56 0.59 0.60  CALCIUM 8.0* 6.8*  --  7.5* 7.7* 8.5* 8.6*  MG 1.3* 1.6* 1.7  --  1.1* 1.7 1.5*  PHOS 2.0*  --   --   --   --   --   --    Liver Function Tests: Recent Labs  Lab 05/24/23 1736 05/25/23 0444 05/28/23 0503 05/29/23 0430  AST 76* 55* 251* 115*  ALT 26 19 85* 72*  ALKPHOS 144* 104 180* 174*  BILITOT 1.4* 0.6 0.7 0.7  PROT 6.5 4.8* 5.3* 5.5*  ALBUMIN 3.2* 2.3* 2.6* 2.7*   Recent Labs  Lab 05/24/23 1736  LIPASE 26   No results for input(s): "AMMONIA" in the last 168 hours. CBC: Recent Labs  Lab 05/24/23 1736 05/25/23 0444  WBC 7.8 4.8  NEUTROABS 5.9  --   HGB 11.9* 9.8*  HCT 34.1* 28.6*  MCV 102.4* 106.3*  PLT 290 239   Cardiac Enzymes: No results for input(s): "CKTOTAL", "CKMB", "CKMBINDEX", "TROPONINI" in the last 168 hours. BNP: Invalid input(s): "POCBNP" CBG: No results for input(s): "GLUCAP" in the last 168 hours. D-Dimer No results for input(s): "DDIMER" in the last 72 hours. Hgb A1c No results for input(s): "HGBA1C" in the last 72 hours. Lipid Profile No results for input(s): "CHOL", "HDL", "LDLCALC", "TRIG", "CHOLHDL", "LDLDIRECT" in the last 72 hours. Thyroid function studies No results for input(s): "TSH", "T4TOTAL", "T3FREE", "THYROIDAB" in the last 72 hours.  Invalid  input(s): "FREET3" Anemia work up No results for input(s): "VITAMINB12", "FOLATE", "FERRITIN", "TIBC", "IRON", "RETICCTPCT" in the last 72 hours. Urinalysis    Component Value Date/Time   COLORURINE AMBER (A) 01/20/2023 1751   APPEARANCEUR CLOUDY (A) 01/20/2023 1751   LABSPEC 1.009 01/20/2023 1751   PHURINE 6.0 01/20/2023 1751   GLUCOSEU NEGATIVE 01/20/2023 1751   HGBUR SMALL (A) 01/20/2023 1751   BILIRUBINUR NEGATIVE 01/20/2023 1751   BILIRUBINUR moderate (A) 03/24/2021 0943   KETONESUR 5 (A) 01/20/2023 1751   PROTEINUR 30 (A) 01/20/2023 1751   UROBILINOGEN 2.0 (A) 03/24/2021 0943   UROBILINOGEN 0.2 04/24/2009 0504   NITRITE POSITIVE (A) 01/20/2023 1751   LEUKOCYTESUR LARGE (A) 01/20/2023 1751   Sepsis Labs Recent Labs  Lab 05/24/23 1736 05/25/23 0444  WBC 7.8 4.8   Microbiology Recent Results (from the past 240 hour(s))  Resp panel by RT-PCR (RSV, Flu A&B, Covid) Anterior Nasal Swab     Status: None   Collection Time: 05/24/23  1:19 PM   Specimen: Anterior Nasal Swab  Result Value Ref Range Status   SARS Coronavirus 2 by RT PCR NEGATIVE NEGATIVE Final    Comment: (NOTE) SARS-CoV-2 target nucleic acids are NOT DETECTED.  The SARS-CoV-2 RNA is generally detectable in upper respiratory specimens during the acute phase of infection. The lowest concentration of SARS-CoV-2 viral copies this assay can detect is 138 copies/mL. A negative result does not preclude SARS-Cov-2 infection and should not be used as the sole basis for treatment or other patient management decisions. A negative result may occur with  improper specimen collection/handling, submission of specimen other than nasopharyngeal swab, presence of viral mutation(s) within the areas targeted by this assay, and inadequate number of viral copies(<138 copies/mL). A negative result must be combined with clinical observations, patient history, and epidemiological information. The expected result is  Negative.  Fact Sheet for Patients:  BloggerCourse.com  Fact Sheet for Healthcare Providers:  SeriousBroker.it  This test is no t yet approved or cleared by the [place] and  has been authorized  for detection and/or diagnosis of SARS-CoV-2 by FDA under an Emergency Use Authorization (EUA). This EUA will remain  in effect (meaning this test can be used) for the duration of the COVID-19 declaration under Section 564(b)(1) of the Act, 21 U.S.C.section 360bbb-3(b)(1), unless the authorization is terminated  or revoked sooner.       Influenza A by PCR NEGATIVE NEGATIVE Final   Influenza B by PCR NEGATIVE NEGATIVE Final    Comment: (NOTE) The Xpert Xpress SARS-CoV-2/FLU/RSV plus assay is intended as an aid in the diagnosis of influenza from Nasopharyngeal swab specimens and should not be used as a sole basis for treatment. Nasal washings and aspirates are unacceptable for Xpert Xpress SARS-CoV-2/FLU/RSV testing.  Fact Sheet for Patients: BloggerCourse.com  Fact Sheet for Healthcare Providers: SeriousBroker.it  This test is not yet approved or cleared by the [place] FDA and has been authorized for detection and/or diagnosis of SARS-CoV-2 by FDA under an Emergency Use Authorization (EUA). This EUA will remain in effect (meaning this test can be used) for the duration of the COVID-19 declaration under Section 564(b)(1) of the Act, 21 U.S.C. section 360bbb-3(b)(1), unless the authorization is terminated or revoked.     Resp Syncytial Virus by PCR NEGATIVE NEGATIVE Final    Comment: (NOTE) Fact Sheet for Patients: BloggerCourse.com  Fact Sheet for Healthcare Providers: SeriousBroker.it  This test is not yet approved or cleared by the [place] FDA and has been authorized for detection and/or diagnosis of  SARS-CoV-2 by FDA under an Emergency Use Authorization (EUA). This EUA will remain in effect (meaning this test can be used) for the duration of the COVID-19 declaration under Section 564(b)(1) of the Act, 21 U.S.C. section 360bbb-3(b)(1), unless the authorization is terminated or revoked.  Performed at [facility], [address], [place], [place] [address]   Gastrointestinal Panel by PCR , Stool     Status: None   Collection Time: 05/25/23  6:01 AM   Specimen: Stool  Result Value Ref Range Status   Campylobacter species NOT DETECTED NOT DETECTED Final   Plesimonas shigelloides NOT DETECTED NOT DETECTED Final   Salmonella species NOT DETECTED NOT DETECTED Final   Yersinia enterocolitica NOT DETECTED NOT DETECTED Final   Vibrio species NOT DETECTED NOT DETECTED Final   Vibrio cholerae NOT DETECTED NOT DETECTED Final   Enteroaggregative E coli (EAEC) NOT DETECTED NOT DETECTED Final   Enteropathogenic E coli (EPEC) NOT DETECTED NOT DETECTED Final   Enterotoxigenic E coli (ETEC) NOT DETECTED NOT DETECTED Final   Shiga like toxin producing E coli (STEC) NOT DETECTED NOT DETECTED Final   Shigella/Enteroinvasive E coli (EIEC) NOT DETECTED NOT DETECTED Final   Cryptosporidium NOT DETECTED NOT DETECTED Final   Cyclospora cayetanensis NOT DETECTED NOT DETECTED Final   Entamoeba histolytica NOT DETECTED NOT DETECTED Final   Giardia lamblia NOT DETECTED NOT DETECTED Final   Adenovirus F40/41 NOT DETECTED NOT DETECTED Final   Astrovirus NOT DETECTED NOT DETECTED Final   Norovirus GI/GII NOT DETECTED NOT DETECTED Final   Rotavirus A NOT DETECTED NOT DETECTED Final   Sapovirus (I, II, IV, and V) NOT DETECTED NOT DETECTED Final    Comment: Performed at [facility], [address] [place], [place] [address]     Patient was seen and examined on the day of discharge and was found to be in stable condition. Time coordinating discharge: 25 minutes including  assessment and coordination of care, as well as examination of the patient.  SIGNED:  Noralee Stain, DO [facility] 05/29/2023, 8:57 AM

## 2023-05-29 NOTE — Progress Notes (Signed)
Patient was given discharge instructions, and all questions were answered.  Patient was stable for discharge and was taken to the main exit by wheelchair. 

## 2023-05-29 NOTE — Plan of Care (Signed)
  Problem: Clinical Measurements: Goal: Will remain free from infection Outcome: Progressing Goal: Diagnostic test results will improve Outcome: Progressing   

## 2023-05-30 ENCOUNTER — Other Ambulatory Visit: Payer: Self-pay

## 2023-05-31 ENCOUNTER — Other Ambulatory Visit: Payer: Self-pay

## 2023-05-31 MED ORDER — BUPRENORPHINE HCL 8 MG SL SUBL
4.0000 mg | SUBLINGUAL_TABLET | Freq: Two times a day (BID) | SUBLINGUAL | 0 refills | Status: DC
  Filled 2023-06-05: qty 30, 30d supply, fill #0

## 2023-05-31 MED ORDER — TRAMADOL HCL 50 MG PO TABS
50.0000 mg | ORAL_TABLET | Freq: Three times a day (TID) | ORAL | 0 refills | Status: DC | PRN
  Filled 2023-06-05: qty 180, 30d supply, fill #0

## 2023-06-05 ENCOUNTER — Other Ambulatory Visit: Payer: Self-pay

## 2023-07-03 ENCOUNTER — Other Ambulatory Visit: Payer: Self-pay

## 2023-07-03 MED ORDER — BUPRENORPHINE HCL 8 MG SL SUBL
SUBLINGUAL_TABLET | SUBLINGUAL | 0 refills | Status: DC
  Filled 2023-07-05: qty 30, 30d supply, fill #0

## 2023-07-03 MED ORDER — TRAMADOL HCL 50 MG PO TABS
50.0000 mg | ORAL_TABLET | Freq: Three times a day (TID) | ORAL | 0 refills | Status: DC | PRN
  Filled 2023-07-05: qty 180, 30d supply, fill #0

## 2023-07-05 ENCOUNTER — Other Ambulatory Visit: Payer: Self-pay

## 2023-07-10 ENCOUNTER — Emergency Department (HOSPITAL_COMMUNITY): Payer: MEDICAID

## 2023-07-10 ENCOUNTER — Inpatient Hospital Stay (HOSPITAL_COMMUNITY)
Admission: EM | Admit: 2023-07-10 | Discharge: 2023-07-13 | DRG: 386 | Disposition: A | Discharge status: Home / Self Care | Payer: MEDICAID | Attending: Internal Medicine | Admitting: Internal Medicine

## 2023-07-10 ENCOUNTER — Encounter (HOSPITAL_COMMUNITY): Payer: Self-pay

## 2023-07-10 ENCOUNTER — Other Ambulatory Visit: Payer: Self-pay

## 2023-07-10 DIAGNOSIS — F1021 Alcohol dependence, in remission: Secondary | ICD-10-CM | POA: Diagnosis present

## 2023-07-10 DIAGNOSIS — Z820 Family history of epilepsy and other diseases of the nervous system: Secondary | ICD-10-CM

## 2023-07-10 DIAGNOSIS — E876 Hypokalemia: Secondary | ICD-10-CM | POA: Diagnosis present

## 2023-07-10 DIAGNOSIS — G4733 Obstructive sleep apnea (adult) (pediatric): Secondary | ICD-10-CM | POA: Diagnosis present

## 2023-07-10 DIAGNOSIS — Z720 Tobacco use: Secondary | ICD-10-CM | POA: Diagnosis present

## 2023-07-10 DIAGNOSIS — M1A9XX1 Chronic gout, unspecified, with tophus (tophi): Secondary | ICD-10-CM | POA: Diagnosis present

## 2023-07-10 DIAGNOSIS — K760 Fatty (change of) liver, not elsewhere classified: Secondary | ICD-10-CM | POA: Diagnosis present

## 2023-07-10 DIAGNOSIS — Z880 Allergy status to penicillin: Secondary | ICD-10-CM

## 2023-07-10 DIAGNOSIS — I700 Atherosclerosis of aorta: Secondary | ICD-10-CM | POA: Diagnosis present

## 2023-07-10 DIAGNOSIS — Z681 Body mass index (BMI) 19 or less, adult: Secondary | ICD-10-CM | POA: Diagnosis not present

## 2023-07-10 DIAGNOSIS — K5090 Crohn's disease, unspecified, without complications: Principal | ICD-10-CM | POA: Diagnosis present

## 2023-07-10 DIAGNOSIS — R197 Diarrhea, unspecified: Secondary | ICD-10-CM

## 2023-07-10 DIAGNOSIS — E440 Moderate protein-calorie malnutrition: Secondary | ICD-10-CM | POA: Diagnosis present

## 2023-07-10 DIAGNOSIS — F1191 Opioid use, unspecified, in remission: Secondary | ICD-10-CM | POA: Diagnosis not present

## 2023-07-10 DIAGNOSIS — Z79899 Other long term (current) drug therapy: Secondary | ICD-10-CM

## 2023-07-10 DIAGNOSIS — Z23 Encounter for immunization: Secondary | ICD-10-CM | POA: Diagnosis not present

## 2023-07-10 DIAGNOSIS — F17210 Nicotine dependence, cigarettes, uncomplicated: Secondary | ICD-10-CM | POA: Diagnosis present

## 2023-07-10 DIAGNOSIS — F411 Generalized anxiety disorder: Secondary | ICD-10-CM | POA: Diagnosis present

## 2023-07-10 DIAGNOSIS — E785 Hyperlipidemia, unspecified: Secondary | ICD-10-CM | POA: Diagnosis present

## 2023-07-10 DIAGNOSIS — G894 Chronic pain syndrome: Secondary | ICD-10-CM | POA: Diagnosis present

## 2023-07-10 DIAGNOSIS — I10 Essential (primary) hypertension: Secondary | ICD-10-CM | POA: Diagnosis present

## 2023-07-10 DIAGNOSIS — F329 Major depressive disorder, single episode, unspecified: Secondary | ICD-10-CM | POA: Diagnosis present

## 2023-07-10 DIAGNOSIS — Z8349 Family history of other endocrine, nutritional and metabolic diseases: Secondary | ICD-10-CM

## 2023-07-10 DIAGNOSIS — M109 Gout, unspecified: Secondary | ICD-10-CM | POA: Diagnosis present

## 2023-07-10 DIAGNOSIS — R9431 Abnormal electrocardiogram [ECG] [EKG]: Secondary | ICD-10-CM | POA: Diagnosis present

## 2023-07-10 DIAGNOSIS — K529 Noninfective gastroenteritis and colitis, unspecified: Principal | ICD-10-CM

## 2023-07-10 DIAGNOSIS — R112 Nausea with vomiting, unspecified: Secondary | ICD-10-CM | POA: Diagnosis not present

## 2023-07-10 LAB — TROPONIN I (HIGH SENSITIVITY)
Troponin I (High Sensitivity): 12 ng/L (ref <18)
Troponin I (High Sensitivity): 13 ng/L (ref <18)

## 2023-07-10 LAB — CBC WITH DIFFERENTIAL/PLATELET
Abs Immature Granulocytes: 0.01 10*3/uL (ref 0.00–0.07)
Basophils Absolute: 0 10*3/uL (ref 0.0–0.1)
Basophils Relative: 0 %
Eosinophils Absolute: 0 10*3/uL (ref 0.0–0.5)
Eosinophils Relative: 0 %
HCT: 32.3 % — ABNORMAL LOW (ref 36.0–46.0)
Hemoglobin: 11.3 g/dL — ABNORMAL LOW (ref 12.0–15.0)
Immature Granulocytes: 0 %
Lymphocytes Relative: 19 %
Lymphs Abs: 1.3 10*3/uL (ref 0.7–4.0)
MCH: 35.4 pg — ABNORMAL HIGH (ref 26.0–34.0)
MCHC: 35 g/dL (ref 30.0–36.0)
MCV: 101.3 fL — ABNORMAL HIGH (ref 80.0–100.0)
Monocytes Absolute: 0.5 10*3/uL (ref 0.1–1.0)
Monocytes Relative: 7 %
Neutro Abs: 5.1 10*3/uL (ref 1.7–7.7)
Neutrophils Relative %: 74 %
Platelets: 388 10*3/uL (ref 150–400)
RBC: 3.19 MIL/uL — ABNORMAL LOW (ref 3.87–5.11)
RDW: 15.1 % (ref 11.5–15.5)
WBC: 7 10*3/uL (ref 4.0–10.5)
nRBC: 0 % (ref 0.0–0.2)

## 2023-07-10 LAB — COMPREHENSIVE METABOLIC PANEL
ALT: 25 U/L (ref 0–44)
AST: 72 U/L — ABNORMAL HIGH (ref 15–41)
Albumin: 3 g/dL — ABNORMAL LOW (ref 3.5–5.0)
Alkaline Phosphatase: 160 U/L — ABNORMAL HIGH (ref 38–126)
Anion gap: 16 — ABNORMAL HIGH (ref 5–15)
BUN: 9 mg/dL (ref 6–20)
CO2: 25 mmol/L (ref 22–32)
Calcium: 8.1 mg/dL — ABNORMAL LOW (ref 8.9–10.3)
Chloride: 97 mmol/L — ABNORMAL LOW (ref 98–111)
Creatinine, Ser: 0.91 mg/dL (ref 0.44–1.00)
GFR, Estimated: >60 mL/min (ref >60)
Glucose, Bld: 90 mg/dL (ref 70–99)
Potassium: 2.5 mmol/L — CL (ref 3.5–5.1)
Sodium: 138 mmol/L (ref 135–145)
Total Bilirubin: 1.4 mg/dL — ABNORMAL HIGH (ref <1.2)
Total Protein: 6.1 g/dL — ABNORMAL LOW (ref 6.5–8.1)

## 2023-07-10 LAB — MAGNESIUM: Magnesium: 1 mg/dL — ABNORMAL LOW (ref 1.7–2.4)

## 2023-07-10 LAB — LIPASE, BLOOD: Lipase: 26 U/L (ref 11–51)

## 2023-07-10 MED ORDER — PROCHLORPERAZINE EDISYLATE 10 MG/2ML IJ SOLN
10.0000 mg | INTRAMUSCULAR | Status: DC | PRN
  Administered 2023-07-11 – 2023-07-12 (×3): 10 mg via INTRAVENOUS
  Filled 2023-07-10 (×3): qty 2

## 2023-07-10 MED ORDER — AMLODIPINE BESYLATE 10 MG PO TABS
10.0000 mg | ORAL_TABLET | Freq: Every day | ORAL | Status: DC
  Administered 2023-07-11 – 2023-07-13 (×3): 10 mg via ORAL
  Filled 2023-07-10 (×3): qty 1

## 2023-07-10 MED ORDER — METRONIDAZOLE 500 MG/100ML IV SOLN
500.0000 mg | Freq: Once | INTRAVENOUS | Status: AC
  Administered 2023-07-10: 500 mg via INTRAVENOUS
  Filled 2023-07-10: qty 100

## 2023-07-10 MED ORDER — SODIUM CHLORIDE 0.9 % IV SOLN
2.0000 g | Freq: Once | INTRAVENOUS | Status: AC
  Administered 2023-07-10: 2 g via INTRAVENOUS
  Filled 2023-07-10: qty 20

## 2023-07-10 MED ORDER — ACETAMINOPHEN 325 MG PO TABS
650.0000 mg | ORAL_TABLET | Freq: Four times a day (QID) | ORAL | Status: DC | PRN
  Administered 2023-07-11 – 2023-07-12 (×2): 650 mg via ORAL
  Filled 2023-07-10 (×2): qty 2

## 2023-07-10 MED ORDER — MAGNESIUM SULFATE 4 GM/100ML IV SOLN
4.0000 g | Freq: Once | INTRAVENOUS | Status: AC
  Administered 2023-07-11: 4 g via INTRAVENOUS
  Filled 2023-07-10: qty 100

## 2023-07-10 MED ORDER — POTASSIUM CHLORIDE CRYS ER 20 MEQ PO TBCR
40.0000 meq | EXTENDED_RELEASE_TABLET | Freq: Once | ORAL | Status: AC
  Administered 2023-07-10: 40 meq via ORAL
  Filled 2023-07-10: qty 2

## 2023-07-10 MED ORDER — POTASSIUM CHLORIDE 10 MEQ/100ML IV SOLN: 10.0000 meq | INTRAVENOUS | Status: DC

## 2023-07-10 MED ORDER — POTASSIUM CHLORIDE IN NACL 20-0.9 MEQ/L-% IV SOLN
INTRAVENOUS | Status: AC
  Filled 2023-07-10 (×3): qty 1000

## 2023-07-10 MED ORDER — IOHEXOL 350 MG/ML SOLN
75.0000 mL | Freq: Once | INTRAVENOUS | Status: AC | PRN
  Administered 2023-07-10: 75 mL via INTRAVENOUS

## 2023-07-10 MED ORDER — MAGNESIUM SULFATE 2 GM/50ML IV SOLN
2.0000 g | Freq: Once | INTRAVENOUS | Status: DC
  Filled 2023-07-10: qty 50

## 2023-07-10 MED ORDER — SODIUM CHLORIDE 0.9 % IV SOLN
2.0000 g | INTRAVENOUS | Status: DC
  Administered 2023-07-11 – 2023-07-12 (×2): 2 g via INTRAVENOUS
  Filled 2023-07-10 (×2): qty 20

## 2023-07-10 MED ORDER — METRONIDAZOLE 500 MG/100ML IV SOLN
500.0000 mg | Freq: Two times a day (BID) | INTRAVENOUS | Status: DC
  Administered 2023-07-11 – 2023-07-12 (×4): 500 mg via INTRAVENOUS
  Filled 2023-07-10 (×4): qty 100

## 2023-07-10 MED ORDER — NICOTINE 14 MG/24HR TD PT24
14.0000 mg | MEDICATED_PATCH | Freq: Every day | TRANSDERMAL | Status: DC
  Administered 2023-07-11 – 2023-07-13 (×4): 14 mg via TRANSDERMAL
  Filled 2023-07-10 (×4): qty 1

## 2023-07-10 MED ORDER — LORAZEPAM 2 MG/ML IJ SOLN
1.0000 mg | Freq: Once | INTRAMUSCULAR | Status: AC
  Administered 2023-07-10: 1 mg via INTRAVENOUS
  Filled 2023-07-10: qty 1

## 2023-07-10 MED ORDER — ENOXAPARIN SODIUM 30 MG/0.3ML IJ SOSY
30.0000 mg | PREFILLED_SYRINGE | Freq: Every day | INTRAMUSCULAR | Status: DC
Start: 2023-07-11 — End: 2023-07-13
  Administered 2023-07-11 – 2023-07-13 (×3): 30 mg via SUBCUTANEOUS
  Filled 2023-07-10 (×3): qty 0.3

## 2023-07-10 MED ORDER — TRIMETHOBENZAMIDE HCL 100 MG/ML IM SOLN: 200.0000 mg | Freq: Three times a day (TID) | INTRAMUSCULAR | Status: DC | PRN

## 2023-07-10 MED ORDER — POTASSIUM CHLORIDE 10 MEQ/100ML IV SOLN
10.0000 meq | INTRAVENOUS | Status: AC
Start: 2023-07-10 — End: 2023-07-11
  Administered 2023-07-11 (×4): 10 meq via INTRAVENOUS
  Filled 2023-07-10 (×4): qty 100

## 2023-07-10 MED ORDER — ACETAMINOPHEN 650 MG RE SUPP: 650.0000 mg | Freq: Four times a day (QID) | RECTAL | Status: DC | PRN

## 2023-07-10 NOTE — ED Provider Notes (Signed)
[place] EMERGENCY DEPARTMENT AT [facility] Provider Note   CSN: [id number] Arrival date & time: 07/10/23  1533     History {Add pertinent medical, surgical, social history, OB history to HPI:1} Chief Complaint  Patient presents with   Chest Pain    [name] is a 52 y.o. female.  The history is provided by the patient and medical records.  Chest Pain Associated symptoms: abdominal pain, nausea and vomiting     52 year old female with history of abnormal LFTs, chronic pain, Crohn's disease, prolonged QT, anxiety, presenting to the ED with multiple complaints.  Patient reports nausea, vomiting, and diarrhea for a few days now.  States she continues trying to eat and drink but is not really able to hold anything down.  Diarrhea has been loose and watery, somewhat of a mucousy appearance.  Yesterday she began having chest pain, states more right-sided and somewhat into the right shoulder.  She has not had diaphoresis, shortness of breath, fever, or chills.  She states she overall feels very weak.  She also has concerns about her living situation.  She primarily lives alone, boyfriend of many years is in and out but not at home consistently.  States her mobility is getting worse and she continues losing weight.  She is concerned she may ultimately need placement into a facility.  Home Medications Prior to Admission medications   Medication Sig Start Date End Date Taking? Authorizing Provider  amLODipine (NORVASC) 10 MG tablet Take 1 tablet (10 mg total) by mouth daily. 05/30/23   Noralee Stain, DO  buprenorphine (SUBUTEX) 8 MG SUBL SL tablet Place 0.5 tablets (4 mg total) under the tongue 2 (two) times daily. 05/30/23     buprenorphine (SUBUTEX) 8 MG SUBL SL tablet DISSOLVE 1/2 TABLET UNDER THE TOUNGE TWICE A DAY 07/05/23     lisinopril (ZESTRIL) 20 MG tablet Take 1 tablet (20 mg total) by mouth daily. 05/30/23   Noralee Stain, DO  pravastatin (PRAVACHOL) 20 MG tablet  Take 1 tablet (20 mg total) by mouth daily. 03/13/23     traMADol (ULTRAM) 50 MG tablet Take 1-2 tablets (50-100 mg total) by mouth every 8 (eight) hours as needed. 03/01/23     traMADol (ULTRAM) 50 MG tablet Take 1-2 tablets (50-100 mg total) by mouth every 8 (eight) hours as needed. 05/30/23     traMADol (ULTRAM) 50 MG tablet Take 1-2 tablets (50-100 mg total) by mouth every 8 (eight) hours as needed. 07/05/23         Allergies    Penicillins    Review of Systems   Review of Systems  Cardiovascular:  Positive for chest pain.  Gastrointestinal:  Positive for abdominal pain, diarrhea, nausea and vomiting.  All other systems reviewed and are negative.   Physical Exam Updated Vital Signs BP (!) 147/94 (BP Location: Left Arm)   Pulse 77   Temp 98.8 F (37.1 C) (Oral)   Resp 19   Ht 5\' 6"  (1.676 m)   Wt 44.5 kg   SpO2 99%   BMI 15.82 kg/m  Physical Exam Vitals and nursing note reviewed.  Constitutional:      Appearance: She is well-developed.     Comments: Thin, underweight  HENT:     Head: Normocephalic and atraumatic.  Eyes:     Conjunctiva/sclera: Conjunctivae normal.     Pupils: Pupils are equal, round, and reactive to light.  Cardiovascular:     Rate and Rhythm: Normal rate and regular rhythm.  Heart sounds: Normal heart sounds.  Pulmonary:     Effort: Pulmonary effort is normal.     Breath sounds: Normal breath sounds.  Abdominal:     General: Bowel sounds are normal.     Palpations: Abdomen is soft.     Tenderness: There is abdominal tenderness in the periumbilical area and left lower quadrant.  Musculoskeletal:        General: Normal range of motion.     Cervical back: Normal range of motion.  Skin:    General: Skin is warm and dry.  Neurological:     Mental Status: She is alert and oriented to person, place, and time.     ED Results / Procedures / Treatments   Labs (all labs ordered are listed, but only abnormal results are displayed) Labs Reviewed   COMPREHENSIVE METABOLIC PANEL - Abnormal; Notable for the following components:      Result Value   Potassium 2.5 (*)    Chloride 97 (*)    Calcium 8.1 (*)    Total Protein 6.1 (*)    Albumin 3.0 (*)    AST 72 (*)    Alkaline Phosphatase 160 (*)    Total Bilirubin 1.4 (*)    Anion gap 16 (*)    All other components within normal limits  CBC WITH DIFFERENTIAL/PLATELET - Abnormal; Notable for the following components:   RBC 3.19 (*)    Hemoglobin 11.3 (*)    HCT 32.3 (*)    MCV 101.3 (*)    MCH 35.4 (*)    All other components within normal limits  MAGNESIUM - Abnormal; Notable for the following components:   Magnesium 1.0 (*)    All other components within normal limits  LIPASE, BLOOD  URINALYSIS, ROUTINE W REFLEX MICROSCOPIC  TROPONIN I (HIGH SENSITIVITY)  TROPONIN I (HIGH SENSITIVITY)    EKG None  Radiology CT ABDOMEN PELVIS W CONTRAST  Result Date: 07/10/2023 CLINICAL DATA:  Epigastric pain, nausea, vomiting EXAM: CT ABDOMEN AND PELVIS WITH CONTRAST TECHNIQUE: Multidetector CT imaging of the abdomen and pelvis was performed using the standard protocol following bolus administration of intravenous contrast. RADIATION DOSE REDUCTION: This exam was performed according to the departmental dose-optimization program which includes automated exposure control, adjustment of the mA and/or kV according to patient size and/or use of iterative reconstruction technique. CONTRAST:  75mL OMNIPAQUE IOHEXOL 350 MG/ML SOLN COMPARISON:  05/24/2023 FINDINGS: Lower chest: No acute abnormality Hepatobiliary: Diffuse low-density throughout the liver compatible with fatty infiltration. No focal abnormality. Gallbladder unremarkable. Mild dilatation of the common bile duct measuring up to 9 mm in the pancreatic head. No visible ductal stones. Pancreas: No focal abnormality or ductal dilatation. Spleen: No focal abnormality.  Normal size. Adrenals/Urinary Tract: No adrenal abnormality. No focal renal  abnormality. No stones or hydronephrosis. Urinary bladder is unremarkable. Stomach/Bowel: Wall thickening within the right colon and transverse colon compatible with infectious or inflammatory colitis. Stomach and small bowel decompressed, unremarkable. Vascular/Lymphatic: Aortic atherosclerosis. Reproductive: Uterus and adnexa unremarkable.  No mass. Other: No free fluid or free air. Musculoskeletal: No acute bony abnormality. IMPRESSION: Wall thickening in the right colon and transverse colon compatible with infectious or inflammatory colitis. Hepatic steatosis. Common bile duct mildly dilated at 9 mm.  No visible ductal stones. Aortic atherosclerosis. Electronically Signed   By: Charlett Nose M.D.   On: 07/10/2023 21:20   DG Chest 1 View  Result Date: 07/10/2023 CLINICAL DATA:  Chest pain. EXAM: CHEST  1 VIEW COMPARISON:  Chest  radiograph dated 05/24/2023. FINDINGS: The heart size and mediastinal contours are within normal limits. Both lungs are clear. The visualized skeletal structures are unremarkable. IMPRESSION: No active disease. Electronically Signed   By: Elgie Collard M.D.   On: 07/10/2023 19:10    Procedures Procedures  {Document cardiac monitor, telemetry assessment procedure when appropriate:1}  CRITICAL CARE Performed by: Garlon Hatchet   Total critical care time: 45 minutes  Critical care time was exclusive of separately billable procedures and treating other patients.  Critical care was necessary to treat or prevent imminent or life-threatening deterioration.  Critical care was time spent personally by me on the following activities: development of treatment plan with patient and/or surrogate as well as nursing, discussions with consultants, evaluation of patient's response to treatment, examination of patient, obtaining history from patient or surrogate, ordering and performing treatments and interventions, ordering and review of laboratory studies, ordering and review of  radiographic studies, pulse oximetry and re-evaluation of patient's condition.   Medications Ordered in ED Medications  potassium chloride 10 mEq in 100 mL IVPB (has no administration in time range)  magnesium sulfate IVPB 2 g 50 mL (has no administration in time range)  potassium chloride SA (KLOR-CON M) CR tablet 40 mEq (has no administration in time range)  cefTRIAXone (ROCEPHIN) 2 g in sodium chloride 0.9 % 100 mL IVPB (has no administration in time range)  metroNIDAZOLE (FLAGYL) IVPB 500 mg (has no administration in time range)  iohexol (OMNIPAQUE) 350 MG/ML injection 75 mL (75 mLs Intravenous Contrast Given 07/10/23 1837)    ED Course/ Medical Decision Making/ A&P   {   Click here for ABCD2, HEART and other calculatorsREFRESH Note before signing :1}                              Medical Decision Making Amount and/or Complexity of Data Reviewed Labs: ordered. Radiology: ordered and independent interpretation performed. ECG/medicine tests: ordered and independent interpretation performed.  Risk Prescription drug management.   52 year old female presenting to the ED with multiple concerns, notably abdominal discomfort, nausea, vomiting, diarrhea, and chest discomfort.  She is afebrile and nontoxic in appearance.  She is quite thin and appears underweight.  She does have some abdominal tenderness in her left lower quadrant and periumbilical region.  Workup initiated from triage and reviewed--no leukocytosis but does have several electrolyte derangements, notably potassium and magnesium low at 2.5 and 1.0 respectively.  She has abnormal LFTs and alk phos, however this is chronic and similar to prior values.  CT with findings of possible colitis, patient has history of same.  EKG with prolonged QT, also has history of same.  Discussed with pharmacy given this, recommended Rocephin Flagyl to avoid further QT prolongation given her penicillin allergy.  At this point, she will require  admission.  She is interested in speaking with social worker about possible placement options as she does not feel safe living alone any longer.  Final Clinical Impression(s) / ED Diagnoses Final diagnoses:  Colitis  Hypokalemia  Hypomagnesemia    Rx / DC Orders ED Discharge Orders     None

## 2023-07-10 NOTE — H&P (Signed)
PCP:   [facility]   Chief Complaint:  Nausea and vomiting  HPI: This is a 52 year old female with past medical history of IV heroine use now on Suboxone,alcohol use, HTN, Crohn's disease, OSA, depression, tobacco use, hypokalemia, hypophosphatemia, hypomagnesemia, prolonged QT.  She presents with complaint of 1 week nausea vomiting, inability to keep her meds down.  She endorses left-sided chest pain that wrapped around her back to the edge of her shoulder blades.  Pain occasionally in the right breast.  Her appetite has been poor.  She reports infrequent diarrhea and shortness of breath.  At baseline patient on the rollator.  She became too fatigued and came to the ER.  Per patient she has recurrent episodes of nausea vomiting.  Her last colonoscopy was 30 years ago.  Per patient her last EGD was last year. Patient reports at baseline she is quite fatigued to do much around the house.  She cannot stand for long and appears a bit overwhelmed.  In the ER patient's vital stable.  Potassium 2.5, magnesium 1.0.  CT abdomen and pelvis with contrast shows wall thickening in the right colon and transverse colon compatible with infectious or inflammatory colitis.  Patient treated with Rocephin and Flagyl.  Review of Systems:  Per HPI  Past Medical History: Past Medical History:  Diagnosis Date   Alcohol dependence in sustained full remission    Benign essential hypertension 04/09/2021   Chronic pain syndrome 04/09/2021   Crohn's disease    Dilated cbd, acquired 04/09/2021   Gallstone pancreatitis 04/09/2021   Generalized anxiety disorder    Gout 04/09/2021   History of heroin use    Kidney stones    Major depressive disorder    Obstructive sleep apnea    Past Surgical History:  Procedure Laterality Date   LEEP     LITHOTRIPSY     TUBAL LIGATION      Medications: Prior to Admission medications   Medication Sig Start Date End Date Taking? Authorizing Provider  amLODipine  (NORVASC) 10 MG tablet Take 1 tablet (10 mg total) by mouth daily. 05/30/23   Noralee Stain, DO  buprenorphine (SUBUTEX) 8 MG SUBL SL tablet Place 0.5 tablets (4 mg total) under the tongue 2 (two) times daily. 05/30/23     buprenorphine (SUBUTEX) 8 MG SUBL SL tablet DISSOLVE 1/2 TABLET UNDER THE TOUNGE TWICE A DAY 07/05/23     lisinopril (ZESTRIL) 20 MG tablet Take 1 tablet (20 mg total) by mouth daily. 05/30/23   Noralee Stain, DO  pravastatin (PRAVACHOL) 20 MG tablet Take 1 tablet (20 mg total) by mouth daily. 03/13/23     traMADol (ULTRAM) 50 MG tablet Take 1-2 tablets (50-100 mg total) by mouth every 8 (eight) hours as needed. 03/01/23     traMADol (ULTRAM) 50 MG tablet Take 1-2 tablets (50-100 mg total) by mouth every 8 (eight) hours as needed. 05/30/23     traMADol (ULTRAM) 50 MG tablet Take 1-2 tablets (50-100 mg total) by mouth every 8 (eight) hours as needed. 07/05/23       Allergies:   Allergies  Allergen Reactions   Penicillins Hives    Did it involve swelling of the face/tongue/throat, SOB, or low BP? No Did it involve sudden or severe rash/hives, skin peeling, or any reaction on the inside of your mouth or nose? Yes Did you need to seek medical attention at a hospital or doctor's office? Unknown When did it last happen?    childhood   If  all above answers are "NO", may proceed with cephalosporin use.      Social History:  reports that she has been smoking cigarettes. She has never used smokeless tobacco. She reports current alcohol use of about 4.0 - 5.0 standard drinks of alcohol per week. She reports that she does not currently use drugs.  Family History: Family History  Problem Relation Age of Onset   Thyroid disease Mother    Dementia Paternal Grandmother     Physical Exam: Vitals:   07/10/23 1537 07/10/23 1539 07/10/23 1908 07/10/23 2239  BP:  (!) 114/94 (!) 145/98 (!) 147/94  Pulse:  90 82 77  Resp:  (!) 22 16 19   Temp:  98.6 F (37 C) 99.4 F (37.4 C) 98.8 F  (37.1 C)  TempSrc:  Oral  Oral  SpO2:  100% 100% 99%  Weight: 44.5 kg     Height: 5\' 6"  (1.676 m)       General: A&O x 3, well developed and nourished, no acute distress Eyes: Pink conjunctiva, no scleral icterus ENT: Moist oral mucosa, neck supple, no thyromegaly Lungs: CTA B/L, no wheeze, no crackles, no use of accessory muscles Cardiovascular: RRR, no regurgitation, no bruits, no JVD Abdomen: soft, positive BS, mild nonspecific generalized TTP, not an acute abdomen GU: not examined Neuro: CN II - XII grossly intact, sensation intact Musculoskeletal: strength 5/5 all extremities, no edema Skin: no rash, no subcutaneous crepitation, no decubitus Psych: appropriate patient   Labs on Admission:  Recent Labs    07/10/23 1559  NA 138  K 2.5*  CL 97*  CO2 25  GLUCOSE 90  BUN 9  CREATININE 0.91  CALCIUM 8.1*  MG 1.0*   Recent Labs    07/10/23 1559  AST 72*  ALT 25  ALKPHOS 160*  BILITOT 1.4*  PROT 6.1*  ALBUMIN 3.0*   Recent Labs    07/10/23 1559  LIPASE 26   Recent Labs    07/10/23 1559  WBC 7.0  NEUTROABS 5.1  HGB 11.3*  HCT 32.3*  MCV 101.3*  PLT 388    Radiological Exams on Admission: CT ABDOMEN PELVIS W CONTRAST  Result Date: 07/10/2023 CLINICAL DATA:  Epigastric pain, nausea, vomiting EXAM: CT ABDOMEN AND PELVIS WITH CONTRAST TECHNIQUE: Multidetector CT imaging of the abdomen and pelvis was performed using the standard protocol following bolus administration of intravenous contrast. RADIATION DOSE REDUCTION: This exam was performed according to the departmental dose-optimization program which includes automated exposure control, adjustment of the mA and/or kV according to patient size and/or use of iterative reconstruction technique. CONTRAST:  75mL OMNIPAQUE IOHEXOL 350 MG/ML SOLN COMPARISON:  05/24/2023 FINDINGS: Lower chest: No acute abnormality Hepatobiliary: Diffuse low-density throughout the liver compatible with fatty infiltration. No focal  abnormality. Gallbladder unremarkable. Mild dilatation of the common bile duct measuring up to 9 mm in the pancreatic head. No visible ductal stones. Pancreas: No focal abnormality or ductal dilatation. Spleen: No focal abnormality.  Normal size. Adrenals/Urinary Tract: No adrenal abnormality. No focal renal abnormality. No stones or hydronephrosis. Urinary bladder is unremarkable. Stomach/Bowel: Wall thickening within the right colon and transverse colon compatible with infectious or inflammatory colitis. Stomach and small bowel decompressed, unremarkable. Vascular/Lymphatic: Aortic atherosclerosis. Reproductive: Uterus and adnexa unremarkable.  No mass. Other: No free fluid or free air. Musculoskeletal: No acute bony abnormality. IMPRESSION: Wall thickening in the right colon and transverse colon compatible with infectious or inflammatory colitis. Hepatic steatosis. Common bile duct mildly dilated at 9 mm.  No visible  ductal stones. Aortic atherosclerosis. Electronically Signed   By: Charlett Nose M.D.   On: 07/10/2023 21:20   DG Chest 1 View  Result Date: 07/10/2023 CLINICAL DATA:  Chest pain. EXAM: CHEST  1 VIEW COMPARISON:  Chest radiograph dated 05/24/2023. FINDINGS: The heart size and mediastinal contours are within normal limits. Both lungs are clear. The visualized skeletal structures are unremarkable. IMPRESSION: No active disease. Electronically Signed   By: Elgie Collard M.D.   On: 07/10/2023 19:10    Assessment/Plan Present on Admission:  Colitis //  Nausea vomiting and diarrhea  Crohn's disease -GI panel sent.  No leukocytosis, C. difficile not sent -Antiemetics as needed Compazine -Continue Rocephin and Flagyl -ESR, CRP ordered -IV fluid hydration   Hypomagnesemia //  Hypokalemia -Electrolyte imbalance secondary to GI losses -4 g IV mag ordered -K riders x 6. -IV fluid hydration.  Repeat labs in a.m.   Prolonged QT interval -Secondary to electrolyte imbalance.  Repeat EKG in  a.m. after electrolytes normalized -Goal potassium 4.0.  Magnesium 2   Benign essential hypertension -Home medication Norvasc, lisinopril   Tobacco use -Nicotine patch ordered   Chronic pain syndrome//history of illicit use -Subutex   Generalized anxiety disorder  Major depressive disorder  [name] 07/10/2023, 11:35 PM

## 2023-07-10 NOTE — ED Provider Triage Note (Signed)
Emergency Medicine Provider Triage Evaluation Note  [name] , a 52 y.o. female  was evaluated in triage.  Pt complains of chest pain nausea vomiting.  Chest pain began yesterday and has persisted since the onset localized over the left chest with radiation to the left shoulder.  Nausea vomiting unable to keep anything down even water.  Recurrent abdominal pain over the last few months.  Denies fevers and chills  Review of Systems  Positive: Chest pain abdominal pain nausea vomiting Negative: Fevers chills  Physical Exam  BP (!) 114/94 (BP Location: Left Arm)   Pulse 90   Temp 98.6 F (37 C) (Oral)   Resp (!) 22   Ht 5\' 6"  (1.676 m)   Wt 44.5 kg   SpO2 100%   BMI 15.82 kg/m  Gen:   Awake, no distress Resp:  Normal effort clear to auscultation bilaterally Abdominal: Epigastric tenderness with no rebound rigidity or guarding MSK:   Moves extremities without difficulty Other:  Medical Decision Making  Medically screening exam initiated at 3:51 PM.  Appropriate orders placed.  [name] was informed that the remainder of the evaluation will be completed by another provider, this initial triage assessment does not replace that evaluation, and the importance of remaining in the ED until their evaluation is complete.     [name], DO 07/10/23 1557

## 2023-07-10 NOTE — ED Notes (Signed)
Date and time results received: 07/10/23 4:51 PM  (use smartphrase ".now" to insert current time)  Test: K+ Critical Value: 2.5  Name of Provider Notified: Tiburcio Pea, [place]  Orders Received? Or Actions Taken?: see chart

## 2023-07-10 NOTE — ED Triage Notes (Signed)
Pt with n/v x 2 days, and chest pain since yesterday. Yesterday pain was sharp but today has L sided chest pressure that radiates to L shoulder blade. 324 ASA and 4mg  Zofran given by EMS.

## 2023-07-11 LAB — CBC WITH DIFFERENTIAL/PLATELET
Abs Immature Granulocytes: 0.03 10*3/uL (ref 0.00–0.07)
Basophils Absolute: 0 10*3/uL (ref 0.0–0.1)
Basophils Relative: 0 %
Eosinophils Absolute: 0.1 10*3/uL (ref 0.0–0.5)
Eosinophils Relative: 2 %
HCT: 26.8 % — ABNORMAL LOW (ref 36.0–46.0)
Hemoglobin: 9.2 g/dL — ABNORMAL LOW (ref 12.0–15.0)
Immature Granulocytes: 1 %
Lymphocytes Relative: 22 %
Lymphs Abs: 1.4 10*3/uL (ref 0.7–4.0)
MCH: 35.7 pg — ABNORMAL HIGH (ref 26.0–34.0)
MCHC: 34.3 g/dL (ref 30.0–36.0)
MCV: 103.9 fL — ABNORMAL HIGH (ref 80.0–100.0)
Monocytes Absolute: 0.6 10*3/uL (ref 0.1–1.0)
Monocytes Relative: 9 %
Neutro Abs: 4.1 10*3/uL (ref 1.7–7.7)
Neutrophils Relative %: 66 %
Platelets: 334 10*3/uL (ref 150–400)
RBC: 2.58 MIL/uL — ABNORMAL LOW (ref 3.87–5.11)
RDW: 15.2 % (ref 11.5–15.5)
WBC: 6.2 10*3/uL (ref 4.0–10.5)
nRBC: 0 % (ref 0.0–0.2)

## 2023-07-11 LAB — CBC
HCT: 33.2 % — ABNORMAL LOW (ref 36.0–46.0)
Hemoglobin: 11.6 g/dL — ABNORMAL LOW (ref 12.0–15.0)
MCH: 36.5 pg — ABNORMAL HIGH (ref 26.0–34.0)
MCHC: 34.9 g/dL (ref 30.0–36.0)
MCV: 104.4 fL — ABNORMAL HIGH (ref 80.0–100.0)
Platelets: 377 10*3/uL (ref 150–400)
RBC: 3.18 MIL/uL — ABNORMAL LOW (ref 3.87–5.11)
RDW: 15.3 % (ref 11.5–15.5)
WBC: 6.5 10*3/uL (ref 4.0–10.5)
nRBC: 0 % (ref 0.0–0.2)

## 2023-07-11 LAB — BASIC METABOLIC PANEL
Anion gap: 9 (ref 5–15)
BUN: 8 mg/dL (ref 6–20)
CO2: 26 mmol/L (ref 22–32)
Calcium: 7.2 mg/dL — ABNORMAL LOW (ref 8.9–10.3)
Chloride: 102 mmol/L (ref 98–111)
Creatinine, Ser: 0.71 mg/dL (ref 0.44–1.00)
GFR, Estimated: >60 mL/min (ref >60)
Glucose, Bld: 70 mg/dL (ref 70–99)
Potassium: 3.9 mmol/L (ref 3.5–5.1)
Sodium: 137 mmol/L (ref 135–145)

## 2023-07-11 LAB — CREATININE, SERUM
Creatinine, Ser: 0.9 mg/dL (ref 0.44–1.00)
GFR, Estimated: >60 mL/min (ref >60)

## 2023-07-11 LAB — PHOSPHORUS: Phosphorus: 2.9 mg/dL (ref 2.5–4.6)

## 2023-07-11 LAB — C-REACTIVE PROTEIN: CRP: 0.5 mg/dL (ref <1.0)

## 2023-07-11 LAB — MAGNESIUM: Magnesium: 2.6 mg/dL — ABNORMAL HIGH (ref 1.7–2.4)

## 2023-07-11 LAB — SEDIMENTATION RATE: Sed Rate: 7 mm/h (ref 0–22)

## 2023-07-11 MED ORDER — PRAVASTATIN SODIUM 40 MG PO TABS
20.0000 mg | ORAL_TABLET | Freq: Every day | ORAL | Status: DC
  Administered 2023-07-11 – 2023-07-13 (×3): 20 mg via ORAL
  Filled 2023-07-11 (×3): qty 1

## 2023-07-11 MED ORDER — MORPHINE SULFATE (PF) 2 MG/ML IV SOLN
2.0000 mg | INTRAVENOUS | Status: AC | PRN
  Administered 2023-07-11 (×2): 2 mg via INTRAVENOUS
  Filled 2023-07-11 (×2): qty 1

## 2023-07-11 MED ORDER — BUPRENORPHINE HCL 2 MG SL SUBL
4.0000 mg | SUBLINGUAL_TABLET | Freq: Two times a day (BID) | SUBLINGUAL | Status: DC
  Administered 2023-07-11 (×2): 4 mg via SUBLINGUAL
  Filled 2023-07-11 (×2): qty 2

## 2023-07-11 NOTE — ED Notes (Signed)
ED TO INPATIENT HANDOFF REPORT  ED Nurse Name and Phone #: [phone]  S Name/Age/Gender [name] 52 y.o. female Room/Bed: 045C/045C  Code Status   Code Status: Full Code  Home/SNF/Other Home Patient oriented to: self, place, time, situation Is this baseline? Yes   Triage Complete: Triage complete  Chief Complaint Hypomagnesemia [E83.42]  Triage Note Pt with n/v x 2 days, and chest pain since yesterday. Yesterday pain was sharp but today has L sided chest pressure that radiates to L shoulder blade. 324 ASA and 4mg  Zofran given by EMS.    Allergies Allergies  Allergen Reactions   Penicillins Hives    Did it involve swelling of the face/tongue/throat, SOB, or low BP? No Did it involve sudden or severe rash/hives, skin peeling, or any reaction on the inside of your mouth or nose? Yes Did you need to seek medical attention at a hospital or doctor's office? Unknown When did it last happen?    childhood   If all above answers are "NO", may proceed with cephalosporin use.      Level of Care/Admitting Diagnosis ED Disposition     ED Disposition  Admit   Condition  --   Comment  Hospital Area: [facility] [[id number]]  Level of Care: Telemetry Medical [104]  May admit patient to [facility] or [facility] if equivalent level of care is available:: Yes  Covid Evaluation: Asymptomatic - no recent exposure (last 10 days) testing not required  Diagnosis: Hypomagnesemia [478295]  Admitting Physician: Gery Pray [[id number]]  Attending Physician: Gery Pray [[id number]]  Certification:: I certify this patient will need inpatient services for at least 2 midnights  Expected Medical Readiness: 07/12/2023          B Medical/Surgery History Past Medical History:  Diagnosis Date   Alcohol dependence in sustained full remission    Benign essential hypertension 04/09/2021   Chronic pain syndrome 04/09/2021   Crohn's disease    Dilated cbd, acquired  04/09/2021   Gallstone pancreatitis 04/09/2021   Generalized anxiety disorder    Gout 04/09/2021   History of heroin use    Kidney stones    Major depressive disorder    Obstructive sleep apnea    Past Surgical History:  Procedure Laterality Date   LEEP     LITHOTRIPSY     TUBAL LIGATION       A IV Location/Drains/Wounds Patient Lines/Drains/Airways Status     Active Line/Drains/Airways     Name Placement date Placement time Site Days   Peripheral IV 07/10/23 20 G Distal;Posterior;Right Forearm 07/10/23  1530  Forearm  1   Peripheral IV 07/11/23 20 G 1" Anterior;Left Forearm 07/11/23  0059  Forearm  less than 1            Intake/Output Last 24 hours  Intake/Output Summary (Last 24 hours) at 07/11/2023 0220 Last data filed at 07/10/2023 2332 Gross per 24 hour  Intake 100 ml  Output --  Net 100 ml    Labs/Imaging Results for orders placed or performed during the hospital encounter of 07/10/23 (from the past 48 hour(s))  Comprehensive metabolic panel     Status: Abnormal   Collection Time: 07/10/23  3:59 PM  Result Value Ref Range   Sodium 138 135 - 145 mmol/L   Potassium 2.5 (LL) 3.5 - 5.1 mmol/L    Comment: CRITICAL RESULT CALLED TO, READ BACK BY AND VERIFIED WITH M DOSS,RN 1651 07/10/2023 WBOND   Chloride 97 (L) 98 -  111 mmol/L   CO2 25 22 - 32 mmol/L   Glucose, Bld 90 70 - 99 mg/dL    Comment: Glucose reference range applies only to samples taken after fasting for at least 8 hours.   BUN 9 6 - 20 mg/dL   Creatinine, Ser 1.61 0.44 - 1.00 mg/dL   Calcium 8.1 (L) 8.9 - 10.3 mg/dL   Total Protein 6.1 (L) 6.5 - 8.1 g/dL   Albumin 3.0 (L) 3.5 - 5.0 g/dL   AST 72 (H) 15 - 41 U/L   ALT 25 0 - 44 U/L   Alkaline Phosphatase 160 (H) 38 - 126 U/L   Total Bilirubin 1.4 (H) <1.2 mg/dL   GFR, Estimated >09 >60 mL/min    Comment: (NOTE) Calculated using the CKD-EPI Creatinine Equation (2021)    Anion gap 16 (H) 5 - 15    Comment: Performed at [facility]  Lab, [address] [place], [place] [address]  CBC with Differential     Status: Abnormal   Collection Time: 07/10/23  3:59 PM  Result Value Ref Range   WBC 7.0 4.0 - 10.5 K/uL   RBC 3.19 (L) 3.87 - 5.11 MIL/uL   Hemoglobin 11.3 (L) 12.0 - 15.0 g/dL   HCT 81.1 (L) 91.4 - 78.2 %   MCV 101.3 (H) 80.0 - 100.0 fL   MCH 35.4 (H) 26.0 - 34.0 pg   MCHC 35.0 30.0 - 36.0 g/dL   RDW 95.6 21.3 - 08.6 %   Platelets 388 150 - 400 K/uL   nRBC 0.0 0.0 - 0.2 %   Neutrophils Relative % 74 %   Neutro Abs 5.1 1.7 - 7.7 K/uL   Lymphocytes Relative 19 %   Lymphs Abs 1.3 0.7 - 4.0 K/uL   Monocytes Relative 7 %   Monocytes Absolute 0.5 0.1 - 1.0 K/uL   Eosinophils Relative 0 %   Eosinophils Absolute 0.0 0.0 - 0.5 K/uL   Basophils Relative 0 %   Basophils Absolute 0.0 0.0 - 0.1 K/uL   Immature Granulocytes 0 %   Abs Immature Granulocytes 0.01 0.00 - 0.07 K/uL    Comment: Performed at [facility], [address] [place], [place] [address]  Troponin I (High Sensitivity)     Status: None   Collection Time: 07/10/23  3:59 PM  Result Value Ref Range   Troponin I (High Sensitivity) 12 <18 ng/L    Comment: (NOTE) Elevated high sensitivity troponin I (hsTnI) values and significant  changes across serial measurements may suggest ACS but many other  chronic and acute conditions are known to elevate hsTnI results.  Refer to the "Links" section for chest pain algorithms and additional  guidance. Performed at [facility], [address], [place], [place] [address]   Magnesium     Status: Abnormal   Collection Time: 07/10/23  3:59 PM  Result Value Ref Range   Magnesium 1.0 (L) 1.7 - 2.4 mg/dL    Comment: Performed at [facility], [address], [place], [place] [address]  Lipase, blood     Status: None   Collection Time: 07/10/23  3:59 PM  Result Value Ref Range   Lipase 26 11 - 51 U/L    Comment: Performed at [facility], [address], [place], [place] [address]  Troponin I  (High Sensitivity)     Status: None   Collection Time: 07/10/23 10:56 PM  Result Value Ref Range   Troponin  I (High Sensitivity) 13 <18 ng/L    Comment: (NOTE) Elevated high sensitivity troponin I (hsTnI) values and significant  changes across serial measurements may suggest ACS but many other  chronic and acute conditions are known to elevate hsTnI results.  Refer to the "Links" section for chest pain algorithms and additional  guidance. Performed at [facility], [address] [place], [place] [address]   Phosphorus     Status: None   Collection Time: 07/10/23 10:56 PM  Result Value Ref Range   Phosphorus 2.9 2.5 - 4.6 mg/dL    Comment: Performed at [facility], [address], [place], [place] [address]  CBC     Status: Abnormal   Collection Time: 07/11/23 12:45 AM  Result Value Ref Range   WBC 6.5 4.0 - 10.5 K/uL   RBC 3.18 (L) 3.87 - 5.11 MIL/uL   Hemoglobin 11.6 (L) 12.0 - 15.0 g/dL   HCT 66.4 (L) 40.3 - 47.4 %   MCV 104.4 (H) 80.0 - 100.0 fL   MCH 36.5 (H) 26.0 - 34.0 pg   MCHC 34.9 30.0 - 36.0 g/dL   RDW 25.9 56.3 - 87.5 %   Platelets 377 150 - 400 K/uL   nRBC 0.0 0.0 - 0.2 %    Comment: Performed at [facility] Lab, [address], [place], [place] [address]  Creatinine, serum     Status: None   Collection Time: 07/11/23 12:45 AM  Result Value Ref Range   Creatinine, Ser 0.90 0.44 - 1.00 mg/dL   GFR, Estimated >95 >18 mL/min    Comment: (NOTE) Calculated using the CKD-EPI Creatinine Equation (2021) Performed at [facility] Lab, [address], [place], [place] [address]    CT ABDOMEN PELVIS W CONTRAST  Result Date: 07/10/2023 CLINICAL DATA:  Epigastric pain, nausea, vomiting EXAM: CT ABDOMEN AND PELVIS WITH CONTRAST TECHNIQUE: Multidetector CT imaging of the abdomen and pelvis was performed using the standard protocol following bolus administration of intravenous contrast. RADIATION DOSE REDUCTION: This exam was performed according to the  departmental dose-optimization program which includes automated exposure control, adjustment of the mA and/or kV according to patient size and/or use of iterative reconstruction technique. CONTRAST:  75mL OMNIPAQUE IOHEXOL 350 MG/ML SOLN COMPARISON:  05/24/2023 FINDINGS: Lower chest: No acute abnormality Hepatobiliary: Diffuse low-density throughout the liver compatible with fatty infiltration. No focal abnormality. Gallbladder unremarkable. Mild dilatation of the common bile duct measuring up to 9 mm in the pancreatic head. No visible ductal stones. Pancreas: No focal abnormality or ductal dilatation. Spleen: No focal abnormality.  Normal size. Adrenals/Urinary Tract: No adrenal abnormality. No focal renal abnormality. No stones or hydronephrosis. Urinary bladder is unremarkable. Stomach/Bowel: Wall thickening within the right colon and transverse colon compatible with infectious or inflammatory colitis. Stomach and small bowel decompressed, unremarkable. Vascular/Lymphatic: Aortic atherosclerosis. Reproductive: Uterus and adnexa unremarkable.  No mass. Other: No free fluid or free air. Musculoskeletal: No acute bony abnormality. IMPRESSION: Wall thickening in the right colon and transverse colon compatible with infectious or inflammatory colitis. Hepatic steatosis. Common bile duct mildly dilated at 9 mm.  No visible ductal stones. Aortic atherosclerosis. Electronically Signed   By: Charlett Nose M.D.   On: 07/10/2023 21:20   DG Chest 1 View  Result Date: 07/10/2023 CLINICAL DATA:  Chest pain. EXAM: CHEST  1 VIEW COMPARISON:  Chest radiograph dated 05/24/2023. FINDINGS: The heart size and mediastinal contours are within normal limits. Both lungs are clear. The visualized skeletal  structures are unremarkable. IMPRESSION: No active disease. Electronically Signed   By: Elgie Collard M.D.   On: 07/10/2023 19:10    Pending Labs Unresulted Labs (From admission, onward)     Start     Ordered   07/17/23 0500   Creatinine, serum  (enoxaparin (LOVENOX)    CrCl < 30 ml/min)  Once,   R       Comments: while on enoxaparin therapy.    07/10/23 2346   07/10/23 1555  Urinalysis, Routine w reflex microscopic -Urine, Clean Catch  Once,   URGENT       Question:  Specimen Source  Answer:  Urine, Clean Catch   07/10/23 1554            Vitals/Pain Today's Vitals   07/10/23 2351 07/11/23 0000 07/11/23 0015 07/11/23 0030  BP:  (!) 125/94 (!) 139/117 (!) 140/94  Pulse:      Resp:  19 14 20   Temp:      TempSrc:      SpO2:      Weight:      Height:      PainSc: 2        Isolation Precautions No active isolations  Medications Medications  magnesium sulfate IVPB 4 g 100 mL (4 g Intravenous New Bag/Given 07/11/23 0038)  potassium chloride 10 mEq in 100 mL IVPB (10 mEq Intravenous New Bag/Given 07/11/23 0216)  0.9 % NaCl with KCl 20 mEq/ L  infusion ( Intravenous New Bag/Given 07/11/23 0103)  prochlorperazine (COMPAZINE) injection 10 mg (10 mg Intravenous Given 07/11/23 0059)  trimethobenzamide (TIGAN) injection 200 mg (has no administration in time range)  cefTRIAXone (ROCEPHIN) 2 g in sodium chloride 0.9 % 100 mL IVPB (has no administration in time range)  metroNIDAZOLE (FLAGYL) IVPB 500 mg (has no administration in time range)  enoxaparin (LOVENOX) injection 30 mg (has no administration in time range)  acetaminophen (TYLENOL) tablet 650 mg (has no administration in time range)    Or  acetaminophen (TYLENOL) suppository 650 mg (has no administration in time range)  nicotine (NICODERM CQ - dosed in mg/24 hours) patch 14 mg (14 mg Transdermal Patch Applied 07/11/23 0129)  amLODipine (NORVASC) tablet 10 mg (has no administration in time range)  iohexol (OMNIPAQUE) 350 MG/ML injection 75 mL (75 mLs Intravenous Contrast Given 07/10/23 1837)  potassium chloride SA (KLOR-CON M) CR tablet 40 mEq (40 mEq Oral Given 07/10/23 2306)  cefTRIAXone (ROCEPHIN) 2 g in sodium chloride 0.9 % 100 mL IVPB (0 g  Intravenous Stopped 07/10/23 2332)  metroNIDAZOLE (FLAGYL) IVPB 500 mg (0 mg Intravenous Stopped 07/11/23 0038)  LORazepam (ATIVAN) injection 1 mg (1 mg Intravenous Given 07/10/23 2328)    Mobility walks with device     Focused Assessments Cardiac Assessment Handoff:  Cardiac Rhythm: Normal sinus rhythm Lab Results  Component Value Date   CKTOTAL 20 11/27/2007   CKMB 0.3 11/27/2007   TROPONINI 0.01        NO INDICATION OF MYOCARDIAL INJURY. 11/27/2007   No results found for: "DDIMER" Does the Patient currently have chest pain? No    R Recommendations: See Admitting Provider Note  Report given to:   Additional Notes: patient uses a cane at home, she requires standby assist here due to weakness, she is A&O X4, please call with any questions

## 2023-07-11 NOTE — ED Notes (Signed)
Patient transferred in stable condition to the floor, via stretcher, by ER Tech with portable tele monitor in use.

## 2023-07-11 NOTE — ED Notes (Signed)
Patient

## 2023-07-11 NOTE — Progress Notes (Signed)
PROGRESS NOTE    [name]  LOV:[id number] DOB: 10/27/1970 DOA: 07/10/2023 PCP: [facility]   Brief Narrative:  This is a 52 year old female with past medical history of IV heroine use now on Suboxone,alcohol use, HTN, Crohn's disease, OSA, depression, tobacco use, hypokalemia, hypophosphatemia, hypomagnesemia, prolonged QT.  She presents with complaint of 1 week nausea vomiting, inability to keep her meds down.  She endorses left-sided chest pain that wrapped around her back to the edge of her shoulder blades.  Pain occasionally in the right breast.  Her appetite has been poor.  She reports infrequent diarrhea and shortness of breath.  At baseline patient on the rollator.  She became too fatigued and came to the ER.  Per patient she has recurrent episodes of nausea vomiting.  Her last colonoscopy was 30 years ago.  Per patient her last EGD was last year. Patient reports at baseline she is quite fatigued to do much around the house.  She cannot stand for long and appears a bit overwhelmed.   In the ER patient's vital stable.  Potassium 2.5, magnesium 1.0.  CT abdomen and pelvis with contrast shows wall thickening in the right colon and transverse colon compatible with infectious or inflammatory colitis.  Patient treated with Rocephin and Flagyl.  Assessment & Plan:   Principal Problem:   Hypomagnesemia Active Problems:   Chronic pain syndrome   Benign essential hypertension   Nausea vomiting and diarrhea   Generalized anxiety disorder   Crohn's disease   Obstructive sleep apnea   Major depressive disorder   Hypokalemia   Malnutrition of moderate degree   Hypophosphatemia   Prolonged QT interval   Tobacco use   Colitis  Acute colitis/nausea and vomiting: CT abdomen consistent with right ascending and transverse colon colitis.  Patient denies any diarrhea or abdominal pain, only complaints were nausea and vomiting.  Continue Rocephin and Flagyl.  Will start on clear  liquid diet now and advance to full liquid diet in the afternoon if she tolerates.  Severe hypomagnesemia/hypokalemia: Replenished yesterday, this was likely due to GI loss.  Repeating labs now and will replenish as indicated.  Prolonged QT interval: Secondary to electrolyte imbalance.  Plan to maintain electrolytes within normal range.  Benign essential hypertension: Blood pressure at low normal side at the moment, continue Norvasc but hold lisinopril.  Tobacco abuse/dependence: Nicotine patch ordered.  I have discussed tobacco cessation with the patient.  I have counseled the patient regarding the negative impacts of continued tobacco use including but not limited to lung cancer, COPD, and cardiovascular disease.  I have discussed alternatives to tobacco and modalities that may help facilitate tobacco cessation including but not limited to biofeedback, hypnosis, and medications.  Total time spent with tobacco counseling was 5 minutes.  Chronic pain syndrome/history of illicit use: Continue Subutex.  Hyperlipidemia: Resume pravastatin.  DVT prophylaxis: enoxaparin (LOVENOX) injection 30 mg Start: 07/11/23 1000   Code Status: Full Code  Family Communication:  None present at bedside.  Plan of care discussed with patient in length and he/she verbalized understanding and agreed with it.  Status is: Inpatient Remains inpatient appropriate because: Needs to advance diet slowly and needs IV antibiotics for another day.   Estimated body mass index is 15.82 kg/m as calculated from the following:   Height as of this encounter: 5\' 6"  (1.676 m).   Weight as of this encounter: 44.5 kg.    Nutritional Assessment: Body mass index is 15.82 kg/m.Marland Kitchen Seen by dietician.  I agree with the assessment and plan as outlined below: Nutrition Status:        . Skin Assessment: I have examined the patient's skin and I agree with the wound assessment as performed by the wound care RN as outlined below:     Consultants:  None  Procedures:  None  Antimicrobials:  Anti-infectives (From admission, onward)    Start     Dose/Rate Route Frequency Ordered Stop   07/11/23 2200  cefTRIAXone (ROCEPHIN) 2 g in sodium chloride 0.9 % 100 mL IVPB        2 g 200 mL/hr over 30 Minutes Intravenous Every 24 hours 07/10/23 2342     07/11/23 1000  metroNIDAZOLE (FLAGYL) IVPB 500 mg        500 mg 100 mL/hr over 60 Minutes Intravenous Every 12 hours 07/10/23 2343     07/10/23 2300  cefTRIAXone (ROCEPHIN) 2 g in sodium chloride 0.9 % 100 mL IVPB        2 g 200 mL/hr over 30 Minutes Intravenous  Once 07/10/23 2253 07/10/23 2332   07/10/23 2300  metroNIDAZOLE (FLAGYL) IVPB 500 mg        500 mg 100 mL/hr over 60 Minutes Intravenous  Once 07/10/23 2253 07/11/23 0038         Subjective: Patient seen and examined.  She states that her symptoms are resolving, denies any abdominal pain or diarrhea.  She wants to try to eat something.  Objective: Vitals:   07/11/23 0250 07/11/23 0300 07/11/23 0352 07/11/23 0805  BP: 118/80 (!) 143/92 136/85 108/78  Pulse:  80 71 73  Resp: 15 16 18 17   Temp:    97.7 F (36.5 C)  TempSrc:      SpO2: 96% 100% 98% 99%  Weight:      Height:        Intake/Output Summary (Last 24 hours) at 07/11/2023 0917 Last data filed at 07/10/2023 2332 Gross per 24 hour  Intake 100 ml  Output --  Net 100 ml   Filed Weights   07/10/23 1537  Weight: 44.5 kg    Examination:  General exam: Appears calm and comfortable  Respiratory system: Clear to auscultation. Respiratory effort normal. Cardiovascular system: S1 & S2 heard, RRR. No JVD, murmurs, rubs, gallops or clicks. No pedal edema. Gastrointestinal system: Abdomen is nondistended, soft and tenderness at most of the abdomen, epigastrium, right upper quadrant, right lower quadrant and periumbilical area. No organomegaly or masses felt. Normal bowel sounds heard. Central nervous system: Alert and oriented. No focal  neurological deficits. Extremities: Symmetric 5 x 5 power. Skin: No rashes, lesions or ulcers Psychiatry: Judgement and insight appear normal. Mood & affect appropriate.    Data Reviewed: I have personally reviewed following labs and imaging studies  CBC: Recent Labs  Lab 07/10/23 1559 07/11/23 0045  WBC 7.0 6.5  NEUTROABS 5.1  --   HGB 11.3* 11.6*  HCT 32.3* 33.2*  MCV 101.3* 104.4*  PLT 388 377   Basic Metabolic Panel: Recent Labs  Lab 07/10/23 1559 07/10/23 2256 07/11/23 0045  NA 138  --   --   K 2.5*  --   --   CL 97*  --   --   CO2 25  --   --   GLUCOSE 90  --   --   BUN 9  --   --   CREATININE 0.91  --  0.90  CALCIUM 8.1*  --   --   MG 1.0*  --   --  PHOS  --  2.9  --    GFR: Estimated Creatinine Clearance: 50.8 mL/min (by C-G formula based on SCr of 0.9 mg/dL). Liver Function Tests: Recent Labs  Lab 07/10/23 1559  AST 72*  ALT 25  ALKPHOS 160*  BILITOT 1.4*  PROT 6.1*  ALBUMIN 3.0*   Recent Labs  Lab 07/10/23 1559  LIPASE 26   No results for input(s): "AMMONIA" in the last 168 hours. Coagulation Profile: No results for input(s): "INR", "PROTIME" in the last 168 hours. Cardiac Enzymes: No results for input(s): "CKTOTAL", "CKMB", "CKMBINDEX", "TROPONINI" in the last 168 hours. BNP (last 3 results) No results for input(s): "PROBNP" in the last 8760 hours. HbA1C: No results for input(s): "HGBA1C" in the last 72 hours. CBG: No results for input(s): "GLUCAP" in the last 168 hours. Lipid Profile: No results for input(s): "CHOL", "HDL", "LDLCALC", "TRIG", "CHOLHDL", "LDLDIRECT" in the last 72 hours. Thyroid Function Tests: No results for input(s): "TSH", "T4TOTAL", "FREET4", "T3FREE", "THYROIDAB" in the last 72 hours. Anemia Panel: No results for input(s): "VITAMINB12", "FOLATE", "FERRITIN", "TIBC", "IRON", "RETICCTPCT" in the last 72 hours. Sepsis Labs: No results for input(s): "PROCALCITON", "LATICACIDVEN" in the last 168 hours.  No results  found for this or any previous visit (from the past 240 hour(s)).   Radiology Studies: CT ABDOMEN PELVIS W CONTRAST  Result Date: 07/10/2023 CLINICAL DATA:  Epigastric pain, nausea, vomiting EXAM: CT ABDOMEN AND PELVIS WITH CONTRAST TECHNIQUE: Multidetector CT imaging of the abdomen and pelvis was performed using the standard protocol following bolus administration of intravenous contrast. RADIATION DOSE REDUCTION: This exam was performed according to the departmental dose-optimization program which includes automated exposure control, adjustment of the mA and/or kV according to patient size and/or use of iterative reconstruction technique. CONTRAST:  75mL OMNIPAQUE IOHEXOL 350 MG/ML SOLN COMPARISON:  05/24/2023 FINDINGS: Lower chest: No acute abnormality Hepatobiliary: Diffuse low-density throughout the liver compatible with fatty infiltration. No focal abnormality. Gallbladder unremarkable. Mild dilatation of the common bile duct measuring up to 9 mm in the pancreatic head. No visible ductal stones. Pancreas: No focal abnormality or ductal dilatation. Spleen: No focal abnormality.  Normal size. Adrenals/Urinary Tract: No adrenal abnormality. No focal renal abnormality. No stones or hydronephrosis. Urinary bladder is unremarkable. Stomach/Bowel: Wall thickening within the right colon and transverse colon compatible with infectious or inflammatory colitis. Stomach and small bowel decompressed, unremarkable. Vascular/Lymphatic: Aortic atherosclerosis. Reproductive: Uterus and adnexa unremarkable.  No mass. Other: No free fluid or free air. Musculoskeletal: No acute bony abnormality. IMPRESSION: Wall thickening in the right colon and transverse colon compatible with infectious or inflammatory colitis. Hepatic steatosis. Common bile duct mildly dilated at 9 mm.  No visible ductal stones. Aortic atherosclerosis. Electronically Signed   By: Charlett Nose M.D.   On: 07/10/2023 21:20   DG Chest 1 View  Result  Date: 07/10/2023 CLINICAL DATA:  Chest pain. EXAM: CHEST  1 VIEW COMPARISON:  Chest radiograph dated 05/24/2023. FINDINGS: The heart size and mediastinal contours are within normal limits. Both lungs are clear. The visualized skeletal structures are unremarkable. IMPRESSION: No active disease. Electronically Signed   By: Elgie Collard M.D.   On: 07/10/2023 19:10    Scheduled Meds:  amLODipine  10 mg Oral Daily   enoxaparin (LOVENOX) injection  30 mg Subcutaneous Daily   nicotine  14 mg Transdermal Daily   Continuous Infusions:  0.9 % NaCl with KCl 20 mEq / L 100 mL/hr at 07/11/23 0322   cefTRIAXone (ROCEPHIN)  IV  metronidazole       LOS: 1 day   Hughie Closs, MD [facility]  07/11/2023, 9:17 AM   *Please note that this is a verbal dictation therefore any spelling or grammatical errors are due to the "[employer]agon [employer] One" system interpretation.  Please page via Amion and do not message via secure chat for urgent patient care matters. Secure chat can be used for non urgent patient care matters.  How to contact the [facility] Attending or Consulting provider 7A - 7P or covering provider during after hours 7P -7A, for this patient?  Check the care team in [facility] and look for a) attending/consulting TRH provider listed and b) the [facility] team listed. Page or secure chat 7A-7P. Log into [email or web address] and use [place]'s universal password to access. If you do not have the password, please contact the hospital operator. Locate the [facility] provider you are looking for under [facility] and page to a number that you can be directly reached. If you still have difficulty reaching the provider, please page the [facility] (Director on Call) for the Hospitalists listed on amion for assistance.

## 2023-07-11 NOTE — Plan of Care (Signed)

## 2023-07-11 NOTE — Plan of Care (Signed)
  Problem: Health Behavior/Discharge Planning: Goal: Ability to manage health-related needs will improve Outcome: Progressing   Problem: Clinical Measurements: Goal: Ability to maintain clinical measurements within normal limits will improve Outcome: Progressing   Problem: Clinical Measurements: Goal: Will remain free from infection Outcome: Progressing   

## 2023-07-12 ENCOUNTER — Encounter (HOSPITAL_COMMUNITY): Payer: Self-pay | Admitting: Family Medicine

## 2023-07-12 DIAGNOSIS — E876 Hypokalemia: Secondary | ICD-10-CM | POA: Diagnosis not present

## 2023-07-12 DIAGNOSIS — G894 Chronic pain syndrome: Secondary | ICD-10-CM

## 2023-07-12 DIAGNOSIS — R112 Nausea with vomiting, unspecified: Secondary | ICD-10-CM | POA: Diagnosis not present

## 2023-07-12 DIAGNOSIS — R9431 Abnormal electrocardiogram [ECG] [EKG]: Secondary | ICD-10-CM | POA: Diagnosis not present

## 2023-07-12 DIAGNOSIS — I10 Essential (primary) hypertension: Secondary | ICD-10-CM

## 2023-07-12 MED ORDER — LOPERAMIDE HCL 2 MG PO CAPS: 4.0000 mg | ORAL_CAPSULE | Freq: Four times a day (QID) | ORAL | Status: DC | PRN

## 2023-07-12 MED ORDER — LOPERAMIDE HCL 2 MG PO CAPS
4.0000 mg | ORAL_CAPSULE | Freq: Once | ORAL | Status: AC
  Administered 2023-07-12: 4 mg via ORAL
  Filled 2023-07-12: qty 2

## 2023-07-12 MED ORDER — TRAMADOL HCL 50 MG PO TABS
50.0000 mg | ORAL_TABLET | Freq: Three times a day (TID) | ORAL | Status: DC | PRN
Start: 2023-07-12 — End: 2023-07-12

## 2023-07-12 MED ORDER — TRAMADOL HCL 50 MG PO TABS
100.0000 mg | ORAL_TABLET | Freq: Three times a day (TID) | ORAL | Status: DC
  Administered 2023-07-12 – 2023-07-13 (×4): 100 mg via ORAL
  Filled 2023-07-12 (×4): qty 2

## 2023-07-12 MED ORDER — BUPRENORPHINE HCL 2 MG SL SUBL
4.0000 mg | SUBLINGUAL_TABLET | Freq: Two times a day (BID) | SUBLINGUAL | Status: DC
  Administered 2023-07-12 – 2023-07-13 (×3): 4 mg via SUBLINGUAL
  Filled 2023-07-12 (×3): qty 2

## 2023-07-12 MED ORDER — MORPHINE SULFATE (PF) 2 MG/ML IV SOLN
2.0000 mg | INTRAVENOUS | Status: DC | PRN
  Administered 2023-07-12: 2 mg via INTRAVENOUS
  Filled 2023-07-12: qty 1

## 2023-07-12 NOTE — Assessment & Plan Note (Signed)
07-12-2023 no documented EGD or biopsy show her to have IBD. In fact, her only small biopsy in 2004 did not show any SB abnormality.

## 2023-07-12 NOTE — Assessment & Plan Note (Addendum)
07-12-2023 chronic. Continue buprenorphine, ultram. Has valid Rx for ultram 50 mg # 180 for 30 days, 8 mg buprenorphine # 30 for 30 days by [facility],

## 2023-07-12 NOTE — Assessment & Plan Note (Signed)
07-12-2023 repleted with IV and PO Kcl. Repeat K level normal. 07-13-2023 will give 40 meq kcl prior to discharge.

## 2023-07-12 NOTE — Assessment & Plan Note (Signed)
07-12-2023 repleted with IV Mg. Repeat Mg level 2.6 07-13-2023 resolved.

## 2023-07-12 NOTE — Assessment & Plan Note (Signed)
07-12-2023 continue with subutex. No indication for IV morphine. ESR and CRP are normal.  07-13-2023 pt receives long term pain meds from [facility]. No Rx for opiates will be provided.

## 2023-07-12 NOTE — Assessment & Plan Note (Signed)
07-12-2023 chronic.

## 2023-07-12 NOTE — TOC Progression Note (Signed)
Transition of Care [facility] - Progression Note    Patient Details  Name: [name] MRN: [id number] Date of Birth: 10-Oct-1970  Transition of [facility]) CM/SW Contact  Zakiya Sporrer A [place], [place] Phone Number: 07/12/2023, 2:46 PM  Clinical Narrative:     CSW spoke with pt at bedside regarding transportation at discharge tomorrow. She stated that she is contacting family members and has one more option but will let CSW know tomorrow if she was able to get the assistance. Pt states she is unable to use public transportation. Taxi voucher to be utilized.   TOC will continue to follow.        Expected Discharge Plan and Services                                               Social Determinants of Health (SDOH) Interventions SDOH Screenings   Food Insecurity: No Food Insecurity (07/11/2023)  Recent Concern: Food Insecurity - Food Insecurity Present (05/25/2023)  Housing: Low Risk  (07/11/2023)  Recent Concern: Housing - Medium Risk (05/25/2023)  Transportation Needs: No Transportation Needs (07/11/2023)  Recent Concern: Transportation Needs - Unmet Transportation Needs (05/25/2023)  Utilities: Not At Risk (07/11/2023)  Recent Concern: Utilities - At Risk (05/25/2023)  Alcohol Screen: Low Risk  (04/22/2021)  Depression (PHQ2-9): Low Risk  (01/04/2022)  Tobacco Use: High Risk (07/10/2023)    Readmission Risk Interventions     No data to display

## 2023-07-12 NOTE — Progress Notes (Signed)
   Repeat EKG, now that hypomagnesemia has been corrected, shows normal Qtc.    Carollee Herter, DO [facility]

## 2023-07-12 NOTE — Subjective & Objective (Addendum)
Pt seen and examined. Pt c/o that she "pulled" her shoulder muscles last night while using her cane.  Tolerated immodium. Only 1 recorded BM last night.

## 2023-07-12 NOTE — Assessment & Plan Note (Signed)
07-12-2023 stable.

## 2023-07-12 NOTE — Progress Notes (Addendum)
PROGRESS NOTE    [name]  ZOX:[id number] DOB: 02/20/1971 DOA: 07/10/2023 PCP: [facility]  Subjective: Pt seen and examined. Has had issues with her bowels her entire life.  Still with diarrhea.  ESR and CRP are negative. WBC is normal.  No fevers.  Takes buprenorphine 4 mg bid. Ultram 100 mg q8h prn.   Hospital Course: HPI: This is a 52 year old female with past medical history of IV heroine use now on Suboxone,alcohol use, HTN, Crohn's disease, OSA, depression, tobacco use, hypokalemia, hypophosphatemia, hypomagnesemia, prolonged QT.  She presents with complaint of 1 week nausea vomiting, inability to keep her meds down.  She endorses left-sided chest pain that wrapped around her back to the edge of her shoulder blades.  Pain occasionally in the right breast.  Her appetite has been poor.  She reports infrequent diarrhea and shortness of breath.  At baseline patient on the rollator.  She became too fatigued and came to the ER.  Per patient she has recurrent episodes of nausea vomiting.  Her last colonoscopy was 30 years ago.  Per patient her last EGD was last year. Patient reports at baseline she is quite fatigued to do much around the house.  She cannot stand for long and appears a bit overwhelmed.   In the ER patient's vital stable.  Potassium 2.5, magnesium 1.0.  CT abdomen and pelvis with contrast shows wall thickening in the right colon and transverse colon compatible with infectious or inflammatory colitis.  Patient treated with Rocephin and Flagyl.  Significant Events: Admitted 07/10/2023 for N/V, colitis   Significant Labs: Admission K 2.5, WBC 6.5, BUN9, Scr 0.91, Mg 1.0  Significant Imaging Studies: 07-10-2023 CT abd shows Wall thickening in the right colon and transverse colon compatible with infectious or inflammatory colitis. Hepatic steatosis. Common bile duct mildly dilated at 9 mm.  No visible ductal stones. Aortic atherosclerosis.  Antibiotic  Therapy: Anti-infectives (From admission, onward)    Start     Dose/Rate Route Frequency Ordered Stop   07/11/23 2200  cefTRIAXone (ROCEPHIN) 2 g in sodium chloride 0.9 % 100 mL IVPB        2 g 200 mL/hr over 30 Minutes Intravenous Every 24 hours 07/10/23 2342     07/11/23 1000  metroNIDAZOLE (FLAGYL) IVPB 500 mg        500 mg 100 mL/hr over 60 Minutes Intravenous Every 12 hours 07/10/23 2343     07/10/23 2300  cefTRIAXone (ROCEPHIN) 2 g in sodium chloride 0.9 % 100 mL IVPB        2 g 200 mL/hr over 30 Minutes Intravenous  Once 07/10/23 2253 07/10/23 2332   07/10/23 2300  metroNIDAZOLE (FLAGYL) IVPB 500 mg        500 mg 100 mL/hr over 60 Minutes Intravenous  Once 07/10/23 2253 07/11/23 0038       Procedures:   Consultants:     Assessment and Plan: * Hypomagnesemia 07-12-2023 repleted with IV Mg. Repeat Mg level 2.6  Prolonged QT interval 07-12-2023 likely due to hypomagnesemia. Repeat EKG  Nausea vomiting and diarrhea 07-12-2023 ESR and CRP are normal. Doubt her diarrhea is from her Crohn's. Only 1 BM recorded last night. No need for IV morphine. Restart her ultram. Give 1 dose of immodium. Plan for DC in AM.  Hypokalemia 07-12-2023 repleted with IV and PO Kcl. Repeat K level normal.  Tobacco use 07-12-2023 chronic.  Major depressive disorder 07-12-2023 stable.  Obstructive sleep apnea 07-12-2023 stable  Crohn's disease -  self reported. no biopsy proving Crohn's disease. small biopsy on 03-18-2003 07-12-2023 no documented EGD or biopsy show her to have IBD. In fact, her only small biopsy in 2004 did not show any SB abnormality.  History of heroin use 07-12-2023 chronic. Continue buprenorphine, ultram. Has valid Rx for ultram 50 mg # 180 for 30 days, 8 mg buprenorphine # 30 for 30 days by [facility],   Generalized anxiety disorder 07-12-2023 stable.  Benign essential hypertension 07-12-2023 continue with po norvasc 10 mg every day.  Chronic pain  syndrome 07-12-2023 continue with subutex. No indication for IV morphine. ESR and CRP are normal.   DVT prophylaxis: enoxaparin (LOVENOX) injection 30 mg Start: 07/11/23 1000    Code Status: Full Code Family Communication: no family at bedside. Pt is decisional Disposition Plan: return home Reason for continuing need for hospitalization: monitoring diarrhea after immodium  Objective: Vitals:   07/11/23 1559 07/11/23 1932 07/11/23 2346 07/12/23 0508  BP: 111/75 (!) 124/94 117/77 134/89  Pulse: 80 84 82 81  Resp: 16 18 18 18   Temp: 98.7 F (37.1 C) 98.8 F (37.1 C) 98.9 F (37.2 C) 98.4 F (36.9 C)  TempSrc: Oral Oral Oral Oral  SpO2: 96% 98% 97% 97%  Weight:      Height:        Intake/Output Summary (Last 24 hours) at 07/12/2023 1057 Last data filed at 07/11/2023 1416 Gross per 24 hour  Intake 955.44 ml  Output --  Net 955.44 ml   Filed Weights   07/10/23 1537  Weight: 44.5 kg    Examination:  Physical Exam Vitals and nursing note reviewed.  Constitutional:      General: She is not in acute distress.    Appearance: She is not toxic-appearing or diaphoretic.     Comments: Appears chronically ill  HENT:     Head: Normocephalic and atraumatic.     Nose: Nose normal.  Eyes:     General: No scleral icterus. Cardiovascular:     Rate and Rhythm: Normal rate and regular rhythm.  Pulmonary:     Effort: Pulmonary effort is normal.     Breath sounds: Normal breath sounds.  Abdominal:     General: Bowel sounds are normal.     Palpations: Abdomen is soft.  Musculoskeletal:     Right lower leg: No edema.     Left lower leg: No edema.  Skin:    General: Skin is dry.     Capillary Refill: Capillary refill takes less than 2 seconds.     Comments: Gouty nodules on right index finger.  Neurological:     General: No focal deficit present.     Mental Status: She is alert and oriented to person, place, and time.     Data Reviewed: I have personally reviewed  following labs and imaging studies  CBC: Recent Labs  Lab 07/10/23 1559 07/11/23 0045 07/11/23 0634  WBC 7.0 6.5 6.2  NEUTROABS 5.1  --  4.1  HGB 11.3* 11.6* 9.2*  HCT 32.3* 33.2* 26.8*  MCV 101.3* 104.4* 103.9*  PLT 388 377 334   Basic Metabolic Panel: Recent Labs  Lab 07/10/23 1559 07/10/23 2256 07/11/23 0045 07/11/23 0634  NA 138  --   --  137  K 2.5*  --   --  3.9  CL 97*  --   --  102  CO2 25  --   --  26  GLUCOSE 90  --   --  70  BUN 9  --   --  8  CREATININE 0.91  --  0.90 0.71  CALCIUM 8.1*  --   --  7.2*  MG 1.0*  --   --  2.6*  PHOS  --  2.9  --   --    GFR: Estimated Creatinine Clearance: 57.1 mL/min (by C-G formula based on SCr of 0.71 mg/dL). Liver Function Tests: Recent Labs  Lab 07/10/23 1559  AST 72*  ALT 25  ALKPHOS 160*  BILITOT 1.4*  PROT 6.1*  ALBUMIN 3.0*   Recent Labs  Lab 07/10/23 1559  LIPASE 26    Radiology Studies: CT ABDOMEN PELVIS W CONTRAST  Result Date: 07/10/2023 CLINICAL DATA:  Epigastric pain, nausea, vomiting EXAM: CT ABDOMEN AND PELVIS WITH CONTRAST TECHNIQUE: Multidetector CT imaging of the abdomen and pelvis was performed using the standard protocol following bolus administration of intravenous contrast. RADIATION DOSE REDUCTION: This exam was performed according to the departmental dose-optimization program which includes automated exposure control, adjustment of the mA and/or kV according to patient size and/or use of iterative reconstruction technique. CONTRAST:  75mL OMNIPAQUE IOHEXOL 350 MG/ML SOLN COMPARISON:  05/24/2023 FINDINGS: Lower chest: No acute abnormality Hepatobiliary: Diffuse low-density throughout the liver compatible with fatty infiltration. No focal abnormality. Gallbladder unremarkable. Mild dilatation of the common bile duct measuring up to 9 mm in the pancreatic head. No visible ductal stones. Pancreas: No focal abnormality or ductal dilatation. Spleen: No focal abnormality.  Normal size.  Adrenals/Urinary Tract: No adrenal abnormality. No focal renal abnormality. No stones or hydronephrosis. Urinary bladder is unremarkable. Stomach/Bowel: Wall thickening within the right colon and transverse colon compatible with infectious or inflammatory colitis. Stomach and small bowel decompressed, unremarkable. Vascular/Lymphatic: Aortic atherosclerosis. Reproductive: Uterus and adnexa unremarkable.  No mass. Other: No free fluid or free air. Musculoskeletal: No acute bony abnormality. IMPRESSION: Wall thickening in the right colon and transverse colon compatible with infectious or inflammatory colitis. Hepatic steatosis. Common bile duct mildly dilated at 9 mm.  No visible ductal stones. Aortic atherosclerosis. Electronically Signed   By: Charlett Nose M.D.   On: 07/10/2023 21:20   DG Chest 1 View  Result Date: 07/10/2023 CLINICAL DATA:  Chest pain. EXAM: CHEST  1 VIEW COMPARISON:  Chest radiograph dated 05/24/2023. FINDINGS: The heart size and mediastinal contours are within normal limits. Both lungs are clear. The visualized skeletal structures are unremarkable. IMPRESSION: No active disease. Electronically Signed   By: Elgie Collard M.D.   On: 07/10/2023 19:10    Scheduled Meds:  amLODipine  10 mg Oral Daily   buprenorphine  4 mg Sublingual BID   enoxaparin (LOVENOX) injection  30 mg Subcutaneous Daily   loperamide  4 mg Oral Once   nicotine  14 mg Transdermal Daily   pravastatin  20 mg Oral Daily   traMADol  100 mg Oral TID   Continuous Infusions:  cefTRIAXone (ROCEPHIN)  IV 2 g (07/11/23 2110)   metronidazole 500 mg (07/11/23 2113)     LOS: 2 days   Time spent: 30 minutes  Carollee Herter, DO  [facility]  07/12/2023, 10:57 AM

## 2023-07-12 NOTE — Assessment & Plan Note (Signed)
07-12-2023 likely due to hypomagnesemia. Repeat EKG 07-13-2023 resolved. EKG yesterday showed normalized QTc

## 2023-07-12 NOTE — Hospital Course (Signed)
HPI: This is a 52 year old female with past medical history of IV heroine use now on Suboxone,alcohol use, HTN, Crohn's disease, OSA, depression, tobacco use, hypokalemia, hypophosphatemia, hypomagnesemia, prolonged QT.  She presents with complaint of 1 week nausea vomiting, inability to keep her meds down.  She endorses left-sided chest pain that wrapped around her back to the edge of her shoulder blades.  Pain occasionally in the right breast.  Her appetite has been poor.  She reports infrequent diarrhea and shortness of breath.  At baseline patient on the rollator.  She became too fatigued and came to the ER.  Per patient she has recurrent episodes of nausea vomiting.  Her last colonoscopy was 30 years ago.  Per patient her last EGD was last year. Patient reports at baseline she is quite fatigued to do much around the house.  She cannot stand for long and appears a bit overwhelmed.   In the ER patient's vital stable.  Potassium 2.5, magnesium 1.0.  CT abdomen and pelvis with contrast shows wall thickening in the right colon and transverse colon compatible with infectious or inflammatory colitis.  Patient treated with Rocephin and Flagyl.  Significant Events: Admitted 07/10/2023 for N/V, colitis   Significant Labs: Admission K 2.5, WBC 6.5, BUN9, Scr 0.91, Mg 1.0  Significant Imaging Studies: 07-10-2023 CT abd shows Wall thickening in the right colon and transverse colon compatible with infectious or inflammatory colitis. Hepatic steatosis. Common bile duct mildly dilated at 9 mm.  No visible ductal stones. Aortic atherosclerosis.  Antibiotic Therapy: Anti-infectives (From admission, onward)    Start     Dose/Rate Route Frequency Ordered Stop   07/11/23 2200  cefTRIAXone (ROCEPHIN) 2 g in sodium chloride 0.9 % 100 mL IVPB        2 g 200 mL/hr over 30 Minutes Intravenous Every 24 hours 07/10/23 2342     07/11/23 1000  metroNIDAZOLE (FLAGYL) IVPB 500 mg        500 mg 100 mL/hr over 60  Minutes Intravenous Every 12 hours 07/10/23 2343     07/10/23 2300  cefTRIAXone (ROCEPHIN) 2 g in sodium chloride 0.9 % 100 mL IVPB        2 g 200 mL/hr over 30 Minutes Intravenous  Once 07/10/23 2253 07/10/23 2332   07/10/23 2300  metroNIDAZOLE (FLAGYL) IVPB 500 mg        500 mg 100 mL/hr over 60 Minutes Intravenous  Once 07/10/23 2253 07/11/23 0038       Procedures:   Consultants:

## 2023-07-12 NOTE — Progress Notes (Signed)
PT Cancellation Note  Patient Details Name: [name] MRN: [id number] DOB: [birth year]   Cancelled Treatment:    Reason Eval/Treat Not Completed: (P) OT screened, no needs identified, will sign off  Kristen Bushway B. Beverely Risen PT, DPT Acute Rehabilitation Services Please use secure chat or  Call Office [phone]    Elon Alas Fleet 07/12/2023, 11:20 AM

## 2023-07-12 NOTE — Assessment & Plan Note (Addendum)
07-12-2023 ESR and CRP are normal. Doubt her diarrhea is from her Crohn's. Only 1 BM recorded last night. No need for IV morphine. Restart her ultram. Give 1 dose of immodium. Plan for DC in AM.  07-13-2023 pt stated yesterday she has had issues with her bowel her entire life. Only 1 BM recorded last night. Stable for DC. Prn imodium for home use

## 2023-07-12 NOTE — Evaluation (Signed)
Occupational Therapy Evaluation Patient Details Name: [name] MRN: [id number] DOB: 27-Apr-1971 Today's Date: 07/12/2023   History of Present Illness 52 year old female presents to ED 11/18 with complaint of 1 week nausea vomiting, inability to keep her meds down.  She endorses left-sided chest pain that wrapped around her back to the edge of her shoulder blades. Admitted PMH:IV heroine use now on Suboxone,alcohol use, HTN, Crohn's disease, OSA, depression, tobacco use, hypokalemia, hypophosphatemia, hypomagnesemia, prolonged QT.   Clinical Impression   [name] was evaluated s/p the above admission list. She is mod I with AD at baseline. Upon evaluation, pt demonstrated mod I ability to complete mobility and ADLs with SPC. Pt does not require further acute, or follow up OT services. Recommend discharge back to pt's environment with assist as needed. OT to sign off with appreciation of order, please re-consult if needed.         If plan is discharge home, recommend the following: Assist for transportation    Functional Status Assessment  Patient has had a recent decline in their functional status and demonstrates the ability to make significant improvements in function in a reasonable and predictable amount of time.  Equipment Recommendations  None recommended by OT       Precautions / Restrictions Precautions Precautions: Fall Restrictions Weight Bearing Restrictions: No      Mobility Bed Mobility Overal bed mobility: Needs Assistance, Modified Independent        Transfers Overall transfer level: Modified independent       General transfer comment: with SPC      Balance Overall balance assessment: Modified Independent           ADL either performed or assessed with clinical judgement   ADL Overall ADL's : At baseline;Modified independent           General ADL Comments: mod I with SPC     Vision Baseline Vision/History: 0 No visual deficits Vision  Assessment?: No apparent visual deficits     Perception Perception: Within Functional Limits       Praxis Praxis: WFL       Pertinent Vitals/Pain Pain Assessment Pain Assessment: No/denies pain     Extremity/Trunk Assessment Upper Extremity Assessment Upper Extremity Assessment: Generalized weakness   Lower Extremity Assessment Lower Extremity Assessment: Generalized weakness   Cervical / Trunk Assessment Cervical / Trunk Assessment: Normal   Communication Communication Communication: No apparent difficulties   Cognition Arousal: Alert Behavior During Therapy: WFL for tasks assessed/performed Overall Cognitive Status: Within Functional Limits for tasks assessed               General Comments  VSS     Home Living Family/patient expects to be discharged to:: Private residence Living Arrangements: Alone (Fiance is in adn out) Available Help at Discharge: Friend(s);Available PRN/intermittently Type of Home: House Home Access: Stairs to enter [employer] of Steps: 4-5 Entrance Stairs-Rails: Right;Left Home Layout: One level     Bathroom Shower/Tub: Chief Strategy Officer: Standard     Home Equipment: Rollator (4 wheels);Shower seat;Cane - single point          Prior Functioning/Environment Prior Level of Function : Independent/Modified Independent             Mobility Comments: SPC in the home, rollator outside of the home ADLs Comments: mod I, truck is broke down so hasnt drove in several months        OT Problem List: Decreased activity tolerance;Impaired balance (sitting and/or standing);Decreased  knowledge of precautions;Decreased knowledge of use of DME or AE         OT Goals(Current goals can be found in the care plan section) Acute Rehab OT Goals Patient Stated Goal: less BMs OT Goal Formulation: With patient Time For Goal Achievement: 07/12/23 Potential to Achieve Goals: Good   AM-PAC OT "6 Clicks" Daily  Activity     Outcome Measure Help from another person eating meals?: None Help from another person taking care of personal grooming?: None Help from another person toileting, which includes using toliet, bedpan, or urinal?: None Help from another person bathing (including washing, rinsing, drying)?: None Help from another person to put on and taking off regular upper body clothing?: None Help from another person to put on and taking off regular lower body clothing?: None 6 Click Score: 24   End of Session Equipment Utilized During Treatment: Other (comment) [facility]) Nurse Communication: Mobility status  Activity Tolerance: Patient tolerated treatment well Patient left: in bed;with call bell/phone within reach  OT Visit Diagnosis: Muscle weakness (generalized) (M62.81)                Time: 2130-8657 OT Time Calculation (min): 16 min Charges:  OT General Charges $OT Visit: 1 Visit OT Evaluation $OT Eval Low Complexity: 1 Low  Derenda Mis, OTR/L Acute Rehabilitation Services Office [phone] Secure Chat Communication Preferred   Donia Pounds 07/12/2023, 10:59 AM

## 2023-07-12 NOTE — Assessment & Plan Note (Signed)
07-12-2023 continue with po norvasc 10 mg every day.  07-13-2023 stable.

## 2023-07-13 ENCOUNTER — Other Ambulatory Visit: Payer: Self-pay

## 2023-07-13 DIAGNOSIS — R9431 Abnormal electrocardiogram [ECG] [EKG]: Secondary | ICD-10-CM | POA: Diagnosis not present

## 2023-07-13 DIAGNOSIS — R112 Nausea with vomiting, unspecified: Secondary | ICD-10-CM | POA: Diagnosis not present

## 2023-07-13 DIAGNOSIS — F1191 Opioid use, unspecified, in remission: Secondary | ICD-10-CM

## 2023-07-13 DIAGNOSIS — E876 Hypokalemia: Secondary | ICD-10-CM | POA: Diagnosis not present

## 2023-07-13 LAB — COMPREHENSIVE METABOLIC PANEL
ALT: 29 U/L (ref 0–44)
AST: 54 U/L — ABNORMAL HIGH (ref 15–41)
Albumin: 2.4 g/dL — ABNORMAL LOW (ref 3.5–5.0)
Alkaline Phosphatase: 167 U/L — ABNORMAL HIGH (ref 38–126)
Anion gap: 9 (ref 5–15)
BUN: <5 mg/dL — ABNORMAL LOW (ref 6–20)
CO2: 27 mmol/L (ref 22–32)
Calcium: 8.3 mg/dL — ABNORMAL LOW (ref 8.9–10.3)
Chloride: 101 mmol/L (ref 98–111)
Creatinine, Ser: 0.75 mg/dL (ref 0.44–1.00)
GFR, Estimated: >60 mL/min (ref >60)
Glucose, Bld: 90 mg/dL (ref 70–99)
Potassium: 3.2 mmol/L — ABNORMAL LOW (ref 3.5–5.1)
Sodium: 137 mmol/L (ref 135–145)
Total Bilirubin: 0.6 mg/dL (ref <1.2)
Total Protein: 5 g/dL — ABNORMAL LOW (ref 6.5–8.1)

## 2023-07-13 LAB — URIC ACID: Uric Acid, Serum: 6.1 mg/dL (ref 2.5–7.1)

## 2023-07-13 LAB — MAGNESIUM: Magnesium: 1.1 mg/dL — ABNORMAL LOW (ref 1.7–2.4)

## 2023-07-13 MED ORDER — LOPERAMIDE HCL 2 MG PO CAPS
4.0000 mg | ORAL_CAPSULE | Freq: Four times a day (QID) | ORAL | 0 refills | Status: DC | PRN
  Filled 2023-07-13: qty 30, 4d supply, fill #0

## 2023-07-13 MED ORDER — INFLUENZA VIRUS VACC SPLIT PF (FLUZONE) 0.5 ML IM SUSY
0.5000 mL | PREFILLED_SYRINGE | INTRAMUSCULAR | Status: AC
  Administered 2023-07-13: 0.5 mL via INTRAMUSCULAR
  Filled 2023-07-13: qty 0.5

## 2023-07-13 MED ORDER — POTASSIUM CHLORIDE CRYS ER 20 MEQ PO TBCR
40.0000 meq | EXTENDED_RELEASE_TABLET | Freq: Once | ORAL | Status: AC
  Administered 2023-07-13: 40 meq via ORAL
  Filled 2023-07-13: qty 2

## 2023-07-13 NOTE — Discharge Summary (Signed)
[facility] Phys[facility] Discharge Summary   Patient name: [name]  Admit date:     07/10/2023  Discharge date: 07/13/2023  Attending Physician: Gery Pray [[id number]  Discharge Physician: Carollee Herter   PCP: [facility]  Admitted From: Home  Disposition:  Home  Recommendations for Outpatient Follow-up:  Follow up with PCP in 1-2 weeks Follow up with your pain clinic provider as scheduled  Home Health:No Equipment/Devices: None    Discharge Condition:Stable CODE STATUS:FULL Diet recommendation: Regular Fluid Restriction: None  Hospital Summary: HPI: This is a 52 year old female with past medical history of IV heroine use now on Suboxone,alcohol use, HTN, Crohn's disease, OSA, depression, tobacco use, hypokalemia, hypophosphatemia, hypomagnesemia, prolonged QT.  She presents with complaint of 1 week nausea vomiting, inability to keep her meds down.  She endorses left-sided chest pain that wrapped around her back to the edge of her shoulder blades.  Pain occasionally in the right breast.  Her appetite has been poor.  She reports infrequent diarrhea and shortness of breath.  At baseline patient on the rollator.  She became too fatigued and came to the ER.  Per patient she has recurrent episodes of nausea vomiting.  Her last colonoscopy was 30 years ago.  Per patient her last EGD was last year. Patient reports at baseline she is quite fatigued to do much around the house.  She cannot stand for long and appears a bit overwhelmed.   In the ER patient's vital stable.  Potassium 2.5, magnesium 1.0.  CT abdomen and pelvis with contrast shows wall thickening in the right colon and transverse colon compatible with infectious or inflammatory colitis.  Patient treated with Rocephin and Flagyl.  Significant Events: Admitted 07/10/2023 for N/V, colitis   Significant Labs: Admission K 2.5, WBC 6.5, BUN9, Scr 0.91, Mg 1.0  Significant Imaging Studies: 07-10-2023 CT  abd shows Wall thickening in the right colon and transverse colon compatible with infectious or inflammatory colitis. Hepatic steatosis. Common bile duct mildly dilated at 9 mm.  No visible ductal stones. Aortic atherosclerosis.  Antibiotic Therapy: Anti-infectives (From admission, onward)    Start     Dose/Rate Route Frequency Ordered Stop   07/11/23 2200  cefTRIAXone (ROCEPHIN) 2 g in sodium chloride 0.9 % 100 mL IVPB        2 g 200 mL/hr over 30 Minutes Intravenous Every 24 hours 07/10/23 2342     07/11/23 1000  metroNIDAZOLE (FLAGYL) IVPB 500 mg        500 mg 100 mL/hr over 60 Minutes Intravenous Every 12 hours 07/10/23 2343     07/10/23 2300  cefTRIAXone (ROCEPHIN) 2 g in sodium chloride 0.9 % 100 mL IVPB        2 g 200 mL/hr over 30 Minutes Intravenous  Once 07/10/23 2253 07/10/23 2332   07/10/23 2300  metroNIDAZOLE (FLAGYL) IVPB 500 mg        500 mg 100 mL/hr over 60 Minutes Intravenous  Once 07/10/23 2253 07/11/23 0038       Procedures:   Consultants:    Hospital Course by Problem: * Hypomagnesemia 07-12-2023 repleted with IV Mg. Repeat Mg level 2.6 07-13-2023 resolved.  Colitis 07-13-2023 ESR and CRP are normal. Likely due to diarrhea. No indications for abx.  Prolonged QT interval 07-12-2023 likely due to hypomagnesemia. Repeat EKG 07-13-2023 resolved. EKG yesterday showed normalized QTc  Nausea vomiting and diarrhea 07-12-2023 ESR and CRP are normal. Doubt her diarrhea is from her Crohn's. Only 1 BM recorded last  night. No need for IV morphine. Restart her ultram. Give 1 dose of immodium. Plan for DC in AM.  07-13-2023 pt stated yesterday she has had issues with her bowel her entire life. Only 1 BM recorded last night. Stable for DC. Prn imodium for home use  Hypokalemia 07-12-2023 repleted with IV and PO Kcl. Repeat K level normal. 07-13-2023 will give 40 meq kcl prior to discharge.  Tobacco use 07-12-2023 chronic.  Major depressive  disorder 07-12-2023 stable.  Obstructive sleep apnea 07-12-2023 stable  Crohn's disease - self reported. no biopsy proving Crohn's disease. small biopsy on 03-18-2003 07-12-2023 no documented EGD or biopsy show her to have IBD. In fact, her only small biopsy in 2004 did not show any SB abnormality.  History of heroin use 07-12-2023 chronic. Continue buprenorphine, ultram. Has valid Rx for ultram 50 mg # 180 for 30 days, 8 mg buprenorphine # 30 for 30 days by [facility] 07-13-2023 stable. No discharge new Rx for pain meds.  Generalized anxiety disorder 07-12-2023 stable. 07-13-2023 stable.  Benign essential hypertension 07-12-2023 continue with po norvasc 10 mg every day.  07-13-2023 stable.  Chronic pain syndrome 07-12-2023 continue with subutex. No indication for IV morphine. ESR and CRP are normal.  07-13-2023 pt receives long term pain meds from [facility]. No Rx for opiates will be provided.  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 Uric acid is normal but was elevated to 13.6 in October 2024. Pt need to f/u with PCP to start allopurinol.    Discharge Diagnoses:  Principal Problem:   Hypomagnesemia Active Problems:   Prolonged QT interval   Colitis   Hypokalemia   Nausea vomiting and diarrhea   Gout   Chronic pain syndrome   Benign essential hypertension   Generalized anxiety disorder   History of heroin use   Crohn's disease - self reported. no biopsy proving Crohn's disease. small biopsy on 03-18-2003   Obstructive sleep apnea   Major depressive disorder   Tobacco use   Discharge Instructions  Discharge Instructions     Call MD for:  extreme fatigue   Complete by: As directed    Call MD for:  persistant dizziness or light-headedness   Complete by: As directed    Call MD for:  persistant nausea and vomiting   Complete by: As directed    Call MD for:  temperature >100.4   Complete by: As directed    Diet - low sodium  heart healthy   Complete by: As directed    Discharge instructions   Complete by: As directed    1. Follow up with your primary care provider in 1-2 weeks following discharge from hospital   Increase activity slowly   Complete by: As directed       Allergies as of 07/13/2023       Reactions   Penicillins Hives   Did it involve swelling of the face/tongue/throat, SOB, or low BP? No Did it involve sudden or severe rash/hives, skin peeling, or any reaction on the inside of your mouth or nose? Yes Did you need to seek medical attention at a hospital or doctor's office? Unknown When did it last happen?    childhood   If all above answers are "NO", may proceed with cephalosporin use.        Medication List     STOP taking these medications    lisinopril 20 MG tablet Commonly known as: ZESTRIL  TAKE these medications    amLODipine 10 MG tablet Commonly known as: NORVASC Take 1 tablet (10 mg total) by mouth daily.   buprenorphine 8 MG Subl SL tablet Commonly known as: SUBUTEX DISSOLVE 1/2 TABLET UNDER THE TOUNGE TWICE A DAY   loperamide 2 MG capsule Commonly known as: IMODIUM Take 2 capsules (4 mg total) by mouth every 6 (six) hours as needed for diarrhea or loose stools.   pravastatin 20 MG tablet Commonly known as: PRAVACHOL Take 1 tablet (20 mg total) by mouth daily.   traMADol 50 MG tablet Commonly known as: ULTRAM Take 1-2 tablets (50-100 mg total) by mouth every 8 (eight) hours as needed.        Allergies  Allergen Reactions   Penicillins Hives    Did it involve swelling of the face/tongue/throat, SOB, or low BP? No Did it involve sudden or severe rash/hives, skin peeling, or any reaction on the inside of your mouth or nose? Yes Did you need to seek medical attention at a hospital or doctor's office? Unknown When did it last happen?    childhood   If all above answers are "NO", may proceed with cephalosporin use.      Discharge Exam: Vitals:    07/13/23 0425 07/13/23 0749  BP: 135/85 (!) 155/92  Pulse: 75 66  Resp: 18 18  Temp: 98.7 F (37.1 C) 97.6 F (36.4 C)  SpO2: 95% 99%    Physical Exam Vitals and nursing note reviewed.  Constitutional:      General: She is not in acute distress.    Appearance: She is not toxic-appearing.     Comments: Appears chronically ill, thin.  HENT:     Head: Normocephalic and atraumatic.     Nose: Nose normal.  Cardiovascular:     Rate and Rhythm: Normal rate and regular rhythm.  Pulmonary:     Effort: Pulmonary effort is normal.     Breath sounds: Normal breath sounds.  Abdominal:     General: Bowel sounds are normal.  Musculoskeletal:     Left lower leg: No edema.     Comments: Tophaceous gout in distal finger phalanx .  Skin:    General: Skin is warm and dry.     Capillary Refill: Capillary refill takes less than 2 seconds.  Neurological:     General: No focal deficit present.     Mental Status: She is oriented to person, place, and time.     The results of significant diagnostics from this hospitalization (including imaging, microbiology, ancillary and laboratory) are listed below for reference.     Labs:  Basic Metabolic Panel: Recent Labs  Lab 07/10/23 1559 07/10/23 2256 07/11/23 0045 07/11/23 0634 07/13/23 0627  NA 138  --   --  137 137  K 2.5*  --   --  3.9 3.2*  CL 97*  --   --  102 101  CO2 25  --   --  26 27  GLUCOSE 90  --   --  70 90  BUN 9  --   --  8 <5*  CREATININE 0.91  --  0.90 0.71 0.75  CALCIUM 8.1*  --   --  7.2* 8.3*  MG 1.0*  --   --  2.6* 1.1*  PHOS  --  2.9  --   --   --    Liver Function Tests: Recent Labs  Lab 07/10/23 1559 07/13/23 0627  AST 72* 54*  ALT 25 29  ALKPHOS 160* 167*  BILITOT 1.4* 0.6  PROT 6.1* 5.0*  ALBUMIN 3.0* 2.4*   Recent Labs  Lab 07/10/23 1559  LIPASE 26    CBC: Recent Labs  Lab 07/10/23 1559 07/11/23 0045 07/11/23 0634  WBC 7.0 6.5 6.2  NEUTROABS 5.1  --  4.1  HGB 11.3* 11.6* 9.2*  HCT  32.3* 33.2* 26.8*  MCV 101.3* 104.4* 103.9*  PLT 388 377 334   Sepsis Labs Recent Labs  Lab 07/10/23 1559 07/11/23 0045 07/11/23 0634  WBC 7.0 6.5 6.2   Recent Labs    07/11/23 0634  CRP 0.5   Recent Labs    07/11/23 0634  ESRSEDRATE 7   Lab Results  Component Value Date/Time   LABURIC 6.1 07/13/2023 06:27 AM   LABURIC 13.6 (H) 05/24/2023 09:57 PM   LABURIC 6.7 06/22/2021 09:37 AM     Procedures/Studies: CT ABDOMEN PELVIS W CONTRAST  Result Date: 07/10/2023 CLINICAL DATA:  Epigastric pain, nausea, vomiting EXAM: CT ABDOMEN AND PELVIS WITH CONTRAST TECHNIQUE: Multidetector CT imaging of the abdomen and pelvis was performed using the standard protocol following bolus administration of intravenous contrast. RADIATION DOSE REDUCTION: This exam was performed according to the departmental dose-optimization program which includes automated exposure control, adjustment of the mA and/or kV according to patient size and/or use of iterative reconstruction technique. CONTRAST:  75mL OMNIPAQUE IOHEXOL 350 MG/ML SOLN COMPARISON:  05/24/2023 FINDINGS: Lower chest: No acute abnormality Hepatobiliary: Diffuse low-density throughout the liver compatible with fatty infiltration. No focal abnormality. Gallbladder unremarkable. Mild dilatation of the common bile duct measuring up to 9 mm in the pancreatic head. No visible ductal stones. Pancreas: No focal abnormality or ductal dilatation. Spleen: No focal abnormality.  Normal size. Adrenals/Urinary Tract: No adrenal abnormality. No focal renal abnormality. No stones or hydronephrosis. Urinary bladder is unremarkable. Stomach/Bowel: Wall thickening within the right colon and transverse colon compatible with infectious or inflammatory colitis. Stomach and small bowel decompressed, unremarkable. Vascular/Lymphatic: Aortic atherosclerosis. Reproductive: Uterus and adnexa unremarkable.  No mass. Other: No free fluid or free air. Musculoskeletal: No acute  bony abnormality. IMPRESSION: Wall thickening in the right colon and transverse colon compatible with infectious or inflammatory colitis. Hepatic steatosis. Common bile duct mildly dilated at 9 mm.  No visible ductal stones. Aortic atherosclerosis. Electronically Signed   By: Charlett Nose M.D.   On: 07/10/2023 21:20   DG Chest 1 View  Result Date: 07/10/2023 CLINICAL DATA:  Chest pain. EXAM: CHEST  1 VIEW COMPARISON:  Chest radiograph dated 05/24/2023. FINDINGS: The heart size and mediastinal contours are within normal limits. Both lungs are clear. The visualized skeletal structures are unremarkable. IMPRESSION: No active disease. Electronically Signed   By: Elgie Collard M.D.   On: 07/10/2023 19:10    Time coordinating discharge: 40 mins  SIGNED:  Carollee Herter, DO [facility] 07/13/23, 8:25 AM

## 2023-07-13 NOTE — Assessment & Plan Note (Signed)
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 Uric acid is normal but was elevated to 13.6 in October 2024. Pt need to f/u with PCP to start allopurinol.

## 2023-07-13 NOTE — Progress Notes (Signed)
PROGRESS NOTE    [name]  ION:[id number] DOB: Jun 19, 1971 DOA: 07/10/2023 PCP: [facility]  Subjective: Pt seen and examined. Pt c/o that she "pulled" her shoulder muscles last night while using her cane.  Tolerated immodium. Only 1 recorded BM last night.   Hospital Course: HPI: This is a 52 year old female with past medical history of IV heroine use now on Suboxone,alcohol use, HTN, Crohn's disease, OSA, depression, tobacco use, hypokalemia, hypophosphatemia, hypomagnesemia, prolonged QT.  She presents with complaint of 1 week nausea vomiting, inability to keep her meds down.  She endorses left-sided chest pain that wrapped around her back to the edge of her shoulder blades.  Pain occasionally in the right breast.  Her appetite has been poor.  She reports infrequent diarrhea and shortness of breath.  At baseline patient on the rollator.  She became too fatigued and came to the ER.  Per patient she has recurrent episodes of nausea vomiting.  Her last colonoscopy was 30 years ago.  Per patient her last EGD was last year. Patient reports at baseline she is quite fatigued to do much around the house.  She cannot stand for long and appears a bit overwhelmed.   In the ER patient's vital stable.  Potassium 2.5, magnesium 1.0.  CT abdomen and pelvis with contrast shows wall thickening in the right colon and transverse colon compatible with infectious or inflammatory colitis.  Patient treated with Rocephin and Flagyl.  Significant Events: Admitted 07/10/2023 for N/V, colitis   Significant Labs: Admission K 2.5, WBC 6.5, BUN9, Scr 0.91, Mg 1.0  Significant Imaging Studies: 07-10-2023 CT abd shows Wall thickening in the right colon and transverse colon compatible with infectious or inflammatory colitis. Hepatic steatosis. Common bile duct mildly dilated at 9 mm.  No visible ductal stones. Aortic atherosclerosis.  Antibiotic Therapy: Anti-infectives (From admission, onward)     Start     Dose/Rate Route Frequency Ordered Stop   07/11/23 2200  cefTRIAXone (ROCEPHIN) 2 g in sodium chloride 0.9 % 100 mL IVPB        2 g 200 mL/hr over 30 Minutes Intravenous Every 24 hours 07/10/23 2342     07/11/23 1000  metroNIDAZOLE (FLAGYL) IVPB 500 mg        500 mg 100 mL/hr over 60 Minutes Intravenous Every 12 hours 07/10/23 2343     07/10/23 2300  cefTRIAXone (ROCEPHIN) 2 g in sodium chloride 0.9 % 100 mL IVPB        2 g 200 mL/hr over 30 Minutes Intravenous  Once 07/10/23 2253 07/10/23 2332   07/10/23 2300  metroNIDAZOLE (FLAGYL) IVPB 500 mg        500 mg 100 mL/hr over 60 Minutes Intravenous  Once 07/10/23 2253 07/11/23 0038       Procedures:   Consultants:     Assessment and Plan: * Hypomagnesemia 07-12-2023 repleted with IV Mg. Repeat Mg level 2.6 07-13-2023 resolved.  Colitis 07-13-2023 ESR and CRP are normal. Likely due to diarrhea. No indications for abx.  Prolonged QT interval 07-12-2023 likely due to hypomagnesemia. Repeat EKG 07-13-2023 resolved. EKG yesterday showed normalized QTc  Nausea vomiting and diarrhea 07-12-2023 ESR and CRP are normal. Doubt her diarrhea is from her Crohn's. Only 1 BM recorded last night. No need for IV morphine. Restart her ultram. Give 1 dose of immodium. Plan for DC in AM.  07-13-2023 pt stated yesterday she has had issues with her bowel her entire life. Only 1 BM recorded last night. Stable  for DC. Prn imodium for home use  Hypokalemia 07-12-2023 repleted with IV and PO Kcl. Repeat K level normal. 07-13-2023 will give 40 meq kcl prior to discharge.  Tobacco use 07-12-2023 chronic.  Major depressive disorder 07-12-2023 stable.  Obstructive sleep apnea 07-12-2023 stable  Crohn's disease - self reported. no biopsy proving Crohn's disease. small biopsy on 03-18-2003 07-12-2023 no documented EGD or biopsy show her to have IBD. In fact, her only small biopsy in 2004 did not show any SB abnormality.  History of  heroin use 07-12-2023 chronic. Continue buprenorphine, ultram. Has valid Rx for ultram 50 mg # 180 for 30 days, 8 mg buprenorphine # 30 for 30 days by [facility] 07-13-2023 stable. No discharge new Rx for pain meds.  Generalized anxiety disorder 07-12-2023 stable. 07-13-2023 stable.  Benign essential hypertension 07-12-2023 continue with po norvasc 10 mg every day.  07-13-2023 stable.  Chronic pain syndrome 07-12-2023 continue with subutex. No indication for IV morphine. ESR and CRP are normal.  07-13-2023 pt receives long term pain meds from [facility]. No Rx for opiates will be provided.  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 Uric acid is normal but was elevated to 13.6 in October 2024. Pt need to f/u with PCP to start allopurinol.    DVT prophylaxis: enoxaparin (LOVENOX) injection 30 mg Start: 07/11/23 1000     Code Status: Full Code Family Communication: no family at bedside Disposition Plan: return home Reason for continuing need for hospitalization: medically stable for DC to home today.  Objective: Vitals:   07/12/23 1135 07/12/23 1545 07/13/23 0425 07/13/23 0749  BP: (!) 159/99 (!) 141/91 135/85 (!) 155/92  Pulse: 78 77 75 66  Resp: 16 17 18 18   Temp: 98.6 F (37 C) 99 F (37.2 C) 98.7 F (37.1 C) 97.6 F (36.4 C)  TempSrc: Oral Oral Oral   SpO2: 100% 99% 95% 99%  Weight:      Height:        Intake/Output Summary (Last 24 hours) at 07/13/2023 0825 Last data filed at 07/13/2023 0448 Gross per 24 hour  Intake 600 ml  Output --  Net 600 ml   Filed Weights   07/10/23 1537  Weight: 44.5 kg    Examination:  Physical Exam Vitals and nursing note reviewed.  Constitutional:      General: She is not in acute distress.    Appearance: She is not toxic-appearing.     Comments: Appears chronically ill, thin.  HENT:     Head: Normocephalic and atraumatic.     Nose: Nose normal.  Cardiovascular:     Rate and  Rhythm: Normal rate and regular rhythm.  Pulmonary:     Effort: Pulmonary effort is normal.     Breath sounds: Normal breath sounds.  Abdominal:     General: Bowel sounds are normal.  Musculoskeletal:     Left lower leg: No edema.  Skin:    General: Skin is warm and dry.     Capillary Refill: Capillary refill takes less than 2 seconds.  Neurological:     General: No focal deficit present.     Mental Status: She is oriented to person, place, and time.     Data Reviewed: I have personally reviewed following labs and imaging studies  CBC: Recent Labs  Lab 07/10/23 1559 07/11/23 0045 07/11/23 0634  WBC 7.0 6.5 6.2  NEUTROABS 5.1  --  4.1  HGB 11.3* 11.6* 9.2*  HCT 32.3* 33.2*  26.8*  MCV 101.3* 104.4* 103.9*  PLT 388 377 334   Basic Metabolic Panel: Recent Labs  Lab 07/10/23 1559 07/10/23 2256 07/11/23 0045 07/11/23 0634 07/13/23 0627  NA 138  --   --  137 137  K 2.5*  --   --  3.9 3.2*  CL 97*  --   --  102 101  CO2 25  --   --  26 27  GLUCOSE 90  --   --  70 90  BUN 9  --   --  8 <5*  CREATININE 0.91  --  0.90 0.71 0.75  CALCIUM 8.1*  --   --  7.2* 8.3*  MG 1.0*  --   --  2.6* 1.1*  PHOS  --  2.9  --   --   --    GFR: Estimated Creatinine Clearance: 57.1 mL/min (by C-G formula based on SCr of 0.75 mg/dL). Liver Function Tests: Recent Labs  Lab 07/10/23 1559 07/13/23 0627  AST 72* 54*  ALT 25 29  ALKPHOS 160* 167*  BILITOT 1.4* 0.6  PROT 6.1* 5.0*  ALBUMIN 3.0* 2.4*   Recent Labs  Lab 07/10/23 1559  LIPASE 26   Radiology Studies: No results found.  Scheduled Meds:  amLODipine  10 mg Oral Daily   buprenorphine  4 mg Sublingual BID   enoxaparin (LOVENOX) injection  30 mg Subcutaneous Daily   [START ON 07/14/2023] influenza vac split trivalent PF  0.5 mL Intramuscular Tomorrow-1000   nicotine  14 mg Transdermal Daily   potassium chloride  40 mEq Oral Once   pravastatin  20 mg Oral Daily   traMADol  100 mg Oral TID   Continuous  Infusions:     LOS: 3 days   Time spent: 40 minutes  Carollee Herter, DO  [facility]  07/13/2023, 8:25 AM

## 2023-07-13 NOTE — Plan of Care (Signed)

## 2023-07-13 NOTE — Assessment & Plan Note (Signed)
07-13-2023 ESR and CRP are normal. Likely due to diarrhea. No indications for abx.

## 2023-07-13 NOTE — Progress Notes (Signed)
07/13/2023  [name] DOB: 12-27-1970 MRN: [id number]   RIDER WAIVER AND RELEASE OF LIABILITY  For the purposes of helping with transportation needs, [facility] partners with outside transportation providers (taxi companies, [place], Catering manager.) to give [employer] patients or other approved people the choice of on-demand rides [employer]") to our buildings for non-emergency visits.  By using [employer], I, the person signing this document, on behalf of myself and/or any legal minors (in my care using the [employer]), agree:  Science writer given to me are supplied by independent, outside transportation providers who do not work for, or have any affiliation with, [employer]. [place] is not a transportation company. [place] has no control over the quality or safety of the rides I get using [employer]. [place] has no control over whether any outside ride will happen on time or not. [place] gives no guarantee on the reliability, quality, safety, or availability on any rides, or that no mistakes will happen. I know and accept that traveling by vehicle (car, truck, [employer], Zenaida Niece, bus, taxi, etc.) has risks of serious injuries such as disability, being paralyzed, and death. I know and agree the risk of using [employer] is mine alone, and not [employer]. Transport Services are provided "as is" and as are available. The transportation providers are in charge for all inspections and care of the vehicles used to provide these rides. I agree not to take legal action against [place], its agents, employees, officers, directors, representatives, insurers, attorneys, assigns, successors, subsidiaries, and affiliates at any time for any reasons related directly or indirectly to using [employer]. I also agree not to take legal action against  or its affiliates for any injury, death, or damage to property caused by or related to using  [employer]. I have read this Waiver and Release of Liability, and I understand the terms used in it and their legal meaning. This Waiver is freely and voluntarily given with the understanding that my right (or any legal minors) to legal action against  relating to [employer] is knowingly given up to use these services.   I attest that I read the Ride Waiver and Release of Liability to [name], gave Ms. Odaniel the opportunity to ask questions and answered the questions asked (if any). I affirm that [name] then provided consent for assistance with transportation.

## 2023-07-25 ENCOUNTER — Other Ambulatory Visit: Payer: Self-pay

## 2023-07-26 ENCOUNTER — Other Ambulatory Visit: Payer: Self-pay

## 2023-07-26 MED ORDER — ALBUTEROL SULFATE HFA 108 (90 BASE) MCG/ACT IN AERS
2.0000 | INHALATION_SPRAY | Freq: Two times a day (BID) | RESPIRATORY_TRACT | 3 refills | Status: AC
  Filled 2023-07-26: qty 18, 50d supply, fill #0
  Filled 2023-10-26: qty 18, 50d supply, fill #1

## 2023-07-31 ENCOUNTER — Other Ambulatory Visit: Payer: Self-pay

## 2023-08-01 ENCOUNTER — Other Ambulatory Visit: Payer: Self-pay

## 2023-08-01 ENCOUNTER — Inpatient Hospital Stay (HOSPITAL_COMMUNITY)
Admission: EM | Admit: 2023-08-01 | Discharge: 2023-08-05 | DRG: 640 | Disposition: A | Discharge status: Home / Self Care | Payer: MEDICAID | Attending: Internal Medicine | Admitting: Internal Medicine

## 2023-08-01 ENCOUNTER — Encounter (HOSPITAL_COMMUNITY): Payer: Self-pay

## 2023-08-01 ENCOUNTER — Emergency Department (HOSPITAL_COMMUNITY): Payer: MEDICAID

## 2023-08-01 DIAGNOSIS — E440 Moderate protein-calorie malnutrition: Secondary | ICD-10-CM | POA: Diagnosis present

## 2023-08-01 DIAGNOSIS — Z8349 Family history of other endocrine, nutritional and metabolic diseases: Secondary | ICD-10-CM

## 2023-08-01 DIAGNOSIS — Z820 Family history of epilepsy and other diseases of the nervous system: Secondary | ICD-10-CM

## 2023-08-01 DIAGNOSIS — K5090 Crohn's disease, unspecified, without complications: Secondary | ICD-10-CM | POA: Diagnosis present

## 2023-08-01 DIAGNOSIS — M109 Gout, unspecified: Secondary | ICD-10-CM | POA: Diagnosis present

## 2023-08-01 DIAGNOSIS — Z681 Body mass index (BMI) 19 or less, adult: Secondary | ICD-10-CM

## 2023-08-01 DIAGNOSIS — Z1152 Encounter for screening for COVID-19: Secondary | ICD-10-CM

## 2023-08-01 DIAGNOSIS — R2233 Localized swelling, mass and lump, upper limb, bilateral: Secondary | ICD-10-CM | POA: Diagnosis present

## 2023-08-01 DIAGNOSIS — Z789 Other specified health status: Secondary | ICD-10-CM | POA: Diagnosis present

## 2023-08-01 DIAGNOSIS — Z91411 Personal history of adult psychological abuse: Secondary | ICD-10-CM

## 2023-08-01 DIAGNOSIS — Z5941 Food insecurity: Secondary | ICD-10-CM

## 2023-08-01 DIAGNOSIS — R109 Unspecified abdominal pain: Secondary | ICD-10-CM

## 2023-08-01 DIAGNOSIS — Z880 Allergy status to penicillin: Secondary | ICD-10-CM

## 2023-08-01 DIAGNOSIS — F329 Major depressive disorder, single episode, unspecified: Secondary | ICD-10-CM | POA: Diagnosis present

## 2023-08-01 DIAGNOSIS — Z79899 Other long term (current) drug therapy: Secondary | ICD-10-CM

## 2023-08-01 DIAGNOSIS — R7989 Other specified abnormal findings of blood chemistry: Secondary | ICD-10-CM | POA: Diagnosis present

## 2023-08-01 DIAGNOSIS — Z720 Tobacco use: Secondary | ICD-10-CM | POA: Diagnosis present

## 2023-08-01 DIAGNOSIS — Z5982 Transportation insecurity: Secondary | ICD-10-CM

## 2023-08-01 DIAGNOSIS — Z91410 Personal history of adult physical and sexual abuse: Secondary | ICD-10-CM

## 2023-08-01 DIAGNOSIS — N200 Calculus of kidney: Secondary | ICD-10-CM | POA: Diagnosis present

## 2023-08-01 DIAGNOSIS — L03012 Cellulitis of left finger: Secondary | ICD-10-CM | POA: Diagnosis present

## 2023-08-01 DIAGNOSIS — M79604 Pain in right leg: Principal | ICD-10-CM

## 2023-08-01 DIAGNOSIS — I10 Essential (primary) hypertension: Secondary | ICD-10-CM | POA: Diagnosis present

## 2023-08-01 DIAGNOSIS — F1111 Opioid abuse, in remission: Secondary | ICD-10-CM | POA: Diagnosis present

## 2023-08-01 DIAGNOSIS — E876 Hypokalemia: Principal | ICD-10-CM

## 2023-08-01 DIAGNOSIS — Z8379 Family history of other diseases of the digestive system: Secondary | ICD-10-CM

## 2023-08-01 DIAGNOSIS — F1090 Alcohol use, unspecified, uncomplicated: Secondary | ICD-10-CM | POA: Diagnosis present

## 2023-08-01 DIAGNOSIS — F1021 Alcohol dependence, in remission: Secondary | ICD-10-CM | POA: Diagnosis present

## 2023-08-01 DIAGNOSIS — D539 Nutritional anemia, unspecified: Secondary | ICD-10-CM | POA: Diagnosis present

## 2023-08-01 DIAGNOSIS — F17210 Nicotine dependence, cigarettes, uncomplicated: Secondary | ICD-10-CM | POA: Diagnosis present

## 2023-08-01 DIAGNOSIS — G4733 Obstructive sleep apnea (adult) (pediatric): Secondary | ICD-10-CM | POA: Diagnosis present

## 2023-08-01 DIAGNOSIS — E43 Unspecified severe protein-calorie malnutrition: Secondary | ICD-10-CM | POA: Insufficient documentation

## 2023-08-01 DIAGNOSIS — G894 Chronic pain syndrome: Secondary | ICD-10-CM | POA: Diagnosis present

## 2023-08-01 DIAGNOSIS — R10A1 Flank pain, right side: Secondary | ICD-10-CM

## 2023-08-01 MED ORDER — AMLODIPINE BESYLATE 10 MG PO TABS
10.0000 mg | ORAL_TABLET | Freq: Every day | ORAL | Status: DC
  Administered 2023-08-02 – 2023-08-05 (×4): 10 mg via ORAL
  Filled 2023-08-01: qty 1
  Filled 2023-08-01: qty 2
  Filled 2023-08-01 (×2): qty 1

## 2023-08-01 MED ORDER — OXYCODONE-ACETAMINOPHEN 5-325 MG PO TABS
1.0000 | ORAL_TABLET | Freq: Once | ORAL | Status: AC
  Administered 2023-08-01: 1 via ORAL
  Filled 2023-08-01: qty 1

## 2023-08-01 MED ORDER — BUPRENORPHINE HCL 2 MG SL SUBL
8.0000 mg | SUBLINGUAL_TABLET | Freq: Every day | SUBLINGUAL | Status: DC
  Administered 2023-08-02: 4 mg via SUBLINGUAL
  Filled 2023-08-01: qty 1

## 2023-08-01 MED ORDER — TRAMADOL HCL 50 MG PO TABS
50.0000 mg | ORAL_TABLET | Freq: Three times a day (TID) | ORAL | 0 refills | Status: DC | PRN
  Filled 2023-08-07: qty 180, 30d supply, fill #0

## 2023-08-01 MED ORDER — ALBUTEROL SULFATE HFA 108 (90 BASE) MCG/ACT IN AERS: 2.0000 | INHALATION_SPRAY | Freq: Four times a day (QID) | RESPIRATORY_TRACT | Status: DC | PRN

## 2023-08-01 MED ORDER — PRAVASTATIN SODIUM 20 MG PO TABS
20.0000 mg | ORAL_TABLET | Freq: Every day | ORAL | Status: DC
  Administered 2023-08-02: 20 mg via ORAL
  Filled 2023-08-01: qty 1

## 2023-08-01 MED ORDER — ONDANSETRON 4 MG PO TBDP
ORAL_TABLET | ORAL | Status: AC
  Filled 2023-08-01: qty 1

## 2023-08-01 MED ORDER — BUPRENORPHINE HCL 2 MG SL SUBL: 8.0000 mg | SUBLINGUAL_TABLET | Freq: Two times a day (BID) | SUBLINGUAL | Status: DC

## 2023-08-01 MED ORDER — ONDANSETRON 4 MG PO TBDP
4.0000 mg | ORAL_TABLET | Freq: Once | ORAL | Status: AC | PRN
  Administered 2023-08-01: 4 mg via ORAL

## 2023-08-01 MED ORDER — ALBUTEROL SULFATE (2.5 MG/3ML) 0.083% IN NEBU: 2.5000 mg | INHALATION_SOLUTION | Freq: Four times a day (QID) | RESPIRATORY_TRACT | Status: DC | PRN

## 2023-08-01 MED ORDER — TRAMADOL HCL 50 MG PO TABS
50.0000 mg | ORAL_TABLET | Freq: Three times a day (TID) | ORAL | Status: DC | PRN
  Administered 2023-08-02 (×2): 50 mg via ORAL
  Filled 2023-08-01 (×2): qty 1

## 2023-08-01 NOTE — ED Provider Notes (Signed)
Emergency Department Provider Note   I have reviewed the triage vital signs and the nursing notes.   HISTORY  Chief Complaint Leg Pain   HPI [name] is a 52 y.o. female with PMH of chronic pain presents to the ED with worsening right hip pain.  Patient states that she has had worsening pain in her right hip keeping her from getting around well at home.  She lives with her husband who also has health issues and has difficulty helping her Foley.  She denies known falls.  No numbness or weakness.  Pain is mainly in the right hip.  She also has left hip discomfort but today her focus is on the right.  She is in pain management and taking her buprenorphine and tramadol.  She tells me that she has plenty of these medications but in triage apparently reported that she was out and could not get a refill until Thursday.  She tells me she saw her pain management doctor but is unsure if they discussed additional pain management options.   Past Medical History:  Diagnosis Date   Alcohol dependence in sustained full remission    Benign essential hypertension 04/09/2021   Chronic pain syndrome 04/09/2021   Crohn's disease - self reported. no biopsy to prove it    Dilated cbd, acquired 04/09/2021   Elevated troponin I level 09/12/2022   Gallstone pancreatitis 04/09/2021   Generalized anxiety disorder    Gout 04/09/2021   History of heroin use    Kidney stones    Major depressive disorder    Obstructive sleep apnea    Opiate withdrawal (HCC) - possible 09/12/2022    Review of Systems  Constitutional: No fever/chills Cardiovascular: Denies chest pain. Respiratory: Denies shortness of breath. Gastrointestinal: No abdominal pain.  No nausea, no vomiting.   Musculoskeletal: Positive right hip pain.  Skin: Negative for rash. Neurological: Negative for headaches.  ____________________________________________   PHYSICAL EXAM:  VITAL SIGNS: ED Triage Vitals  Encounter Vitals  Group     BP 08/01/23 1857 (!) 144/96     Pulse Rate 08/01/23 1857 (!) 101     Resp 08/01/23 1857 18     Temp 08/01/23 1857 99.5 F (37.5 C)     Temp Source 08/01/23 1857 Oral     SpO2 08/01/23 1857 100 %     Weight 08/01/23 1855 98 lb (44.5 kg)     Height 08/01/23 1855 5\' 6"  (1.676 m)   Constitutional: Alert and oriented. Tearful when I approach her hallway bed.  Eyes: Conjunctivae are normal. Head: Atraumatic. Nose: No congestion/rhinnorhea. Mouth/Throat: Mucous membranes are moist.   Neck: No stridor.  Cardiovascular: Normal rate, regular rhythm. Good peripheral circulation. 2+ DP pulses in the right foot.  Respiratory: Normal respiratory effort.   Gastrointestinal: No distention.  Musculoskeletal: Apparent discomfort with range of motion of the right hip but full range of motion. No gross deformities of extremities. Neurologic:  Normal speech and language. No gross focal neurologic deficits are appreciated.  Skin:  Skin is warm, dry and intact. No rash noted.  ____________________________________________  RADIOLOGY  CT Renal Stone Study  Result Date: 08/01/2023 CLINICAL DATA:  Abdominal pain.  Concern for kidney stone. EXAM: CT ABDOMEN AND PELVIS WITHOUT CONTRAST TECHNIQUE: Multidetector CT imaging of the abdomen and pelvis was performed following the standard protocol without IV contrast. RADIATION DOSE REDUCTION: This exam was performed according to the departmental dose-optimization program which includes automated exposure control, adjustment of the mA and/or  kV according to patient size and/or use of iterative reconstruction technique. COMPARISON:  CT abdomen pelvis dated 07/10/2023. FINDINGS: Evaluation of this exam is limited in the absence of intravenous contrast. Lower chest: Bibasilar linear atelectasis/scarring. There is coronary vascular calcification. No intra-abdominal free air or free fluid. Hepatobiliary: Fatty liver. No biliary ductal dilatation. The gallbladder is  distended. No calcified gallstone or pericholecystic fluid. Pancreas: Unremarkable. No pancreatic ductal dilatation or surrounding inflammatory changes. Spleen: Normal in size without focal abnormality. Adrenals/Urinary Tract: The adrenal glands are unremarkable. 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 Several nonobstructing bilateral renal calculi measure up to 3 mm. There is no hydronephrosis on either side. Subcentimeter left renal lesions are too small to characterize. The visualized ureters and urinary bladder appear unremarkable. Stomach/Bowel: There is thickened appearance of the proximal half of the colon with submucosal fat deposit, likely sequela of prior inflammatory process. Acute colitis is less likely. Clinical correlation is recommended. There is no bowel obstruction. Appendectomy. Vascular/Lymphatic: Mild aortoiliac atherosclerotic disease. The IVC is unremarkable. No portal venous gas. There is no adenopathy. Reproductive: The uterus is grossly unremarkable. No adnexal masses. Other: None Musculoskeletal: No acute osseous pathology. IMPRESSION: 1. Nonobstructing bilateral renal calculi. No hydronephrosis. 2. Fatty liver. 3. Thickened appearance of the proximal half of the colon with submucosal fat deposit, likely sequela of prior inflammatory process. Acute colitis is less likely. Clinical correlation is recommended. No bowel obstruction. 4.  Aortic Atherosclerosis (ICD10-I70.0). Electronically Signed   By: Elgie Collard M.D.   On: 08/01/2023 21:10   DG Hip Unilat W or Wo Pelvis 2-3 Views Right  Result Date: 08/01/2023 CLINICAL DATA:  Chronic right hip pain. EXAM: DG HIP (WITH OR WITHOUT PELVIS) 2-3V RIGHT COMPARISON:  April 24, 2009. FINDINGS: There is no evidence of hip fracture or dislocation. There is no evidence of arthropathy or other focal bone abnormality. IMPRESSION: Negative. Electronically Signed   By: Lupita Raider M.D.   On: 08/01/2023 19:51     ____________________________________________   PROCEDURES  Procedure(s) performed:   Procedures  None  ____________________________________________   INITIAL IMPRESSION / ASSESSMENT AND PLAN / ED COURSE  Pertinent labs & imaging results that were available during my care of the patient were reviewed by me and considered in my medical decision making (see chart for details).   This patient is Presenting for Evaluation of hip pain, which does require a range of treatment options, and is a complaint that involves a high risk of morbidity and mortality.  The Differential Diagnoses include chronic pain, hip fracture, contusion, sciatica, ureteral stone, etc.  Critical Interventions-    Medications  amLODipine (NORVASC) tablet 10 mg (has no administration in time range)  traMADol (ULTRAM) tablet 50 mg (has no administration in time range)  pravastatin (PRAVACHOL) tablet 20 mg (has no administration in time range)  albuterol (VENTOLIN HFA) 108 (90 Base) MCG/ACT inhaler 2 puff (has no administration in time range)  buprenorphine (SUBUTEX) SL tablet 8 mg (has no administration in time range)  oxyCODONE-acetaminophen (PERCOCET/ROXICET) 5-325 MG per tablet 1 tablet (1 tablet Oral Given 08/01/23 2010)  ondansetron (ZOFRAN-ODT) disintegrating tablet 4 mg (4 mg Oral Given 08/01/23 2010)    Reassessment after intervention:  pain slightly improved.   Radiologic Tests Ordered, included Hip XR. I independently interpreted the images and agree with radiology interpretation.   Cardiac Monitor Tracing which shows NSR.    Social Determinants of Health Risk patient is a smoker.   Consult complete  with TOC and PT for evaluation.   Medical Decision Making: Summary:  Patient presents to the emergency department with acute on chronic right hip pain.  No known fall.  No fevers or concern for septic joint.  Plan for x-ray, Percocet, reassess.  Reevaluation with update and discussion with  patient.  She tells me her pain remains moderate to severe and she is unable to walk due to pain.  Her husband is also disabled and unable to help her at home.  She does not feel safe with plan for discharge and home health.  Offered wheelchair DME but patient does not feel she can use that without assistance and in the condition of her home.  To keep patient until the morning for PT evaluation and TOC evaluation for possible SNF placement pending PT assessment.    Patient's presentation is most consistent with acute presentation with potential threat to life or bodily function.   Disposition: pending  ____________________________________________  FINAL CLINICAL IMPRESSION(S) / ED DIAGNOSES  Final diagnoses:  Right leg pain  Right flank pain    Note:  This document was prepared using Dragon voice recognition software and may include unintentional dictation errors.  Alona Bene, MD, [facility] Emergency Medicine    Eliott Amparan, Arlyss Repress, MD 08/01/23 2226

## 2023-08-01 NOTE — ED Notes (Signed)
11:17 PM  Report received from previous RN. This RN assumes care of the patient.

## 2023-08-01 NOTE — ED Triage Notes (Signed)
Patient reports chronic right hip/joint pain. Saw pain management yesterday, but couldn't get a refill until Thursday.

## 2023-08-02 ENCOUNTER — Emergency Department (HOSPITAL_COMMUNITY): Payer: MEDICAID

## 2023-08-02 DIAGNOSIS — R2233 Localized swelling, mass and lump, upper limb, bilateral: Secondary | ICD-10-CM | POA: Diagnosis present

## 2023-08-02 DIAGNOSIS — D539 Nutritional anemia, unspecified: Secondary | ICD-10-CM | POA: Diagnosis present

## 2023-08-02 DIAGNOSIS — R7989 Other specified abnormal findings of blood chemistry: Secondary | ICD-10-CM | POA: Diagnosis present

## 2023-08-02 DIAGNOSIS — E876 Hypokalemia: Secondary | ICD-10-CM | POA: Diagnosis present

## 2023-08-02 LAB — URINALYSIS, W/ REFLEX TO CULTURE (INFECTION SUSPECTED)
Bacteria, UA: NONE SEEN
Bilirubin Urine: NEGATIVE
Glucose, UA: NEGATIVE mg/dL
Ketones, ur: NEGATIVE mg/dL
Leukocytes,Ua: NEGATIVE
Nitrite: NEGATIVE
Protein, ur: NEGATIVE mg/dL
Specific Gravity, Urine: 1.013 (ref 1.005–1.030)
pH: 7 (ref 5.0–8.0)

## 2023-08-02 LAB — CBC
HCT: 28.1 % — ABNORMAL LOW (ref 36.0–46.0)
Hemoglobin: 10.1 g/dL — ABNORMAL LOW (ref 12.0–15.0)
MCH: 36.7 pg — ABNORMAL HIGH (ref 26.0–34.0)
MCHC: 35.9 g/dL (ref 30.0–36.0)
MCV: 102.2 fL — ABNORMAL HIGH (ref 80.0–100.0)
Platelets: 191 10*3/uL (ref 150–400)
RBC: 2.75 MIL/uL — ABNORMAL LOW (ref 3.87–5.11)
RDW: 15.5 % (ref 11.5–15.5)
WBC: 7.2 10*3/uL (ref 4.0–10.5)
nRBC: 0 % (ref 0.0–0.2)

## 2023-08-02 LAB — COMPREHENSIVE METABOLIC PANEL
ALT: 23 U/L (ref 0–44)
AST: 109 U/L — ABNORMAL HIGH (ref 15–41)
Albumin: 2.9 g/dL — ABNORMAL LOW (ref 3.5–5.0)
Alkaline Phosphatase: 133 U/L — ABNORMAL HIGH (ref 38–126)
Anion gap: 11 (ref 5–15)
BUN: 10 mg/dL (ref 6–20)
CO2: 31 mmol/L (ref 22–32)
Calcium: 7.3 mg/dL — ABNORMAL LOW (ref 8.9–10.3)
Chloride: 94 mmol/L — ABNORMAL LOW (ref 98–111)
Creatinine, Ser: 0.71 mg/dL (ref 0.44–1.00)
GFR, Estimated: >60 mL/min (ref >60)
Glucose, Bld: 137 mg/dL — ABNORMAL HIGH (ref 70–99)
Potassium: 2.7 mmol/L — CL (ref 3.5–5.1)
Sodium: 136 mmol/L (ref 135–145)
Total Bilirubin: 1.7 mg/dL — ABNORMAL HIGH (ref <1.2)
Total Protein: 6.2 g/dL — ABNORMAL LOW (ref 6.5–8.1)

## 2023-08-02 LAB — BASIC METABOLIC PANEL
Anion gap: 11 (ref 5–15)
BUN: 9 mg/dL (ref 6–20)
CO2: 26 mmol/L (ref 22–32)
Calcium: 6.8 mg/dL — ABNORMAL LOW (ref 8.9–10.3)
Chloride: 98 mmol/L (ref 98–111)
Creatinine, Ser: 0.75 mg/dL (ref 0.44–1.00)
GFR, Estimated: >60 mL/min (ref >60)
Glucose, Bld: 157 mg/dL — ABNORMAL HIGH (ref 70–99)
Potassium: 2.9 mmol/L — ABNORMAL LOW (ref 3.5–5.1)
Sodium: 135 mmol/L (ref 135–145)

## 2023-08-02 LAB — RESP PANEL BY RT-PCR (RSV, FLU A&B, COVID)  RVPGX2
Influenza A by PCR: NEGATIVE
Influenza B by PCR: NEGATIVE
Resp Syncytial Virus by PCR: NEGATIVE
SARS Coronavirus 2 by RT PCR: NEGATIVE

## 2023-08-02 LAB — I-STAT CG4 LACTIC ACID, ED: Lactic Acid, Venous: 1.4 mmol/L (ref 0.5–1.9)

## 2023-08-02 LAB — MAGNESIUM: Magnesium: 1.1 mg/dL — ABNORMAL LOW (ref 1.7–2.4)

## 2023-08-02 LAB — PHOSPHORUS: Phosphorus: 2.2 mg/dL — ABNORMAL LOW (ref 2.5–4.6)

## 2023-08-02 LAB — LIPASE, BLOOD: Lipase: 24 U/L (ref 11–51)

## 2023-08-02 MED ORDER — ONDANSETRON HCL 4 MG PO TABS
4.0000 mg | ORAL_TABLET | Freq: Four times a day (QID) | ORAL | Status: DC | PRN
  Administered 2023-08-04: 4 mg via ORAL
  Filled 2023-08-02: qty 1

## 2023-08-02 MED ORDER — ADULT MULTIVITAMIN W/MINERALS CH
1.0000 | ORAL_TABLET | Freq: Every day | ORAL | Status: DC
  Administered 2023-08-02 – 2023-08-05 (×4): 1 via ORAL
  Filled 2023-08-02 (×4): qty 1

## 2023-08-02 MED ORDER — TRAMADOL HCL 50 MG PO TABS
50.0000 mg | ORAL_TABLET | Freq: Once | ORAL | Status: AC
  Administered 2023-08-02: 50 mg via ORAL
  Filled 2023-08-02: qty 1

## 2023-08-02 MED ORDER — TRAMADOL HCL 50 MG PO TABS
50.0000 mg | ORAL_TABLET | Freq: Four times a day (QID) | ORAL | Status: DC | PRN
  Administered 2023-08-02 – 2023-08-05 (×7): 50 mg via ORAL
  Filled 2023-08-02 (×7): qty 1

## 2023-08-02 MED ORDER — POTASSIUM CHLORIDE CRYS ER 20 MEQ PO TBCR
40.0000 meq | EXTENDED_RELEASE_TABLET | Freq: Once | ORAL | Status: AC
  Administered 2023-08-02: 20 meq via ORAL
  Filled 2023-08-02: qty 2

## 2023-08-02 MED ORDER — K PHOS MONO-SOD PHOS DI & MONO 155-852-130 MG PO TABS
250.0000 mg | ORAL_TABLET | Freq: Four times a day (QID) | ORAL | Status: AC
  Administered 2023-08-02 – 2023-08-03 (×4): 250 mg via ORAL
  Filled 2023-08-02 (×4): qty 1

## 2023-08-02 MED ORDER — THIAMINE HCL 100 MG/ML IJ SOLN
100.0000 mg | Freq: Every day | INTRAMUSCULAR | Status: DC
  Filled 2023-08-02 (×2): qty 2

## 2023-08-02 MED ORDER — ONDANSETRON 4 MG PO TBDP
4.0000 mg | ORAL_TABLET | Freq: Once | ORAL | Status: AC
Start: 2023-08-02 — End: 2023-08-02
  Administered 2023-08-02: 4 mg via ORAL
  Filled 2023-08-02: qty 1

## 2023-08-02 MED ORDER — MAGNESIUM SULFATE 2 GM/50ML IV SOLN
2.0000 g | Freq: Once | INTRAVENOUS | Status: AC
  Administered 2023-08-02: 2 g via INTRAVENOUS
  Filled 2023-08-02: qty 50

## 2023-08-02 MED ORDER — FAMOTIDINE IN NACL 20-0.9 MG/50ML-% IV SOLN
20.0000 mg | Freq: Once | INTRAVENOUS | Status: AC
  Administered 2023-08-02: 20 mg via INTRAVENOUS
  Filled 2023-08-02: qty 50

## 2023-08-02 MED ORDER — POTASSIUM CHLORIDE IN NACL 40-0.9 MEQ/L-% IV SOLN
INTRAVENOUS | Status: DC
  Filled 2023-08-02: qty 1000

## 2023-08-02 MED ORDER — ONDANSETRON HCL 4 MG/2ML IJ SOLN
4.0000 mg | Freq: Four times a day (QID) | INTRAMUSCULAR | Status: DC | PRN
  Administered 2023-08-02 – 2023-08-05 (×6): 4 mg via INTRAVENOUS
  Filled 2023-08-02 (×7): qty 2

## 2023-08-02 MED ORDER — POTASSIUM CHLORIDE CRYS ER 20 MEQ PO TBCR
20.0000 meq | EXTENDED_RELEASE_TABLET | Freq: Once | ORAL | Status: AC
  Administered 2023-08-02: 20 meq via ORAL
  Filled 2023-08-02: qty 1

## 2023-08-02 MED ORDER — PROCHLORPERAZINE EDISYLATE 10 MG/2ML IJ SOLN
5.0000 mg | INTRAMUSCULAR | Status: DC | PRN
  Administered 2023-08-03: 5 mg via INTRAVENOUS
  Filled 2023-08-02: qty 2

## 2023-08-02 MED ORDER — BUPRENORPHINE HCL 8 MG SL SUBL
4.0000 mg | SUBLINGUAL_TABLET | Freq: Two times a day (BID) | SUBLINGUAL | Status: DC
  Administered 2023-08-02 – 2023-08-05 (×6): 4 mg via SUBLINGUAL
  Filled 2023-08-02 (×6): qty 1

## 2023-08-02 MED ORDER — ACETAMINOPHEN 650 MG RE SUPP: 650.0000 mg | Freq: Four times a day (QID) | RECTAL | Status: DC | PRN

## 2023-08-02 MED ORDER — FOLIC ACID 1 MG PO TABS
1.0000 mg | ORAL_TABLET | Freq: Every day | ORAL | Status: DC
Start: 2023-08-02 — End: 2023-08-05
  Administered 2023-08-03 – 2023-08-05 (×3): 1 mg via ORAL
  Filled 2023-08-02 (×4): qty 1

## 2023-08-02 MED ORDER — POTASSIUM CHLORIDE 10 MEQ/100ML IV SOLN
10.0000 meq | INTRAVENOUS | Status: AC
  Administered 2023-08-02 (×2): 10 meq via INTRAVENOUS
  Filled 2023-08-02: qty 100

## 2023-08-02 MED ORDER — METHOCARBAMOL 500 MG PO TABS
500.0000 mg | ORAL_TABLET | Freq: Four times a day (QID) | ORAL | Status: DC | PRN
  Administered 2023-08-02 – 2023-08-03 (×2): 500 mg via ORAL
  Filled 2023-08-02 (×2): qty 1

## 2023-08-02 MED ORDER — ACETAMINOPHEN 325 MG PO TABS
650.0000 mg | ORAL_TABLET | Freq: Four times a day (QID) | ORAL | Status: DC | PRN
  Administered 2023-08-02 – 2023-08-03 (×4): 650 mg via ORAL
  Filled 2023-08-02 (×4): qty 2

## 2023-08-02 MED ORDER — MAGNESIUM OXIDE -MG SUPPLEMENT 400 (240 MG) MG PO TABS
400.0000 mg | ORAL_TABLET | Freq: Once | ORAL | Status: AC
  Administered 2023-08-02: 400 mg via ORAL
  Filled 2023-08-02: qty 1

## 2023-08-02 MED ORDER — SODIUM CHLORIDE 0.9 % IV BOLUS
1000.0000 mL | Freq: Once | INTRAVENOUS | Status: AC
  Administered 2023-08-02: 1000 mL via INTRAVENOUS

## 2023-08-02 MED ORDER — PROCHLORPERAZINE EDISYLATE 10 MG/2ML IJ SOLN
7.5000 mg | Freq: Once | INTRAMUSCULAR | Status: AC
  Administered 2023-08-02: 7.5 mg via INTRAVENOUS
  Filled 2023-08-02: qty 2

## 2023-08-02 MED ORDER — POTASSIUM CHLORIDE 10 MEQ/100ML IV SOLN
10.0000 meq | INTRAVENOUS | Status: AC
  Administered 2023-08-02 (×2): 10 meq via INTRAVENOUS
  Filled 2023-08-02 (×2): qty 100

## 2023-08-02 MED ORDER — THIAMINE MONONITRATE 100 MG PO TABS
100.0000 mg | ORAL_TABLET | Freq: Every day | ORAL | Status: DC
Start: 2023-08-02 — End: 2023-08-05
  Administered 2023-08-02 – 2023-08-05 (×4): 100 mg via ORAL
  Filled 2023-08-02 (×4): qty 1

## 2023-08-02 NOTE — ED Notes (Signed)
ED TO INPATIENT HANDOFF REPORT  Name/Age/Gender [name] 52 y.o. female  Code Status    Code Status Orders  (From admission, onward)           Start     Ordered   08/02/23 1258  Full code  Continuous       Question:  By:  Answer:  Consent: discussion documented in EHR   08/02/23 1301           Code Status History     Date Active Date Inactive Code Status Order ID Comments User Context   07/10/2023 2347 07/13/2023 1711 Full Code [id number]  Gery Pray, MD ED   05/24/2023 2115 05/29/2023 1907 Full Code [id number]  Anselm Jungling, DO ED   01/20/2023 1955 01/22/2023 1824 Full Code [id number]  Darlin Drop, DO ED   09/12/2022 2324 09/19/2022 2337 Full Code [id number]  Carollee Herter, DO ED   04/09/2021 2015 04/14/2021 2316 Full Code [id number]  Standley Brooking, MD Inpatient       Home/SNF/Other Home  Chief Complaint Hypokalemia [E87.6]  Level of Care/Admitting Diagnosis ED Disposition     ED Disposition  Admit   Condition  --   Comment  Hospital Area: [facility] [[id number]]  Level of Care: Telemetry [5]  Admit to tele based on following criteria: Monitor QTC interval  May place patient in observation at [facility] or [facility] if equivalent level of care is available:: No  Covid Evaluation: Asymptomatic - no recent exposure (last 10 days) testing not required  Diagnosis: Hypokalemia [725366]  Admitting Physician: Bobette Mo [[id number]]  Attending Physician: Bobette Mo [[id number]]          Medical History Past Medical History:  Diagnosis Date   Alcohol dependence in sustained full remission    Benign essential hypertension 04/09/2021   Chronic pain syndrome 04/09/2021   Crohn's disease - self reported. no biopsy to prove it    Dilated cbd, acquired 04/09/2021   Elevated troponin I level 09/12/2022   Gallstone pancreatitis 04/09/2021   Generalized anxiety disorder    Gout 04/09/2021   History of heroin use    Kidney  stones    Major depressive disorder    Obstructive sleep apnea    Opiate withdrawal (HCC) - possible 09/12/2022    Allergies Allergies  Allergen Reactions   Penicillins Hives    IV Location/Drains/Wounds Patient Lines/Drains/Airways Status     Active Line/Drains/Airways     Name Placement date Placement time Site Days   Peripheral IV 08/02/23 20 G Distal;Posterior;Right Forearm 08/02/23  1032  Forearm  less than 1            Labs/Imaging Results for orders placed or performed during the hospital encounter of 08/01/23 (from the past 48 hour(s))  Urinalysis, w/ Reflex to Culture (Infection Suspected) -Urine, Clean Catch     Status: Abnormal   Collection Time: 08/01/23 11:17 AM  Result Value Ref Range   Specimen Source URINE, CLEAN CATCH    Color, Urine AMBER (A) YELLOW    Comment: BIOCHEMICALS MAY BE AFFECTED BY COLOR   APPearance CLEAR CLEAR   Specific Gravity, Urine 1.013 1.005 - 1.030   pH 7.0 5.0 - 8.0   Glucose, UA NEGATIVE NEGATIVE mg/dL   Hgb urine dipstick SMALL (A) NEGATIVE   Bilirubin Urine NEGATIVE NEGATIVE   Ketones, ur NEGATIVE NEGATIVE mg/dL   Protein, ur NEGATIVE NEGATIVE mg/dL   Nitrite NEGATIVE NEGATIVE   Leukocytes,Ua  NEGATIVE NEGATIVE   RBC / HPF 0-5 0 - 5 RBC/hpf   WBC, UA 0-5 0 - 5 WBC/hpf    Comment:        Reflex urine culture not performed if WBC <=10, OR if Squamous epithelial cells >5. If Squamous epithelial cells >5 suggest recollection.    Bacteria, UA NONE SEEN NONE SEEN   Squamous Epithelial / HPF 0-5 0 - 5 /HPF    Comment: Performed at [facility], [address] [place], [place] [address]  Resp panel by RT-PCR (RSV, Flu A&B, Covid) Anterior Nasal Swab     Status: None   Collection Time: 08/02/23 10:09 AM   Specimen: Anterior Nasal Swab  Result Value Ref Range   SARS Coronavirus 2 by RT PCR NEGATIVE NEGATIVE    Comment: (NOTE) SARS-CoV-2 target nucleic acids are NOT DETECTED.  The SARS-CoV-2 RNA is  generally detectable in upper respiratory specimens during the acute phase of infection. The lowest concentration of SARS-CoV-2 viral copies this assay can detect is 138 copies/mL. A negative result does not preclude SARS-Cov-2 infection and should not be used as the sole basis for treatment or other patient management decisions. A negative result may occur with  improper specimen collection/handling, submission of specimen other than nasopharyngeal swab, presence of viral mutation(s) within the areas targeted by this assay, and inadequate number of viral copies(<138 copies/mL). A negative result must be combined with clinical observations, patient history, and epidemiological information. The expected result is Negative.  Fact Sheet for Patients:  BloggerCourse.com  Fact Sheet for Healthcare Providers:  SeriousBroker.it  This test is no t yet approved or cleared by the [place] FDA and  has been authorized for detection and/or diagnosis of SARS-CoV-2 by FDA under an Emergency Use Authorization (EUA). This EUA will remain  in effect (meaning this test can be used) for the duration of the COVID-19 declaration under Section 564(b)(1) of the Act, 21 U.S.C.section 360bbb-3(b)(1), unless the authorization is terminated  or revoked sooner.       Influenza A by PCR NEGATIVE NEGATIVE   Influenza B by PCR NEGATIVE NEGATIVE    Comment: (NOTE) The Xpert Xpress SARS-CoV-2/FLU/RSV plus assay is intended as an aid in the diagnosis of influenza from Nasopharyngeal swab specimens and should not be used as a sole basis for treatment. Nasal washings and aspirates are unacceptable for Xpert Xpress SARS-CoV-2/FLU/RSV testing.  Fact Sheet for Patients: BloggerCourse.com  Fact Sheet for Healthcare Providers: SeriousBroker.it  This test is not yet approved or cleared by the [place] FDA  and has been authorized for detection and/or diagnosis of SARS-CoV-2 by FDA under an Emergency Use Authorization (EUA). This EUA will remain in effect (meaning this test can be used) for the duration of the COVID-19 declaration under Section 564(b)(1) of the Act, 21 U.S.C. section 360bbb-3(b)(1), unless the authorization is terminated or revoked.     Resp Syncytial Virus by PCR NEGATIVE NEGATIVE    Comment: (NOTE) Fact Sheet for Patients: BloggerCourse.com  Fact Sheet for Healthcare Providers: SeriousBroker.it  This test is not yet approved or cleared by the [place] FDA and has been authorized for detection and/or diagnosis of SARS-CoV-2 by FDA under an Emergency Use Authorization (EUA). This EUA will remain in effect (meaning this test can be used) for the duration of the COVID-19 declaration under Section 564(b)(1) of the Act, 21 U.S.C. section 360bbb-3(b)(1), unless the authorization is terminated or revoked.  Performed at [facility], [address]  [place], [place] [address]   CBC     Status: Abnormal   Collection Time: 08/02/23 10:43 AM  Result Value Ref Range   WBC 7.2 4.0 - 10.5 K/uL   RBC 2.75 (L) 3.87 - 5.11 MIL/uL   Hemoglobin 10.1 (L) 12.0 - 15.0 g/dL   HCT 60.4 (L) 54.0 - 98.1 %   MCV 102.2 (H) 80.0 - 100.0 fL   MCH 36.7 (H) 26.0 - 34.0 pg   MCHC 35.9 30.0 - 36.0 g/dL   RDW 19.1 47.8 - 29.5 %   Platelets 191 150 - 400 K/uL   nRBC 0.0 0.0 - 0.2 %    Comment: Performed at [facility], [address], [place], [place] [address]  Comprehensive metabolic panel     Status: Abnormal   Collection Time: 08/02/23 10:43 AM  Result Value Ref Range   Sodium 136 135 - 145 mmol/L   Potassium 2.7 (LL) 3.5 - 5.1 mmol/L    Comment: CRITICAL RESULT CALLED TO, READ BACK BY AND VERIFIED WITH GODAI, A. RN AT 1147 08/02/23 NICANOR, V.    Chloride 94 (L) 98 - 111 mmol/L   CO2 31 22  - 32 mmol/L   Glucose, Bld 137 (H) 70 - 99 mg/dL    Comment: Glucose reference range applies only to samples taken after fasting for at least 8 hours.   BUN 10 6 - 20 mg/dL   Creatinine, Ser 8.65 0.44 - 1.00 mg/dL   Calcium 7.3 (L) 8.9 - 10.3 mg/dL   Total Protein 6.2 (L) 6.5 - 8.1 g/dL   Albumin 2.9 (L) 3.5 - 5.0 g/dL   AST 784 (H) 15 - 41 U/L   ALT 23 0 - 44 U/L   Alkaline Phosphatase 133 (H) 38 - 126 U/L   Total Bilirubin 1.7 (H) <1.2 mg/dL   GFR, Estimated >69 >62 mL/min    Comment: (NOTE) Calculated using the CKD-EPI Creatinine Equation (2021)    Anion gap 11 5 - 15    Comment: Performed at [facility], [address], [place], [place] [address]  Lipase, blood     Status: None   Collection Time: 08/02/23 10:43 AM  Result Value Ref Range   Lipase 24 11 - 51 U/L    Comment: Performed at [facility], [address] [place], [place] [address]  I-Stat CG4 Lactic Acid     Status: None   Collection Time: 08/02/23 10:50 AM  Result Value Ref Range   Lactic Acid, Venous 1.4 0.5 - 1.9 mmol/L   DG Chest Portable 1 View  Result Date: 08/02/2023 CLINICAL DATA:  Fever, shortness of breath EXAM: PORTABLE CHEST 1 VIEW COMPARISON:  07/10/2023 FINDINGS: The heart size and mediastinal contours are within normal limits. Both lungs are clear. The visualized skeletal structures are unremarkable. IMPRESSION: No active disease. Electronically Signed   By: Duanne Guess D.O.   On: 08/02/2023 11:22   CT Renal Stone Study  Result Date: 08/01/2023 CLINICAL DATA:  Abdominal pain.  Concern for kidney stone. EXAM: CT ABDOMEN AND PELVIS WITHOUT CONTRAST TECHNIQUE: Multidetector CT imaging of the abdomen and pelvis was performed following the standard protocol without IV contrast. RADIATION DOSE REDUCTION: This exam was performed according to the departmental dose-optimization program which includes automated exposure control, adjustment of the mA and/or kV  according to patient size and/or use of iterative reconstruction technique. COMPARISON:  CT abdomen pelvis dated 07/10/2023. FINDINGS: Evaluation of this exam is limited in  the absence of intravenous contrast. Lower chest: Bibasilar linear atelectasis/scarring. There is coronary vascular calcification. No intra-abdominal free air or free fluid. Hepatobiliary: Fatty liver. No biliary ductal dilatation. The gallbladder is distended. No calcified gallstone or pericholecystic fluid. Pancreas: Unremarkable. No pancreatic ductal dilatation or surrounding inflammatory changes. Spleen: Normal in size without focal abnormality. Adrenals/Urinary Tract: The adrenal glands are unremarkable. 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 Several nonobstructing bilateral renal calculi measure up to 3 mm. There is no hydronephrosis on either side. Subcentimeter left renal lesions are too small to characterize. The visualized ureters and urinary bladder appear unremarkable. Stomach/Bowel: There is thickened appearance of the proximal half of the colon with submucosal fat deposit, likely sequela of prior inflammatory process. Acute colitis is less likely. Clinical correlation is recommended. There is no bowel obstruction. Appendectomy. Vascular/Lymphatic: Mild aortoiliac atherosclerotic disease. The IVC is unremarkable. No portal venous gas. There is no adenopathy. Reproductive: The uterus is grossly unremarkable. No adnexal masses. Other: None Musculoskeletal: No acute osseous pathology. IMPRESSION: 1. Nonobstructing bilateral renal calculi. No hydronephrosis. 2. Fatty liver. 3. Thickened appearance of the proximal half of the colon with submucosal fat deposit, likely sequela of prior inflammatory process. Acute colitis is less likely. Clinical correlation is recommended. No bowel obstruction. 4.  Aortic Atherosclerosis (ICD10-I70.0). Electronically Signed   By: Elgie Collard M.D.   On: 08/01/2023 21:10   DG Hip  Unilat W or Wo Pelvis 2-3 Views Right  Result Date: 08/01/2023 CLINICAL DATA:  Chronic right hip pain. EXAM: DG HIP (WITH OR WITHOUT PELVIS) 2-3V RIGHT COMPARISON:  April 24, 2009. FINDINGS: There is no evidence of hip fracture or dislocation. There is no evidence of arthropathy or other focal bone abnormality. IMPRESSION: Negative. Electronically Signed   By: Lupita Raider M.D.   On: 08/01/2023 19:51    Pending Labs Unresulted Labs (From admission, onward)     Start     Ordered   08/03/23 0500  Comprehensive metabolic panel  Tomorrow morning,   R        08/02/23 1301   08/03/23 0500  CBC  Tomorrow morning,   R        08/02/23 1301   08/02/23 1800  Basic metabolic panel  Once-Timed,   TIMED        08/02/23 1301   08/02/23 1257  Magnesium  Add-on,   AD        08/02/23 1301   08/02/23 1257  Phosphorus  Add-on,   AD        08/02/23 1301            Vitals/Pain Today's Vitals   08/02/23 1008 08/02/23 1009 08/02/23 1104 08/02/23 1158  BP: (!) 140/97 (!) 140/97  (!) 161/112  Pulse:  91  86  Resp:  19  17  Temp:  (!) 100.9 F (38.3 C)  99.4 F (37.4 C)  TempSrc:  Oral  Oral  SpO2:  100%  96%  Weight:      Height:      PainSc:   9      Isolation Precautions No active isolations  Medications Medications  ondansetron (ZOFRAN-ODT) 4 MG disintegrating tablet ( Oral Canceled Entry 08/02/23 0230)  amLODipine (NORVASC) tablet 10 mg (10 mg Oral Given 08/02/23 1008)  traMADol (ULTRAM) tablet 50 mg (50 mg Oral Given 08/02/23 1007)  pravastatin (PRAVACHOL) tablet 20 mg (20 mg Oral Given 08/02/23 1014)  albuterol (PROVENTIL) (2.5 MG/3ML) 0.083% nebulizer solution 2.5  mg (has no administration in time range)  buprenorphine (SUBUTEX) SL tablet 4 mg (has no administration in time range)  potassium chloride 10 mEq in 100 mL IVPB (has no administration in time range)  sodium chloride 0.9 % bolus 1,000 mL (has no administration in time range)  0.9 % NaCl with KCl 40 mEq / L  infusion  (has no administration in time range)  ondansetron (ZOFRAN) tablet 4 mg (has no administration in time range)    Or  ondansetron (ZOFRAN) injection 4 mg (has no administration in time range)  acetaminophen (TYLENOL) tablet 650 mg (has no administration in time range)    Or  acetaminophen (TYLENOL) suppository 650 mg (has no administration in time range)  famotidine (PEPCID) IVPB 20 mg premix (has no administration in time range)  oxyCODONE-acetaminophen (PERCOCET/ROXICET) 5-325 MG per tablet 1 tablet (1 tablet Oral Given 08/01/23 2010)  ondansetron (ZOFRAN-ODT) disintegrating tablet 4 mg (4 mg Oral Given 08/01/23 2010)  ondansetron (ZOFRAN-ODT) disintegrating tablet 4 mg (4 mg Oral Given 08/02/23 1013)  potassium chloride SA (KLOR-CON M) CR tablet 40 mEq (20 mEq Oral Given 08/02/23 1248)  magnesium oxide (MAG-OX) tablet 400 mg (400 mg Oral Given 08/02/23 1250)    Mobility non-ambulatory

## 2023-08-02 NOTE — Progress Notes (Signed)
Pt faxed out for STR at SNF to see if any facility is in network.

## 2023-08-02 NOTE — ED Provider Notes (Signed)
Emergency Medicine Observation Re-evaluation Note  [name] is a 52 y.o. female, seen on rounds today.  Pt initially presented to the ED for complaints of Leg Pain Currently, the patient is still complaining of nausea as well as continued pain in hip.  Physical Exam  BP (!) 161/112   Pulse 86   Temp 99.4 F (37.4 C) (Oral)   Resp 17   Ht 5\' 6"  (1.676 m)   Wt 44.5 kg   SpO2 96%   BMI 15.82 kg/m  Physical Exam General: Well-appearing Cardiac: Normal rate Lungs: Clear lungs Abd. soft, nontender  ED Course / MDM  EKG:EKG Interpretation Date/Time:  Wednesday August 02 2023 11:56:51 EST Ventricular Rate:  90 PR Interval:  126 QRS Duration:  85 QT Interval:  425 QTC Calculation: 521 R Axis:   64  Text Interpretation: Sinus rhythm Anteroseptal infarct, age indeterminate Prolonged QT interval Confirmed by Estanislado Pandy [phone]) on 08/02/2023 12:13:20 PM  I have reviewed the labs performed to date as well as medications administered while in observation.  Recent changes in the last 24 hours include continues complain of nausea and 1 episode of vomiting.  Plan  Patient reevaluated this morning.  Continues to have some nausea and vomiting overnight.  Does not appear that she had lab work done at that time.  Spiked a fever here.  She has no overt sources of infection on exam.  Ordered lab workup to evaluate for infectious source.  Flu/COVID/RSV negative.  Chest x-ray without pneumonia.  UA without UTI.  She has no significant leukocytosis or elevated lactate.  Stable anemia.  Comprehensive panel however did reveal potassium of 2.7 the decreased chloride which would go along with patient's history of nausea and vomiting.  Has benign abdominal exam, minor elevation in AST ALT slightly up compared to prior.  Lipase normal.  Pancreatitis unlikely.  Given patient's hypokalemia in the setting of nausea vomiting we will admit for further management.   Coral Spikes, DO 08/02/23 1224

## 2023-08-02 NOTE — TOC Progression Note (Addendum)
Transition of Care [facility]) - Progression Note    Patient Details  Name: AMAJA HAVERFIELD MRN: [id number] Date of Birth: 05-09-1971  Transition of [facility] CM/SW Contact  Beckie Busing, RN Phone [phone]  08/02/2023, 2:48 PM  Clinical Narrative:    [facility] acknowledges consult for " Patient with chronic pain and unable to walk. No assist at home " & Home Health / DME Needs. Patient info has been faxed out but there are currently no bed offers. Barriers to bed offers include IV drug use, ETOH abuse, suboxone use and insurance out of network. TOC will continue to follow and expand bed search.         Expected Discharge Plan and Services                                               Social Determinants of Health (SDOH) Interventions SDOH Screenings   Food Insecurity: No Food Insecurity (08/02/2023)  Recent Concern: Food Insecurity - Food Insecurity Present (05/25/2023)  Housing: Low Risk  (08/02/2023)  Recent Concern: Housing - Medium Risk (05/25/2023)  Transportation Needs: No Transportation Needs (08/02/2023)  Recent Concern: Transportation Needs - Unmet Transportation Needs (05/25/2023)  Utilities: Not At Risk (08/02/2023)  Recent Concern: Utilities - At Risk (05/25/2023)  Alcohol Screen: Low Risk  (04/22/2021)  Depression (PHQ2-9): Low Risk  (01/04/2022)  Tobacco Use: High Risk (08/01/2023)    Readmission Risk Interventions     No data to display

## 2023-08-02 NOTE — ED Notes (Signed)
Patient on bedpan

## 2023-08-02 NOTE — NC FL2 (Signed)
[place] MEDICAID FL2 LEVEL OF CARE FORM     IDENTIFICATION  Patient Name: [name] Birthdate: 1971/03/22 Sex: female Admission Date (Current Location): 08/01/2023  [facility] and [place] Number:  Producer, television/film/video and Address:  [facility],  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 no biopsy proving Crohn's disease. small biopsy on 03-18-2003    Obstructive sleep apnea    Gout 04/09/2021   Dilated cbd, acquired 04/09/2021   Chronic pain syndrome 04/09/2021   Benign essential hypertension 04/09/2021   Alcohol dependence in sustained full remission     Orientation RESPIRATION BLADDER Height & Weight     Self, Time, Situation, Place  Normal Continent Weight: 98 lb (44.5 kg) Height:  5\' 6"  (167.6 cm)  BEHAVIORAL SYMPTOMS/MOOD NEUROLOGICAL BOWEL NUTRITION STATUS      Continent Diet (Regular)  AMBULATORY STATUS COMMUNICATION OF NEEDS Skin   Extensive Assist   Normal                       Personal Care Assistance Level of Assistance  Bathing, Feeding, Dressing Bathing Assistance: Limited assistance Feeding assistance: Independent Dressing Assistance: Limited assistance     Functional Limitations Info  Sight, Hearing, Speech Sight Info:  Adequate Hearing Info: Adequate Speech Info: Adequate    SPECIAL CARE FACTORS FREQUENCY  PT (By licensed PT), OT (By licensed OT)     PT Frequency: x5/week OT Frequency: x5/week            Contractures Contractures Info: Not present    Additional Factors Info  Code Status, Allergies Code Status Info: Full Allergies Info: Penicillins           Current Medications (08/02/2023):  This is the current hospital active medication list Current Facility-Administered Medications  Medication Dose Route Frequency Provider Last Rate Last Admin   albuterol (PROVENTIL) (2.5 MG/3ML) 0.083% nebulizer solution 2.5 mg  2.5 mg Nebulization Q6H PRN Long, Arlyss Repress, MD       amLODipine (NORVASC) tablet 10 mg  10 mg Oral Daily Long, Arlyss Repress, MD   10 mg at 08/02/23 1008   buprenorphine (SUBUTEX) SL tablet 8 mg  8 mg Sublingual Daily Long, Arlyss Repress, MD   4 mg at 08/02/23 1028   pravastatin (PRAVACHOL) tablet 20 mg  20 mg Oral Daily Long, Arlyss Repress, MD   20 mg at 08/02/23 1014   traMADol (ULTRAM) tablet 50 mg  50 mg Oral Q8H PRN Long, Arlyss Repress, MD   50 mg at 08/02/23 1007   Current Outpatient Medications  Medication Sig Dispense Refill   albuterol (VENTOLIN HFA) 108 (90 Base) MCG/ACT inhaler Inhale 2 puffs into the lungs 2 (two) times daily. 18 g 3  amLODipine (NORVASC) 10 MG tablet Take 1 tablet (10 mg total) by mouth daily. 30 tablet 1   buprenorphine (SUBUTEX) 8 MG SUBL SL tablet DISSOLVE 1/2 TABLET UNDER THE TOUNGE TWICE A DAY 30 tablet 0   loperamide (IMODIUM) 2 MG capsule Take 2 capsules (4 mg total) by mouth every 6 (six) hours as needed for diarrhea or loose stools. 30 capsule 0   pravastatin (PRAVACHOL) 20 MG tablet Take 1 tablet (20 mg total) by mouth daily. 30 tablet 2   traMADol (ULTRAM) 50 MG tablet Take 1-2 tablets (50-100 mg total) by mouth every 8 (eight) hours as needed. 180 tablet 0   [START ON 08/04/2023] traMADol (ULTRAM) 50 MG tablet Take 1-2 tablets (50-100 mg total) by mouth  every 8 (eight) hours as needed. 180 tablet 0     Discharge Medications: Please see discharge summary for a list of discharge medications.  Relevant Imaging Results:  Relevant Lab Results:   Additional Information SSN: [id number]  Princella Ion, LCSW
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
## 2023-08-02 NOTE — Evaluation (Signed)
Physical Therapy Evaluation Patient Details Name: [name] MRN: [id number] DOB: 05-Aug-1971 Today's Date: 08/02/2023  History of Present Illness  [name] is a 52 y.o. female with PMH of chronic pain , heroine use, on suboxone HTN, Crohn's dis., gout,  OSA,depression, hypokalemia, presents 08/01/23 to the ED with worsening right hip pain.  Patient states that she has had worsening pain in her right hip with difficulty ambulating.  Clinical Impression  Pt admitted with above diagnosis.  Pt currently with functional limitations due to the deficits listed below (see PT Problem List). Pt will benefit from acute skilled PT to increase their independence and safety with mobility to allow discharge.     The patient is limited in eval due to reports of right hip pain , "tailbone" and low back. Patient limited in trunk rotation, crying out when rolling.  Patient  uses a cane or rollator as needed.  Patient will benefit from continued inpatient follow up therapy, <3 hours/day.  Patient reports family member unable to assist her due to medical issues.        If plan is discharge home, recommend the following: A lot of help with bathing/dressing/bathroom;A little help with walking and/or transfers;Help with stairs or ramp for entrance;Assistance with cooking/housework;Assist for transportation   Can travel by private vehicle        Equipment Recommendations None recommended by PT  Recommendations for Other Services       Functional Status Assessment Patient has had a recent decline in their functional status and demonstrates the ability to make significant improvements in function in a reasonable and predictable amount of time.     Precautions / Restrictions Precautions Precautions: Fall      Mobility  Bed Mobility Overal bed mobility: Needs Assistance Bed Mobility: Rolling, Sidelying to Sit, Sit to Supine Rolling: Max assist Sidelying to sit: Max assist   Sit to supine:  Max assist   General bed mobility comments: patient limited in rolling to left side, assisted  trunk and legs to sitting position and back into bed.    Transfers Overall transfer level: Needs assistance Equipment used: Rolling walker (2 wheels) Transfers: Sit to/from Stand Sit to Stand: Mod assist           General transfer comment: patient stood briefly with weight shifted to the  left leg, did not attempt to step.    Ambulation/Gait                  Stairs            Wheelchair Mobility     Tilt Bed    Modified Rankin (Stroke Patients Only)       Balance Overall balance assessment: Needs assistance Sitting-balance support: Bilateral upper extremity supported, Feet supported   Sitting balance - Comments: patient bracing and guarding   when sitting on bed edge   Standing balance support: During functional activity, Bilateral upper extremity supported Standing balance-Leahy Scale: Poor Standing balance comment: limited on weight bear on RLE                             Pertinent Vitals/Pain Pain Assessment Pain Assessment: 0-10 Pain Score: 10-Worst pain ever Pain Location: right hip and 'tailbone", across the low back Pain Descriptors / Indicators: Aching, Crushing, Crying Pain Intervention(s): Heat applied, Repositioned    Home Living Family/patient expects to be discharged to:: Private residence Living Arrangements: Spouse/significant other Available Help  at Discharge: Friend(s);Available PRN/intermittently Type of Home: House Home Access: Stairs to enter Entrance Stairs-Rails: Doctor, general practice of Steps: 4-5   Home Layout: One level Home Equipment: Rollator (4 wheels);Shower seat;Cane - single point      Prior Function Prior Level of Function : Independent/Modified Independent             Mobility Comments: SPC in the home, rollator outside of the home ADLs Comments: mod I, states relies on outside  transportation     Extremity/Trunk Assessment        Lower Extremity Assessment Lower Extremity Assessment: RLE deficits/detail RLE Deficits / Details: unable to bear weight  when stasnding, does not tolerate hiip flexion  in supine > 40*,    Cervical / Trunk Assessment Cervical / Trunk Assessment: Other exceptions Cervical / Trunk Exceptions: rotation of lower trunk is limited with increased reports of pain  Communication   Communication Communication: No apparent difficulties  Cognition Arousal: Alert Behavior During Therapy: WFL for tasks assessed/performed, Anxious, Restless Overall Cognitive Status: Within Functional Limits for tasks assessed                                          General Comments      Exercises     Assessment/Plan    PT Assessment Patient needs continued PT services  PT Problem List Decreased mobility;Decreased range of motion;Decreased knowledge of precautions;Decreased activity tolerance;Pain       PT Treatment Interventions DME instruction;Therapeutic activities;Gait training;Therapeutic exercise;Patient/family education;Functional mobility training    PT Goals (Current goals can be found in the Care Plan section)  Acute Rehab PT Goals Patient Stated Goal: to walk, no pain PT Goal Formulation: With patient Time For Goal Achievement: 08/16/23 Potential to Achieve Goals: Fair    Frequency Min 1X/week     Co-evaluation               AM-PAC PT "6 Clicks" Mobility  Outcome Measure Help needed turning from your back to your side while in a flat bed without using bedrails?: A Lot Help needed moving from lying on your back to sitting on the side of a flat bed without using bedrails?: A Lot Help needed moving to and from a bed to a chair (including a wheelchair)?: A Lot Help needed standing up from a chair using your arms (e.g., wheelchair or bedside chair)?: Total Help needed to walk in hospital room?: Total Help  needed climbing 3-5 steps with a railing? : Total 6 Click Score: 9    End of Session Equipment Utilized During Treatment: Gait belt Activity Tolerance: Patient limited by pain Patient left: in bed;with call bell/phone within reach Nurse Communication: Mobility status (nausea) PT Visit Diagnosis: Unsteadiness on feet (R26.81);Pain    Time: 4782-9562 PT Time Calculation (min) (ACUTE ONLY): 27 min   Charges:   PT Evaluation $PT Eval Low Complexity: 1 Low PT Treatments $Therapeutic Activity: 8-22 mins PT General Charges $$ ACUTE PT VISIT: 1 Visit         Blanchard Kelch PT Acute Rehabilitation Services Office [phone] Weekend pager-[phone]   Rada Hay 08/02/2023, 10:12 AM

## 2023-08-02 NOTE — Plan of Care (Signed)
Patient admitted to 1603, vital signs stable, patient unable to tolerate walking or scooting from stretcher to the bed. Pure wick is in place, patient in 10/10 pain. Sacrum intact but bony so sacral mepliex in place. Skin assessment completed with nurse Melody at the bedside. PIV's intact and bolus continuous.   Problem: Education: Goal: Knowledge of General Education information will improve Description: Including pain rating scale, medication(s)/side effects and non-pharmacologic comfort measures Outcome: Progressing   Problem: Health Behavior/Discharge Planning: Goal: Ability to manage health-related needs will improve Outcome: Progressing   Problem: Clinical Measurements: Goal: Ability to maintain clinical measurements within normal limits will improve Outcome: Progressing Goal: Will remain free from infection Outcome: Progressing Goal: Diagnostic test results will improve Outcome: Progressing Goal: Respiratory complications will improve Outcome: Progressing Goal: Cardiovascular complication will be avoided Outcome: Progressing   Problem: Activity: Goal: Risk for activity intolerance will decrease Outcome: Progressing   Problem: Nutrition: Goal: Adequate nutrition will be maintained Outcome: Progressing   Problem: Coping: Goal: Level of anxiety will decrease Outcome: Progressing   Problem: Elimination: Goal: Will not experience complications related to bowel motility Outcome: Progressing Goal: Will not experience complications related to urinary retention Outcome: Progressing   Problem: Pain Management: Goal: General experience of comfort will improve Outcome: Progressing   Problem: Safety: Goal: Ability to remain free from injury will improve Outcome: Progressing   Problem: Skin Integrity: Goal: Risk for impaired skin integrity will decrease Outcome: Progressing

## 2023-08-02 NOTE — H&P (Signed)
History and Physical    Patient: [name]:[id number] DOB: 14-Feb-1971 DOA: 08/01/2023 DOS: the patient was seen and examined on 08/02/2023 [facility]: [facility]  Patient coming from: Home  Chief Complaint:  Chief Complaint  Patient presents with   Leg Pain   HPI: [name] is a 52 y.o. female with medical history significant of alcohol abuse, benign hypertension chronic pain syndrome, Crohn's disease, gallstone pancreatitis, acquired dilated CBD, elevated ammonia level, generalized anxiety disorder, depression, gout, history of heroin use in remission, history of opiate withdrawals, nephrolithiasis, obstructive sleep apnea not on CPAP who presented to the emergency department with complaints of right hip pain, nausea, vomiting and diarrhea for the past 3 days. No constipation, melena or hematochezia.  No flank pain, dysuria, frequency or hematuria. She denied fever, chills, rhinorrhea, sore throat, wheezing or hemoptysis.  No IVD use in 20 years.  No chest pain, palpitations, diaphoresis, PND, orthopnea or pitting edema of the lower extremities. No polyuria, polydipsia, polyphagia or blurred vision.   Lab work: Urinalysis shows small hemoglobin but was otherwise unremarkable.  Negative coronavirus, influenza and RSV PCR.  CBC showed white count 7.2, hemoglobin 10.1 g/dL with an MCV of 295.2 fL and platelets 191.  CMP showed sodium 136, potassium 2.7, chloride 94 and CO2 31 mmol/L.  Renal function was normal.  Glucose 137, corrected calcium 8.5 and total bilirubin 1.7 mg/dL.  Lipase is normal.  Magnesium was 1.1 and phosphorus 2.2 mg/dL.  Imaging: Right hip x-ray was negative.  Portable 1 view chest radiograph with no active disease.  Normal heart size, mediastinal contours and clear lungs.  CT renal study showing nonobstructing bilateral renal calculi.  No hydronephrosis.  Fatty liver.  There is thickening appearance of the proximal half of the colon with submucosal fat  deposit, likely sequela of prior inflammatory process.  Acute colitis less likely.  Clinical correlation recommended.  No bowel obstruction.  Aortic atherosclerosis.  ED course: Initial vital signs were temperature 98.5 F, pulse 85 respiration 18, BP 139/82 mmHg O2 sat 97% on room air.  The patient received 1000 mL normal saline bolus, KCl 40 mill equivalents p.o., Ultram 50 mg p.o., magnesium oxide 400 mg p.o., ondansetron 4 mg ODT x 1 and KCl 10 mEq IVPB x 2.   Review of Systems: As mentioned in the history of present illness. All other systems reviewed and are negative.  Past Medical History:  Diagnosis Date   Alcohol dependence in sustained full remission    Benign essential hypertension 04/09/2021   Chronic pain syndrome 04/09/2021   Crohn's disease - self reported. no biopsy to prove it    Dilated cbd, acquired 04/09/2021   Elevated troponin I level 09/12/2022   Gallstone pancreatitis 04/09/2021   Generalized anxiety disorder    Gout 04/09/2021   History of heroin use    Kidney stones    Major depressive disorder    Obstructive sleep apnea    Opiate withdrawal (HCC) - possible 09/12/2022   Past Surgical History:  Procedure Laterality Date   LEEP     LITHOTRIPSY     TUBAL LIGATION     Social History:  reports that she has been smoking cigarettes. She has never used smokeless tobacco. She reports current alcohol use of about 4.0 - 5.0 standard drinks of alcohol per week. She reports that she does not currently use drugs.  Allergies  Allergen Reactions   Penicillins Hives    Family History  Problem Relation Age of  Onset   Thyroid disease Mother    Dementia Paternal Grandmother     Prior to Admission medications   Medication Sig Start Date End Date Taking? Authorizing Provider  albuterol (VENTOLIN HFA) 108 (90 Base) MCG/ACT inhaler Inhale 2 puffs into the lungs 2 (two) times daily. 07/26/23     amLODipine (NORVASC) 10 MG tablet Take 1 tablet (10 mg total) by mouth  daily. 05/30/23   Noralee Stain, DO  buprenorphine (SUBUTEX) 8 MG SUBL SL tablet DISSOLVE 1/2 TABLET UNDER THE TOUNGE TWICE A DAY 07/05/23     loperamide (IMODIUM) 2 MG capsule Take 2 capsules (4 mg total) by mouth every 6 (six) hours as needed for diarrhea or loose stools. 07/13/23   Carollee Herter, DO  pravastatin (PRAVACHOL) 20 MG tablet Take 1 tablet (20 mg total) by mouth daily. 03/13/23     traMADol (ULTRAM) 50 MG tablet Take 1-2 tablets (50-100 mg total) by mouth every 8 (eight) hours as needed. 07/05/23     traMADol (ULTRAM) 50 MG tablet Take 1-2 tablets (50-100 mg total) by mouth every 8 (eight) hours as needed. 08/04/23       Physical Exam: Vitals:   08/02/23 0645 08/02/23 1008 08/02/23 1009 08/02/23 1158  BP: (!) 173/105 (!) 140/97 (!) 140/97 (!) 161/112  Pulse: 81  91 86  Resp: 18  19 17   Temp: 99.4 F (37.4 C)  (!) 100.9 F (38.3 C) 99.4 F (37.4 C)  TempSrc: Oral  Oral Oral  SpO2: 99%  100% 96%  Weight:      Height:       Physical Exam Vitals and nursing note reviewed.  Constitutional:      General: She is awake. She is not in acute distress.    Appearance: Normal appearance. She is underweight. She is ill-appearing.  HENT:     Head: Normocephalic.     Nose: No rhinorrhea.     Mouth/Throat:     Mouth: Mucous membranes are moist.  Eyes:     General: Scleral icterus present.     Pupils: Pupils are equal, round, and reactive to light.  Neck:     Vascular: No JVD.  Cardiovascular:     Rate and Rhythm: Normal rate and regular rhythm.     Heart sounds: S1 normal and S2 normal.  Pulmonary:     Effort: Pulmonary effort is normal.     Breath sounds: Normal breath sounds.  Abdominal:     General: Bowel sounds are normal. There is no distension.     Palpations: Abdomen is soft.     Tenderness: There is no abdominal tenderness.  Musculoskeletal:     Cervical back: Neck supple.     Right lower leg: No edema.     Left lower leg: No edema.     Comments: Left middle,  right index and thumb fingers distal MCP nodules.  Please see picture below.  Skin:    General: Skin is warm and dry.  Neurological:     General: No focal deficit present.     Mental Status: She is alert and oriented to person, place, and time.  Psychiatric:        Mood and Affect: Mood normal.        Behavior: Behavior normal. Behavior is cooperative.     Data Reviewed:  Results are pending, will review when available.  Assessment and Plan: Principal Problem:   Hypokalemia Associated with:   Hypomagnesemia   Hypocalcemia  And   Hypophosphatemia  Admit to telemetry/observation Continue time-limited IV fluids. Oral potassium replacement. Magnesium sulfate 2 g IVPB given. Magnesium oxide 400 mg p.o. given in ED. Phosphorus being replaced. Follow-up electrolytes in AM. Alcohol cessation advised. Recheck calcium level in AM. High suspicion for vitamin D deficiency.  Active Problems:   Nodule of finger of both hands Briefly discussed with ID on-call. Will obtain blood cultures for now. The patient denied IVDA in the last 20 years. However, she also has a history of:   Gout Will check uric acid in the morning.    Alcohol use Stated she does not drink daily. Stated only has a drink or 2 when she does. Denied anxiety or tremors on nondrinking days. Will begin CIWA protocol with lorazepam as needed. MVI, folate and thiamine ordered.    Chronic pain syndrome Continue buprenorphine 4 mg p.o. twice daily. Continue tramadol 50 mg p.o. every 6 hours PRN. Follow-up with pain management as scheduled.    Benign essential hypertension Continue amlodipine 10 mg p.o. daily.    Obstructive sleep apnea Not on CPAP.    Major depressive disorder No SI or HI. Follow-up with PCP as an outpatient.    Malnutrition of moderate degree In the setting of EtOH use.    Tobacco use Tobacco cessation advised. Nicotine replacement therapy declined.    Abnormal LFTs (liver function  tests) Hold pravastatin. Alcohol cessation.    Macrocytic anemia In the setting of alcohol abuse. Alcohol cessation advised.     Advance Care Planning:   Code Status: Full Code   Consults:   Family Communication:   Severity of Illness: The appropriate patient status for this patient is OBSERVATION. Observation status is judged to be reasonable and necessary in order to provide the required intensity of service to ensure the patient's safety. The patient's presenting symptoms, physical exam findings, and initial radiographic and laboratory data in the context of their medical condition is felt to place them at decreased risk for further clinical deterioration. Furthermore, it is anticipated that the patient will be medically stable for discharge from the hospital within 2 midnights of admission.   Author: Bobette Mo, MD 08/02/2023 12:54 PM  For on call review [email or web address].   This document was prepared using Dragon voice recognition software and may contain some unintended transcription errors.

## 2023-08-03 DIAGNOSIS — Z681 Body mass index (BMI) 19 or less, adult: Secondary | ICD-10-CM | POA: Diagnosis not present

## 2023-08-03 DIAGNOSIS — M109 Gout, unspecified: Secondary | ICD-10-CM | POA: Diagnosis present

## 2023-08-03 DIAGNOSIS — Z789 Other specified health status: Secondary | ICD-10-CM

## 2023-08-03 DIAGNOSIS — I10 Essential (primary) hypertension: Secondary | ICD-10-CM | POA: Diagnosis present

## 2023-08-03 DIAGNOSIS — Z8349 Family history of other endocrine, nutritional and metabolic diseases: Secondary | ICD-10-CM | POA: Diagnosis not present

## 2023-08-03 DIAGNOSIS — L03012 Cellulitis of left finger: Secondary | ICD-10-CM | POA: Diagnosis present

## 2023-08-03 DIAGNOSIS — Z79899 Other long term (current) drug therapy: Secondary | ICD-10-CM | POA: Diagnosis not present

## 2023-08-03 DIAGNOSIS — F1021 Alcohol dependence, in remission: Secondary | ICD-10-CM | POA: Diagnosis present

## 2023-08-03 DIAGNOSIS — G894 Chronic pain syndrome: Secondary | ICD-10-CM | POA: Diagnosis present

## 2023-08-03 DIAGNOSIS — F1111 Opioid abuse, in remission: Secondary | ICD-10-CM | POA: Diagnosis present

## 2023-08-03 DIAGNOSIS — Z820 Family history of epilepsy and other diseases of the nervous system: Secondary | ICD-10-CM | POA: Diagnosis not present

## 2023-08-03 DIAGNOSIS — N200 Calculus of kidney: Secondary | ICD-10-CM | POA: Diagnosis present

## 2023-08-03 DIAGNOSIS — F17210 Nicotine dependence, cigarettes, uncomplicated: Secondary | ICD-10-CM | POA: Diagnosis present

## 2023-08-03 DIAGNOSIS — Z1152 Encounter for screening for COVID-19: Secondary | ICD-10-CM | POA: Diagnosis not present

## 2023-08-03 DIAGNOSIS — G4733 Obstructive sleep apnea (adult) (pediatric): Secondary | ICD-10-CM | POA: Diagnosis present

## 2023-08-03 DIAGNOSIS — Z880 Allergy status to penicillin: Secondary | ICD-10-CM | POA: Diagnosis not present

## 2023-08-03 DIAGNOSIS — E876 Hypokalemia: Secondary | ICD-10-CM | POA: Diagnosis present

## 2023-08-03 DIAGNOSIS — E43 Unspecified severe protein-calorie malnutrition: Secondary | ICD-10-CM | POA: Diagnosis present

## 2023-08-03 DIAGNOSIS — D539 Nutritional anemia, unspecified: Secondary | ICD-10-CM | POA: Diagnosis present

## 2023-08-03 DIAGNOSIS — Z5941 Food insecurity: Secondary | ICD-10-CM | POA: Diagnosis not present

## 2023-08-03 DIAGNOSIS — K5090 Crohn's disease, unspecified, without complications: Secondary | ICD-10-CM | POA: Diagnosis present

## 2023-08-03 DIAGNOSIS — F329 Major depressive disorder, single episode, unspecified: Secondary | ICD-10-CM | POA: Diagnosis present

## 2023-08-03 LAB — COMPREHENSIVE METABOLIC PANEL
ALT: 32 U/L (ref 0–44)
AST: 110 U/L — ABNORMAL HIGH (ref 15–41)
Albumin: 2.7 g/dL — ABNORMAL LOW (ref 3.5–5.0)
Alkaline Phosphatase: 149 U/L — ABNORMAL HIGH (ref 38–126)
Anion gap: 9 (ref 5–15)
BUN: 9 mg/dL (ref 6–20)
CO2: 26 mmol/L (ref 22–32)
Calcium: 7 mg/dL — ABNORMAL LOW (ref 8.9–10.3)
Chloride: 102 mmol/L (ref 98–111)
Creatinine, Ser: 0.71 mg/dL (ref 0.44–1.00)
GFR, Estimated: >60 mL/min (ref >60)
Glucose, Bld: 82 mg/dL (ref 70–99)
Potassium: 3.7 mmol/L (ref 3.5–5.1)
Sodium: 137 mmol/L (ref 135–145)
Total Bilirubin: 1.4 mg/dL — ABNORMAL HIGH (ref <1.2)
Total Protein: 5.7 g/dL — ABNORMAL LOW (ref 6.5–8.1)

## 2023-08-03 LAB — MAGNESIUM: Magnesium: 1.6 mg/dL — ABNORMAL LOW (ref 1.7–2.4)

## 2023-08-03 LAB — SEDIMENTATION RATE: Sed Rate: 33 mm/h — ABNORMAL HIGH (ref 0–22)

## 2023-08-03 LAB — VITAMIN D 25 HYDROXY (VIT D DEFICIENCY, FRACTURES): Vit D, 25-Hydroxy: 53.81 ng/mL (ref 30–100)

## 2023-08-03 LAB — CBC
HCT: 30.4 % — ABNORMAL LOW (ref 36.0–46.0)
Hemoglobin: 10 g/dL — ABNORMAL LOW (ref 12.0–15.0)
MCH: 35.5 pg — ABNORMAL HIGH (ref 26.0–34.0)
MCHC: 32.9 g/dL (ref 30.0–36.0)
MCV: 107.8 fL — ABNORMAL HIGH (ref 80.0–100.0)
Platelets: 183 10*3/uL (ref 150–400)
RBC: 2.82 MIL/uL — ABNORMAL LOW (ref 3.87–5.11)
RDW: 15.6 % — ABNORMAL HIGH (ref 11.5–15.5)
WBC: 7.1 10*3/uL (ref 4.0–10.5)
nRBC: 0 % (ref 0.0–0.2)

## 2023-08-03 LAB — PHOSPHORUS: Phosphorus: 2.3 mg/dL — ABNORMAL LOW (ref 2.5–4.6)

## 2023-08-03 LAB — URIC ACID
Uric Acid, Serum: 6.6 mg/dL (ref 2.5–7.1)
Uric Acid, Serum: 5.7 mg/dL (ref 2.5–7.1)

## 2023-08-03 LAB — C-REACTIVE PROTEIN: CRP: 15 mg/dL — ABNORMAL HIGH (ref <1.0)

## 2023-08-03 MED ORDER — ZINC SULFATE 220 (50 ZN) MG PO CAPS
220.0000 mg | ORAL_CAPSULE | Freq: Every day | ORAL | Status: DC
  Administered 2023-08-03 – 2023-08-05 (×3): 220 mg via ORAL
  Filled 2023-08-03 (×3): qty 1

## 2023-08-03 MED ORDER — SODIUM CHLORIDE 0.9 % IV SOLN
1.0000 g | INTRAVENOUS | Status: DC
Start: 2023-08-03 — End: 2023-08-05
  Administered 2023-08-03 – 2023-08-04 (×2): 1 g via INTRAVENOUS
  Filled 2023-08-03 (×2): qty 10

## 2023-08-03 MED ORDER — POTASSIUM CHLORIDE CRYS ER 20 MEQ PO TBCR: 40.0000 meq | EXTENDED_RELEASE_TABLET | Freq: Every day | ORAL | Status: DC

## 2023-08-03 MED ORDER — ENSURE ENLIVE PO LIQD
237.0000 mL | Freq: Four times a day (QID) | ORAL | Status: DC
  Administered 2023-08-03 (×2): 237 mL via ORAL

## 2023-08-03 MED ORDER — VITAMIN C 500 MG PO TABS
500.0000 mg | ORAL_TABLET | Freq: Every day | ORAL | Status: DC
  Administered 2023-08-03 – 2023-08-05 (×3): 500 mg via ORAL
  Filled 2023-08-03 (×3): qty 1

## 2023-08-03 NOTE — Evaluation (Signed)
Occupational Therapy Evaluation Patient Details Name: [name] MRN: [id number] DOB: [birth year] Today's Date: 08/03/2023   History of Present Illness [name] is a 52 y.o. female with PMH of chronic pain , heroine use, on suboxone HTN, Crohn's dis., gout,  OSA,depression, hypokalemia, presents 08/01/23 to the ED with worsening right hip pain.  Patient states that she has had worsening pain in her right hip with difficulty ambulating.   Clinical Impression   Pt typically walks with a cane inside and a rollator outside. She lives with her fiance. Pt reports difficulty preparing meals and housekeeping as she has low endurance. Pt presents with generalized weakness, decreased activity tolerance and impaired standing balance requiring RW and CGA for ambulation. Pt needs set up to supervision for ADLs. Do not anticipate pt will requires post acute OT as medical issues resolve. Will follow acutely.       If plan is discharge home, recommend the following: Assist for transportation;Assistance with cooking/housework    Functional Status Assessment  Patient has had a recent decline in their functional status and demonstrates the ability to make significant improvements in function in a reasonable and predictable amount of time.  Equipment Recommendations  None recommended by OT    Recommendations for Other Services       Precautions / Restrictions Precautions Precautions: Fall Restrictions Weight Bearing Restrictions Per Provider Order: No      Mobility Bed Mobility Overal bed mobility: Modified Independent             General bed mobility comments: HOB up    Transfers Overall transfer level: Needs assistance Equipment used: Rolling walker (2 wheels) Transfers: Sit to/from Stand Sit to Stand: Supervision                  Balance Overall balance assessment: Needs assistance Sitting-balance support: No upper extremity supported, Feet supported Sitting  balance-Leahy Scale: Good Sitting balance - Comments: no LOB with LB bathing and dressing     Standing balance-Leahy Scale: Poor Standing balance comment: poor dynamic, fair static                           ADL either performed or assessed with clinical judgement   ADL Overall ADL's : Needs assistance/impaired Eating/Feeding: Independent   Grooming: Wash/dry hands;Wash/dry face;Brushing hair;Sitting;Set up;Supervision/safety Grooming Details (indicate cue type and reason): supervision when inverting her head to style her hair Upper Body Bathing: Set up;Sitting   Lower Body Bathing: Supervison/ safety;Sit to/from stand   Upper Body Dressing : Set up;Sitting   Lower Body Dressing: Supervision/safety;Sit to/from stand   Toilet Transfer: Supervision/safety;Ambulation;Rolling walker (2 wheels)   Toileting- Clothing Manipulation and Hygiene: Supervision/safety;Sit to/from stand       Functional mobility during ADLs: Contact guard assist;Rolling walker (2 wheels)       Vision Baseline Vision/History: 0 No visual deficits       Perception         Praxis         Pertinent Vitals/Pain Pain Assessment Pain Assessment: Faces Faces Pain Scale: Hurts a little bit Pain Location: buttocks Pain Descriptors / Indicators: Discomfort Pain Intervention(s): Other (comment) (washed)     Extremity/Trunk Assessment Upper Extremity Assessment Upper Extremity Assessment: Overall WFL for tasks assessed           Communication     Cognition Arousal: Alert Behavior During Therapy: WFL for tasks assessed/performed, Anxious, Restless Overall Cognitive Status: Within Functional Limits  for tasks assessed                                       General Comments       Exercises     Shoulder Instructions      Home Living Family/patient expects to be discharged to:: Private residence Living Arrangements: Spouse/significant other Available Help at  Discharge: Friend(s);Available 24 hours/day Type of Home: House Home Access: Stairs to enter [employer] of Steps: 4-5 Entrance Stairs-Rails: Right;Left Home Layout: One level     Bathroom Shower/Tub: Chief Strategy Officer: Standard     Home Equipment: Rollator (4 wheels);Shower seat;Cane - single point          Prior Functioning/Environment Prior Level of Function : Independent/Modified Independent             Mobility Comments: SPC in the home, rollator outside of the home ADLs Comments: mod I, states relies on outside transportation, struggles with meal prep and housekeeping--works together with her boyfriend        OT Problem List: Decreased activity tolerance;Impaired balance (sitting and/or standing)      OT Treatment/Interventions: Self-care/ADL training;DME and/or AE instruction;Therapeutic activities;Patient/family education;Balance training    OT Goals(Current goals can be found in the care plan section) Acute Rehab OT Goals OT Goal Formulation: With patient Time For Goal Achievement: 08/17/23 Potential to Achieve Goals: Good  OT Frequency: Min 1X/week    Co-evaluation              AM-PAC OT "6 Clicks" Daily Activity     Outcome Measure Help from another person eating meals?: None Help from another person taking care of personal grooming?: A Little Help from another person toileting, which includes using toliet, bedpan, or urinal?: A Little Help from another person bathing (including washing, rinsing, drying)?: A Little Help from another person to put on and taking off regular upper body clothing?: None Help from another person to put on and taking off regular lower body clothing?: A Little 6 Click Score: 20   End of Session Equipment Utilized During Treatment: Rolling walker (2 wheels)  Activity Tolerance: Patient tolerated treatment well Patient left: in bed;with call bell/phone within reach  OT Visit Diagnosis:  Unsteadiness on feet (R26.81);Other abnormalities of gait and mobility (R26.89);Muscle weakness (generalized) (M62.81)                Time: 1050-1110 OT Time Calculation (min): 20 min Charges:  OT General Charges $OT Visit: 1 Visit OT Evaluation $OT Eval Low Complexity: 1 Low  Berna Spare, OTR/L Acute Rehabilitation Services Office: [phone]   Evern Bio 08/03/2023, 11:17 AM

## 2023-08-03 NOTE — Discharge Instructions (Signed)

## 2023-08-03 NOTE — Progress Notes (Signed)
Physical Therapy Treatment Patient Details Name: [name] MRN: [id number] DOB: [birth year] Today's Date: 08/03/2023   History of Present Illness [name] is a 52 y.o. female with PMH of chronic pain , heroine use, on suboxone HTN, Crohn's dis., gout,  OSA,depression, hypokalemia, presents 08/01/23 to the ED with worsening right hip pain.  Patient states that she has had worsening pain in her right hip with difficulty ambulating.    PT Comments  The patient was seated on bed, had ambulated in room with  RN using her cane. Patient cheerful and positive about the fact that she ambulated.  Patient then ambulated x 120' with RW and  supervision. Patient  has a rollator , also.  Continue PT for progressive mobility and endurance and ambulate as much as possible.    If plan is discharge home, recommend the following: A little help with walking and/or transfers;Help with stairs or ramp for entrance;Assistance with cooking/housework;Assist for transportation;A little help with bathing/dressing/bathroom   Can travel by private vehicle        Equipment Recommendations  None recommended by PT    Recommendations for Other Services       Precautions / Restrictions Precautions Precautions: Fall Restrictions Weight Bearing Restrictions Per Provider Order: No     Mobility  Bed Mobility Overal bed mobility: Modified Independent                  Transfers   Equipment used: Rolling walker (2 wheels) Transfers: Sit to/from Stand Sit to Stand: Supervision           General transfer comment: patient required no support    Ambulation/Gait Ambulation/Gait assistance: Supervision Gait Distance (Feet): 80 Feet Assistive device: Rolling walker (2 wheels) Gait Pattern/deviations: Step-through pattern, Trunk flexed Gait velocity: decr Gait velocity interpretation: <1.31 ft/sec, indicative of household ambulator   General Gait Details: gait steady with RW use, has been  using [facility]   Stairs             Wheelchair Mobility     Tilt Bed    Modified Rankin (Stroke Patients Only)       Balance   Sitting-balance support: No upper extremity supported, Feet supported Sitting balance-Leahy Scale: Good     Standing balance support: No upper extremity supported, During functional activity Standing balance-Leahy Scale: Poor Standing balance comment: poor dynamic, fair static                            Cognition Arousal: Alert Behavior During Therapy: WFL for tasks assessed/performed, Anxious, Restless Overall Cognitive Status: Within Functional Limits for tasks assessed                                          Exercises      General Comments        Pertinent Vitals/Pain Pain Assessment Faces Pain Scale: Hurts a little bit Pain Location: back Pain Descriptors / Indicators: Discomfort, Aching Pain Intervention(s): Monitored during session    Home Living Family/patient expects to be discharged to:: Private residence Living Arrangements: Spouse/significant other Available Help at Discharge: Friend(s);Available 24 hours/day Type of Home: House Home Access: Stairs to enter Entrance Stairs-Rails: Doctor, general practice of Steps: 4-5   Home Layout: One level Home Equipment: Rollator (4 wheels);Shower seat;Cane - single point      Prior  Function            PT Goals (current goals can now be found in the care plan section) Progress towards PT goals: Progressing toward goals    Frequency    Min 1X/week      PT Plan      Co-evaluation              AM-PAC PT "6 Clicks" Mobility   Outcome Measure  Help needed turning from your back to your side while in a flat bed without using bedrails?: None Help needed moving from lying on your back to sitting on the side of a flat bed without using bedrails?: None Help needed moving to and from a bed to a chair (including a wheelchair)?:  A Little Help needed standing up from a chair using your arms (e.g., wheelchair or bedside chair)?: A Little Help needed to walk in hospital room?: A Little Help needed climbing 3-5 steps with a railing? : A Lot 6 Click Score: 19    End of Session Equipment Utilized During Treatment: Gait belt Activity Tolerance: Patient tolerated treatment well Patient left:  (with OT) Nurse Communication: Mobility status PT Visit Diagnosis: Unsteadiness on feet (R26.81);Pain;Difficulty in walking, not elsewhere classified (R26.2)     Time: 7829-5621 PT Time Calculation (min) (ACUTE ONLY): 14 min  Charges:    $Gait Training: 8-22 mins PT General Charges $$ ACUTE PT VISIT: 1 Visit                     Blanchard Kelch PT Acute Rehabilitation Services Office [phone] Weekend pager-[phone]    Rada Hay 08/03/2023, 1:50 PM

## 2023-08-03 NOTE — Progress Notes (Signed)
Initial Nutrition Assessment  DOCUMENTATION CODES:   Severe malnutrition in context of social or environmental circumstances, Underweight  INTERVENTION:   Monitor magnesium, potassium, and phosphorus for at least 3 days, MD to replete as needed, as pt is at risk for refeeding syndrome.  -Ensure Plus High Protein po QID, each supplement provides 350 kcal and 20 grams of protein.   -Continue vitamin supplementation: MVI, Thiamine, Folic acid, Vitamin C daily, Zinc sulfate x 14 days  -Placed "High Calorie, High Protein" handout in AVS  NUTRITION DIAGNOSIS:   Severe Malnutrition related to social / environmental circumstances (food insecurity) as evidenced by moderate fat depletion, severe muscle depletion, percent weight loss, energy intake < or equal to 50% for > or equal to 1 month.  GOAL:   Patient will meet greater than or equal to 90% of their needs  MONITOR:   PO intake, Supplement acceptance, Labs, Weight trends, I & O's, Skin  REASON FOR ASSESSMENT:   Consult Assessment of nutrition requirement/status  ASSESSMENT:   52 y.o. female with past medical history of hypertension, chronic pain syndrome, Crohn's disease, gallstone pancreatitis, generalized anxiety disorder, gout, major depressive disorder, obstructive sleep apnea presented to hospital with nausea and vomiting with diarrhea for several weeks with some abdominal discomfort.  Patient in room, states she has been having a lot of  diarrhea today. Now being tested for c.diff.  Pt reports poor PO, going some days without eating at all. Pt has mobility issues, can't stand for long periods of time d/t bone disease. At times cannot get to the grocery store for food. Relies on microwave meals and uses EBT.  Pt would really like Ensure so will order for her. Recommended pt take daily MVI as able. Currently on CIWA supplements given history of alcohol use. Pt has been having N/V for weeks which puts her at higher risk of  micronutrient deficiencies. Pt does report some hair loss and vision changes lately.   Per weight records, pt has lost 23 lbs since 1/29 (19% wt loss x 10.5 months, significant for time frame).   Medications: Folic acid, Multivitamin with minerals daily, K-Phos, Thiamine, Zofran, Compazine  Labs reviewed: Low Phos Low Mg   NUTRITION - FOCUSED PHYSICAL EXAM:  Flowsheet Row Most Recent Value  Orbital Region Moderate depletion  Upper Arm Region Moderate depletion  Thoracic and Lumbar Region Moderate depletion  Buccal Region Moderate depletion  Temple Region Moderate depletion  Clavicle Bone Region Severe depletion  Clavicle and Acromion Bone Region Severe depletion  Scapular Bone Region Severe depletion  Dorsal Hand Severe depletion  Patellar Region Severe depletion  Anterior Thigh Region Severe depletion  Posterior Calf Region Severe depletion  Edema (RD Assessment) None  Hair Reviewed  Eyes Reviewed  Mouth Unable to assess  [kept mask on]  Skin Reviewed  [boil/blister of middle digits on both hands]  Nails Reviewed  [long]       Diet Order:   Diet Order             Diet regular Room service appropriate? Yes; Fluid consistency: Thin  Diet effective now                   EDUCATION NEEDS:   Education needs have been addressed  Skin:  Skin Assessment: Skin Integrity Issues: Skin Integrity Issues:: Other (Comment) Other: bilateral boil/blisters on middle digits  Last BM:  12/12 -type 7  Height:   Ht Readings from Last 1 Encounters:  08/01/23 5\' 6"  (1.676  m)    Weight:   Wt Readings from Last 1 Encounters:  08/02/23 44.6 kg    BMI:  Body mass index is 15.87 kg/m.  Estimated Nutritional Needs:   Kcal:  1750-1950  Protein:  85-100g  Fluid:  2L/day   Tilda Franco, MS, RD, LDN Inpatient Clinical Dietitian Contact via Secure chat

## 2023-08-03 NOTE — Progress Notes (Addendum)
PROGRESS NOTE    [name]  QMV:[id number] DOB: [birth year] DOA: 08/01/2023 [facility]CP: [facility]    Brief Narrative:  [name] is a 52 y.o. female with past medical history of hypertension, chronic pain syndrome, Crohn's disease, gallstone pancreatitis, generalized anxiety disorder, gout, major depressive disorder, obstructive sleep apnea presented to hospital with nausea and vomiting with diarrhea for several weeks with some abdominal discomfort.  She did have colonoscopy 2004 which was normal.  Also admitted to 20 pound weight loss and ongoing nausea.  Family history of Crohn's disease in his father.  Patient did have a CT scan done last month which showed colitis of the right colon and transverse colon.  In the ED patient appeared to be chronically ill with diffuse abdominal tenderness.  Labs showed no leukocytosis.  BMP showed significant hypokalemia with potassium of 2.7.  Chest x-ray with no active disease.  CT renal study with nonobstructive bilateral renal calculi without hydronephrosis.  No colitis noted in this scan.  Hip with pelvic x-ray showed no evidence of fracture or dislocation.  GI was consulted from the ED and patient was admitted to hospital for further evaluation and treatment.  Assessment and plan.  Nausea vomiting and nonbloody diarrhea.  CT scan last month with proximal colitis.  GI on board and plan for colonoscopy as outpatient..  GI has signed off at this time.  Has had 4 episodes of diarrhea.  Will give C. difficile and GI pathogen panel.  Continue supportive care with antiemetics.  Received IV fluids.  Has been started on oral diet.  Hypokalemia Improved after repletion.  Potassium of 3.7.  Replace as necessary.    Hypomagnesemia   Hypophosphatemia Has been replenished.  Will repeat levels today.  Replenish as necessary.  Continue K-Phos Neutral.    Nodule of finger of both hands Briefly discussed with ID on-call by admitting provider.  Follow  blood cultures.  Denied IV drug use in the last 20 years.  Appears to have erythema tenderness and redness.  Will start on empiric antibiotic with Rocephin since penicillin allergy.  And reassess in AM.  Might need orthopedics evaluation if not improved.  Will add CRP, ESR.    Alcohol use disorder. Denies daily drinking.  Continue multivitamin folic acid thiamine.  On CIWA protocol with as needed Ativan.     Chronic pain syndrome On Subutex, Robaxin, tramadol.    Benign essential hypertension Continue amlodipine.    Obstructive sleep apnea Not on CPAP.     Major depressive disorder Follow-up with PCP as outpatient.   Nutrition Status: Nutrition Problem: Severe Malnutrition Etiology: social / environmental circumstances (food insecurity) Signs/Symptoms: moderate fat depletion, severe muscle depletion, percent weight loss, energy intake < or equal to 50% for > or equal to 1 month Interventions: Ensure Enlive (each supplement provides 350kcal and 20 grams of protein), MVI  Body mass index is 15.87 kg/m.  In the setting of EtOH use     Tobacco use Patient denied a nicotine patch.  Encouraged to quit.     Abnormal LFTs (liver function tests) Likely secondary to alcohol abuse.  Hold statins.     Macrocytic anemia Continue thiamine folic acid.  History of alcohol abuse.  Denies excessive drinking nowadays.  Opiate use disorder.  On buprenorphine.  Continue.  Debility deconditioning.  PT OT evaluation recommended skilled nursing facility placement.  Likely will go home with home health on discharge.   DVT prophylaxis: SCDs Start: 08/02/23 1258   Code  Status:     Code Status: Full Code  Disposition: Home likely in 1 to 2 days  Status is: Observation  The patient will require care spanning > 2 midnights and should be moved to inpatient because: Need for antibiotic, electrolyte imbalance, closer monitoring, need for skilled nursing   Family Communication: None at  bedside  Consultants:  GI Verbal consult with ID.  Procedures:  None  Antimicrobials:  Rocephin IV  Anti-infectives (From admission, onward)    None        Subjective: Today, patient was seen and examined at bedside.  Patient complains of pain on the left index finger with some redness and swelling.  Has had 3-4 episodes of diarrhea today.  Denies any pain tremors sweating hallucinations.  Objective: Vitals:   08/02/23 1840 08/02/23 2211 08/02/23 2311 08/03/23 0207  BP: 124/87 (!) 129/91  117/83  Pulse: 77 92  75  Resp: 15 16  14   Temp: 98.2 F (36.8 C) (!) 100.4 F (38 C) (!) 102.1 F (38.9 C) 98.8 F (37.1 C)  TempSrc: Oral Oral Oral Oral  SpO2: 100% 96%  100%  Weight:      Height:        Intake/Output Summary (Last 24 hours) at 08/03/2023 1127 Last data filed at 08/03/2023 0900 Gross per 24 hour  Intake 1242.73 ml  Output --  Net 1242.73 ml   Filed Weights   08/01/23 1855 08/02/23 1358  Weight: 44.5 kg 44.6 kg    Physical Examination:  Body mass index is 15.87 kg/m.   General: Thinly built, not in obvious distress, muscular stick noted. HENT:   No scleral pallor or icterus noted. Oral mucosa is moist.  Chest:  Clear breath sounds.  . No crackles or wheezes.  CVS: S1 &S2 heard. No murmur.  Regular rate and rhythm. Abdomen: Soft, nontender, nondistended.  Bowel sounds are heard.   Extremities: No cyanosis, clubbing or edema.  Peripheral pulses are palpable.  Upper extremities with some nodularity in erythema swelling with central area of whitish spot on the left finger. Psych: Alert, awake and oriented, normal mood CNS:  No cranial nerve deficits.  Power equal in all extremities.   Skin: Warm and dry.  No rashes noted.  Data Reviewed:   CBC: Recent Labs  Lab 08/02/23 1043 08/03/23 0632  WBC 7.2 7.1  HGB 10.1* 10.0*  HCT 28.1* 30.4*  MCV 102.2* 107.8*  PLT 191 183    Basic Metabolic Panel: Recent Labs  Lab 08/02/23 1043  08/02/23 1417 08/02/23 1748 08/03/23 0632 08/03/23 0827  NA 136  --  135 137  --   K 2.7*  --  2.9* 3.7  --   CL 94*  --  98 102  --   CO2 31  --  26 26  --   GLUCOSE 137*  --  157* 82  --   BUN 10  --  9 9  --   CREATININE 0.71  --  0.75 0.71  --   CALCIUM 7.3*  --  6.8* 7.0*  --   MG  --  1.1*  --   --  1.6*  PHOS  --  2.2*  --   --  2.3*    Liver Function Tests: Recent Labs  Lab 08/02/23 1043 08/03/23 0632  AST 109* 110*  ALT 23 32  ALKPHOS 133* 149*  BILITOT 1.7* 1.4*  PROT 6.2* 5.7*  ALBUMIN 2.9* 2.7*     Radiology Studies: DG Chest  Portable 1 View Result Date: 08/02/2023 CLINICAL DATA:  Fever, shortness of breath EXAM: PORTABLE CHEST 1 VIEW COMPARISON:  07/10/2023 FINDINGS: The heart size and mediastinal contours are within normal limits. Both lungs are clear. The visualized skeletal structures are unremarkable. IMPRESSION: No active disease. Electronically Signed   By: Duanne Guess D.O.   On: 08/02/2023 11:22   CT Renal Stone Study Result Date: 08/01/2023 CLINICAL DATA:  Abdominal pain.  Concern for kidney stone. EXAM: CT ABDOMEN AND PELVIS WITHOUT CONTRAST TECHNIQUE: Multidetector CT imaging of the abdomen and pelvis was performed following the standard protocol without IV contrast. RADIATION DOSE REDUCTION: This exam was performed according to the departmental dose-optimization program which includes automated exposure control, adjustment of the mA and/or kV according to patient size and/or use of iterative reconstruction technique. COMPARISON:  CT abdomen pelvis dated 07/10/2023. FINDINGS: Evaluation of this exam is limited in the absence of intravenous contrast. Lower chest: Bibasilar linear atelectasis/scarring. There is coronary vascular calcification. No intra-abdominal free air or free fluid. Hepatobiliary: Fatty liver. No biliary ductal dilatation. The gallbladder is distended. No calcified gallstone or pericholecystic fluid. Pancreas: Unremarkable. No  pancreatic ductal dilatation or surrounding inflammatory changes. Spleen: Normal in size without focal abnormality. Adrenals/Urinary Tract: The adrenal glands are unremarkable. 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 Several nonobstructing bilateral renal calculi measure up to 3 mm. There is no hydronephrosis on either side. Subcentimeter left renal lesions are too small to characterize. The visualized ureters and urinary bladder appear unremarkable. Stomach/Bowel: There is thickened appearance of the proximal half of the colon with submucosal fat deposit, likely sequela of prior inflammatory process. Acute colitis is less likely. Clinical correlation is recommended. There is no bowel obstruction. Appendectomy. Vascular/Lymphatic: Mild aortoiliac atherosclerotic disease. The IVC is unremarkable. No portal venous gas. There is no adenopathy. Reproductive: The uterus is grossly unremarkable. No adnexal masses. Other: None Musculoskeletal: No acute osseous pathology. IMPRESSION: 1. Nonobstructing bilateral renal calculi. No hydronephrosis. 2. Fatty liver. 3. Thickened appearance of the proximal half of the colon with submucosal fat deposit, likely sequela of prior inflammatory process. Acute colitis is less likely. Clinical correlation is recommended. No bowel obstruction. 4.  Aortic Atherosclerosis (ICD10-I70.0). Electronically Signed   By: Elgie Collard M.D.   On: 08/01/2023 21:10   DG Hip Unilat W or Wo Pelvis 2-3 Views Right Result Date: 08/01/2023 CLINICAL DATA:  Chronic right hip pain. EXAM: DG HIP (WITH OR WITHOUT PELVIS) 2-3V RIGHT COMPARISON:  April 24, 2009. FINDINGS: There is no evidence of hip fracture or dislocation. There is no evidence of arthropathy or other focal bone abnormality. IMPRESSION: Negative. Electronically Signed   By: Lupita Raider M.D.   On: 08/01/2023 19:51      LOS: 0 days    Joycelyn Das, MD [facility] Available via Epic secure chat  7am-7pm After these hours, please refer to coverage provider listed on [email or web address] 08/03/2023, 11:27 AM

## 2023-08-03 NOTE — TOC Initial Note (Addendum)
Transition of Care [facility]) - Initial/Assessment Note    Patient Details  Name: [name] MRN: [id number] Date of Birth: 1970-09-22  [facility]) CM/SW Contact:    Beckie Busing, RN Phone Number:[phone]  08/03/2023, 3:03 PM  Clinical Narrative:                 [facility] consulted for SNF placement. CM reviewing chart to find that there are currently no bed offers. CM at bedside with patient . Patient states that she is from home with her significant other. Patient states that she normally functions independently but feels that she needs a little help with cleaning and cooking. CM has explained that home health services would not be assisting with cooking and cleaning. Patient verbalized understanding. Patient states that home health PT would not work good for her right now because she has 2 large dogs that would interfere with therapy. Patient states that she is comfortable with going home and would not want to go to SNF. TOC will continue to follow.   Expected Discharge Plan: Home/Self Care Barriers to Discharge: Continued Medical Work up   Patient Goals and CMS Choice Patient states their goals for this hospitalization and ongoing recovery are:: Wants to get stronger to manage cooking and cleaning at home. CMS Medicare.gov Compare Post Acute Care list provided to:: Patient Choice offered to / list presented to : Patient [place] ownership interest in [facility].provided to:: Patient    Expected Discharge Plan and Services In-house Referral: NA Discharge Planning Services: CM Consult Post Acute Care Choice: Skilled Nursing Facility (No bed offers avaliable) Living arrangements for the past 2 months: Single Family Home                 DME Arranged: N/A DME Agency: NA       HH Arranged: Refused HH (Patient is refusing home health states that she has 2 big dogs and home health will not be manageable) HH Agency: NA (Patient currently refusing due to her  dogs being in the home)        Prior Living Arrangements/Services Living arrangements for the past 2 months: Single Family Home Lives with:: Significant Other Patient language and need for interpreter reviewed:: Yes Do you feel safe going back to the place where you live?: Yes      Need for Family Participation in Patient Care: No (Comment)   Current home services: DME (rollator, shower chair,) Criminal Activity/Legal Involvement Pertinent to Current Situation/Hospitalization: No - Comment as needed  Activities of Daily Living   ADL Screening (condition at time of admission) Independently performs ADLs?: No Does the patient have a NEW difficulty with bathing/dressing/toileting/self-feeding that is expected to last >3 days?: Yes (Initiates electronic notice to provider for possible OT consult) Does the patient have a NEW difficulty with getting in/out of bed, walking, or climbing stairs that is expected to last >3 days?: Yes (Initiates electronic notice to provider for possible PT consult) Does the patient have a NEW difficulty with communication that is expected to last >3 days?: Yes (Initiates electronic notice to provider for possible SLP consult) Is the patient deaf or have difficulty hearing?: No Does the patient have difficulty seeing, even when wearing glasses/contacts?: No Does the patient have difficulty concentrating, remembering, or making decisions?: No  Permission Sought/Granted Permission sought to share information with : Family Supports Permission granted to share information with : No  Emotional Assessment Appearance:: Appears stated age Attitude/Demeanor/Rapport: Gracious Affect (typically observed): Accepting, Pleasant Orientation: : Oriented to Self, Oriented to Place, Oriented to  Time, Oriented to Situation Alcohol / Substance Use: Not Applicable Psych Involvement: No (comment)  Admission diagnosis:  Hypokalemia [E87.6] Right leg pain  [M79.604] Right flank pain [R10.9] Patient Active Problem List   Diagnosis Date Noted   Hypocalcemia 08/03/2023   Hypokalemia 08/02/2023   Hypophosphatemia 08/02/2023   Abnormal LFTs (liver function tests) 08/02/2023   Macrocytic anemia 08/02/2023   Nodule of finger of both hands 08/02/2023   Tobacco use 05/24/2023   Acquired deformity of joint of finger of right hand 05/24/2023   Hypomagnesemia 01/21/2023   Malnutrition of moderate degree 09/16/2022   Alcohol use 09/12/2022   Major depressive disorder    Generalized anxiety disorder    History of heroin use    Crohn's disease - self reported. no biopsy proving Crohn's disease. small biopsy on 03-18-2003    Obstructive sleep apnea    Gout 04/09/2021   Dilated cbd, acquired 04/09/2021   Chronic pain syndrome 04/09/2021   Benign essential hypertension 04/09/2021   Alcohol dependence in sustained full remission    PCP:  [facility]:   [facility] [address], [address] [place] [address] Phone: [phone] Fax: [phone]     Social Drivers of Health (SDOH) Social History: SDOH Screenings   Food Insecurity: No Food Insecurity (08/02/2023)  Recent Concern: Food Insecurity - Food Insecurity Present (05/25/2023)  Housing: Low Risk  (08/02/2023)  Recent Concern: Housing - Medium Risk (05/25/2023)  Transportation Needs: No Transportation Needs (08/02/2023)  Recent Concern: Transportation Needs - Unmet Transportation Needs (05/25/2023)  Utilities: Not At Risk (08/02/2023)  Recent Concern: Utilities - At Risk (05/25/2023)  Alcohol Screen: Low Risk  (04/22/2021)  Depression (PHQ2-9): Low Risk  (01/04/2022)  Tobacco Use: High Risk (08/01/2023)   SDOH Interventions:     Readmission Risk Interventions     No data to display

## 2023-08-03 NOTE — Progress Notes (Signed)
Patient's sister Zella Ball called to find out how the patient is doing. I informed her of the work-up that is being done on her and updates included with this admission. She stated that she will be in to see her for a visit soon.

## 2023-08-03 NOTE — Evaluation (Signed)
SLP Cancellation Note  Patient Details Name: CHRYSANTHE DEVANE MRN: [id number] DOB: [birth year]   Cancelled treatment:       Reason Eval/Treat Not Completed: Other (comment) (SLP apologizes for delay in cog eval, will make best effort next date.)  Rolena Infante, MS [facility] SLP Acute Rehab Services Office [phone]  Chales Abrahams 08/03/2023, 5:35 PM

## 2023-08-03 NOTE — Plan of Care (Signed)

## 2023-08-04 ENCOUNTER — Inpatient Hospital Stay (HOSPITAL_COMMUNITY): Payer: MEDICAID

## 2023-08-04 DIAGNOSIS — E876 Hypokalemia: Secondary | ICD-10-CM | POA: Diagnosis not present

## 2023-08-04 DIAGNOSIS — I10 Essential (primary) hypertension: Secondary | ICD-10-CM | POA: Diagnosis not present

## 2023-08-04 DIAGNOSIS — Z789 Other specified health status: Secondary | ICD-10-CM | POA: Diagnosis not present

## 2023-08-04 LAB — BASIC METABOLIC PANEL
Anion gap: 9 (ref 5–15)
BUN: 11 mg/dL (ref 6–20)
CO2: 27 mmol/L (ref 22–32)
Calcium: 7.5 mg/dL — ABNORMAL LOW (ref 8.9–10.3)
Chloride: 100 mmol/L (ref 98–111)
Creatinine, Ser: 0.59 mg/dL (ref 0.44–1.00)
GFR, Estimated: >60 mL/min (ref >60)
Glucose, Bld: 84 mg/dL (ref 70–99)
Potassium: 2.9 mmol/L — ABNORMAL LOW (ref 3.5–5.1)
Sodium: 136 mmol/L (ref 135–145)

## 2023-08-04 LAB — CBC
HCT: 26 % — ABNORMAL LOW (ref 36.0–46.0)
Hemoglobin: 8.8 g/dL — ABNORMAL LOW (ref 12.0–15.0)
MCH: 35.8 pg — ABNORMAL HIGH (ref 26.0–34.0)
MCHC: 33.8 g/dL (ref 30.0–36.0)
MCV: 105.7 fL — ABNORMAL HIGH (ref 80.0–100.0)
Platelets: 210 10*3/uL (ref 150–400)
RBC: 2.46 MIL/uL — ABNORMAL LOW (ref 3.87–5.11)
RDW: 15.3 % (ref 11.5–15.5)
WBC: 6.7 10*3/uL (ref 4.0–10.5)
nRBC: 0 % (ref 0.0–0.2)

## 2023-08-04 LAB — MAGNESIUM: Magnesium: 1.4 mg/dL — ABNORMAL LOW (ref 1.7–2.4)

## 2023-08-04 MED ORDER — COLCHICINE 0.3 MG HALF TABLET
0.3000 mg | ORAL_TABLET | Freq: Two times a day (BID) | ORAL | Status: DC
  Administered 2023-08-04 – 2023-08-05 (×2): 0.3 mg via ORAL
  Filled 2023-08-04 (×2): qty 1

## 2023-08-04 MED ORDER — MAGNESIUM SULFATE 4 GM/100ML IV SOLN
4.0000 g | Freq: Once | INTRAVENOUS | Status: AC
  Administered 2023-08-04: 4 g via INTRAVENOUS
  Filled 2023-08-04: qty 100

## 2023-08-04 MED ORDER — INDOMETHACIN 25 MG PO CAPS
50.0000 mg | ORAL_CAPSULE | Freq: Three times a day (TID) | ORAL | Status: DC
  Administered 2023-08-04 – 2023-08-05 (×2): 50 mg via ORAL
  Filled 2023-08-04 (×4): qty 2

## 2023-08-04 MED ORDER — NICOTINE 21 MG/24HR TD PT24
21.0000 mg | MEDICATED_PATCH | Freq: Every day | TRANSDERMAL | Status: DC
  Administered 2023-08-04 – 2023-08-05 (×2): 21 mg via TRANSDERMAL
  Filled 2023-08-04 (×2): qty 1

## 2023-08-04 MED ORDER — MAGNESIUM OXIDE -MG SUPPLEMENT 400 (240 MG) MG PO TABS
400.0000 mg | ORAL_TABLET | Freq: Two times a day (BID) | ORAL | Status: DC
  Administered 2023-08-04 – 2023-08-05 (×3): 400 mg via ORAL
  Filled 2023-08-04 (×3): qty 1

## 2023-08-04 MED ORDER — POTASSIUM CHLORIDE 10 MEQ/100ML IV SOLN
10.0000 meq | INTRAVENOUS | Status: AC
  Administered 2023-08-04 (×2): 10 meq via INTRAVENOUS
  Filled 2023-08-04 (×3): qty 100

## 2023-08-04 MED ORDER — K PHOS MONO-SOD PHOS DI & MONO 155-852-130 MG PO TABS: 250.0000 mg | ORAL_TABLET | Freq: Two times a day (BID) | ORAL | Status: DC

## 2023-08-04 MED ORDER — POTASSIUM CHLORIDE CRYS ER 20 MEQ PO TBCR
40.0000 meq | EXTENDED_RELEASE_TABLET | Freq: Two times a day (BID) | ORAL | Status: AC
  Administered 2023-08-04 (×2): 40 meq via ORAL
  Filled 2023-08-04 (×2): qty 2

## 2023-08-04 MED ORDER — METHYLPREDNISOLONE SODIUM SUCC 40 MG IJ SOLR
40.0000 mg | Freq: Every day | INTRAMUSCULAR | Status: DC
  Administered 2023-08-04 – 2023-08-05 (×2): 40 mg via INTRAVENOUS
  Filled 2023-08-04 (×2): qty 1

## 2023-08-04 NOTE — Plan of Care (Signed)

## 2023-08-04 NOTE — Progress Notes (Signed)
Physical Therapy Treatment Patient Details Name: [name] MRN: [id number] DOB: [birth year] Today's Date: 08/04/2023   History of Present Illness [name] is a 52 y.o. female  presents 08/01/23 to the ED with worsening right hip pain.  Patient states that she has had worsening pain in her right hip with difficulty ambulating. Also with N/V/D and  left middle finger cellulitis.  PMH of chronic pain , heroin use, on suboxone HTN, Crohn's dis., gout, OSA,depression, hypokalemia    PT Comments  Pt reports 8/10 pain in "tail bone, back bone, and L middle finger". Pt ambulated 66' with RW without loss of balance, distance limited by pain. She is now mobilizing well enough to DC home.     If plan is discharge home, recommend the following: Help with stairs or ramp for entrance;Assistance with cooking/housework;Assist for transportation;A little help with bathing/dressing/bathroom   Can travel by private vehicle     Yes  Equipment Recommendations  None recommended by PT    Recommendations for Other Services       Precautions / Restrictions Precautions Precautions: Fall Restrictions Weight Bearing Restrictions Per Provider Order: No     Mobility  Bed Mobility Overal bed mobility: Modified Independent     Sidelying to sit: Modified independent (Device/Increase time), HOB elevated, Used rails       General bed mobility comments: HOB up    Transfers Overall transfer level: Needs assistance Equipment used: Rolling walker (2 wheels) Transfers: Sit to/from Stand Sit to Stand: Supervision           General transfer comment: VCs for safe hand placement, no physical assist needed    Ambulation/Gait Ambulation/Gait assistance: Supervision Gait Distance (Feet): 80 Feet Assistive device: Rolling walker (2 wheels) Gait Pattern/deviations: Step-through pattern, Trunk flexed Gait velocity: decr     General Gait Details: gait steady with RW use, has been using SPC at  baseline, not able to correct trunk flexion as this increases pain, distance limited by pain   Stairs             Wheelchair Mobility     Tilt Bed    Modified Rankin (Stroke Patients Only)       Balance   Sitting-balance support: No upper extremity supported, Feet supported Sitting balance-Leahy Scale: Good     Standing balance support: No upper extremity supported, During functional activity Standing balance-Leahy Scale: Poor Standing balance comment: poor dynamic, fair static                            Cognition Arousal: Alert Behavior During Therapy: WFL for tasks assessed/performed, Anxious, Restless Overall Cognitive Status: Within Functional Limits for tasks assessed                                          Exercises      General Comments        Pertinent Vitals/Pain Pain Assessment Pain Score: 8  Pain Location: "tail bone", "back bone", L middle finger Pain Descriptors / Indicators: Sore, Grimacing Pain Intervention(s): Limited activity within patient's tolerance, Monitored during session, Premedicated before session, Repositioned    Home Living                          Prior Function  PT Goals (current goals can now be found in the care plan section) Acute Rehab PT Goals Patient Stated Goal: to walk, no pain PT Goal Formulation: With patient Time For Goal Achievement: 08/16/23 Potential to Achieve Goals: Fair Progress towards PT goals: Progressing toward goals    Frequency    Min 1X/week      PT Plan      Co-evaluation              AM-PAC PT "6 Clicks" Mobility   Outcome Measure  Help needed turning from your back to your side while in a flat bed without using bedrails?: None Help needed moving from lying on your back to sitting on the side of a flat bed without using bedrails?: None Help needed moving to and from a bed to a chair (including a wheelchair)?: A Little Help  needed standing up from a chair using your arms (e.g., wheelchair or bedside chair)?: A Little Help needed to walk in hospital room?: A Little Help needed climbing 3-5 steps with a railing? : A Lot 6 Click Score: 19    End of Session Equipment Utilized During Treatment: Gait belt Activity Tolerance: Patient tolerated treatment well Patient left: in chair;with call bell/phone within reach (with OT) Nurse Communication: Mobility status PT Visit Diagnosis: Unsteadiness on feet (R26.81);Pain;Difficulty in walking, not elsewhere classified (R26.2) Pain - Right/Left: Left Pain - part of body: Hand     Time: 4098-1191 PT Time Calculation (min) (ACUTE ONLY): 13 min  Charges:    $Gait Training: 8-22 mins PT General Charges $$ ACUTE PT VISIT: 1 Visit                     Tamala Ser PT 08/04/2023  Acute Rehabilitation Services  Office [phone]

## 2023-08-04 NOTE — Progress Notes (Signed)
PROGRESS NOTE    [name]  ZDG:[id number] DOB: [birth year] DOA: 08/01/2023 PCP: [facility]    Brief Narrative:   [name] is a 52 y.o. female with past medical history of hypertension, chronic pain syndrome, Crohn's disease, gallstone pancreatitis, generalized anxiety disorder, gout, major depressive disorder, obstructive sleep apnea presented to hospital with nausea and vomiting with diarrhea for several weeks with some abdominal discomfort.  She did have colonoscopy 2004 which was normal.  Also admitted to 20 pound weight loss and ongoing nausea.  Family history of Crohn's disease in his father.  Patient did have a CT scan done last month which showed colitis of the right colon and transverse colon.  In the ED patient appeared to be chronically ill with diffuse abdominal tenderness.  Labs showed no leukocytosis.  BMP showed significant hypokalemia with potassium of 2.7.  Chest x-ray with no active disease.  CT renal study with nonobstructive bilateral renal calculi without hydronephrosis.  No colitis noted in this scan.  Hip with pelvic x-ray showed no evidence of fracture or dislocation.  GI was consulted from the ED and patient was admitted to hospital for further evaluation and treatment.  Assessment and plan.  Nausea, vomiting and nonbloody diarrhea.   CT scan last month with proximal colitis.  GI on board and plan for colonoscopy as outpatient..  GI has signed off at this time.  Had some loose stools.  Will give C. difficile and GI pathogen panel.  Continue supportive care with antiemetics.  Received IV fluids.  Has been started on oral diet.    Nodule of finger of both hands with left middle finger cellulitis. Briefly discussed with ID on-call by admitting provider.  Blood cultures negative so far in 2 days..  ESR CRP elevated..  Uric acid level within normal range.  Denied IV drug use in the last 20 years.  Clinical signs of erythema edema tenderness with central  area of possible pus collection.  Will get the hand surgery consultation.  Might need I&D.  Has been started on  Rocephin since penicillin allergy.    Hypokalemia Initially improved but again low at 2.9.  Will continue to aggressively replenish with 40 mEq of IV and 40 equivalents oral x 2.  Check BMP in AM.    Hypomagnesemia Magnesium of 1.4 today.  Will replenish through IV 4 g of magnesium today.  Add magnesium oxide check magnesium level in AM.    Hypophosphatemia Has been replenished.  Will repeat levels today.  Replenish as necessary.  Continue K-Phos Neutral.    Alcohol use disorder. Denies daily drinking.  Continue multivitamin folic acid thiamine.  On CIWA protocol with as needed Ativan.  No signs of withdrawal noted.     Chronic pain syndrome On Subutex, Robaxin, tramadol.    Benign essential hypertension Continue amlodipine.    Obstructive sleep apnea Not on CPAP.     Major depressive disorder Follow-up with PCP as outpatient.   Nutrition Status: Nutrition Problem: Severe Malnutrition Etiology: social / environmental circumstances (food insecurity) Signs/Symptoms: moderate fat depletion, severe muscle depletion, percent weight loss, energy intake < or equal to 50% for > or equal to 1 month Interventions: Ensure Enlive (each supplement provides 350kcal and 20 grams of protein), MVI  Body mass index is 15.87 kg/m.  In the setting of EtOH use.     Tobacco use Patient denied a nicotine patch.  Encouraged to quit.     Abnormal LFTs (liver function tests) Likely  secondary to alcohol abuse.  Hold statins.     Macrocytic anemia Continue thiamine, folic acid.  History of alcohol abuse.  Denies excessive drinking nowadays.  Opiate use disorder.  On buprenorphine.  Continue.  Debility deconditioning.  PT OT evaluation recommended skilled nursing facility placement but likely will go home with home health on discharge if continues to improve..   DVT prophylaxis: SCDs  Start: 08/02/23 1258   Code Status:     Code Status: Full Code  Disposition: Home likely in 1 to 2 days  Status is: Inpatient  The patient is inpatient because: Need for antibiotic, electrolyte imbalance, closer monitoring, likely need orthopedic intervention,   Family Communication: None at bedside  Consultants:  GI Verbal consult with ID. Hand surgery.  Will consult.  Procedures:  None  Antimicrobials:  Rocephin IV initiated 08/03/2023.  Anti-infectives (From admission, onward)    Start     Dose/Rate Route Frequency Ordered Stop   08/03/23 1300  cefTRIAXone (ROCEPHIN) 1 g in sodium chloride 0.9 % 100 mL IVPB        1 g 200 mL/hr over 30 Minutes Intravenous Every 24 hours 08/03/23 1155          Subjective: Today, patient was seen and examined at bedside.  Patient still complains of pain in the left middle finger with some redness and swelling.  Denies any tremors, sweating, nausea, vomiting.  Did have some loose stools yesterday.   Objective: Vitals:   08/03/23 2018 08/04/23 0444 08/04/23 0510 08/04/23 0834  BP: 129/84 (!) 148/96 105/60 (!) 166/95  Pulse: 95 80 96 86  Resp: 16 16 16    Temp: 99.8 F (37.7 C) 98.9 F (37.2 C) 98.9 F (37.2 C)   TempSrc: Oral Oral Oral   SpO2: 99% 99% 98%   Weight:      Height:        Intake/Output Summary (Last 24 hours) at 08/04/2023 1116 Last data filed at 08/04/2023 0950 Gross per 24 hour  Intake 820 ml  Output --  Net 820 ml   Filed Weights   08/01/23 1855 08/02/23 1358  Weight: 44.5 kg 44.6 kg    Physical Examination:  Body mass index is 15.87 kg/m.   General: Thinly built, not in obvious distress,  HENT:   No scleral pallor or icterus noted. Oral mucosa is moist.  Chest:  Clear breath sounds.  . No crackles or wheezes.  CVS: S1 &S2 heard. No murmur.  Regular rate and rhythm. Abdomen: Soft, nontender, nondistended.  Bowel sounds are heard.   Extremities: No cyanosis, clubbing or edema.  Peripheral  pulses are palpable.  Upper extremities with some nodularity in erythema swelling with central area of whitish spot on the left middle finger and similar lesion with nodularity on the right index finger with less erythema.Marland Kitchen Psych: Alert, awake and oriented, normal mood CNS:  No cranial nerve deficits.  Power equal in all extremities.   Skin: Warm and dry.  No rashes noted. Picture on admission.    Data Reviewed:   CBC: Recent Labs  Lab 08/02/23 1043 08/03/23 0632 08/04/23 0602  WBC 7.2 7.1 6.7  HGB 10.1* 10.0* 8.8*  HCT 28.1* 30.4* 26.0*  MCV 102.2* 107.8* 105.7*  PLT 191 183 210    Basic Metabolic Panel: Recent Labs  Lab 08/02/23 1043 08/02/23 1417 08/02/23 1748 08/03/23 0632 08/03/23 0827 08/04/23 0602  NA 136  --  135 137  --  136  K 2.7*  --  2.9* 3.7  --  2.9*  CL 94*  --  98 102  --  100  CO2 31  --  26 26  --  27  GLUCOSE 137*  --  157* 82  --  84  BUN 10  --  9 9  --  11  CREATININE 0.71  --  0.75 0.71  --  0.59  CALCIUM 7.3*  --  6.8* 7.0*  --  7.5*  MG  --  1.1*  --   --  1.6* 1.4*  PHOS  --  2.2*  --   --  2.3*  --     Liver Function Tests: Recent Labs  Lab 08/02/23 1043 08/03/23 0632  AST 109* 110*  ALT 23 32  ALKPHOS 133* 149*  BILITOT 1.7* 1.4*  PROT 6.2* 5.7*  ALBUMIN 2.9* 2.7*     Radiology Studies: No results found.     LOS: 1 day    Joycelyn Das, MD [facility] Available via Epic secure chat 7am-7pm After these hours, please refer to coverage provider listed on [email or web address] 08/04/2023, 11:16 AM

## 2023-08-04 NOTE — Consult Note (Cosign Needed)
Reason for Consult:Left long finger pain Referring Physician: Rebekah Chesterfield Pokhrel Time called: 1121 Time at bedside: 1151   [name] is an 52 y.o. female.  HPI: [name] was admitted 2d ago with N/V and abd pain. Incidentally she had an inflamed left long finger DIP joint. This has not responded to abx and hand surgery was consulted. She has had various similar problems with her fingers and has a hx/o gout. She has 2 similarly affected joints on the right hand but they are not acutely inflamed. They've been present for about 2 months, the left hand about 3d.   Past Medical History:  Diagnosis Date   Alcohol dependence in sustained full remission    Benign essential hypertension 04/09/2021   Chronic pain syndrome 04/09/2021   Crohn's disease - self reported. no biopsy to prove it    Dilated cbd, acquired 04/09/2021   Elevated troponin I level 09/12/2022   Gallstone pancreatitis 04/09/2021   Generalized anxiety disorder    Gout 04/09/2021   History of heroin use    Kidney stones    Major depressive disorder    Obstructive sleep apnea    Opiate withdrawal (HCC) - possible 09/12/2022    Past Surgical History:  Procedure Laterality Date   LEEP     LITHOTRIPSY     TUBAL LIGATION      Family History  Problem Relation Age of Onset   Thyroid disease Mother    Dementia Paternal Grandmother     Social History:  reports that she has been smoking cigarettes. She has never used smokeless tobacco. She reports current alcohol use of about 4.0 - 5.0 standard drinks of alcohol per week. She reports that she does not currently use drugs.  Allergies:  Allergies  Allergen Reactions   Penicillins Hives    Medications: I have reviewed the patient's current medications.  Results for orders placed or performed during the hospital encounter of 08/01/23 (from the past 48 hours)  Magnesium     Status: Abnormal   Collection Time: 08/02/23  2:17 PM  Result Value Ref Range   Magnesium 1.1 (L)  1.7 - 2.4 mg/dL    Comment: Performed at [facility], [address] [place], [place] [address]  Phosphorus     Status: Abnormal   Collection Time: 08/02/23  2:17 PM  Result Value Ref Range   Phosphorus 2.2 (L) 2.5 - 4.6 mg/dL    Comment: Performed at [facility], [address] [place], [place] [address]  Culture, blood (Routine X 2) w Reflex to ID Panel     Status: None (Preliminary result)   Collection Time: 08/02/23  5:46 PM   Specimen: BLOOD RIGHT HAND  Result Value Ref Range   Specimen Description      BLOOD RIGHT HAND Performed at [facility], [address], [place], [place] [address]    Special Requests      BOTTLES DRAWN AEROBIC ONLY Blood Culture adequate volume Performed at [facility], [address], [place], [place] [address]    Culture      NO GROWTH 2 DAYS Performed at [facility], [address] [place], [place] [address]    Report Status PENDING   Basic metabolic panel     Status: Abnormal   Collection Time: 08/02/23  5:48 PM  Result Value Ref Range   Sodium 135 135 - 145 mmol/L   Potassium 2.9 (L) 3.5 - 5.1 mmol/L  Chloride 98 98 - 111 mmol/L   CO2 26 22 - 32 mmol/L   Glucose, Bld 157 (H) 70 - 99 mg/dL    Comment: Glucose reference range applies only to samples taken after fasting for at least 8 hours.   BUN 9 6 - 20 mg/dL   Creatinine, Ser 1.61 0.44 - 1.00 mg/dL   Calcium 6.8 (L) 8.9 - 10.3 mg/dL   GFR, Estimated >09 >60 mL/min    Comment: (NOTE) Calculated using the CKD-EPI Creatinine Equation (2021)    Anion gap 11 5 - 15    Comment: Performed at [facility], [address], [place], [place] [address]  Culture, blood (Routine X 2) w Reflex to ID Panel     Status: None (Preliminary result)   Collection Time: 08/02/23  5:51 PM   Specimen: BLOOD RIGHT HAND  Result Value Ref Range   Specimen Description      BLOOD RIGHT HAND Performed at [facility], [address] [place], [place] [address]    Special Requests      BOTTLES DRAWN AEROBIC ONLY Blood Culture results may not be optimal due to an inadequate volume of blood received in culture bottles Performed at [facility], [address], [place], [place] [address]    Culture      NO GROWTH 2 DAYS Performed at [facility], [address] [place], [place] [address]    Report Status PENDING   Comprehensive metabolic panel     Status: Abnormal   Collection Time: 08/03/23  6:32 AM  Result Value Ref Range   Sodium 137 135 - 145 mmol/L   Potassium 3.7 3.5 - 5.1 mmol/L   Chloride 102 98 - 111 mmol/L   CO2 26 22 - 32 mmol/L   Glucose, Bld 82 70 - 99 mg/dL    Comment: Glucose reference range applies only to samples taken after fasting for at least 8 hours.   BUN 9 6 - 20 mg/dL   Creatinine, Ser 0.86 0.44 - 1.00 mg/dL   Calcium 7.0 (L) 8.9 - 10.3 mg/dL   Total Protein 5.7 (L) 6.5 - 8.1 g/dL   Albumin 2.7 (L) 3.5 - 5.0 g/dL   AST 578 (H) 15 - 41 U/L   ALT 32 0 - 44 U/L   Alkaline Phosphatase 149 (H) 38 - 126 U/L   Total Bilirubin 1.4 (H) <1.2 mg/dL   GFR, Estimated >46 >96 mL/min    Comment: (NOTE) Calculated using the CKD-EPI Creatinine Equation (2021)    Anion gap 9 5 - 15    Comment: Performed at [facility], [address], [place], [place] [address]  CBC     Status: Abnormal   Collection Time: 08/03/23  6:32 AM  Result Value Ref Range   WBC 7.1 4.0 - 10.5 K/uL   RBC 2.82 (L) 3.87 - 5.11 MIL/uL   Hemoglobin 10.0 (L) 12.0 - 15.0 g/dL   HCT 41.3 (L) 24.4 - 01.0 %   MCV 107.8 (H) 80.0 - 100.0 fL   MCH 35.5 (H) 26.0 - 34.0 pg   MCHC 32.9 30.0 - 36.0 g/dL   RDW 27.2 (H) 53.6 - 64.4 %   Platelets 183 150 - 400 K/uL   nRBC 0.0 0.0 - 0.2 %    Comment: Performed at [facility], [address] [place], [place] [address]  Phosphorus     Status: Abnormal   Collection Time:  08/03/23  8:27 AM  Result Value  Ref Range   Phosphorus 2.3 (L) 2.5 - 4.6 mg/dL    Comment: Performed at [facility], [address], [place], [place] [address]  Magnesium     Status: Abnormal   Collection Time: 08/03/23  8:27 AM  Result Value Ref Range   Magnesium 1.6 (L) 1.7 - 2.4 mg/dL    Comment: Performed at [facility], [address] [place], [place] [address]  Uric acid     Status: None   Collection Time: 08/03/23  8:28 AM  Result Value Ref Range   Uric Acid, Serum 6.6 2.5 - 7.1 mg/dL    Comment: Performed at [facility], [address] [place], [place] [address]  VITAMIN D 25 Hydroxy (Vit-D Deficiency, Fractures)     Status: None   Collection Time: 08/03/23  8:28 AM  Result Value Ref Range   Vit D, 25-Hydroxy 53.81 30 - 100 ng/mL    Comment: (NOTE) Vitamin D deficiency has been defined by the Institute of Medicine  and an Endocrine Society practice guideline as a level of serum 25-OH  vitamin D less than 20 ng/mL (1,2). The Endocrine Society went on to  further define vitamin D insufficiency as a level between 21 and 29  ng/mL (2).  1. IOM (Institute of Medicine). 2010. Dietary reference intakes for  calcium and D. [place]: [employer]. 2. Holick MF, Binkley Ferdinand, Bischoff-Ferrari HA, et al. Evaluation,  treatment, and prevention of vitamin D deficiency: an Endocrine  Society clinical practice guideline, JCEM. 2011 Jul; 96(7): 1911-30.  Performed at [facility], [address] [place], [place] [address]   Sedimentation rate     Status: Abnormal   Collection Time: 08/03/23  1:07 PM  Result Value Ref Range   Sed Rate 33 (H) 0 - 22 mm/hr    Comment: Performed at [facility], [address] [place], [place] [address]  Uric acid     Status: None   Collection Time: 08/03/23  1:07 PM  Result Value Ref Range   Uric Acid, Serum 5.7 2.5 - 7.1 mg/dL    Comment: HEMOLYSIS AT THIS LEVEL MAY AFFECT  RESULT Performed at [facility], [address] [place], [place] [address]   C-reactive protein     Status: Abnormal   Collection Time: 08/03/23  1:07 PM  Result Value Ref Range   CRP 15.0 (H) <1.0 mg/dL    Comment: Performed at [facility], [address] [place], [place] [address]  Basic metabolic panel     Status: Abnormal   Collection Time: 08/04/23  6:02 AM  Result Value Ref Range   Sodium 136 135 - 145 mmol/L   Potassium 2.9 (L) 3.5 - 5.1 mmol/L   Chloride 100 98 - 111 mmol/L   CO2 27 22 - 32 mmol/L   Glucose, Bld 84 70 - 99 mg/dL    Comment: Glucose reference range applies only to samples taken after fasting for at least 8 hours.   BUN 11 6 - 20 mg/dL   Creatinine, Ser 3.24 0.44 - 1.00 mg/dL   Calcium 7.5 (L) 8.9 - 10.3 mg/dL   GFR, Estimated >40 >10 mL/min    Comment: (NOTE) Calculated using the CKD-EPI Creatinine Equation (2021)    Anion gap 9 5 - 15    Comment: Performed at [facility], [address] [place],  [place] [address]  CBC     Status: Abnormal   Collection Time: 08/04/23  6:02 AM  Result Value Ref Range   WBC 6.7 4.0 - 10.5 K/uL   RBC 2.46 (L) 3.87 - 5.11 MIL/uL   Hemoglobin 8.8 (L) 12.0 - 15.0 g/dL   HCT 60.4 (L) 54.0 - 98.1 %   MCV 105.7 (H) 80.0 - 100.0 fL   MCH 35.8 (H) 26.0 - 34.0 pg   MCHC 33.8 30.0 - 36.0 g/dL   RDW 19.1 47.8 - 29.5 %   Platelets 210 150 - 400 K/uL   nRBC 0.0 0.0 - 0.2 %    Comment: Performed at [facility], [address], [place], [place] [address]  Magnesium     Status: Abnormal   Collection Time: 08/04/23  6:02 AM  Result Value Ref Range   Magnesium 1.4 (L) 1.7 - 2.4 mg/dL    Comment: Performed at [facility], [address] [place], [place] [address]    No results found.  Review of Systems  Constitutional:  Negative for chills, diaphoresis and fever.  HENT:  Negative for ear discharge, ear pain, hearing loss and tinnitus.    Eyes:  Negative for photophobia and pain.  Respiratory:  Negative for cough and shortness of breath.   Cardiovascular:  Negative for chest pain.  Gastrointestinal:  Negative for abdominal pain, nausea and vomiting.  Genitourinary:  Negative for dysuria, flank pain, frequency and urgency.  Musculoskeletal:  Positive for arthralgias (Left long finger). Negative for back pain, myalgias and neck pain.  Neurological:  Negative for dizziness and headaches.  Hematological:  Does not bruise/bleed easily.  Psychiatric/Behavioral:  The patient is not nervous/anxious.    Blood pressure (!) 166/95, pulse 86, temperature 98.9 F (37.2 C), temperature source Oral, resp. rate 16, height 5\' 6"  (1.676 m), weight 44.6 kg, SpO2 98%. Physical Exam Constitutional:      General: She is not in acute distress.    Appearance: She is well-developed. She is not diaphoretic.  HENT:     Head: Normocephalic and atraumatic.  Eyes:     General: No scleral icterus.       Right eye: No discharge.        Left eye: No discharge.     Conjunctiva/sclera: Conjunctivae normal.  Cardiovascular:     Rate and Rhythm: Normal rate and regular rhythm.  Pulmonary:     Effort: Pulmonary effort is normal. No respiratory distress.  Musculoskeletal:     Cervical back: Normal range of motion.     Comments: Right shoulder, elbow, wrist, digits- no skin wounds, index PIP and thumb IP joints swollen, mild TTP, tophi, no instability, no blocks to motion  Sens  Ax/R/M/U intact  Mot   Ax/ R/ PIN/ M/ AIN/ U intact  Rad 2+  Left shoulder, elbow, wrist, digits- no skin wounds, long finger DIP joint erythematous, edematous with dorsal leukotic patch, severe TTP but mild pain with PROM joint, no instability, no blocks to motion  Sens  Ax/R/M/U intact  Mot   Ax/ R/ PIN/ M/ AIN/ U intact  Rad 2+  Skin:    General: Skin is warm and dry.  Neurological:     Mental Status: She is alert.  Psychiatric:        Mood and Affect: Mood normal.         Behavior: Behavior normal.     Assessment/Plan: Left long finger pain -- Suspect this is manifestation of her  gout. Will start indocin, would recommend Prednisone burst and colchicine but will leave to attending to decide. Continue abx as she may have superimposed infection.    Freeman Caldron, PA-C Orthopedic Surgery [phone] 08/04/2023, 12:00 PM

## 2023-08-05 ENCOUNTER — Other Ambulatory Visit (HOSPITAL_COMMUNITY): Payer: Self-pay

## 2023-08-05 DIAGNOSIS — E876 Hypokalemia: Secondary | ICD-10-CM | POA: Diagnosis not present

## 2023-08-05 DIAGNOSIS — G4733 Obstructive sleep apnea (adult) (pediatric): Secondary | ICD-10-CM

## 2023-08-05 DIAGNOSIS — Z789 Other specified health status: Secondary | ICD-10-CM | POA: Diagnosis not present

## 2023-08-05 DIAGNOSIS — I10 Essential (primary) hypertension: Secondary | ICD-10-CM | POA: Diagnosis not present

## 2023-08-05 DIAGNOSIS — E440 Moderate protein-calorie malnutrition: Secondary | ICD-10-CM

## 2023-08-05 LAB — CBC
HCT: 28.8 % — ABNORMAL LOW (ref 36.0–46.0)
Hemoglobin: 9.4 g/dL — ABNORMAL LOW (ref 12.0–15.0)
MCH: 35.7 pg — ABNORMAL HIGH (ref 26.0–34.0)
MCHC: 32.6 g/dL (ref 30.0–36.0)
MCV: 109.5 fL — ABNORMAL HIGH (ref 80.0–100.0)
Platelets: 269 K/uL (ref 150–400)
RBC: 2.63 MIL/uL — ABNORMAL LOW (ref 3.87–5.11)
RDW: 15.3 % (ref 11.5–15.5)
WBC: 4.9 K/uL (ref 4.0–10.5)
nRBC: 0 % (ref 0.0–0.2)

## 2023-08-05 LAB — BASIC METABOLIC PANEL WITH GFR
Anion gap: 4 — ABNORMAL LOW (ref 5–15)
BUN: 13 mg/dL (ref 6–20)
CO2: 26 mmol/L (ref 22–32)
Calcium: 8.4 mg/dL — ABNORMAL LOW (ref 8.9–10.3)
Chloride: 102 mmol/L (ref 98–111)
Creatinine, Ser: 0.63 mg/dL (ref 0.44–1.00)
GFR, Estimated: >60 mL/min
Glucose, Bld: 208 mg/dL — ABNORMAL HIGH (ref 70–99)
Potassium: 5.4 mmol/L — ABNORMAL HIGH (ref 3.5–5.1)
Sodium: 132 mmol/L — ABNORMAL LOW (ref 135–145)

## 2023-08-05 LAB — PHOSPHORUS: Phosphorus: 2.1 mg/dL — ABNORMAL LOW (ref 2.5–4.6)

## 2023-08-05 LAB — MAGNESIUM: Magnesium: 2 mg/dL (ref 1.7–2.4)

## 2023-08-05 MED ORDER — INDOMETHACIN 50 MG PO CAPS
50.0000 mg | ORAL_CAPSULE | Freq: Three times a day (TID) | ORAL | 0 refills | Status: AC
  Filled 2023-08-05: qty 15, 5d supply, fill #0

## 2023-08-05 MED ORDER — COLCHICINE 0.6 MG PO TABS
0.3000 mg | ORAL_TABLET | Freq: Two times a day (BID) | ORAL | 0 refills | Status: DC
  Filled 2023-08-05: qty 5, 5d supply, fill #0

## 2023-08-05 MED ORDER — PREDNISONE 10 MG PO TABS
30.0000 mg | ORAL_TABLET | Freq: Every day | ORAL | 0 refills | Status: AC
  Filled 2023-08-05: qty 9, 3d supply, fill #0

## 2023-08-05 MED ORDER — LOPERAMIDE HCL 2 MG PO CAPS: 4.0000 mg | ORAL_CAPSULE | Freq: Four times a day (QID) | ORAL | 0 refills | Status: DC | PRN

## 2023-08-05 NOTE — Progress Notes (Signed)
Meds delivered to the pt in the room- primary nurse updated by this RN.

## 2023-08-05 NOTE — Progress Notes (Signed)
Pt discharged home with all belongings. Pt's rxs delivered to pt's room and sent with pt at time of discharge. Discharge education reviewed with pt, no questions or concerns at this time.

## 2023-08-05 NOTE — Discharge Summary (Signed)
Physician Discharge Summary  [name]:[id number] DOB: [birth year] DOA: 08/01/2023  PCP: [facility]  Admit date: 08/01/2023 Discharge date: 08/05/2023  Admitted From: Home  Discharge disposition: Home   Recommendations for Outpatient Follow-Up:   Follow up with your primary care provider in one week.  Patient will need follow-up for her gouty flare on her fingers and need appropriate treatment as outpatient Check CBC, BMP, magnesium in the next visit   Discharge Diagnosis:   Principal Problem:   Hypokalemia Active Problems:   Hypomagnesemia   Gout   Chronic pain syndrome   Benign essential hypertension   Obstructive sleep apnea   Major depressive disorder   Alcohol use   Malnutrition of moderate degree   Tobacco use   Hypophosphatemia   Abnormal LFTs (liver function tests)   Macrocytic anemia   Nodule of finger of both hands   Hypocalcemia   Protein-calorie malnutrition, severe   Discharge Condition: Improved.  Diet recommendation:   Regular.  Wound care: None.  Code status: Full.   History of Present Illness:   [name] is a 52 y.o. female with past medical history of hypertension, chronic pain syndrome, Crohn's disease, gallstone pancreatitis, generalized anxiety disorder, gout, major depressive disorder, obstructive sleep apnea presented to hospital with nausea and vomiting with diarrhea for several weeks with some abdominal discomfort. She did have colonoscopy 2004 which was normal. Also admitted to 20 pound weight loss and ongoing nausea. Family history of Crohn's disease in his father. Patient did have a CT scan done last month which showed colitis of the right colon and transverse colon. In the ED patient appeared to be chronically ill with diffuse abdominal tenderness. Labs showed no leukocytosis. BMP showed significant hypokalemia with potassium of 2.7. Chest x-ray with no active disease. CT renal study with nonobstructive  bilateral renal calculi without hydronephrosis. No colitis noted in this scan. Hip with pelvic x-ray showed no evidence of fracture or dislocation. GI was consulted from the ED and patient was admitted to hospital for further evaluation and treatment.   Hospital Course:   Following conditions were addressed during hospitalization as listed below,  Nausea, vomiting and nonbloody diarrhea.   CT scan last month with proximal colitis.  GI was consulted and plan for colonoscopy as outpatient..  GI has signed off at this time.  Had some loose stools but unable to collect C. difficile and GI pathogen panel.  Has improved at this time and tolerating oral diet.    Nodule of finger of both hands with left middle finger cellulitis. Briefly discussed with ID on-call by admitting provider.  Blood cultures negative so far. ESR CRP elevated..  Uric acid level within normal range.  Denied IV drug use in the last 20 years.  Clinical signs of erythema edema tenderness with nodules.  Hand surgery was consulted and impression was gouty flare.  Patient was put on colchicine, indomethacin and steroids with improvement.  Will continue for few more days on discharge.   Hypokalemia Had significantly low potassium.  Was replenished at this time.  Improved prior to discharge.     Hypomagnesemia Aggressively replace.  Magnesium prior to discharge was 2.0.     Hypophosphatemia Has been replenished.       Alcohol use disorder. Denies daily drinking.  Did not have any overt withdrawal symptoms in the hospital.  Advised multivitamins on discharge.    Chronic pain syndrome On Subutex, Robaxin, tramadol at home..     Benign  essential hypertension Continue amlodipine.     Obstructive sleep apnea Not on CPAP.     Major depressive disorder Follow-up with PCP as outpatient.   Nutrition Status: Nutrition Problem: Severe Malnutrition Etiology: social / environmental circumstances (food insecurity) Signs/Symptoms:  moderate fat depletion, severe muscle depletion, percent weight loss, energy intake < or equal to 50% for > or equal to 1 month Interventions: Ensure Enlive (each supplement provides 350kcal and 20 grams of protein), MVI  Body mass index is 15.87 kg/m.  In the setting of EtOH use.  Encouraged oral nutrition at home.     Tobacco use Patient denied a nicotine patch.  Encouraged to quit.     Abnormal LFTs (liver function tests) Likely secondary to alcohol abuse.      Macrocytic anemia Patient received thiamine, folic acid.  History of alcohol abuse.  Denies excessive drinking nowadays.   Opiate use disorder.  On buprenorphine.  Continue.   Debility deconditioning.  PT OT evaluation recommended skilled nursing facility placement but has improved and has been discharged home today.   Disposition.  At this time, patient is stable for disposition home with outpatient PCP follow-up.  Medical Consultants:   Orthopedic hand surgery  Procedures:    None Subjective:   Today, patient was seen and examined at bedside.  States that her nodules and redness has gone down some.  Otherwise feels okay.  No nausea vomiting diarrhea or abdominal pain.  No tremor or hallucinations.  Discharge Exam:   Vitals:   08/04/23 2028 08/05/23 0538  BP: 131/88 (!) 136/96  Pulse: 80 62  Resp: 16 16  Temp: 98.7 F (37.1 C) 98 F (36.7 C)  SpO2: 98% 100%   Vitals:   08/04/23 0834 08/04/23 1458 08/04/23 2028 08/05/23 0538  BP: (!) 166/95 (!) 117/92 131/88 (!) 136/96  Pulse: 86 86 80 62  Resp:  16 16 16   Temp:  98.6 F (37 C) 98.7 F (37.1 C) 98 F (36.7 C)  TempSrc:  Oral Oral Oral  SpO2:  100% 98% 100%  Weight:      Height:       General: Alert awake, not in obvious distress, thinly built. HENT: pupils equally reacting to light,  No scleral pallor or icterus noted. Oral mucosa is moist.  Chest:  Diminished breath sounds bilaterally. No crackles or wheezes.  CVS: S1 &S2 heard. No murmur.   Regular rate and rhythm. Abdomen: Soft, nontender, nondistended.  Bowel sounds are heard.   Extremities: No cyanosis, clubbing or edema.  Peripheral pulses are palpable.   Upper extremities with some nodularity in erythema swelling with central area of whitish spot on the left middle finger and similar lesion with nodularity on the right index finger with less erythema.Marland Kitchen  Psych: Alert, awake and oriented, normal mood CNS:  No cranial nerve deficits.  Power equal in all extremities.   Skin: Warm and dry.  No rashes noted.  The results of significant diagnostics from this hospitalization (including imaging, microbiology, ancillary and laboratory) are listed below for reference.     Diagnostic Studies:   DG Chest Portable 1 View Result Date: 08/02/2023 CLINICAL DATA:  Fever, shortness of breath EXAM: PORTABLE CHEST 1 VIEW COMPARISON:  07/10/2023 FINDINGS: The heart size and mediastinal contours are within normal limits. Both lungs are clear. The visualized skeletal structures are unremarkable. IMPRESSION: No active disease. Electronically Signed   By: Duanne Guess D.O.   On: 08/02/2023 11:22   CT Renal Stone Study Result Date:  08/01/2023 CLINICAL DATA:  Abdominal pain.  Concern for kidney stone. EXAM: CT ABDOMEN AND PELVIS WITHOUT CONTRAST TECHNIQUE: Multidetector CT imaging of the abdomen and pelvis was performed following the standard protocol without IV contrast. RADIATION DOSE REDUCTION: This exam was performed according to the departmental dose-optimization program which includes automated exposure control, adjustment of the mA and/or kV according to patient size and/or use of iterative reconstruction technique. COMPARISON:  CT abdomen pelvis dated 07/10/2023. FINDINGS: Evaluation of this exam is limited in the absence of intravenous contrast. Lower chest: Bibasilar linear atelectasis/scarring. There is coronary vascular calcification. No intra-abdominal free air or free fluid. Hepatobiliary:  Fatty liver. No biliary ductal dilatation. The gallbladder is distended. No calcified gallstone or pericholecystic fluid. Pancreas: Unremarkable. No pancreatic ductal dilatation or surrounding inflammatory changes. Spleen: Normal in size without focal abnormality. Adrenals/Urinary Tract: The adrenal glands are unremarkable. 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 Several nonobstructing bilateral renal calculi measure up to 3 mm. There is no hydronephrosis on either side. Subcentimeter left renal lesions are too small to characterize. The visualized ureters and urinary bladder appear unremarkable. Stomach/Bowel: There is thickened appearance of the proximal half of the colon with submucosal fat deposit, likely sequela of prior inflammatory process. Acute colitis is less likely. Clinical correlation is recommended. There is no bowel obstruction. Appendectomy. Vascular/Lymphatic: Mild aortoiliac atherosclerotic disease. The IVC is unremarkable. No portal venous gas. There is no adenopathy. Reproductive: The uterus is grossly unremarkable. No adnexal masses. Other: None Musculoskeletal: No acute osseous pathology. IMPRESSION: 1. Nonobstructing bilateral renal calculi. No hydronephrosis. 2. Fatty liver. 3. Thickened appearance of the proximal half of the colon with submucosal fat deposit, likely sequela of prior inflammatory process. Acute colitis is less likely. Clinical correlation is recommended. No bowel obstruction. 4.  Aortic Atherosclerosis (ICD10-I70.0). Electronically Signed   By: Elgie Collard M.D.   On: 08/01/2023 21:10   DG Hip Unilat W or Wo Pelvis 2-3 Views Right Result Date: 08/01/2023 CLINICAL DATA:  Chronic right hip pain. EXAM: DG HIP (WITH OR WITHOUT PELVIS) 2-3V RIGHT COMPARISON:  April 24, 2009. FINDINGS: There is no evidence of hip fracture or dislocation. There is no evidence of arthropathy or other focal bone abnormality. IMPRESSION: Negative. Electronically Signed    By: Lupita Raider M.D.   On: 08/01/2023 19:51     Labs:   Basic Metabolic Panel: Recent Labs  Lab 08/02/23 1043 08/02/23 1417 08/02/23 1748 08/03/23 2951 08/03/23 0827 08/04/23 0602 08/05/23 0724  NA 136  --  135 137  --  136 132*  K 2.7*  --  2.9* 3.7  --  2.9* 5.4*  CL 94*  --  98 102  --  100 102  CO2 31  --  26 26  --  27 26  GLUCOSE 137*  --  157* 82  --  84 208*  BUN 10  --  9 9  --  11 13  CREATININE 0.71  --  0.75 0.71  --  0.59 0.63  CALCIUM 7.3*  --  6.8* 7.0*  --  7.5* 8.4*  MG  --  1.1*  --   --  1.6* 1.4* 2.0  PHOS  --  2.2*  --   --  2.3*  --  2.1*   GFR Estimated Creatinine Clearance: 57.3 mL/min (by C-G formula based on SCr of 0.63 mg/dL). Liver Function Tests: Recent Labs  Lab 08/02/23 1043 08/03/23 0632  AST 109* 110*  ALT 23  32  ALKPHOS 133* 149*  BILITOT 1.7* 1.4*  PROT 6.2* 5.7*  ALBUMIN 2.9* 2.7*   Recent Labs  Lab 08/02/23 1043  LIPASE 24   No results for input(s): "AMMONIA" in the last 168 hours. Coagulation profile No results for input(s): "INR", "PROTIME" in the last 168 hours.  CBC: Recent Labs  Lab 08/02/23 1043 08/03/23 0632 08/04/23 0602 08/05/23 0724  WBC 7.2 7.1 6.7 4.9  HGB 10.1* 10.0* 8.8* 9.4*  HCT 28.1* 30.4* 26.0* 28.8*  MCV 102.2* 107.8* 105.7* 109.5*  PLT 191 183 210 269   Cardiac Enzymes: No results for input(s): "CKTOTAL", "CKMB", "CKMBINDEX", "TROPONINI" in the last 168 hours. BNP: Invalid input(s): "POCBNP" CBG: No results for input(s): "GLUCAP" in the last 168 hours. D-Dimer No results for input(s): "DDIMER" in the last 72 hours. Hgb A1c No results for input(s): "HGBA1C" in the last 72 hours. Lipid Profile No results for input(s): "CHOL", "HDL", "LDLCALC", "TRIG", "CHOLHDL", "LDLDIRECT" in the last 72 hours. Thyroid function studies No results for input(s): "TSH", "T4TOTAL", "T3FREE", "THYROIDAB" in the last 72 hours.  Invalid input(s): "FREET3" Anemia work up No results for input(s):  "VITAMINB12", "FOLATE", "FERRITIN", "TIBC", "IRON", "RETICCTPCT" in the last 72 hours. Microbiology Recent Results (from the past 240 hours)  Resp panel by RT-PCR (RSV, Flu A&B, Covid) Anterior Nasal Swab     Status: None   Collection Time: 08/02/23 10:09 AM   Specimen: Anterior Nasal Swab  Result Value Ref Range Status   SARS Coronavirus 2 by RT PCR NEGATIVE NEGATIVE Final    Comment: (NOTE) SARS-CoV-2 target nucleic acids are NOT DETECTED.  The SARS-CoV-2 RNA is generally detectable in upper respiratory specimens during the acute phase of infection. The lowest concentration of SARS-CoV-2 viral copies this assay can detect is 138 copies/mL. A negative result does not preclude SARS-Cov-2 infection and should not be used as the sole basis for treatment or other patient management decisions. A negative result may occur with  improper specimen collection/handling, submission of specimen other than nasopharyngeal swab, presence of viral mutation(s) within the areas targeted by this assay, and inadequate number of viral copies(<138 copies/mL). A negative result must be combined with clinical observations, patient history, and epidemiological information. The expected result is Negative.  Fact Sheet for Patients:  BloggerCourse.com  Fact Sheet for Healthcare Providers:  SeriousBroker.it  This test is no t yet approved or cleared by the [place] FDA and  has been authorized for detection and/or diagnosis of SARS-CoV-2 by FDA under an Emergency Use Authorization (EUA). This EUA will remain  in effect (meaning this test can be used) for the duration of the COVID-19 declaration under Section 564(b)(1) of the Act, 21 U.S.C.section 360bbb-3(b)(1), unless the authorization is terminated  or revoked sooner.       Influenza A by PCR NEGATIVE NEGATIVE Final   Influenza B by PCR NEGATIVE NEGATIVE Final    Comment: (NOTE) The Xpert Xpress  SARS-CoV-2/FLU/RSV plus assay is intended as an aid in the diagnosis of influenza from Nasopharyngeal swab specimens and should not be used as a sole basis for treatment. Nasal washings and aspirates are unacceptable for Xpert Xpress SARS-CoV-2/FLU/RSV testing.  Fact Sheet for Patients: BloggerCourse.com  Fact Sheet for Healthcare Providers: SeriousBroker.it  This test is not yet approved or cleared by the [place] FDA and has been authorized for detection and/or diagnosis of SARS-CoV-2 by FDA under an Emergency Use Authorization (EUA). This EUA will remain in effect (meaning this test can be used) for  the duration of the COVID-19 declaration under Section 564(b)(1) of the Act, 21 U.S.C. section 360bbb-3(b)(1), unless the authorization is terminated or revoked.     Resp Syncytial Virus by PCR NEGATIVE NEGATIVE Final    Comment: (NOTE) Fact Sheet for Patients: BloggerCourse.com  Fact Sheet for Healthcare Providers: SeriousBroker.it  This test is not yet approved or cleared by the [place] FDA and has been authorized for detection and/or diagnosis of SARS-CoV-2 by FDA under an Emergency Use Authorization (EUA). This EUA will remain in effect (meaning this test can be used) for the duration of the COVID-19 declaration under Section 564(b)(1) of the Act, 21 U.S.C. section 360bbb-3(b)(1), unless the authorization is terminated or revoked.  Performed at [facility], [address] [place], [place] [address]   Culture, blood (Routine X 2) w Reflex to ID Panel     Status: None (Preliminary result)   Collection Time: 08/02/23  5:46 PM   Specimen: BLOOD RIGHT HAND  Result Value Ref Range Status   Specimen Description   Final    BLOOD RIGHT HAND Performed at [facility], [address], [place], [place] [address]    Special Requests   Final     BOTTLES DRAWN AEROBIC ONLY Blood Culture adequate volume Performed at [facility], [address], [place], [place] [address]    Culture   Final    NO GROWTH 2 DAYS Performed at [facility], [address], [place], [place] [address]    Report Status PENDING  Incomplete  Culture, blood (Routine X 2) w Reflex to ID Panel     Status: None (Preliminary result)   Collection Time: 08/02/23  5:51 PM   Specimen: BLOOD RIGHT HAND  Result Value Ref Range Status   Specimen Description   Final    BLOOD RIGHT HAND Performed at [facility], [address] [place], [place] [address]    Special Requests   Final    BOTTLES DRAWN AEROBIC ONLY Blood Culture results may not be optimal due to an inadequate volume of blood received in culture bottles Performed at [facility], [address] [place], [place] [address]    Culture   Final    NO GROWTH 2 DAYS Performed at [facility], [address], [place], [place] [address]    Report Status PENDING  Incomplete     Discharge Instructions:   Discharge Instructions     Call MD for:  severe uncontrolled pain   Complete by: As directed    Diet general   Complete by: As directed    Discharge instructions   Complete by: As directed    Follow-up with your primary care provider in 1 week.  Seek medical attention for worsening symptoms. Take medications as prescribed.   Increase activity slowly   Complete by: As directed       Allergies as of 08/05/2023       Reactions   Penicillins Hives        Medication List     TAKE these medications    amLODipine 10 MG tablet Commonly known as: NORVASC Take 1 tablet (10 mg total) by mouth daily.   buprenorphine 8 MG Subl SL tablet Commonly known as: SUBUTEX DISSOLVE 1/2 TABLET UNDER THE TOUNGE TWICE A DAY   colchicine 0.6 MG tablet Take 0.5 tablets (0.3 mg total) by mouth 2 (two) times daily for 5 days.   ferrous sulfate  325 (65  FE) MG EC tablet Take 325 mg by mouth daily.   indomethacin 50 MG capsule Commonly known as: INDOCIN Take 1 capsule (50 mg total) by mouth 3 (three) times daily with meals for 5 days.   lisinopril 20 MG tablet Commonly known as: ZESTRIL Take 20 mg by mouth daily.   loperamide 2 MG capsule Commonly known as: IMODIUM Take 2 capsules (4 mg total) by mouth every 6 (six) hours as needed for diarrhea or loose stools.   pravastatin 20 MG tablet Commonly known as: PRAVACHOL Take 1 tablet (20 mg total) by mouth daily.   predniSONE 10 MG tablet Commonly known as: DELTASONE Take 3 tablets (30 mg total) by mouth daily for 3 days.   traMADol 50 MG tablet Commonly known as: ULTRAM Take 1-2 tablets (50-100 mg total) by mouth every 8 (eight) hours as needed. What changed:  how much to take when to take this reasons to take this   Ventolin HFA 108 (90 Base) MCG/ACT inhaler Generic drug: albuterol Inhale 2 puffs into the lungs 2 (two) times daily.        Follow-up Information     [facility] Follow up in 1 week(s).   Contact information: [address] [place] [address] [phone]                  Time coordinating discharge: 39 minutes  Signed:  Alika Eppes  [facility] 08/05/2023, 3:23 PM

## 2023-08-05 NOTE — TOC Initial Note (Signed)
Transition of Care [facility]) - Initial/Assessment Note    Patient Details  Name: [name] MRN: [id number] Date of Birth: 09/11/1970  Transition of Care [facility]) CM/SW Contact:    Georgie Chard, LCSW Phone Number: 08/05/2023, 10:27 AM  Clinical Narrative:                 CSW spoke to patient in regards to medication. At this time the patient does have medicaid. This CSW has advised patient to see if [facility] pharmacy can bill the patient for the co-pay. Patient stated at this time she has no other needs. Patient also reported that her boyfriend may be able to help purchase the medication. There are no further TOC needs at this time.   Expected Discharge Plan: Home/Self Care Barriers to Discharge: No Barriers Identified   Patient Goals and CMS Choice Patient states their goals for this hospitalization and ongoing recovery are:: Wants to get stronger to manage cooking and cleaning at home. CMS Medicare.gov Compare Post Acute Care list provided to:: Patient Choice offered to / list presented to : Patient  ownership interest in [facility].provided to:: Patient    Expected Discharge Plan and Services In-house Referral: NA Discharge Planning Services: CM Consult Post Acute Care Choice: Skilled Nursing Facility (No bed offers avaliable) Living arrangements for the past 2 months: Single Family Home Expected Discharge Date: 08/04/23               DME Arranged: N/A DME Agency: NA       HH Arranged: Refused HH (Patient is refusing home health states that she has 2 big dogs and home health will not be manageable) HH Agency: NA (Patient currently refusing due to her dogs being in the home)        Prior Living Arrangements/Services Living arrangements for the past 2 months: Single Family Home Lives with:: Spouse Patient language and need for interpreter reviewed:: No Do you feel safe going back to the place where you live?: Yes      Need for Family Participation in  Patient Care: No (Comment) Care giver support system in place?: Yes (comment) Current home services: DME (rollator, shower chair,) Criminal Activity/Legal Involvement Pertinent to Current Situation/Hospitalization: No - Comment as needed  Activities of Daily Living   ADL Screening (condition at time of admission) Independently performs ADLs?: No Does the patient have a NEW difficulty with bathing/dressing/toileting/self-feeding that is expected to last >3 days?: Yes (Initiates electronic notice to provider for possible OT consult) Does the patient have a NEW difficulty with getting in/out of bed, walking, or climbing stairs that is expected to last >3 days?: Yes (Initiates electronic notice to provider for possible PT consult) Does the patient have a NEW difficulty with communication that is expected to last >3 days?: Yes (Initiates electronic notice to provider for possible SLP consult) Is the patient deaf or have difficulty hearing?: No Does the patient have difficulty seeing, even when wearing glasses/contacts?: No Does the patient have difficulty concentrating, remembering, or making decisions?: No  Permission Sought/Granted Permission sought to share information with : Family Supports Permission granted to share information with : No              Emotional Assessment Appearance:: Appears stated age Attitude/Demeanor/Rapport: Gracious Affect (typically observed): Accepting, Pleasant Orientation: : Oriented to Self, Oriented to Place, Oriented to  Time, Oriented to Situation Alcohol / Substance Use: Not Applicable Psych Involvement: No (comment)  Admission diagnosis:  Hypokalemia [E87.6] Right  leg pain [M79.604] Right flank pain [R10.9] Patient Active Problem List   Diagnosis Date Noted   Hypocalcemia 08/03/2023   Protein-calorie malnutrition, severe 08/03/2023   Hypokalemia 08/02/2023   Hypophosphatemia 08/02/2023   Abnormal LFTs (liver function tests) 08/02/2023    Macrocytic anemia 08/02/2023   Nodule of finger of both hands 08/02/2023   Tobacco use 05/24/2023   Acquired deformity of joint of finger of right hand 05/24/2023   Hypomagnesemia 01/21/2023   Malnutrition of moderate degree 09/16/2022   Alcohol use 09/12/2022   Major depressive disorder    Generalized anxiety disorder    History of heroin use    Crohn's disease - self reported. no biopsy proving Crohn's disease. small biopsy on 03-18-2003    Obstructive sleep apnea    Gout 04/09/2021   Dilated cbd, acquired 04/09/2021   Chronic pain syndrome 04/09/2021   Benign essential hypertension 04/09/2021   Alcohol dependence in sustained full remission    PCP:  [facility]:   [facility] [address] [place] [address] Phone: [phone] Fax: [phone]  [facility] [address] [place] [address] Phone: [phone] Fax: [phone]     Social Drivers of Health (SDOH) Social History: SDOH Screenings   Food Insecurity: No Food Insecurity (08/02/2023)  Recent Concern: Food Insecurity - Food Insecurity Present (05/25/2023)  Housing: Low Risk  (08/02/2023)  Recent Concern: Housing - Medium Risk (05/25/2023)  Transportation Needs: No Transportation Needs (08/02/2023)  Recent Concern: Transportation Needs - Unmet Transportation Needs (05/25/2023)  Utilities: Not At Risk (08/02/2023)  Recent Concern: Utilities - At Risk (05/25/2023)  Alcohol Screen: Low Risk  (04/22/2021)  Depression (PHQ2-9): Low Risk  (01/04/2022)  Tobacco Use: High Risk (08/01/2023)   SDOH Interventions:     Readmission Risk Interventions    08/05/2023   10:22 AM  Readmission Risk Prevention Plan  Transportation Screening Complete  PCP or Specialist Appt within 5-7 Days Complete  Home Care Screening Complete  Medication Review (RN CM) Complete

## 2023-08-07 ENCOUNTER — Other Ambulatory Visit: Payer: Self-pay

## 2023-08-07 LAB — CULTURE, BLOOD (ROUTINE X 2)
Culture: NO GROWTH
Culture: NO GROWTH
Special Requests: ADEQUATE

## 2023-08-09 ENCOUNTER — Other Ambulatory Visit: Payer: Self-pay

## 2023-08-10 ENCOUNTER — Other Ambulatory Visit: Payer: Self-pay

## 2023-08-10 MED ORDER — BUPRENORPHINE HCL 8 MG SL SUBL
4.0000 mg | SUBLINGUAL_TABLET | Freq: Two times a day (BID) | SUBLINGUAL | 0 refills | Status: DC
  Filled 2023-08-10: qty 30, 30d supply, fill #0

## 2023-08-11 ENCOUNTER — Other Ambulatory Visit: Payer: Self-pay

## 2023-09-01 ENCOUNTER — Other Ambulatory Visit: Payer: Self-pay

## 2023-09-01 MED ORDER — TRAMADOL HCL 50 MG PO TABS
50.0000 mg | ORAL_TABLET | Freq: Three times a day (TID) | ORAL | 0 refills | Status: DC | PRN
  Filled 2023-09-01 – 2023-09-04 (×2): qty 180, 30d supply, fill #0

## 2023-09-01 MED ORDER — BUPRENORPHINE HCL 8 MG SL SUBL
4.0000 mg | SUBLINGUAL_TABLET | Freq: Two times a day (BID) | SUBLINGUAL | 0 refills | Status: DC
  Filled 2023-09-11: qty 30, 30d supply, fill #0

## 2023-09-04 ENCOUNTER — Other Ambulatory Visit: Payer: Self-pay

## 2023-09-05 ENCOUNTER — Other Ambulatory Visit: Payer: Self-pay

## 2023-09-11 ENCOUNTER — Other Ambulatory Visit: Payer: Self-pay

## 2023-09-27 ENCOUNTER — Other Ambulatory Visit: Payer: Self-pay

## 2023-09-27 MED ORDER — NA SULFATE-K SULFATE-MG SULF 17.5-3.13-1.6 GM/177ML PO SOLN
ORAL | 0 refills | Status: DC
  Filled 2023-09-27: qty 354, 2d supply, fill #0

## 2023-10-02 ENCOUNTER — Other Ambulatory Visit: Payer: Self-pay

## 2023-10-02 MED ORDER — ALLOPURINOL 100 MG PO TABS
200.0000 mg | ORAL_TABLET | Freq: Every day | ORAL | 1 refills | Status: DC
  Filled 2023-10-02: qty 60, 30d supply, fill #0
  Filled 2023-10-26: qty 60, 30d supply, fill #1

## 2023-10-03 ENCOUNTER — Other Ambulatory Visit: Payer: Self-pay

## 2023-10-03 MED ORDER — BUPRENORPHINE HCL 8 MG SL SUBL
4.0000 mg | SUBLINGUAL_TABLET | Freq: Two times a day (BID) | SUBLINGUAL | 0 refills | Status: DC
  Filled 2023-10-16: qty 30, 30d supply, fill #0

## 2023-10-03 MED ORDER — TRAMADOL HCL 50 MG PO TABS
50.0000 mg | ORAL_TABLET | Freq: Three times a day (TID) | ORAL | 0 refills | Status: DC | PRN
Start: 2023-10-02 — End: 2023-10-30
  Filled 2023-10-05: qty 180, 30d supply, fill #0

## 2023-10-05 ENCOUNTER — Other Ambulatory Visit: Payer: Self-pay

## 2023-10-16 ENCOUNTER — Other Ambulatory Visit: Payer: Self-pay

## 2023-10-17 ENCOUNTER — Other Ambulatory Visit: Payer: Self-pay

## 2023-10-18 ENCOUNTER — Other Ambulatory Visit: Payer: Self-pay

## 2023-10-26 ENCOUNTER — Other Ambulatory Visit: Payer: Self-pay

## 2023-10-27 ENCOUNTER — Other Ambulatory Visit: Payer: Self-pay

## 2023-10-30 ENCOUNTER — Other Ambulatory Visit: Payer: Self-pay

## 2023-10-30 MED ORDER — ALLOPURINOL 100 MG PO TABS
200.0000 mg | ORAL_TABLET | Freq: Every day | ORAL | 1 refills | Status: DC
  Filled 2023-10-30: qty 60, 30d supply, fill #0

## 2023-10-30 MED ORDER — BUPRENORPHINE HCL 8 MG SL SUBL
4.0000 mg | SUBLINGUAL_TABLET | Freq: Two times a day (BID) | SUBLINGUAL | 0 refills | Status: DC
  Filled 2023-10-30 – 2023-11-15 (×2): qty 30, 30d supply, fill #0

## 2023-10-30 MED ORDER — TRAMADOL HCL 50 MG PO TABS
50.0000 mg | ORAL_TABLET | Freq: Three times a day (TID) | ORAL | 0 refills | Status: DC | PRN
  Filled 2023-10-30: qty 15, 5d supply, fill #0
  Filled 2023-11-02: qty 180, 30d supply, fill #0

## 2023-11-02 ENCOUNTER — Other Ambulatory Visit: Payer: Self-pay

## 2023-11-03 ENCOUNTER — Other Ambulatory Visit: Payer: Self-pay

## 2023-11-08 ENCOUNTER — Other Ambulatory Visit: Payer: Self-pay

## 2023-11-08 MED ORDER — AMLODIPINE BESYLATE 5 MG PO TABS
5.0000 mg | ORAL_TABLET | Freq: Every day | ORAL | 3 refills | Status: AC
  Filled 2023-11-08: qty 90, 90d supply, fill #0

## 2023-11-15 ENCOUNTER — Other Ambulatory Visit: Payer: Self-pay

## 2023-11-16 ENCOUNTER — Other Ambulatory Visit: Payer: Self-pay

## 2023-12-01 ENCOUNTER — Other Ambulatory Visit: Payer: Self-pay

## 2023-12-01 MED ORDER — BUPRENORPHINE HCL 8 MG SL SUBL
4.0000 mg | SUBLINGUAL_TABLET | Freq: Two times a day (BID) | SUBLINGUAL | 0 refills | Status: DC
  Filled 2023-12-01 – 2023-12-18 (×2): qty 30, 30d supply, fill #0

## 2023-12-01 MED ORDER — TRAMADOL HCL 50 MG PO TABS
50.0000 mg | ORAL_TABLET | Freq: Three times a day (TID) | ORAL | 0 refills | Status: DC | PRN
Start: 2023-11-30 — End: 2024-07-06
  Filled 2023-12-01: qty 180, 30d supply, fill #0

## 2023-12-01 MED ORDER — ALLOPURINOL 100 MG PO TABS
200.0000 mg | ORAL_TABLET | Freq: Every day | ORAL | 1 refills | Status: DC
  Filled 2023-12-01: qty 180, 90d supply, fill #0

## 2023-12-18 ENCOUNTER — Other Ambulatory Visit: Payer: Self-pay

## 2023-12-19 ENCOUNTER — Other Ambulatory Visit: Payer: Self-pay

## 2023-12-20 ENCOUNTER — Other Ambulatory Visit: Payer: Self-pay

## 2023-12-20 MED ORDER — CREON 36000-114000 UNITS PO CPEP
ORAL_CAPSULE | ORAL | 0 refills | Status: DC
  Filled 2023-12-20: qty 300, 30d supply, fill #0

## 2023-12-29 ENCOUNTER — Other Ambulatory Visit: Payer: Self-pay

## 2023-12-29 MED ORDER — BUPRENORPHINE HCL 8 MG SL SUBL
8.0000 mg | SUBLINGUAL_TABLET | SUBLINGUAL | 0 refills | Status: DC
  Filled 2023-12-29 – 2024-01-18 (×2): qty 30, 30d supply, fill #0

## 2023-12-29 MED ORDER — TRAMADOL HCL 50 MG PO TABS
ORAL_TABLET | ORAL | 0 refills | Status: DC
  Filled 2023-12-29 – 2024-01-01 (×2): qty 180, 30d supply, fill #0

## 2024-01-01 ENCOUNTER — Other Ambulatory Visit: Payer: Self-pay

## 2024-01-02 ENCOUNTER — Other Ambulatory Visit: Payer: Self-pay

## 2024-01-11 ENCOUNTER — Other Ambulatory Visit (HOSPITAL_COMMUNITY): Payer: Self-pay | Admitting: Surgery

## 2024-01-11 DIAGNOSIS — R1013 Epigastric pain: Secondary | ICD-10-CM

## 2024-01-18 ENCOUNTER — Other Ambulatory Visit: Payer: Self-pay

## 2024-01-19 ENCOUNTER — Other Ambulatory Visit: Payer: Self-pay

## 2024-01-23 ENCOUNTER — Encounter (HOSPITAL_COMMUNITY)
Admission: RE | Admit: 2024-01-23 | Discharge: 2024-01-23 | Disposition: A | Discharge status: Home / Self Care | Payer: MEDICAID | Source: Ambulatory Visit | Attending: Surgery | Admitting: Surgery

## 2024-01-23 DIAGNOSIS — R1013 Epigastric pain: Secondary | ICD-10-CM | POA: Diagnosis present

## 2024-01-23 MED ORDER — TECHNETIUM TC 99M MEBROFENIN IV KIT
5.0000 | PACK | Freq: Once | INTRAVENOUS | Status: AC | PRN
  Administered 2024-01-23: 5 via INTRAVENOUS

## 2024-01-30 ENCOUNTER — Other Ambulatory Visit: Payer: Self-pay

## 2024-01-30 MED ORDER — BUPRENORPHINE HCL 8 MG SL SUBL
4.0000 mg | SUBLINGUAL_TABLET | Freq: Two times a day (BID) | SUBLINGUAL | 0 refills | Status: DC
  Filled 2024-02-19: qty 30, 30d supply, fill #0

## 2024-01-30 MED ORDER — TRAMADOL HCL 50 MG PO TABS
100.0000 mg | ORAL_TABLET | Freq: Three times a day (TID) | ORAL | 0 refills | Status: DC | PRN
  Filled 2024-01-30 – 2024-02-01 (×2): qty 180, 30d supply, fill #0

## 2024-02-01 ENCOUNTER — Other Ambulatory Visit: Payer: Self-pay

## 2024-02-01 ENCOUNTER — Ambulatory Visit: Payer: MEDICAID

## 2024-02-01 ENCOUNTER — Ambulatory Visit: Payer: MEDICAID | Attending: Internal Medicine | Admitting: Internal Medicine

## 2024-02-01 ENCOUNTER — Encounter: Payer: Self-pay | Admitting: Internal Medicine

## 2024-02-01 VITALS — BP 122/83 | HR 66 | Resp 14 | Ht 65.5 in | Wt 110.0 lb

## 2024-02-01 DIAGNOSIS — M1A9XX1 Chronic gout, unspecified, with tophus (tophi): Secondary | ICD-10-CM | POA: Diagnosis present

## 2024-02-01 DIAGNOSIS — M10472 Other secondary gout, left ankle and foot: Secondary | ICD-10-CM | POA: Diagnosis present

## 2024-02-01 DIAGNOSIS — N200 Calculus of kidney: Secondary | ICD-10-CM | POA: Insufficient documentation

## 2024-02-01 NOTE — Progress Notes (Signed)
 Office Visit Note  Patient: [name]             Date of Birth: 1971/05/11           MRN: [id number]             PCP: [facility] Referring: Jerrye Lamar CHRISTELLA Mickey., MD Visit Date: 02/01/2024   Subjective:  New Patient (Initial Visit) (Patient states she has pain all over. Patient states sometimes she will have problems with the index finger and thumb on her right hand. )   Discussed the use of AI scribe software for clinical note transcription with the patient, who gave verbal consent to proceed.  History of Present Illness   [name] is a 53 year old female with arthritis and gout who presents with chronic joint pain and swelling.  She has experienced arthritis symptoms for many years, with significant issues in her hands, feet, and ankles. 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 Episodes of severe pain have led to emergency room visits and the use of crutches and a cane. These symptoms began approximately six to seven years ago.  She experiences constant pain in her knees, ankles, and feet, with occasional swelling if she is on her feet too much. She uses a walker for mobility when necessary, especially for trips to a nearby store. Her hands also swell and are described as 'snap, crackle, pop.' She takes allopurinol , two tablets daily, and uses tramadol  as needed for pain flare-ups.  She reports a tingling sensation in her hip that started a few months ago, which is uncomfortable and painful but does not radiate. She also mentions a history of bulging discs in her lumbar spine, specifically L3, L4, and L5, and has had fluoroscopy spinal injections in the past, though she had a reaction to the steroid used.  Her family history includes arthritis, with her mother and sister also affected. Her sister was wheelchair-bound due to severe spinal arthritis. She has a history of kidney stones, which  are calcium-based, and has undergone emergency procedures in the past to address ureteral blockages.  She has a digital cyst on her thumb that has required drainage in the past, with a thick, chalky white substance noted upon drainage. No numbness in fingers or toes but tingling in the hip.  Uric acid 07/2023 5.7 05/2023 13.6  Imaging reviewed 08/04/23 Xray Left hand IMPRESSION: 1. Moderate third DIP osteoarthritis. 2. Mild-to-moderate thumb interphalangeal and second, fourth, and fifth DIP osteoarthritis.  Activities of Daily Living:  Patient reports morning stiffness for 2 hours.   Patient Reports nocturnal pain.  Difficulty dressing/grooming: Reports Difficulty climbing stairs: Reports Difficulty getting out of chair: Reports Difficulty using hands for taps, buttons, cutlery, and/or writing: Reports  Review of Systems  Constitutional:  Positive for fatigue.  HENT:  Positive for mouth dryness. Negative for mouth sores.   Eyes:  Positive for dryness.  Respiratory:  Positive for shortness of breath.   Cardiovascular:  Positive for chest pain. Negative for palpitations.  Gastrointestinal:  Positive for constipation and diarrhea. Negative for blood in stool.  Endocrine: Negative for increased urination.  Genitourinary:  Positive for involuntary urination.  Musculoskeletal:  Positive for joint pain, gait problem, joint pain, joint swelling, myalgias, muscle weakness, morning stiffness, muscle tenderness and myalgias.  Skin:  Positive for color change. Negative for rash, hair loss and sensitivity to sunlight.  Allergic/Immunologic: Positive for susceptible to  infections.  Neurological:  Positive for dizziness and headaches.  Hematological:  Negative for swollen glands.  Psychiatric/Behavioral:  Negative for depressed mood and sleep disturbance. The patient is not nervous/anxious.     PMFS History:  Patient Active Problem List   Diagnosis Date Noted   Recurrent kidney stones  02/01/2024   Tophaceous gout 02/01/2024   Hypocalcemia 08/03/2023   Protein-calorie malnutrition, severe 08/03/2023   Hypokalemia 08/02/2023   Hypophosphatemia 08/02/2023   Abnormal LFTs (liver function tests) 08/02/2023   Macrocytic anemia 08/02/2023   Nodule of finger of both hands 08/02/2023   Tobacco use 05/24/2023   Acquired deformity of joint of finger of right hand 05/24/2023   Hypomagnesemia 01/21/2023   Malnutrition of moderate degree 09/16/2022   Alcohol use 09/12/2022   Major depressive disorder    Generalized anxiety disorder    History of heroin use    Crohn's disease - self reported. no biopsy proving Crohn's disease. small biopsy on 03-18-2003    Obstructive sleep apnea    Gout 04/09/2021   Dilated cbd, acquired 04/09/2021   Chronic pain syndrome 04/09/2021   Benign essential hypertension 04/09/2021   Alcohol dependence in sustained full remission     Past Medical History:  Diagnosis Date   Alcohol dependence in sustained full remission    Benign essential hypertension 04/09/2021   Chronic pain syndrome 04/09/2021   Crohn's disease - self reported. no biopsy to prove it    Dilated cbd, acquired 04/09/2021   Elevated troponin I level 09/12/2022   Gallstone pancreatitis 04/09/2021   Generalized anxiety disorder    Gout 04/09/2021   History of heroin use    Kidney stones    Major depressive disorder    Obstructive sleep apnea    Opiate withdrawal (HCC) - possible 09/12/2022    Family History  Problem Relation Age of Onset   Thyroid disease Mother    Dementia Paternal Grandmother    Past Surgical History:  Procedure Laterality Date   LEEP     LITHOTRIPSY     TUBAL LIGATION     Social History   Social History Narrative   Right handed   One story home   Caffeine yes   Immunization History  Administered Date(s) Administered   Influenza, Seasonal, Injecte, Preservative Fre 07/13/2023   Influenza,inj,Quad PF,6+ Mos 06/14/2021   PFIZER(Purple  Top)SARS-COV-2 Vaccination 11/14/2019, 12/09/2019   Tdap 04/21/2021     Objective: Vital Signs: BP 122/83 (BP Location: Right Arm, Patient Position: Sitting, Cuff Size: Normal)   Pulse 66   Resp 14   Ht 5' 5.5 (1.664 m)   Wt 110 lb (49.9 kg)   BMI 18.03 kg/m    Physical Exam  Eyes:     Conjunctiva/sclera: Conjunctivae normal.    Cardiovascular:     Rate and Rhythm: Normal rate and regular rhythm.  Pulmonary:     Effort: Pulmonary effort is normal.     Breath sounds: Normal breath sounds.   Skin:    General: Skin is warm and dry.   Neurological:     Mental Status: She is alert.   Psychiatric:        Mood and Affect: Mood normal.      Musculoskeletal Exam:  Shoulders full ROM no tenderness or swelling Elbows full ROM no tenderness or swelling Wrists full ROM no tenderness or swelling Right index finger with nodule suspicious for tophus, thumb interphalangeal joint with dorsal cyst, left hand with mild distal Heberden's nodes Knees full ROM  no tenderness or swelling Degenerative joint changes in toes on both feet, no palpable swelling   Investigation: No additional findings.  Imaging: NM Hepato W/EF Result Date: 01/23/2024 CLINICAL DATA:  Epigastric abdominal pain EXAM: NUCLEAR MEDICINE HEPATOBILIARY IMAGING WITH GALLBLADDER EF TECHNIQUE: Sequential images of the abdomen were obtained out to 60 minutes following intravenous administration of radiopharmaceutical. After oral ingestion of Ensure, gallbladder ejection fraction was determined. At 60 min, normal ejection fraction is greater than 33%. RADIOPHARMACEUTICALS:  5.0 mCi Tc-104m  Choletec  IV COMPARISON:  CT August 01, 2023 FINDINGS: Prompt uptake and biliary excretion of activity by the liver is seen. Gallbladder activity is visualized, consistent with patency of cystic duct. Biliary activity passes into small bowel, consistent with patent common bile duct. Calculated gallbladder ejection fraction is 69%. (Normal  gallbladder ejection fraction with Ensure is greater than 33% and less than 80%.) IMPRESSION: 1.  Patent cystic and common bile ducts. 2.  Normal gallbladder ejection fraction. Electronically Signed   By: Reyes Holder M.D.   On: 01/23/2024 14:00    Recent Labs: Lab Results  Component Value Date   WBC 4.9 08/05/2023   HGB 9.4 (L) 08/05/2023   PLT 269 08/05/2023   NA 138 02/01/2024   K 4.9 02/01/2024   CL 101 02/01/2024   CO2 26 02/01/2024   GLUCOSE 100 (H) 02/01/2024   BUN 21 02/01/2024   CREATININE 0.90 02/01/2024   BILITOT 0.3 02/01/2024   ALKPHOS 149 (H) 08/03/2023   AST 21 02/01/2024   ALT 12 02/01/2024   PROT 7.2 02/01/2024   ALBUMIN 2.7 (L) 08/03/2023   CALCIUM 9.7 02/01/2024   GFRAA  05/15/2008    >60        The eGFR has been calculated using the MDRD equation. This calculation has not been validated in all clinical    Speciality Comments: No specialty comments available.  Procedures:  No procedures performed Allergies: Penicillins   Assessment / Plan:     Visit Diagnoses: Tophaceous gout - Plan: Sedimentation rate, Uric acid, Comprehensive metabolic panel with GFR, XR Hand 2 View Right Chronic gout with tophi and urate crystal deposits in joints, exacerbated by standing. Managed with allopurinol  currently 200 mg daily, potential need for dose adjustment or alternative medication based on lab results. Colchicine  considered for acute inflammation but would not favor chronic long-term prophylaxis due to potential side effects and contraindications. - Order blood tests for uric acid levels and other parameters. - Order x-ray of right hand to evaluate joint changes and tophi. - Consider adjusting allopurinol  dosage based on lab results. - Consider colchicine  for acute inflammation pending lab results.  Osteoarthritis Chronic osteoarthritis with persistent joint pain and swelling. Current medications do not reverse or prevent progression. Focus on managing  inflammation to prevent further joint damage. - Provided information on osteoarthritis and potential supplements.  Chronic kidney stones Chronic calcium kidney stones with past surgical intervention.  Does not have any definite known history of uric acid stones.   Orders: Orders Placed This Encounter  Procedures   XR Hand 2 View Right   Sedimentation rate   Uric acid   Comprehensive metabolic panel with GFR   No orders of the defined types were placed in this encounter.    Follow-Up Instructions: Return in about 2 months (around 04/02/2024) for New pt gout f/u 2mos.   Lonni LELON Ester, MD  Note - This record has been created using [employer].  Chart creation errors have been sought, but may not  always  have been located. Such creation errors do not reflect on  the standard of medical care.

## 2024-02-02 ENCOUNTER — Other Ambulatory Visit: Payer: Self-pay

## 2024-02-02 LAB — COMPREHENSIVE METABOLIC PANEL WITH GFR
AG Ratio: 1.6 (calc) (ref 1.0–2.5)
ALT: 12 U/L (ref 6–29)
AST: 21 U/L (ref 10–35)
Albumin: 4.4 g/dL (ref 3.6–5.1)
Alkaline phosphatase (APISO): 71 U/L (ref 37–153)
BUN: 21 mg/dL (ref 7–25)
CO2: 26 mmol/L (ref 20–32)
Calcium: 9.7 mg/dL (ref 8.6–10.4)
Chloride: 101 mmol/L (ref 98–110)
Creat: 0.9 mg/dL (ref 0.50–1.03)
Globulin: 2.8 g/dL (ref 1.9–3.7)
Glucose, Bld: 100 mg/dL — ABNORMAL HIGH (ref 65–99)
Potassium: 4.9 mmol/L (ref 3.5–5.3)
Sodium: 138 mmol/L (ref 135–146)
Total Bilirubin: 0.3 mg/dL (ref 0.2–1.2)
Total Protein: 7.2 g/dL (ref 6.1–8.1)
eGFR: 76 mL/min/{1.73_m2} (ref >=60)

## 2024-02-02 LAB — SEDIMENTATION RATE: Sed Rate: 2 mm/h (ref 0–30)

## 2024-02-02 LAB — URIC ACID: Uric Acid, Serum: 5.2 mg/dL (ref 2.5–7.0)

## 2024-02-06 ENCOUNTER — Telehealth: Payer: Self-pay

## 2024-02-06 NOTE — Telephone Encounter (Signed)
 Contacted the patient and patient states she has received a message about lab results. Patient states she does not have MyChart and did not know anything. Advised the patient Dr. Rodell Citrin would go over her lab results and xray's at her new patient follow up appointment. Patient verbalized understanding.

## 2024-02-06 NOTE — Telephone Encounter (Signed)
 FYI

## 2024-02-19 ENCOUNTER — Other Ambulatory Visit: Payer: Self-pay

## 2024-02-28 ENCOUNTER — Other Ambulatory Visit: Payer: Self-pay

## 2024-02-28 MED ORDER — BUPRENORPHINE HCL 8 MG SL SUBL
4.0000 mg | SUBLINGUAL_TABLET | Freq: Two times a day (BID) | SUBLINGUAL | 0 refills | Status: DC
  Filled 2024-02-28 – 2024-03-21 (×2): qty 30, 30d supply, fill #0

## 2024-02-28 MED ORDER — TRAMADOL HCL 50 MG PO TABS
50.0000 mg | ORAL_TABLET | Freq: Three times a day (TID) | ORAL | 0 refills | Status: DC
  Filled 2024-02-28: qty 180, 60d supply, fill #0
  Filled 2024-03-04: qty 180, 30d supply, fill #0

## 2024-03-04 ENCOUNTER — Other Ambulatory Visit: Payer: Self-pay

## 2024-03-04 MED ORDER — PRAVASTATIN SODIUM 20 MG PO TABS
20.0000 mg | ORAL_TABLET | Freq: Every day | ORAL | 2 refills | Status: DC
  Filled 2024-03-04: qty 90, 90d supply, fill #0
  Filled 2024-06-04: qty 90, 90d supply, fill #1

## 2024-03-06 ENCOUNTER — Other Ambulatory Visit: Payer: Self-pay

## 2024-03-14 ENCOUNTER — Other Ambulatory Visit (HOSPITAL_COMMUNITY): Payer: Self-pay

## 2024-03-14 MED ORDER — ONDANSETRON 4 MG PO TBDP
4.0000 mg | ORAL_TABLET | Freq: Three times a day (TID) | ORAL | 2 refills | Status: AC | PRN
  Filled 2024-03-14: qty 90, 30d supply, fill #0
  Filled 2024-06-03: qty 90, 30d supply, fill #1
  Filled 2024-08-23: qty 90, 30d supply, fill #0
  Filled 2024-08-23: qty 90, 30d supply, fill #2

## 2024-03-18 ENCOUNTER — Other Ambulatory Visit: Payer: Self-pay

## 2024-03-18 MED ORDER — PRAVASTATIN SODIUM 20 MG PO TABS: 20.0000 mg | ORAL_TABLET | Freq: Every day | ORAL | 2 refills | Status: DC

## 2024-03-21 ENCOUNTER — Other Ambulatory Visit: Payer: Self-pay

## 2024-03-22 ENCOUNTER — Other Ambulatory Visit: Payer: Self-pay

## 2024-03-29 ENCOUNTER — Other Ambulatory Visit: Payer: Self-pay

## 2024-03-29 MED ORDER — BUPRENORPHINE HCL 8 MG SL SUBL: 4.0000 mg | SUBLINGUAL_TABLET | Freq: Two times a day (BID) | SUBLINGUAL | 0 refills | Status: DC

## 2024-03-29 MED ORDER — TRAMADOL HCL 50 MG PO TABS
50.0000 mg | ORAL_TABLET | Freq: Three times a day (TID) | ORAL | 0 refills | Status: DC | PRN
  Filled 2024-04-03 – 2024-04-05 (×3): qty 180, 30d supply, fill #0
  Filled ????-??-??: fill #0

## 2024-04-03 ENCOUNTER — Other Ambulatory Visit: Payer: Self-pay

## 2024-04-03 ENCOUNTER — Encounter: Payer: Self-pay | Admitting: Internal Medicine

## 2024-04-03 ENCOUNTER — Ambulatory Visit: Payer: MEDICAID | Attending: Internal Medicine | Admitting: Internal Medicine

## 2024-04-03 VITALS — BP 136/82 | HR 77 | Resp 17 | Ht 66.0 in | Wt 106.4 lb

## 2024-04-03 DIAGNOSIS — M10472 Other secondary gout, left ankle and foot: Secondary | ICD-10-CM | POA: Diagnosis present

## 2024-04-03 DIAGNOSIS — M159 Polyosteoarthritis, unspecified: Secondary | ICD-10-CM | POA: Insufficient documentation

## 2024-04-03 DIAGNOSIS — M1A9XX1 Chronic gout, unspecified, with tophus (tophi): Secondary | ICD-10-CM | POA: Diagnosis present

## 2024-04-03 MED ORDER — CELECOXIB 100 MG PO CAPS
ORAL_CAPSULE | ORAL | 2 refills | Status: DC
  Filled 2024-04-03 (×2): qty 60, 30d supply, fill #0
  Filled 2024-05-03: qty 60, 30d supply, fill #1
  Filled 2024-06-04: qty 60, 30d supply, fill #2

## 2024-04-03 MED ORDER — PREDNISONE 5 MG PO TABS
ORAL_TABLET | ORAL | 0 refills | Status: AC
  Filled 2024-04-03: qty 30, 12d supply, fill #0

## 2024-04-03 NOTE — Progress Notes (Signed)
 Office Visit Note  Patient: [name]             Date of Birth: [birth year]           MRN: [id number]             PCP: [facility] Referring: [facility] Visit Date: 04/03/2024   Subjective:  Follow-up (Patient still having pain)   Discussed the use of AI scribe software for clinical note transcription with the patient, who gave verbal consent to proceed.  History of Present Illness   [name] is a 53 year old female with osteoarthritis who presents with joint pain and swelling.  She experiences significant joint pain and swelling, particularly in her hands, feet, and ankles, which is severe enough to prevent her from putting full pressure on her feet and ankles.  She is ambulatory with a rolling walker.  Also continues to see swelling with a lot of joint pain and stiffness in multiple finger joints.  Lab test are initial visit looks good with a normal sedimentation rate of 2 and uric acid was at goal of 5.2 on the allopurinol  200 mg daily.  She manages her pain with tramadol , which provides relief but does not address swelling. She also takes BC powder daily.  She is not currently on any maintenance NSAID drug for joint inflammation. Her family history is notable for osteoarthritis, affecting her mother and sister. Her sister's condition was severe enough to necessitate a wheelchair due to spinal involvement.  She reports difficulty with daily activities such as cooking, relying on the microwave for meals due to her symptoms. She reports difficulty with very poor appetite, managed with Creon  which is partially effective.       Previous HPI 02/01/24 [name] is a 53 year old female with arthritis and gout who presents with chronic joint pain and swelling.   She has experienced arthritis symptoms for many years, with significant issues in her hands, feet, and ankles. 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 Episodes of severe pain have led to emergency room visits and the use of crutches and a cane. These symptoms began approximately six to seven years ago.   She experiences constant pain in her knees, ankles, and feet, with occasional swelling if she is on her feet too much. She uses a walker for mobility when necessary, especially for trips to a nearby store. Her hands also swell and are described as 'snap, crackle, pop.' She takes allopurinol , two tablets daily, and uses tramadol  as needed for pain flare-ups.   She reports a tingling sensation in her hip that started a few months ago, which is uncomfortable and painful but does not radiate. She also mentions a history of bulging discs in her lumbar spine, specifically L3, L4, and L5, and has had fluoroscopy spinal injections in the past, though she had a reaction to the steroid used.   Her family history includes arthritis, with her mother and sister also affected. Her sister was wheelchair-bound due to severe spinal arthritis. She has a history of kidney stones, which are calcium-based, and has undergone emergency procedures in the past to address ureteral blockages.   She has a digital cyst on her thumb that has required drainage in the past, with a thick, chalky white substance noted upon drainage. No numbness in fingers or toes but tingling in the hip.   Uric acid 07/2023  5.7 05/2023 13.6   Imaging reviewed 08/04/23 Xray Left hand IMPRESSION: 1. Moderate third DIP osteoarthritis. 2. Mild-to-moderate thumb interphalangeal and second, fourth, and fifth DIP osteoarthritis.   Review of Systems  Constitutional:  Positive for fatigue.  HENT:  Positive for mouth dryness. Negative for mouth sores.   Eyes:  Positive for dryness.  Respiratory:  Positive for shortness of breath.   Cardiovascular:  Positive for chest pain and palpitations.  Gastrointestinal:  Positive for constipation, diarrhea and  nausea. Negative for blood in stool.  Endocrine: Negative for increased urination.  Genitourinary:  Positive for involuntary urination.  Musculoskeletal:  Positive for joint pain, gait problem, joint pain, joint swelling, myalgias, muscle weakness, morning stiffness, muscle tenderness and myalgias.  Skin:  Positive for color change. Negative for rash, hair loss and sensitivity to sunlight.  Allergic/Immunologic: Negative for susceptible to infections.  Neurological:  Positive for dizziness. Negative for headaches.  Hematological:  Negative for swollen glands.  Psychiatric/Behavioral:  Negative for depressed mood and sleep disturbance. The patient is nervous/anxious.     PMFS History:  Patient Active Problem List   Diagnosis Date Noted   Recurrent kidney stones 02/01/2024   Tophaceous gout 02/01/2024   Hypocalcemia 08/03/2023   Protein-calorie malnutrition, severe 08/03/2023   Hypokalemia 08/02/2023   Hypophosphatemia 08/02/2023   Abnormal LFTs (liver function tests) 08/02/2023   Macrocytic anemia 08/02/2023   Nodule of finger of both hands 08/02/2023   Tobacco use 05/24/2023   Acquired deformity of joint of finger of right hand 05/24/2023   Hypomagnesemia 01/21/2023   Malnutrition of moderate degree 09/16/2022   Alcohol use 09/12/2022   Major depressive disorder    Generalized anxiety disorder    History of heroin use    Crohn's disease - self reported. no biopsy proving Crohn's disease. small biopsy on 03-18-2003    Obstructive sleep apnea    Gout 04/09/2021   Dilated cbd, acquired 04/09/2021   Chronic pain syndrome 04/09/2021   Benign essential hypertension 04/09/2021   Alcohol dependence in sustained full remission     Past Medical History:  Diagnosis Date   Alcohol dependence in sustained full remission    Benign essential hypertension 04/09/2021   Chronic pain syndrome 04/09/2021   Crohn's disease - self reported. no biopsy to prove it    Dilated cbd, acquired  04/09/2021   Elevated troponin I level 09/12/2022   Gallstone pancreatitis 04/09/2021   Generalized anxiety disorder    Gout 04/09/2021   History of heroin use    Kidney stones    Major depressive disorder    Obstructive sleep apnea    Opiate withdrawal (HCC) - possible 09/12/2022    Family History  Problem Relation Age of Onset   Thyroid disease Mother    Dementia Paternal Grandmother    Past Surgical History:  Procedure Laterality Date   LEEP     LITHOTRIPSY     TUBAL LIGATION     Social History   Social History Narrative   Right handed   One story home   Caffeine yes   Immunization History  Administered Date(s) Administered   Influenza, Seasonal, Injecte, Preservative Fre 07/13/2023   Influenza,inj,Quad PF,6+ Mos 06/14/2021   PFIZER(Purple Top)SARS-COV-2 Vaccination 11/14/2019, 12/09/2019   Tdap 04/21/2021     Objective: Vital Signs: BP 136/82 (BP Location: Left Arm, Patient Position: Sitting, Cuff Size: Normal)   Pulse 77   Resp 17   Ht 5' 6 (1.676 m)   Wt 106 lb 6.4 oz (48.3  kg)   BMI 17.17 kg/m    Physical Exam Eyes:     Conjunctiva/sclera: Conjunctivae normal.  Cardiovascular:     Rate and Rhythm: Normal rate and regular rhythm.  Pulmonary:     Effort: Pulmonary effort is normal.     Breath sounds: Normal breath sounds.  Skin:    General: Skin is warm and dry.  Neurological:     Mental Status: She is alert.  Psychiatric:        Mood and Affect: Mood normal.      Musculoskeletal Exam:  Shoulders full ROM no tenderness or swelling Elbows full ROM no tenderness or swelling Wrists full ROM no tenderness or swelling Right index finger with solid, fixed, nontender nodule, thumb interphalangeal joint with palpable swelling on the dorsal side without any tenderness, left hand with mild distal Heberden's nodes Knees full ROM no tenderness or swelling Degenerative joint changes in toes on both feet, no palpable swelling    Investigation: No  additional findings.  Imaging: No results found.  Recent Labs: Lab Results  Component Value Date   WBC 4.9 08/05/2023   HGB 9.4 (L) 08/05/2023   PLT 269 08/05/2023   NA 138 02/01/2024   K 4.9 02/01/2024   CL 101 02/01/2024   CO2 26 02/01/2024   GLUCOSE 100 (H) 02/01/2024   BUN 21 02/01/2024   CREATININE 0.90 02/01/2024   BILITOT 0.3 02/01/2024   ALKPHOS 149 (H) 08/03/2023   AST 21 02/01/2024   ALT 12 02/01/2024   PROT 7.2 02/01/2024   ALBUMIN 2.7 (L) 08/03/2023   CALCIUM 9.7 02/01/2024   GFRAA  05/15/2008    >60        The eGFR has been calculated using the MDRD equation. This calculation has not been validated in all clinical    Speciality Comments: No specialty comments available.  Procedures:  No procedures performed Allergies: Penicillins   Assessment / Plan:     Visit Diagnoses: Other secondary gout of left ankle, unspecified chronicity Tophaceous gout - Plan: predniSONE  (DELTASONE ) 5 MG tablet, celecoxib  (CELEBREX ) 100 MG capsule Lab workup and x-ray were pretty nonspecific with uric acid at goal of less than 6 and no erosive disease or suspicious cystic changes.  May need a longer duration with uric acid at goal to see resolution but within normal sed rate also suggest more her chronic degenerative changes are the main contribution. - Continue allopurinol  200 mg daily - 12-day prednisone  taper starting from 20 mg daily - After completing baseline kidney and liver function test are fine for trying addition of Celebrex  100 mg 1-2 times daily as needed if she tolerates this, primary issues have been GI intolerance of medications and self-reported (?)Crohn's disease  Generalized osteoarthritis of multiple sites - Plan: predniSONE  (DELTASONE ) 5 MG tablet, celecoxib  (CELEBREX ) 100 MG capsule Osteoarthritis of multiple joints with joint effusions of hands, ankles, and feet Chronic osteoarthritis with bone spurring and joint effusions. Imaging shows bone spurring and  joint erosion. Blood tests indicate non-inflammatory arthritis.   Orders: No orders of the defined types were placed in this encounter.  Meds ordered this encounter  Medications   predniSONE  (DELTASONE ) 5 MG tablet    Sig: Take 4 tablets (20 mg total) by mouth daily with breakfast for 3 days, THEN 3 tablets (15 mg total) daily with breakfast for 3 days, THEN 2 tablets (10 mg total) daily with breakfast for 3 days, THEN 1 tablet (5 mg total) daily with breakfast for 3 days.  Dispense:  30 tablet    Refill:  0   celecoxib  (CELEBREX ) 100 MG capsule    Sig: Take 1 tablet by mouth with food 1 or 2 times daily as needed for arthritis    Dispense:  60 capsule    Refill:  2     Follow-Up Instructions: Return in about 10 weeks (around 06/12/2024) for Gout/OA on GC/NSAID f/u 2-72mos.   Lonni LELON Ester, MD  Note - This record has been created using [employer].  Chart creation errors have been sought, but may not always  have been located. Such creation errors do not reflect on  the standard of medical care.

## 2024-04-03 NOTE — Patient Instructions (Signed)
 Try taking the 12 day taper of oral prednisone  and see how much does this improve symptoms.  I also sent a prescription for celebrex , which you can take up to twice daily as needed for joint pain and swelling. This medication is best taken with food to minimize chance of stomach side effects.

## 2024-04-04 ENCOUNTER — Other Ambulatory Visit: Payer: Self-pay

## 2024-04-05 ENCOUNTER — Other Ambulatory Visit: Payer: Self-pay

## 2024-04-26 ENCOUNTER — Other Ambulatory Visit: Payer: Self-pay

## 2024-04-26 MED ORDER — BUPRENORPHINE HCL 8 MG SL SUBL
4.0000 mg | SUBLINGUAL_TABLET | Freq: Three times a day (TID) | SUBLINGUAL | 0 refills | Status: DC | PRN
  Filled 2024-04-26: qty 45, 30d supply, fill #0

## 2024-04-27 ENCOUNTER — Other Ambulatory Visit: Payer: Self-pay

## 2024-04-27 MED ORDER — TRAMADOL HCL 50 MG PO TABS
50.0000 mg | ORAL_TABLET | Freq: Three times a day (TID) | ORAL | 0 refills | Status: DC | PRN
  Filled 2024-04-27 – 2024-05-06 (×3): qty 180, 30d supply, fill #0

## 2024-04-29 ENCOUNTER — Other Ambulatory Visit: Payer: Self-pay

## 2024-05-02 ENCOUNTER — Other Ambulatory Visit: Payer: Self-pay

## 2024-05-02 ENCOUNTER — Other Ambulatory Visit (HOSPITAL_COMMUNITY): Payer: Self-pay

## 2024-05-02 MED ORDER — GABAPENTIN 100 MG PO CAPS
100.0000 mg | ORAL_CAPSULE | Freq: Two times a day (BID) | ORAL | 3 refills | Status: AC
  Filled 2024-05-02: qty 60, 30d supply, fill #0
  Filled 2024-06-04: qty 60, 30d supply, fill #1

## 2024-05-02 MED ORDER — CREON 36000-114000 UNITS PO CPEP
ORAL_CAPSULE | ORAL | 3 refills | Status: AC
  Filled 2024-05-02: qty 100, 34d supply, fill #0
  Filled 2024-06-04 (×2): qty 100, 34d supply, fill #1
  Filled 2024-08-27: qty 100, 34d supply, fill #2
  Filled 2024-08-27: qty 100, 34d supply, fill #0

## 2024-05-03 ENCOUNTER — Other Ambulatory Visit: Payer: Self-pay

## 2024-05-06 ENCOUNTER — Other Ambulatory Visit: Payer: Self-pay

## 2024-05-27 ENCOUNTER — Other Ambulatory Visit: Payer: Self-pay

## 2024-05-27 MED ORDER — TRAMADOL HCL 50 MG PO TABS
50.0000 mg | ORAL_TABLET | Freq: Three times a day (TID) | ORAL | 0 refills | Status: DC | PRN
  Filled 2024-05-27 – 2024-06-05 (×3): qty 180, 30d supply, fill #0

## 2024-05-27 MED ORDER — BUPRENORPHINE HCL 8 MG SL SUBL
4.0000 mg | SUBLINGUAL_TABLET | Freq: Three times a day (TID) | SUBLINGUAL | 0 refills | Status: DC | PRN
  Filled 2024-05-27 – 2024-06-05 (×3): qty 45, 30d supply, fill #0

## 2024-06-03 ENCOUNTER — Other Ambulatory Visit: Payer: Self-pay

## 2024-06-04 ENCOUNTER — Other Ambulatory Visit: Payer: Self-pay

## 2024-06-05 ENCOUNTER — Other Ambulatory Visit: Payer: Self-pay

## 2024-06-13 NOTE — Progress Notes (Deleted)
 Office Visit Note  Patient: [name]             Date of Birth: November 18, 1970           MRN: [id number]             [facility]P: [facility] Referring: [facility] Visit Date: 06/27/2024   Subjective:  No chief complaint on file.   History of Present Illness: [name] is a 53 y.o. female here for follow up with osteoarthritis who presents with joint pain and swelling.   Previous HPI 04/03/2024 [name] is a 53 year old female with osteoarthritis who presents with joint pain and swelling.   She experiences significant joint pain and swelling, particularly in her hands, feet, and ankles, which is severe enough to prevent her from putting full pressure on her feet and ankles.  She is ambulatory with a rolling walker.  Also continues to see swelling with a lot of joint pain and stiffness in multiple finger joints.   Lab test are initial visit looks good with a normal sedimentation rate of 2 and uric acid was at goal of 5.2 on the allopurinol  200 mg daily.   She manages her pain with tramadol , which provides relief but does not address swelling. She also takes BC powder daily.  She is not currently on any maintenance NSAID drug for joint inflammation. Her family history is notable for osteoarthritis, affecting her mother and sister. Her sister's condition was severe enough to necessitate a wheelchair due to spinal involvement.   She reports difficulty with daily activities such as cooking, relying on the microwave for meals due to her symptoms. She reports difficulty with very poor appetite, managed with Creon  which is partially effective.         Previous HPI 02/01/24 [name] is a 53 year old female with arthritis and gout who presents with chronic joint pain and swelling.   She has experienced arthritis symptoms for many years, with significant issues in her hands, feet, and ankles. 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 Episodes of severe pain have led to emergency room visits and the use of crutches and a cane. These symptoms began approximately six to seven years ago.   She experiences constant pain in her knees, ankles, and feet, with occasional swelling if she is on her feet too much. She uses a walker for mobility when necessary, especially for trips to a nearby store. Her hands also swell and are described as 'snap, crackle, pop.' She takes allopurinol , two tablets daily, and uses tramadol  as needed for pain flare-ups.   She reports a tingling sensation in her hip that started a few months ago, which is uncomfortable and painful but does not radiate. She also mentions a history of bulging discs in her lumbar spine, specifically L3, L4, and L5, and has had fluoroscopy spinal injections in the past, though she had a reaction to the steroid used.   Her family history includes arthritis, with her mother and sister also affected. Her sister was wheelchair-bound due to severe spinal arthritis. She has a history of kidney stones, which are calcium-based, and has undergone emergency procedures in the past to address ureteral blockages.   She has a digital cyst on her thumb that has required drainage in the past, with a thick, chalky white substance noted upon drainage. No numbness in fingers or toes but  tingling in the hip.   Uric acid 07/2023 5.7 05/2023 13.6   Imaging reviewed 08/04/23 Xray Left hand IMPRESSION: 1. Moderate third DIP osteoarthritis. 2. Mild-to-moderate thumb interphalangeal and second, fourth, and fifth DIP osteoarthritis.   No Rheumatology ROS completed.   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 no biopsy proving Crohn's disease. small biopsy on 03-18-2003    Obstructive sleep apnea    Gout 04/09/2021   Dilated cbd, acquired 04/09/2021   Chronic pain syndrome 04/09/2021   Benign essential hypertension 04/09/2021   Alcohol dependence in sustained full remission     Past Medical History:  Diagnosis Date   Alcohol dependence in sustained full remission    Benign essential hypertension 04/09/2021   Chronic pain syndrome 04/09/2021   Crohn's disease - self reported. no biopsy to prove it    Dilated cbd, acquired 04/09/2021   Elevated troponin I level 09/12/2022   Gallstone pancreatitis 04/09/2021   Generalized anxiety disorder    Gout 04/09/2021   History of heroin use    Kidney stones    Major depressive disorder    Obstructive sleep apnea    Opiate withdrawal (HCC) - possible 09/12/2022    Family History  Problem Relation Age of Onset   Thyroid disease Mother    Dementia Paternal Grandmother    Past Surgical History:  Procedure Laterality Date   LEEP     LITHOTRIPSY     TUBAL LIGATION     Social History   Social History Narrative   Right handed   One story home   Caffeine yes   Immunization History  Administered Date(s) Administered   Influenza, Seasonal, Injecte, Preservative Fre 07/13/2023   Influenza,inj,Quad PF,6+ Mos 06/14/2021   PFIZER(Purple Top)SARS-COV-2 Vaccination 11/14/2019, 12/09/2019   Tdap 04/21/2021     Objective: Vital Signs: There were no vitals taken for this visit.   Physical Exam   Musculoskeletal Exam:  ***  CDAI Exam: CDAI Score: -- Patient Global: --; Provider Global: -- Swollen: --; Tender: -- Joint Exam 06/27/2024   No joint exam has been documented for this visit   There is currently no information documented on the homunculus. Go to the Rheumatology activity and complete the homunculus joint exam.  Investigation: No additional findings.  Imaging: No results found.  Recent Labs: Lab Results  Component Value Date   WBC 4.9 08/05/2023   HGB 9.4 (L) 08/05/2023   PLT 269 08/05/2023   NA 138 02/01/2024   K 4.9 02/01/2024   CL 101 02/01/2024   CO2 26 02/01/2024   GLUCOSE 100 (H) 02/01/2024   BUN 21 02/01/2024   CREATININE 0.90 02/01/2024   BILITOT 0.3 02/01/2024   ALKPHOS 149 (H) 08/03/2023   AST 21 02/01/2024   ALT 12 02/01/2024   PROT 7.2 02/01/2024   ALBUMIN 2.7 (L)  08/03/2023   CALCIUM 9.7 02/01/2024   GFRAA  05/15/2008    >60        The eGFR has been calculated using the MDRD equation. This calculation has not been validated in all clinical    Speciality Comments: No specialty comments available.  Procedures:  No procedures performed Allergies: Penicillins   Assessment / Plan:     Visit Diagnoses: No diagnosis found.  ***  Orders: No orders of the defined types were placed in this encounter.  No orders of the defined types were placed in this encounter.    Follow-Up Instructions: No follow-ups on file.   Abigael Mogle M Quinterious Walraven, CMA  Note - This record has been created using [employer].  Chart creation errors have been sought, but may not always  have been located. Such creation errors do not reflect on  the standard of medical care.

## 2024-06-21 ENCOUNTER — Other Ambulatory Visit: Payer: Self-pay

## 2024-06-21 MED ORDER — PRAVASTATIN SODIUM 20 MG PO TABS: 20.0000 mg | ORAL_TABLET | Freq: Every day | ORAL | 2 refills | Status: AC

## 2024-06-24 ENCOUNTER — Other Ambulatory Visit: Payer: Self-pay

## 2024-06-24 MED ORDER — TRAMADOL HCL 50 MG PO TABS: 50.0000 mg | ORAL_TABLET | Freq: Three times a day (TID) | ORAL | 0 refills | Status: DC

## 2024-06-24 MED ORDER — BUPRENORPHINE HCL 8 MG SL SUBL: 4.0000 mg | SUBLINGUAL_TABLET | Freq: Three times a day (TID) | SUBLINGUAL | 0 refills | Status: DC

## 2024-06-27 ENCOUNTER — Ambulatory Visit: Payer: MEDICAID | Admitting: Internal Medicine

## 2024-06-27 DIAGNOSIS — M1A9XX1 Chronic gout, unspecified, with tophus (tophi): Secondary | ICD-10-CM

## 2024-06-27 DIAGNOSIS — M10472 Other secondary gout, left ankle and foot: Secondary | ICD-10-CM

## 2024-06-27 DIAGNOSIS — M159 Polyosteoarthritis, unspecified: Secondary | ICD-10-CM

## 2024-06-30 ENCOUNTER — Other Ambulatory Visit: Payer: Self-pay

## 2024-06-30 ENCOUNTER — Emergency Department (HOSPITAL_COMMUNITY): Payer: MEDICAID

## 2024-06-30 ENCOUNTER — Encounter (HOSPITAL_COMMUNITY): Payer: Self-pay

## 2024-06-30 ENCOUNTER — Inpatient Hospital Stay (HOSPITAL_COMMUNITY)
Admission: EM | Admit: 2024-06-30 | Discharge: 2024-07-06 | DRG: 372 | Disposition: A | Discharge status: Home / Self Care | Payer: MEDICAID | Attending: Internal Medicine | Admitting: Internal Medicine

## 2024-06-30 DIAGNOSIS — Z8719 Personal history of other diseases of the digestive system: Secondary | ICD-10-CM

## 2024-06-30 DIAGNOSIS — M159 Polyosteoarthritis, unspecified: Secondary | ICD-10-CM

## 2024-06-30 DIAGNOSIS — F1191 Opioid use, unspecified, in remission: Secondary | ICD-10-CM | POA: Diagnosis present

## 2024-06-30 DIAGNOSIS — K838 Other specified diseases of biliary tract: Secondary | ICD-10-CM | POA: Diagnosis present

## 2024-06-30 DIAGNOSIS — Z87442 Personal history of urinary calculi: Secondary | ICD-10-CM

## 2024-06-30 DIAGNOSIS — M1A9XX1 Chronic gout, unspecified, with tophus (tophi): Secondary | ICD-10-CM | POA: Diagnosis present

## 2024-06-30 DIAGNOSIS — F1021 Alcohol dependence, in remission: Secondary | ICD-10-CM | POA: Diagnosis present

## 2024-06-30 DIAGNOSIS — Z720 Tobacco use: Secondary | ICD-10-CM | POA: Diagnosis present

## 2024-06-30 DIAGNOSIS — A0472 Enterocolitis due to Clostridium difficile, not specified as recurrent: Principal | ICD-10-CM | POA: Diagnosis present

## 2024-06-30 DIAGNOSIS — M79644 Pain in right finger(s): Secondary | ICD-10-CM | POA: Diagnosis present

## 2024-06-30 DIAGNOSIS — R9431 Abnormal electrocardiogram [ECG] [EKG]: Secondary | ICD-10-CM | POA: Diagnosis present

## 2024-06-30 DIAGNOSIS — K529 Noninfective gastroenteritis and colitis, unspecified: Principal | ICD-10-CM

## 2024-06-30 DIAGNOSIS — F1120 Opioid dependence, uncomplicated: Secondary | ICD-10-CM | POA: Diagnosis present

## 2024-06-30 DIAGNOSIS — F17210 Nicotine dependence, cigarettes, uncomplicated: Secondary | ICD-10-CM | POA: Diagnosis present

## 2024-06-30 DIAGNOSIS — Z23 Encounter for immunization: Secondary | ICD-10-CM

## 2024-06-30 DIAGNOSIS — M109 Gout, unspecified: Secondary | ICD-10-CM

## 2024-06-30 DIAGNOSIS — Z79899 Other long term (current) drug therapy: Secondary | ICD-10-CM

## 2024-06-30 DIAGNOSIS — Z880 Allergy status to penicillin: Secondary | ICD-10-CM

## 2024-06-30 DIAGNOSIS — F1090 Alcohol use, unspecified, uncomplicated: Secondary | ICD-10-CM | POA: Diagnosis present

## 2024-06-30 DIAGNOSIS — R160 Hepatomegaly, not elsewhere classified: Secondary | ICD-10-CM | POA: Diagnosis present

## 2024-06-30 DIAGNOSIS — K8689 Other specified diseases of pancreas: Secondary | ICD-10-CM | POA: Diagnosis present

## 2024-06-30 DIAGNOSIS — Z8349 Family history of other endocrine, nutritional and metabolic diseases: Secondary | ICD-10-CM

## 2024-06-30 DIAGNOSIS — F411 Generalized anxiety disorder: Secondary | ICD-10-CM | POA: Diagnosis present

## 2024-06-30 DIAGNOSIS — R7989 Other specified abnormal findings of blood chemistry: Secondary | ICD-10-CM | POA: Diagnosis present

## 2024-06-30 DIAGNOSIS — K760 Fatty (change of) liver, not elsewhere classified: Secondary | ICD-10-CM | POA: Diagnosis present

## 2024-06-30 DIAGNOSIS — G894 Chronic pain syndrome: Secondary | ICD-10-CM | POA: Diagnosis present

## 2024-06-30 DIAGNOSIS — M19049 Primary osteoarthritis, unspecified hand: Secondary | ICD-10-CM | POA: Diagnosis present

## 2024-06-30 DIAGNOSIS — I10 Essential (primary) hypertension: Secondary | ICD-10-CM | POA: Diagnosis present

## 2024-06-30 DIAGNOSIS — Z716 Tobacco abuse counseling: Secondary | ICD-10-CM

## 2024-06-30 DIAGNOSIS — E876 Hypokalemia: Secondary | ICD-10-CM | POA: Diagnosis present

## 2024-06-30 DIAGNOSIS — M10472 Other secondary gout, left ankle and foot: Secondary | ICD-10-CM

## 2024-06-30 HISTORY — DX: Noninfective gastroenteritis and colitis, unspecified: K52.9

## 2024-06-30 LAB — URINALYSIS, ROUTINE W REFLEX MICROSCOPIC
Bilirubin Urine: NEGATIVE
Glucose, UA: NEGATIVE mg/dL
Hgb urine dipstick: NEGATIVE
Ketones, ur: NEGATIVE mg/dL
Leukocytes,Ua: NEGATIVE
Nitrite: NEGATIVE
Protein, ur: NEGATIVE mg/dL
Specific Gravity, Urine: 1.034 — ABNORMAL HIGH (ref 1.005–1.030)
pH: 7 (ref 5.0–8.0)

## 2024-06-30 LAB — COMPREHENSIVE METABOLIC PANEL WITH GFR
ALT: 56 U/L — ABNORMAL HIGH (ref 0–44)
AST: 129 U/L — ABNORMAL HIGH (ref 15–41)
Albumin: 3.6 g/dL (ref 3.5–5.0)
Alkaline Phosphatase: 166 U/L — ABNORMAL HIGH (ref 38–126)
Anion gap: 17 — ABNORMAL HIGH (ref 5–15)
BUN: 5 mg/dL — ABNORMAL LOW (ref 6–20)
CO2: 25 mmol/L (ref 22–32)
Calcium: 8.2 mg/dL — ABNORMAL LOW (ref 8.9–10.3)
Chloride: 97 mmol/L — ABNORMAL LOW (ref 98–111)
Creatinine, Ser: 0.91 mg/dL (ref 0.44–1.00)
GFR, Estimated: >60 mL/min (ref >60)
Glucose, Bld: 147 mg/dL — ABNORMAL HIGH (ref 70–99)
Potassium: 2.7 mmol/L — CL (ref 3.5–5.1)
Sodium: 138 mmol/L (ref 135–145)
Total Bilirubin: 0.8 mg/dL (ref 0.0–1.2)
Total Protein: 6.2 g/dL — ABNORMAL LOW (ref 6.5–8.1)

## 2024-06-30 LAB — LACTIC ACID, PLASMA
Lactic Acid, Venous: 1.6 mmol/L (ref 0.5–1.9)
Lactic Acid, Venous: 1.2 mmol/L (ref 0.5–1.9)

## 2024-06-30 LAB — BASIC METABOLIC PANEL WITH GFR
Anion gap: 11 (ref 5–15)
BUN: <5 mg/dL — ABNORMAL LOW (ref 6–20)
CO2: 26 mmol/L (ref 22–32)
Calcium: 7.7 mg/dL — ABNORMAL LOW (ref 8.9–10.3)
Chloride: 102 mmol/L (ref 98–111)
Creatinine, Ser: 0.82 mg/dL (ref 0.44–1.00)
GFR, Estimated: >60 mL/min (ref >60)
Glucose, Bld: 119 mg/dL — ABNORMAL HIGH (ref 70–99)
Potassium: 3 mmol/L — ABNORMAL LOW (ref 3.5–5.1)
Sodium: 138 mmol/L (ref 135–145)

## 2024-06-30 LAB — I-STAT CHEM 8, ED
BUN: 3 mg/dL — ABNORMAL LOW (ref 6–20)
Calcium, Ion: 0.96 mmol/L — ABNORMAL LOW (ref 1.15–1.40)
Chloride: 96 mmol/L — ABNORMAL LOW (ref 98–111)
Creatinine, Ser: 0.9 mg/dL (ref 0.44–1.00)
Glucose, Bld: 146 mg/dL — ABNORMAL HIGH (ref 70–99)
HCT: 38 % (ref 36.0–46.0)
Hemoglobin: 12.9 g/dL (ref 12.0–15.0)
Potassium: 2.6 mmol/L — CL (ref 3.5–5.1)
Sodium: 138 mmol/L (ref 135–145)
TCO2: 26 mmol/L (ref 22–32)

## 2024-06-30 LAB — CBC WITH DIFFERENTIAL/PLATELET
Abs Immature Granulocytes: 0.02 K/uL (ref 0.00–0.07)
Basophils Absolute: 0 K/uL (ref 0.0–0.1)
Basophils Relative: 0 %
Eosinophils Absolute: 0 K/uL (ref 0.0–0.5)
Eosinophils Relative: 0 %
HCT: 35.1 % — ABNORMAL LOW (ref 36.0–46.0)
Hemoglobin: 12 g/dL (ref 12.0–15.0)
Immature Granulocytes: 0 %
Lymphocytes Relative: 7 %
Lymphs Abs: 0.8 K/uL (ref 0.7–4.0)
MCH: 34.7 pg — ABNORMAL HIGH (ref 26.0–34.0)
MCHC: 34.2 g/dL (ref 30.0–36.0)
MCV: 101.4 fL — ABNORMAL HIGH (ref 80.0–100.0)
Monocytes Absolute: 0.4 K/uL (ref 0.1–1.0)
Monocytes Relative: 3 %
Neutro Abs: 10.2 K/uL — ABNORMAL HIGH (ref 1.7–7.7)
Neutrophils Relative %: 90 %
Platelets: 268 K/uL (ref 150–400)
RBC: 3.46 MIL/uL — ABNORMAL LOW (ref 3.87–5.11)
RDW: 13.2 % (ref 11.5–15.5)
WBC: 11.5 K/uL — ABNORMAL HIGH (ref 4.0–10.5)
nRBC: 0 % (ref 0.0–0.2)

## 2024-06-30 LAB — URINE DRUG SCREEN
Amphetamines: NEGATIVE
Barbiturates: NEGATIVE
Benzodiazepines: NEGATIVE
Cocaine: NEGATIVE
Fentanyl: NEGATIVE
Methadone Scn, Ur: NEGATIVE
Opiates: POSITIVE — AB
Tetrahydrocannabinol: NEGATIVE

## 2024-06-30 LAB — ETHANOL: Alcohol, Ethyl (B): <15 mg/dL (ref <15)

## 2024-06-30 LAB — SEDIMENTATION RATE: Sed Rate: 3 mm/h (ref 0–22)

## 2024-06-30 LAB — PROTIME-INR
INR: 1 (ref 0.8–1.2)
Prothrombin Time: 13.9 s (ref 11.4–15.2)

## 2024-06-30 LAB — TROPONIN T, HIGH SENSITIVITY
Troponin T High Sensitivity: 17 ng/L (ref 0–19)
Troponin T High Sensitivity: 18 ng/L (ref 0–19)

## 2024-06-30 LAB — URIC ACID: Uric Acid, Serum: 13.2 mg/dL — ABNORMAL HIGH (ref 2.5–7.1)

## 2024-06-30 LAB — C-REACTIVE PROTEIN: CRP: <0.5 mg/dL (ref <1.0)

## 2024-06-30 LAB — MAGNESIUM: Magnesium: 1 mg/dL — ABNORMAL LOW (ref 1.7–2.4)

## 2024-06-30 LAB — LIPASE, BLOOD: Lipase: 18 U/L (ref 11–51)

## 2024-06-30 LAB — PROCALCITONIN: Procalcitonin: <0.1 ng/mL

## 2024-06-30 MED ORDER — ALBUTEROL SULFATE HFA 108 (90 BASE) MCG/ACT IN AERS: 2.0000 | INHALATION_SPRAY | Freq: Four times a day (QID) | RESPIRATORY_TRACT | Status: DC | PRN

## 2024-06-30 MED ORDER — SODIUM CHLORIDE 0.9 % IV SOLN: INTRAVENOUS | Status: AC

## 2024-06-30 MED ORDER — NICOTINE 21 MG/24HR TD PT24
21.0000 mg | MEDICATED_PATCH | Freq: Every evening | TRANSDERMAL | Status: DC
  Administered 2024-06-30 – 2024-07-05 (×6): 21 mg via TRANSDERMAL
  Filled 2024-06-30 (×6): qty 1

## 2024-06-30 MED ORDER — MAGNESIUM SULFATE IN D5W 1-5 GM/100ML-% IV SOLN
1.0000 g | Freq: Once | INTRAVENOUS | Status: AC
  Administered 2024-06-30: 1 g via INTRAVENOUS
  Filled 2024-06-30: qty 100

## 2024-06-30 MED ORDER — SODIUM CHLORIDE 0.9 % IV SOLN
2.0000 g | Freq: Once | INTRAVENOUS | Status: AC
  Administered 2024-06-30: 2 g via INTRAVENOUS
  Filled 2024-06-30: qty 12.5

## 2024-06-30 MED ORDER — ENOXAPARIN SODIUM 40 MG/0.4ML IJ SOSY
40.0000 mg | PREFILLED_SYRINGE | INTRAMUSCULAR | Status: DC
  Administered 2024-06-30 – 2024-07-05 (×6): 40 mg via SUBCUTANEOUS
  Filled 2024-06-30 (×6): qty 0.4

## 2024-06-30 MED ORDER — LORAZEPAM 1 MG PO TABS
1.0000 mg | ORAL_TABLET | ORAL | Status: AC | PRN
  Administered 2024-07-02 – 2024-07-03 (×4): 1 mg via ORAL
  Administered 2024-07-03: 2 mg via ORAL
  Administered 2024-07-03: 1 mg via ORAL
  Filled 2024-06-30 (×2): qty 1
  Filled 2024-06-30: qty 2
  Filled 2024-06-30: qty 1
  Filled 2024-06-30: qty 2
  Filled 2024-06-30: qty 1

## 2024-06-30 MED ORDER — ALBUTEROL SULFATE (2.5 MG/3ML) 0.083% IN NEBU: 2.5000 mg | INHALATION_SOLUTION | Freq: Four times a day (QID) | RESPIRATORY_TRACT | Status: DC | PRN

## 2024-06-30 MED ORDER — AMLODIPINE BESYLATE 5 MG PO TABS
5.0000 mg | ORAL_TABLET | Freq: Every day | ORAL | Status: DC
  Administered 2024-06-30 – 2024-07-05 (×6): 5 mg via ORAL
  Filled 2024-06-30 (×6): qty 1

## 2024-06-30 MED ORDER — IOHEXOL 300 MG/ML  SOLN
100.0000 mL | Freq: Once | INTRAMUSCULAR | Status: AC | PRN
  Administered 2024-06-30: 80 mL via INTRAVENOUS

## 2024-06-30 MED ORDER — THIAMINE HCL 100 MG/ML IJ SOLN
100.0000 mg | Freq: Every day | INTRAMUSCULAR | Status: DC
  Administered 2024-06-30 – 2024-07-03 (×3): 100 mg via INTRAVENOUS
  Filled 2024-06-30 (×3): qty 2

## 2024-06-30 MED ORDER — LISINOPRIL 20 MG PO TABS
20.0000 mg | ORAL_TABLET | Freq: Every day | ORAL | Status: DC
  Administered 2024-06-30 – 2024-07-05 (×6): 20 mg via ORAL
  Filled 2024-06-30 (×6): qty 1

## 2024-06-30 MED ORDER — ADULT MULTIVITAMIN W/MINERALS CH
1.0000 | ORAL_TABLET | Freq: Every day | ORAL | Status: DC
  Administered 2024-06-30 – 2024-07-06 (×7): 1 via ORAL
  Filled 2024-06-30 (×7): qty 1

## 2024-06-30 MED ORDER — FOLIC ACID 1 MG PO TABS
1.0000 mg | ORAL_TABLET | Freq: Every day | ORAL | Status: DC
  Administered 2024-06-30 – 2024-07-06 (×7): 1 mg via ORAL
  Filled 2024-06-30 (×7): qty 1

## 2024-06-30 MED ORDER — ACETAMINOPHEN 325 MG PO TABS: 650.0000 mg | ORAL_TABLET | Freq: Four times a day (QID) | ORAL | Status: DC | PRN

## 2024-06-30 MED ORDER — HYDROMORPHONE HCL 1 MG/ML IJ SOLN
1.0000 mg | Freq: Once | INTRAMUSCULAR | Status: AC
Start: 2024-06-30 — End: 2024-06-30
  Administered 2024-06-30: 1 mg via INTRAVENOUS
  Filled 2024-06-30: qty 1

## 2024-06-30 MED ORDER — COLCHICINE 0.6 MG PO TABS
0.6000 mg | ORAL_TABLET | Freq: Two times a day (BID) | ORAL | Status: DC
  Administered 2024-06-30 – 2024-07-06 (×12): 0.6 mg via ORAL
  Filled 2024-06-30 (×12): qty 1

## 2024-06-30 MED ORDER — METRONIDAZOLE 500 MG/100ML IV SOLN
500.0000 mg | Freq: Once | INTRAVENOUS | Status: AC
  Administered 2024-06-30: 500 mg via INTRAVENOUS
  Filled 2024-06-30: qty 100

## 2024-06-30 MED ORDER — ACETAMINOPHEN 650 MG RE SUPP: 650.0000 mg | Freq: Four times a day (QID) | RECTAL | Status: DC | PRN

## 2024-06-30 MED ORDER — POTASSIUM CHLORIDE 10 MEQ/100ML IV SOLN
10.0000 meq | Freq: Once | INTRAVENOUS | Status: AC
  Administered 2024-06-30: 10 meq via INTRAVENOUS
  Filled 2024-06-30: qty 100

## 2024-06-30 MED ORDER — LOPERAMIDE HCL 2 MG PO CAPS
4.0000 mg | ORAL_CAPSULE | Freq: Once | ORAL | Status: AC
  Administered 2024-06-30: 4 mg via ORAL
  Filled 2024-06-30: qty 2

## 2024-06-30 MED ORDER — THIAMINE MONONITRATE 100 MG PO TABS
100.0000 mg | ORAL_TABLET | Freq: Every day | ORAL | Status: DC
  Administered 2024-07-01 – 2024-07-06 (×4): 100 mg via ORAL
  Filled 2024-06-30 (×5): qty 1

## 2024-06-30 MED ORDER — PREDNISONE 20 MG PO TABS
20.0000 mg | ORAL_TABLET | Freq: Every day | ORAL | Status: DC
  Administered 2024-06-30 – 2024-07-06 (×6): 20 mg via ORAL
  Filled 2024-06-30 (×7): qty 1

## 2024-06-30 MED ORDER — METOPROLOL TARTRATE 5 MG/5ML IV SOLN: 5.0000 mg | Freq: Four times a day (QID) | INTRAVENOUS | Status: DC | PRN

## 2024-06-30 MED ORDER — GABAPENTIN 100 MG PO CAPS
100.0000 mg | ORAL_CAPSULE | Freq: Two times a day (BID) | ORAL | Status: DC
  Administered 2024-06-30 – 2024-07-06 (×12): 100 mg via ORAL
  Filled 2024-06-30 (×12): qty 1

## 2024-06-30 MED ORDER — POTASSIUM CHLORIDE CRYS ER 20 MEQ PO TBCR
40.0000 meq | EXTENDED_RELEASE_TABLET | Freq: Once | ORAL | Status: AC
  Administered 2024-06-30: 40 meq via ORAL
  Filled 2024-06-30: qty 2

## 2024-06-30 MED ORDER — MORPHINE SULFATE (PF) 2 MG/ML IV SOLN
2.0000 mg | INTRAVENOUS | Status: DC | PRN
  Administered 2024-06-30: 2 mg via INTRAVENOUS
  Filled 2024-06-30: qty 1

## 2024-06-30 MED ORDER — MORPHINE SULFATE (PF) 4 MG/ML IV SOLN
4.0000 mg | Freq: Once | INTRAVENOUS | Status: AC
  Administered 2024-06-30: 4 mg via INTRAVENOUS
  Filled 2024-06-30: qty 1

## 2024-06-30 MED ORDER — PANCRELIPASE (LIP-PROT-AMYL) 36000-114000 UNITS PO CPEP
36000.0000 [IU] | ORAL_CAPSULE | Freq: Three times a day (TID) | ORAL | Status: DC
  Administered 2024-07-01 – 2024-07-06 (×17): 36000 [IU] via ORAL
  Filled 2024-06-30 (×17): qty 1

## 2024-06-30 MED ORDER — BUPRENORPHINE HCL 2 MG SL SUBL
4.0000 mg | SUBLINGUAL_TABLET | Freq: Every day | SUBLINGUAL | Status: DC
  Administered 2024-07-01 – 2024-07-06 (×6): 4 mg via SUBLINGUAL
  Filled 2024-06-30 (×6): qty 2

## 2024-06-30 MED ORDER — ONDANSETRON HCL 4 MG/2ML IJ SOLN
4.0000 mg | Freq: Once | INTRAMUSCULAR | Status: AC
  Administered 2024-06-30: 4 mg via INTRAVENOUS
  Filled 2024-06-30: qty 2

## 2024-06-30 MED ORDER — LORAZEPAM 2 MG/ML IJ SOLN
1.0000 mg | INTRAMUSCULAR | Status: AC | PRN
  Administered 2024-06-30 – 2024-07-02 (×4): 1 mg via INTRAVENOUS
  Filled 2024-06-30 (×5): qty 1

## 2024-06-30 NOTE — Assessment & Plan Note (Signed)
 Continue subutex , gabapentin  and cymbalta PMP verified PRN morphine  for severe pain for the gout

## 2024-06-30 NOTE — Assessment & Plan Note (Signed)
 Off her blood pressure medication for unknown amount of time  Start back norvasc  5mg  and lisinopril  20mg  daily  PRN metoprolol pending UDS negative for cocaine

## 2024-06-30 NOTE — Assessment & Plan Note (Addendum)
 Has been off her allopurinol  for unknown amount of time with alcohol use No SIRS criteria and normal lactic acid Uric acid, inflammatory markers pending Seen by rheumatology Quite edematous and painful Start prednisone  and colchicine  Due to chronicity may need surgical intervention, but not during acute flare. Consider hand consult

## 2024-06-30 NOTE — Assessment & Plan Note (Deleted)
-  53 year old presenting to ED with acute on chronic diarrhea and vomiting and inability to keep anything down with poor PO intake x 1 week found to have diffuse pancolitis on CT imaging  -no SIRS criteria met, denies any fevers at home  -followed at [facility], Dr. Violeta. Can not see notes, but she states she had EGD and colitis either early this year or last year. She states her colonoscopy showed benign polyps and she can't remember EGD results  -check PCT/inflammatory markers -received abx in ED, but continue conservative therapy for now -stool studies pending -continue enteric precautions  -clear liquid diet for now  -continue creon   -consider GI consult in patient

## 2024-06-30 NOTE — Assessment & Plan Note (Addendum)
 Replete as needed

## 2024-06-30 NOTE — Assessment & Plan Note (Addendum)
 History of elevated LFTS (AST>ALT) appears to be alcohol related trend Admits to drinking 4x or more a week  Severe hepatic steatosis with hepatomegaly  Hepatitis panel negative Seen by general surgery with negative HIDA and no gallstones July 2025

## 2024-06-30 NOTE — ED Notes (Signed)
 Called for channels and pump for meds

## 2024-06-30 NOTE — Assessment & Plan Note (Signed)
 Smoking 1PPD Nicotine  patch Encouraged cessation

## 2024-06-30 NOTE — Assessment & Plan Note (Signed)
 Drinking at least 4x/week Vague about quantity No hx of withdrawals CIWA protocol Mv, thiamine  and folic acid 

## 2024-06-30 NOTE — Assessment & Plan Note (Signed)
-  chronic, no change -negative HIDA per surgery note 02/2024

## 2024-06-30 NOTE — ED Provider Notes (Signed)
 [place] EMERGENCY DEPARTMENT AT [facility] Provider Note   CSN: [id number] Arrival date & time: 06/30/24  1050     Patient presents with: Hand Pain and Nausea   [name] is a 53 y.o. female who presents to the ED today with primary concerns of pain and swelling in the thumb and forefinger of the right hand as well as lower back pain that she describes as pressure sore on the lower aspect of her back and buttocks.  She also endorses increased nausea decreased oral intake, increased generalized weakness and fatigue.  At baseline she ambulates with a cane.  Noted previous medical diagnoses of essential hypertension, gallstone pancreatitis, gout, Crohn's disease without specific diagnostic criteria, GAD.  Noted to be on Subutex  for chronic pain, also taking tramadol  for chronic pain.  Further endorses having persistent diarrhea over the last 24 to 48 hours.    Hand Pain       Prior to Admission medications   Medication Sig Start Date End Date Taking? Authorizing Provider  albuterol  (VENTOLIN  HFA) 108 (90 Base) MCG/ACT inhaler Inhale 2 puffs into the lungs 2 (two) times daily. Patient taking differently: Inhale 2 puffs into the lungs as needed. 07/26/23  Yes   buprenorphine  (SUBUTEX ) 8 MG SUBL SL tablet Place 0.5 tablets (4 mg total) under the tongue 2 (two) times daily. Patient taking differently: Place 4 mg under the tongue daily. 03/28/24  Yes   celecoxib  (CELEBREX ) 100 MG capsule Take 1 tablet by mouth with food 1 or 2 times daily as needed for arthritis 04/03/24  Yes Rice, Lonni ORN, MD  ferrous sulfate 325 (65 FE) MG EC tablet Take 325 mg by mouth daily.   Yes [provider]  gabapentin  (NEURONTIN ) 100 MG capsule Take 1 capsule (100 mg total) by mouth 2 (two) times daily. 05/02/24  Yes   lipase/protease/amylase (CREON ) 36000 UNITS CPEP capsule Take 2 capsules by mouth with a meals and 1 capsule by mouth with snacks. 12/20/23  Yes   loperamide  (IMODIUM ) 2  MG capsule Take 2 capsules (4 mg total) by mouth every 6 (six) hours as needed for diarrhea or loose stools. 08/05/23  Yes Pokhrel, Laxman, MD  ondansetron  (ZOFRAN -ODT) 4 MG disintegrating tablet Dissolve 1 tablet (4 mg total) by mouth every 8 (eight) hours as needed for nausea or vomiting for up to 90 days 03/14/24  Yes   pravastatin  (PRAVACHOL ) 20 MG tablet Take 1 tablet (20 mg total) by mouth daily. 03/18/24  Yes   traMADol  (ULTRAM ) 50 MG tablet Take 1-2 tablets (50-100 mg total) by mouth every 8 (eight) hours as needed. 11/30/23  Yes   allopurinol  (ZYLOPRIM ) 100 MG tablet Take 2 tablets (200 mg total) by mouth daily. 11/30/23     amLODipine  (NORVASC ) 5 MG tablet Take 1 tablet (5 mg total) by mouth daily. 11/08/23   Jerrye Katheryn BROCKS, MD  buprenorphine  (SUBUTEX ) 8 MG SUBL SL tablet Place 0.5 tablets (4 mg total) under the tongue 3 (three) times daily as needed. Patient not taking: Reported on 06/30/2024 04/26/24     buprenorphine  (SUBUTEX ) 8 MG SUBL SL tablet Place 0.5 tablets (4 mg total) under the tongue 3 (three) times daily as needed. Patient not taking: Reported on 06/30/2024 05/24/24     buprenorphine  (SUBUTEX ) 8 MG SUBL SL tablet Place 0.5 tablets (4 mg total) under the tongue 3 (three) times daily as needed Patient not taking: Reported on 06/30/2024 06/24/24     lipase/protease/amylase (CREON ) 36000 UNITS  CPEP capsule Take  2 capsules with a meal 1 capsule with a snack. Patient not taking: Reported on 06/30/2024 05/02/24     lisinopril  (ZESTRIL ) 20 MG tablet Take 20 mg by mouth daily.    [provider]  Na Sulfate-K Sulfate-Mg Sulfate concentrate (SUPREP BOWEL PREP KIT) 17.5-3.13-1.6 GM/177ML SOLN Use as directed Patient not taking: No sig reported 09/27/23     pravastatin  (PRAVACHOL ) 20 MG tablet Take 1 tablet (20 mg total) by mouth daily. Patient not taking: No sig reported 03/04/24     pravastatin  (PRAVACHOL ) 20 MG tablet Take 1 tablet (20 mg total) by mouth daily. Patient not taking:  Reported on 06/30/2024 06/20/24     traMADol  (ULTRAM ) 50 MG tablet Take 1 to 2 tablet(s) by mouth every eight hours as needed Patient not taking: No sig reported 12/28/23     traMADol  (ULTRAM ) 50 MG tablet Take 1 to 2 tablets (100 mg total) by mouth every 8 (eight) hours as needed. Patient not taking: No sig reported 01/29/24       Allergies: Penicillins    Review of Systems  Constitutional:  Positive for activity change, appetite change and fatigue.  Musculoskeletal:  Positive for arthralgias and joint swelling.  Skin:  Positive for wound.  All other systems reviewed and are negative.   Updated Vital Signs BP (!) 185/108 (BP Location: Left Arm)   Pulse 90   Temp 99.3 F (37.4 C) (Oral)   Resp 17   Ht 5' 6 (1.676 m)   Wt 47.6 kg   SpO2 98%   BMI 16.95 kg/m   Physical Exam Vitals and nursing note reviewed.  Constitutional:      General: She is awake. She is not in acute distress.    Appearance: Normal appearance. She is well-developed and normal weight. She is ill-appearing.  HENT:     Head: Normocephalic and atraumatic.     Mouth/Throat:     Mouth: Mucous membranes are moist.     Pharynx: Oropharynx is clear.  Eyes:     Extraocular Movements: Extraocular movements intact.     Conjunctiva/sclera: Conjunctivae normal.     Pupils: Pupils are equal, round, and reactive to light.  Cardiovascular:     Rate and Rhythm: Normal rate and regular rhythm.     Pulses: Normal pulses.     Heart sounds: Normal heart sounds. No murmur heard.    No friction rub. No gallop.  Pulmonary:     Effort: Pulmonary effort is normal.     Breath sounds: Normal breath sounds.  Abdominal:     General: Abdomen is flat. Bowel sounds are normal.     Palpations: Abdomen is soft.  Musculoskeletal:        General: Normal range of motion.     Right hand: Swelling, tenderness and bony tenderness present.     Left hand: Normal.     Cervical back: Normal range of motion and neck supple.     Right lower  leg: No edema.     Left lower leg: No edema.     Comments: On the DIP joints of the right thumb and first digit there is notable erythema and swelling, areas exquisitely tender.  Unable to flex or extend the finger secondary to pain at those joints.  Skin:    General: Skin is warm and dry.     Capillary Refill: Capillary refill takes less than 2 seconds.  Neurological:     General: No focal deficit present.  Mental Status: She is alert. Mental status is at baseline.  Psychiatric:        Mood and Affect: Mood normal.        Behavior: Behavior is cooperative.     (all labs ordered are listed, but only abnormal results are displayed) Labs Reviewed  COMPREHENSIVE METABOLIC PANEL WITH GFR - Abnormal; Notable for the following components:      Result Value   Potassium 2.7 (*)    Chloride 97 (*)    Glucose, Bld 147 (*)    BUN 5 (*)    Calcium 8.2 (*)    Total Protein 6.2 (*)    AST 129 (*)    ALT 56 (*)    Alkaline Phosphatase 166 (*)    Anion gap 17 (*)    All other components within normal limits  CBC WITH DIFFERENTIAL/PLATELET - Abnormal; Notable for the following components:   WBC 11.5 (*)    RBC 3.46 (*)    HCT 35.1 (*)    MCV 101.4 (*)    MCH 34.7 (*)    Neutro Abs 10.2 (*)    All other components within normal limits  MAGNESIUM  - Abnormal; Notable for the following components:   Magnesium  1.0 (*)    All other components within normal limits  I-STAT CHEM 8, ED - Abnormal; Notable for the following components:   Potassium 2.6 (*)    Chloride 96 (*)    BUN 3 (*)    Glucose, Bld 146 (*)    Calcium, Ion 0.96 (*)    All other components within normal limits  GASTROINTESTINAL PANEL BY PCR, STOOL (REPLACES STOOL CULTURE)  C DIFFICILE QUICK SCREEN W PCR REFLEX    CULTURE, BLOOD (ROUTINE X 2)  CULTURE, BLOOD (ROUTINE X 2)  LIPASE, BLOOD  URINALYSIS, ROUTINE W REFLEX MICROSCOPIC  URINE DRUG SCREEN  ETHANOL  URIC ACID  PROCALCITONIN  PROCALCITONIN  LACTIC ACID,  PLASMA  LACTIC ACID, PLASMA  C-REACTIVE PROTEIN  SEDIMENTATION RATE    EKG: EKG Interpretation Date/Time:  Sunday June 30 2024 15:12:01 EST Ventricular Rate:  84 PR Interval:  159 QRS Duration:  94 QT Interval:  447 QTC Calculation: 529 R Axis:   70  Text Interpretation: Sinus rhythm Probable left atrial enlargement Anteroseptal infarct, age indeterminate Prolonged QT interval Confirmed by Mannie Pac [phone]) on 06/30/2024 4:26:26 PM  Radiology: CT ABDOMEN PELVIS W CONTRAST Result Date: 06/30/2024 EXAM: CT ABDOMEN AND PELVIS WITH CONTRAST 06/30/2024 04:04:02 PM TECHNIQUE: CT of the abdomen and pelvis was performed with the administration of 80 mL Omnipaque  300 MG/ML solution. Multiplanar reformatted images are provided for review. Automated exposure control, iterative reconstruction, and/or weight-based adjustment of the mA/kV was utilized to reduce the radiation dose to as low as reasonably achievable. COMPARISON: Comparison study 07/10/2023. CLINICAL HISTORY: Abdominal pain, acute, nonlocalized. FINDINGS: LOWER CHEST: The lung bases are clear of an acute process. No pulmonary nodules or pleural effusions. No pericardial effusion. Aortic and coronary artery calcifications are noted. LIVER: Diffuse and severe fatty infiltration of the liver along with hepatomegaly. GALLBLADDER AND BILE DUCTS: The gallbladder is mildly distended. No intrahepatic biliary dilatation. There is moderate common bile duct dilatation (10 mm) but this is a chronic finding. SPLEEN: The spleen is normal in size. No splenic lesions. PANCREAS: No pancreatic mass, ductal dilatation, or inflammation. ADRENAL GLANDS: The adrenal glands are normal. KIDNEYS, URETERS AND BLADDER: Stable malpositioned and duplicated collecting system of the right kidney. No renal lesions or hydronephrosis. No stones  in the kidneys or ureters. No perinephric or periureteral stranding. The bladder is unremarkable. GI AND BOWEL: The stomach,  duodenum, and small bowel are unremarkable. The terminal ileum is normal. No inflammatory changes or obstructive findings. There is a diffuse inflammatory or infectious process involving the colon with submucosal edema and mucosal and serosal enhancement of the entire colon. Findings could be due to C. difficile colitis. The appendix is surgically absent. PERITONEUM AND RETROPERITONEUM: No ascites. No free air. No mesenteric or retroperitoneal mass. VASCULATURE: Age advanced atherosclerotic calcification involving the aorta, iliac arteries, and branch vessels but no aneurysm or dissection. The branch vessels are patent. The major venous structures are patent. LYMPH NODES: Small scattered lymph nodes are stable. No pelvic or inguinal adenopathy. REPRODUCTIVE ORGANS: The uterus is normal. No adnexal mass. BONES AND SOFT TISSUES: The bony thorax is intact. No acute osseous abnormality. No subcutaneous lesions. No focal soft tissue abnormality. IMPRESSION: 1. Diffuse pancolitis, likely infectious or inflammatory etiology such as C. difficile colitis. 2. Diffuse and severe hepatic steatosis with hepatomegaly. 3. Moderate common bile duct dilation measuring 10 mm, chronic and unchanged. Electronically signed by: Maude Stammer MD 06/30/2024 04:28 PM EST RP Workstation: [id number]   DG Hand Complete Right Result Date: 06/30/2024 EXAM: 3 or more VIEW(S) XRAY OF THE HAND 06/30/2024 12:19:00 PM COMPARISON: 05/24/2023, 02/01/2024 CLINICAL HISTORY: Swelling and erythema to the DIP joint thumb and forefinger, gout, Crohn disease. FINDINGS: BONES AND JOINTS: Erosive changes of the index finger DIP joint with associated joint space loss. No acute fracture. No joint dislocation. SOFT TISSUES: Increased soft tissue swelling of the thumb and index finger compared to prior. IMPRESSION: 1. Erosive changes of the index finger DIP joint with associated joint space loss.given chronicity, favor inflammatory arthropathy or  gout.infection cannot technically be excluded but it is felt less likely given chronicity. 2. Increased soft tissue swelling of the thumb and index finger compared to prior, likely related to the clinical history of gout. Electronically signed by: Rockey Kilts MD 06/30/2024 12:53 PM EST RP Workstation: [id number]     Procedures   Medications Ordered in the ED  metroNIDAZOLE  (FLAGYL ) IVPB 500 mg (500 mg Intravenous New Bag/Given 06/30/24 1922)  potassium chloride  10 mEq in 100 mL IVPB (10 mEq Intravenous New Bag/Given 06/30/24 1924)  potassium chloride  SA (KLOR-CON  M) CR tablet 40 mEq (has no administration in time range)  magnesium  sulfate IVPB 1 g 100 mL (has no administration in time range)  ondansetron  (ZOFRAN ) injection 4 mg (4 mg Intravenous Given 06/30/24 1232)  morphine  (PF) 4 MG/ML injection 4 mg (4 mg Intravenous Given 06/30/24 1232)  potassium chloride  10 mEq in 100 mL IVPB (0 mEq Intravenous Stopped 06/30/24 1711)  magnesium  sulfate IVPB 1 g 100 mL (0 g Intravenous Stopped 06/30/24 1712)  loperamide  (IMODIUM ) capsule 4 mg (4 mg Oral Given 06/30/24 1551)  iohexol  (OMNIPAQUE ) 300 MG/ML solution 100 mL (80 mLs Intravenous Contrast Given 06/30/24 1555)  ceFEPIme (MAXIPIME) 2 g in sodium chloride  0.9 % 100 mL IVPB (0 g Intravenous Stopped 06/30/24 1920)  HYDROmorphone  (DILAUDID ) injection 1 mg (1 mg Intravenous Given 06/30/24 1936)                                    Medical Decision Making Amount and/or Complexity of Data Reviewed Labs: ordered. Radiology: ordered.  Risk Prescription drug management. Decision regarding hospitalization.   Medical Decision Making:   Shahida D  Seawright is a 53 y.o. female who presented to the ED today with hand swelling as well as nausea and diarrhea detailed above.    Patient's presentation is complicated by their history of tophaceous gout, chronic pain syndrome.  Patient placed on continuous vitals and telemetry monitoring while in ED which was reviewed  periodically.  Complete initial physical exam performed, notably the patient  was alert and oriented, in obvious discomfort and ill-appearing..    Reviewed and confirmed nursing documentation for past medical history, family history, social history.    Initial Assessment:   With the patient's presentation of hand swelling as well as nausea and diarrhea, regarding the hand swelling consider possible septic arthritis as well as tophaceous gout.  Consider possible paronychia however given prominence over the DIP joints find this less unlikely.  Regarding the nausea and diarrhea, consider possible bowel obstruction, possible viral and/or bacterial infection.   Initial Plan:  Obtain CT of the abdomen and pelvis to evaluate for acute abdominal pathology. Plain film imaging of the right hand to evaluate for possible osteomyelitis, degenerative changes of the right hand Screening labs including CBC and Metabolic panel to evaluate for infectious or metabolic etiology of disease.  Urinalysis with reflex culture ordered to evaluate for UTI or relevant urologic/nephrologic pathology.  Initial pain management with a dose of IV morphine , also provide ondansetron  for nausea control. Provide oral loperamide  for diarrhea control. EKG to evaluate for cardiac pathology Secondary to diarrhea, obtain stool cultures to evaluate for possible bacterial etiology. Objective evaluation as below reviewed   Initial Study Results:   Laboratory  All laboratory results reviewed without evidence of clinically relevant pathology.   Exceptions include: Potassium is 2.7, elevated transaminases of 129 for AST and ALT is 56.  Alk phos is 166.  Leukocytosis of 11.5.  Neutrophilia of 10.2.  Magnesium  is 1.0.  Gastrointestinal panels are pending.  EKG EKG was reviewed independently. Rate, rhythm, axis, intervals all examined and without medically relevant abnormality. ST segments without concerns for elevations.    Radiology:   All images reviewed independently. Agree with radiology report at this time.   CT ABDOMEN PELVIS W CONTRAST Result Date: 06/30/2024 EXAM: CT ABDOMEN AND PELVIS WITH CONTRAST 06/30/2024 04:04:02 PM TECHNIQUE: CT of the abdomen and pelvis was performed with the administration of 80 mL Omnipaque  300 MG/ML solution. Multiplanar reformatted images are provided for review. Automated exposure control, iterative reconstruction, and/or weight-based adjustment of the mA/kV was utilized to reduce the radiation dose to as low as reasonably achievable. COMPARISON: Comparison study 07/10/2023. CLINICAL HISTORY: Abdominal pain, acute, nonlocalized. FINDINGS: LOWER CHEST: The lung bases are clear of an acute process. No pulmonary nodules or pleural effusions. No pericardial effusion. Aortic and coronary artery calcifications are noted. LIVER: Diffuse and severe fatty infiltration of the liver along with hepatomegaly. GALLBLADDER AND BILE DUCTS: The gallbladder is mildly distended. No intrahepatic biliary dilatation. There is moderate common bile duct dilatation (10 mm) but this is a chronic finding. SPLEEN: The spleen is normal in size. No splenic lesions. PANCREAS: No pancreatic mass, ductal dilatation, or inflammation. ADRENAL GLANDS: The adrenal glands are normal. KIDNEYS, URETERS AND BLADDER: Stable malpositioned and duplicated collecting system of the right kidney. No renal lesions or hydronephrosis. No stones in the kidneys or ureters. No perinephric or periureteral stranding. The bladder is unremarkable. GI AND BOWEL: The stomach, duodenum, and small bowel are unremarkable. The terminal ileum is normal. No inflammatory changes or obstructive findings. There is a diffuse inflammatory or infectious  process involving the colon with submucosal edema and mucosal and serosal enhancement of the entire colon. Findings could be due to C. difficile colitis. The appendix is surgically absent. PERITONEUM AND RETROPERITONEUM: No  ascites. No free air. No mesenteric or retroperitoneal mass. VASCULATURE: Age advanced atherosclerotic calcification involving the aorta, iliac arteries, and branch vessels but no aneurysm or dissection. The branch vessels are patent. The major venous structures are patent. LYMPH NODES: Small scattered lymph nodes are stable. No pelvic or inguinal adenopathy. REPRODUCTIVE ORGANS: The uterus is normal. No adnexal mass. BONES AND SOFT TISSUES: The bony thorax is intact. No acute osseous abnormality. No subcutaneous lesions. No focal soft tissue abnormality. IMPRESSION: 1. Diffuse pancolitis, likely infectious or inflammatory etiology such as C. difficile colitis. 2. Diffuse and severe hepatic steatosis with hepatomegaly. 3. Moderate common bile duct dilation measuring 10 mm, chronic and unchanged. Electronically signed by: Maude Stammer MD 06/30/2024 04:28 PM EST RP Workstation: [id number]   DG Hand Complete Right Result Date: 06/30/2024 EXAM: 3 or more VIEW(S) XRAY OF THE HAND 06/30/2024 12:19:00 PM COMPARISON: 05/24/2023, 02/01/2024 CLINICAL HISTORY: Swelling and erythema to the DIP joint thumb and forefinger, gout, Crohn disease. FINDINGS: BONES AND JOINTS: Erosive changes of the index finger DIP joint with associated joint space loss. No acute fracture. No joint dislocation. SOFT TISSUES: Increased soft tissue swelling of the thumb and index finger compared to prior. IMPRESSION: 1. Erosive changes of the index finger DIP joint with associated joint space loss.given chronicity, favor inflammatory arthropathy or gout.infection cannot technically be excluded but it is felt less likely given chronicity. 2. Increased soft tissue swelling of the thumb and index finger compared to prior, likely related to the clinical history of gout. Electronically signed by: Rockey Kilts MD 06/30/2024 12:53 PM EST RP Workstation: [id number]      Consults: Case discussed with Dr. Waddell with hospitalist team.   Reassessment and  Plan:   CT imaging does show diffuse pancolitis, and as a result patient was began on antibiotics per pharmacy consult.  Due to hypokalemia as she has gotten several runs of IV potassium, as well as IV magnesium .  Given this, ongoing pain control with Dilaudid  after initial pain control faded.  Given findings of pancolitis, consulted with the hospitalist team for admission for continued monitoring of her potassium for hypokalemia as well as hypomagnesemia.  They except for continued monitoring of potassium magnesium  and repletion, as well as continued monitoring of her bowel status and further indicated therapy.       Final diagnoses:  Pancolitis  Hypokalemia  Hypomagnesemia  Gouty arthritis of hand    ED Discharge Orders     None          Myriam Dorn BROCKS, [place] 06/30/24 1958    Geraldene Hamilton, MD 07/02/24 1324

## 2024-06-30 NOTE — Assessment & Plan Note (Signed)
 Optimize electrolytes Keep on telemetry Avoid qt prolonging drugs  Repeat ekg in AM

## 2024-06-30 NOTE — H&P (Signed)
 History and Physical    Patient: [name] FMW:[id number] DOB: 01/23/1971 DOA: 06/30/2024 DOS: the patient was seen and examined on 06/30/2024 PCP: [facility]  Patient coming from: Home - lives with her partner.  Uses walker and cane to ambulate.    Chief Complaint: hand pain and diarrhea and vomiting.   HPI: [name] is a 53 y.o. female with medical history significant of HTN, hx of polysubstance abuse in the past,  anxiety and depression, tophaceous gout, OSA, ?Crohn's disease, chronic pain syndrome on Subutex , pancreatic insufficiency with chronic nausea who presented to ED with complaints of hand pain and diarrhea and vomiting. She states her right hand fingers (thumb and pointer finger have swelling in the distal joints. She has known tophaceous gout and states the swelling and pain got worse over this past week and she couldn't take it anymore so came to hospital. She also states she has chronic nausea and vomiting and diarrhea. She states this is about the same, but now she has been unable to eat or keep anything down. She is having about 6-7 watery stools/day, denies any blood and about 2-3 times of emesis daily. Having a hard time keeping down medication. Doesn't really endorse abdominal pain, but states has some mild pain in her epigastric area. Denies any fever or chills.    Denies any fever/chills, vision changes/headaches, chest pain or palpitations, shortness of breath or cough, dysuria or leg swelling.    She smokes about 1PPD and drinks alcohol at least 4x/week. Can't really quantify how much.   ER Course:  vitals: afebrile, bp: 171/107, HR: 87, RR: 18, oxygen: 96%RA Pertinent labs: wbc: 11.5, MCV: 101.4, potassium: 2.7, AST: 129, ALT: 56, AP: 166, AG: 17, magnesium : 1.0,  Right hand: . Erosive changes of the index finger DIP joint with associated joint space loss.given chronicity, favor inflammatory arthropathy or gout.infection cannot technically be  excluded but it is felt less likely given chronicity. 2. Increased soft tissue swelling of the thumb and index finger compared to prior, likely related to the clinical history of gout. CT abdomen/pelvis: . Diffuse pancolitis, likely infectious or inflammatory etiology such as C. difficile colitis. 2. Diffuse and severe hepatic steatosis with hepatomegaly. 3. Moderate common bile duct dilation measuring 10 mm, chronic and unchanged. In ED: given cefepime and flagyl , potassium repleted, magnesium  repleted and given pain medication. Stool studies ordered. TRH asked to admit.     Review of Systems: As mentioned in the history of present illness. All other systems reviewed and are negative. Past Medical History:  Diagnosis Date   Alcohol dependence in sustained full remission    Benign essential hypertension 04/09/2021   Chronic pain syndrome 04/09/2021   Crohn's disease - self reported. no biopsy to prove it    Dilated cbd, acquired 04/09/2021   Elevated troponin I level 09/12/2022   Gallstone pancreatitis 04/09/2021   Generalized anxiety disorder    Gout 04/09/2021   History of heroin use    Kidney stones    Major depressive disorder    Obstructive sleep apnea    Opiate withdrawal (HCC) - possible 09/12/2022   Past Surgical History:  Procedure Laterality Date   LEEP     LITHOTRIPSY     TUBAL LIGATION     Social History:  reports that she has been smoking cigarettes. She has been exposed to tobacco smoke. She has never used smokeless tobacco. She reports current alcohol use of about 4.0 - 5.0 standard drinks of  alcohol per week. She reports that she does not currently use drugs.  Allergies  Allergen Reactions   Penicillins Hives    Family History  Problem Relation Age of Onset   Thyroid disease Mother    Dementia Paternal Grandmother     Prior to Admission medications   Medication Sig Start Date End Date Taking? Authorizing Provider  albuterol  (VENTOLIN  HFA) 108 (90 Base)  MCG/ACT inhaler Inhale 2 puffs into the lungs 2 (two) times daily. Patient taking differently: Inhale 2 puffs into the lungs as needed. 07/26/23  Yes   buprenorphine  (SUBUTEX ) 8 MG SUBL SL tablet Place 0.5 tablets (4 mg total) under the tongue 2 (two) times daily. Patient taking differently: Place 4 mg under the tongue daily. 03/28/24  Yes   celecoxib  (CELEBREX ) 100 MG capsule Take 1 tablet by mouth with food 1 or 2 times daily as needed for arthritis 04/03/24  Yes Rice, Lonni ORN, MD  ferrous sulfate 325 (65 FE) MG EC tablet Take 325 mg by mouth daily.   Yes [provider]  gabapentin  (NEURONTIN ) 100 MG capsule Take 1 capsule (100 mg total) by mouth 2 (two) times daily. 05/02/24  Yes   lipase/protease/amylase (CREON ) 36000 UNITS CPEP capsule Take 2 capsules by mouth with a meals and 1 capsule by mouth with snacks. 12/20/23  Yes   loperamide  (IMODIUM ) 2 MG capsule Take 2 capsules (4 mg total) by mouth every 6 (six) hours as needed for diarrhea or loose stools. 08/05/23  Yes Pokhrel, Vernal, MD  ondansetron  (ZOFRAN -ODT) 4 MG disintegrating tablet Dissolve 1 tablet (4 mg total) by mouth every 8 (eight) hours as needed for nausea or vomiting for up to 90 days 03/14/24  Yes   pravastatin  (PRAVACHOL ) 20 MG tablet Take 1 tablet (20 mg total) by mouth daily. 03/18/24  Yes   traMADol  (ULTRAM ) 50 MG tablet Take 1-2 tablets (50-100 mg total) by mouth every 8 (eight) hours as needed. 11/30/23  Yes   allopurinol  (ZYLOPRIM ) 100 MG tablet Take 2 tablets (200 mg total) by mouth daily. 11/30/23     amLODipine  (NORVASC ) 5 MG tablet Take 1 tablet (5 mg total) by mouth daily. 11/08/23   Jerrye Katheryn BROCKS, MD  buprenorphine  (SUBUTEX ) 8 MG SUBL SL tablet Place 0.5 tablets (4 mg total) under the tongue 3 (three) times daily as needed. Patient not taking: Reported on 06/30/2024 04/26/24     buprenorphine  (SUBUTEX ) 8 MG SUBL SL tablet Place 0.5 tablets (4 mg total) under the tongue 3 (three) times daily as needed. Patient not  taking: Reported on 06/30/2024 05/24/24     buprenorphine  (SUBUTEX ) 8 MG SUBL SL tablet Place 0.5 tablets (4 mg total) under the tongue 3 (three) times daily as needed Patient not taking: Reported on 06/30/2024 06/24/24     lipase/protease/amylase (CREON ) 36000 UNITS CPEP capsule Take  2 capsules with a meal 1 capsule with a snack. Patient not taking: Reported on 06/30/2024 05/02/24     lisinopril  (ZESTRIL ) 20 MG tablet Take 20 mg by mouth daily.    [provider]  Na Sulfate-K Sulfate-Mg Sulfate concentrate (SUPREP BOWEL PREP KIT) 17.5-3.13-1.6 GM/177ML SOLN Use as directed Patient not taking: No sig reported 09/27/23     pravastatin  (PRAVACHOL ) 20 MG tablet Take 1 tablet (20 mg total) by mouth daily. Patient not taking: No sig reported 03/04/24     pravastatin  (PRAVACHOL ) 20 MG tablet Take 1 tablet (20 mg total) by mouth daily. Patient not taking: Reported on 06/30/2024 06/20/24  traMADol  (ULTRAM ) 50 MG tablet Take 1 to 2 tablet(s) by mouth every eight hours as needed Patient not taking: No sig reported 12/28/23     traMADol  (ULTRAM ) 50 MG tablet Take 1 to 2 tablets (100 mg total) by mouth every 8 (eight) hours as needed. Patient not taking: No sig reported 01/29/24       Physical Exam: Vitals:   06/30/24 1926 06/30/24 2000 06/30/24 2110 06/30/24 2147  BP:  (!) 186/110  (!) 174/114  Pulse:  85  89  Resp:  15  15  Temp:    98.6 F (37 C)  TempSrc:      SpO2: 98% 96% 97% 98%  Weight:      Height:       General:  Appears calm and comfortable and is in NAD. Thin and ill appearing  Eyes:  PERRL, EOMI, normal lids, iris ENT:  grossly normal hearing, lips & tongue, mmm; poor dentition Neck:  no LAD, masses or thyromegaly; no carotid bruits Cardiovascular:  RRR, no m/r/g. No LE edema.  Respiratory:   CTA bilaterally with no wheezes/rales/rhonchi.  Normal respiratory effort. Abdomen:  soft, NT, ND, NABS Back:   normal alignment, no CVAT Skin:  no rash or induration seen on limited  exam Musculoskeletal:  grossly normal tone BUE/BLE, good ROM Left hand: thumb and pointer finger DIP with erythema and significant edema and white tophaceous material   Lower extremity:  No LE edema.  Limited foot exam with no ulcerations.  2+ distal pulses. Psychiatric:  grossly normal mood and affect, speech fluent and appropriate, AOx3 Neurologic:  CN 2-12 grossly intact, moves all extremities in coordinated fashion, sensation intact   Radiological Exams on Admission: Independently reviewed - see discussion in A/P where applicable  CT ABDOMEN PELVIS W CONTRAST Result Date: 06/30/2024 EXAM: CT ABDOMEN AND PELVIS WITH CONTRAST 06/30/2024 04:04:02 PM TECHNIQUE: CT of the abdomen and pelvis was performed with the administration of 80 mL Omnipaque  300 MG/ML solution. Multiplanar reformatted images are provided for review. Automated exposure control, iterative reconstruction, and/or weight-based adjustment of the mA/kV was utilized to reduce the radiation dose to as low as reasonably achievable. COMPARISON: Comparison study 07/10/2023. CLINICAL HISTORY: Abdominal pain, acute, nonlocalized. FINDINGS: LOWER CHEST: The lung bases are clear of an acute process. No pulmonary nodules or pleural effusions. No pericardial effusion. Aortic and coronary artery calcifications are noted. LIVER: Diffuse and severe fatty infiltration of the liver along with hepatomegaly. GALLBLADDER AND BILE DUCTS: The gallbladder is mildly distended. No intrahepatic biliary dilatation. There is moderate common bile duct dilatation (10 mm) but this is a chronic finding. SPLEEN: The spleen is normal in size. No splenic lesions. PANCREAS: No pancreatic mass, ductal dilatation, or inflammation. ADRENAL GLANDS: The adrenal glands are normal. KIDNEYS, URETERS AND BLADDER: Stable malpositioned and duplicated collecting system of the right kidney. No renal lesions or hydronephrosis. No stones in the kidneys or ureters. No perinephric or  periureteral stranding. The bladder is unremarkable. GI AND BOWEL: The stomach, duodenum, and small bowel are unremarkable. The terminal ileum is normal. No inflammatory changes or obstructive findings. There is a diffuse inflammatory or infectious process involving the colon with submucosal edema and mucosal and serosal enhancement of the entire colon. Findings could be due to C. difficile colitis. The appendix is surgically absent. PERITONEUM AND RETROPERITONEUM: No ascites. No free air. No mesenteric or retroperitoneal mass. VASCULATURE: Age advanced atherosclerotic calcification involving the aorta, iliac arteries, and branch vessels but no aneurysm or dissection. The  branch vessels are patent. The major venous structures are patent. LYMPH NODES: Small scattered lymph nodes are stable. No pelvic or inguinal adenopathy. REPRODUCTIVE ORGANS: The uterus is normal. No adnexal mass. BONES AND SOFT TISSUES: The bony thorax is intact. No acute osseous abnormality. No subcutaneous lesions. No focal soft tissue abnormality. IMPRESSION: 1. Diffuse pancolitis, likely infectious or inflammatory etiology such as C. difficile colitis. 2. Diffuse and severe hepatic steatosis with hepatomegaly. 3. Moderate common bile duct dilation measuring 10 mm, chronic and unchanged. Electronically signed by: Maude Stammer MD 06/30/2024 04:28 PM EST RP Workstation: [id number]   DG Hand Complete Right Result Date: 06/30/2024 EXAM: 3 or more VIEW(S) XRAY OF THE HAND 06/30/2024 12:19:00 PM COMPARISON: 05/24/2023, 02/01/2024 CLINICAL HISTORY: Swelling and erythema to the DIP joint thumb and forefinger, gout, Crohn disease. FINDINGS: BONES AND JOINTS: Erosive changes of the index finger DIP joint with associated joint space loss. No acute fracture. No joint dislocation. SOFT TISSUES: Increased soft tissue swelling of the thumb and index finger compared to prior. IMPRESSION: 1. Erosive changes of the index finger DIP joint with associated  joint space loss.given chronicity, favor inflammatory arthropathy or gout.infection cannot technically be excluded but it is felt less likely given chronicity. 2. Increased soft tissue swelling of the thumb and index finger compared to prior, likely related to the clinical history of gout. Electronically signed by: Rockey Kilts MD 06/30/2024 12:53 PM EST RP Workstation: [id number]    EKG: Independently reviewed.  NSR with rate 84; nonspecific ST changes with no evidence of acute ischemia. Prolonged QT    Labs on Admission: I have personally reviewed the available labs and imaging studies at the time of the admission.  Pertinent labs:   wbc: 11.5,  MCV: 101.4,  potassium: 2.7,  AST: 129,  ALT: 56,  AP: 166,  AG: 17,  magnesium : 1.0  Assessment and Plan: Principal Problem:   Colitis Active Problems:   Hypokalemia   Hypomagnesemia   Prolonged QT interval   Tophaceous gout   Abnormal LFTs (liver function tests)   Alcohol use   Benign essential hypertension   Dilated cbd, acquired   Chronic pain syndrome   Pancreatic insufficiency   Generalized anxiety disorder   History of heroin use   Tobacco use    Assessment and Plan: * Colitis -53 year old presenting to ED with acute on chronic diarrhea and vomiting and inability to keep anything down with poor PO intake x 1 week found to have diffuse pancolitis on CT imaging  -no SIRS criteria met, denies any fevers at home  -followed at [facility], Dr. Violeta. Can not see notes, but she states she had EGD and colitis either early this year or last year. She states her colonoscopy showed benign polyps and she can't remember EGD results  -check PCT/inflammatory markers -received abx in ED, but continue conservative therapy for now -stool studies pending -continue enteric precautions  -clear liquid diet for now  -continue creon   -consider GI consult in patient    Hypokalemia Likely secondary to diarrhea and poor PO intake  Given  in ED Given additional 40meq PO now Replete magnesium  Keep on tele Trend  -repeat EKG with U waves, repeat potassium. Troponin negative x 1   Hypomagnesemia Magnesium  very low at 1.0 Given 1g in ED, giving additional 1g now  Trend   Prolonged QT interval Optimize electrolytes Keep on telemetry Avoid qt prolonging drugs  Repeat ekg in AM    Tophaceous gout Has been  off her allopurinol  for unknown amount of time with alcohol use No SIRS criteria and normal lactic acid Uric acid, inflammatory markers pending Seen by rheumatology Quite edematous and painful Start prednisone  and colchicine  Due to chronicity may need surgical intervention, but not during acute flare. Consider hand consult    Abnormal LFTs (liver function tests) History of elevated LFTS (AST>ALT) appears to be alcohol related trend Admits to drinking 4x or more a week  Check ethanol Severe hepatic steatosis with hepatomegaly  Check hep C  Seen by general surgery with negative HIDA and no gallstones July 2025 Trend   Alcohol use Drinking at least 4x/week Vague about quantity No hx of withdrawals CIWA protocol Mv, thiamine  and folic acid    Benign essential hypertension Off her blood pressure medication for unknown amount of time  Start back norvasc  5mg  and lisinopril  20mg  daily  PRN metoprolol pending UDS negative for cocaine   Dilated cbd, acquired -chronic, no change -negative HIDA per surgery note 02/2024   Chronic pain syndrome Continue subutex , gabapentin  and cymbalta PMP verified PRN morphine  for severe pain for the gout  Pancreatic insufficiency Continue creon    Generalized anxiety disorder Continue cymbalta   History of heroin use Check UDS   Tobacco use Smoking 1PPD Nicotine  patch Encouraged cessation        Advance Care Planning:   Code Status: Full Code    Consultants: None in ED    Procedures: None    Antibiotics: Cefepime and flagyl  in ED 06/30/24      DVT Prophylaxis: lovenox    Family Communication: none   Severity of Illness: The appropriate patient status for this patient is OBSERVATION. Observation status is judged to be reasonable and necessary in order to provide the required intensity of service to ensure the patient's safety. The patient's presenting symptoms, physical exam findings, and initial radiographic and laboratory data in the context of their medical condition is felt to place them at decreased risk for further clinical deterioration. Furthermore, it is anticipated that the patient will be medically stable for discharge from the hospital within 2 midnights of admission.   Author: Isaiah Geralds, MD 06/30/2024 9:54 PM  For on call review [email or web address].

## 2024-06-30 NOTE — ED Triage Notes (Signed)
 Pt bib EMS from home. Nausea x1 month. Hx gallstones. Finger and thumb R hand and R ankle. HTN non-compliant with meds. Walks with cane. A&Ox4. BP 156/105 HR 90 O2 98% 154 CBG. 650 Tylenol 

## 2024-06-30 NOTE — Assessment & Plan Note (Signed)
 Continue creon.

## 2024-06-30 NOTE — ED Notes (Signed)
Pt taken for scan.

## 2024-06-30 NOTE — Assessment & Plan Note (Signed)
 Check UDS

## 2024-06-30 NOTE — Assessment & Plan Note (Signed)
 Continue cymbalta

## 2024-06-30 NOTE — Assessment & Plan Note (Signed)
 Magnesium  very low at 1.0 Given 1g in ED, giving additional 1g now  Trend

## 2024-07-01 DIAGNOSIS — M1A9XX1 Chronic gout, unspecified, with tophus (tophi): Secondary | ICD-10-CM | POA: Diagnosis not present

## 2024-07-01 DIAGNOSIS — K529 Noninfective gastroenteritis and colitis, unspecified: Secondary | ICD-10-CM | POA: Diagnosis not present

## 2024-07-01 LAB — C DIFFICILE QUICK SCREEN W PCR REFLEX
C Diff antigen: POSITIVE — AB
C Diff toxin: NEGATIVE

## 2024-07-01 LAB — CLOSTRIDIUM DIFFICILE BY PCR, REFLEXED
Hypervirulent Strain: NEGATIVE
Toxigenic C. Difficile by PCR: POSITIVE — AB

## 2024-07-01 LAB — HEPATITIS PANEL, ACUTE
HCV Ab: NONREACTIVE
Hep A IgM: NONREACTIVE
Hep B C IgM: NONREACTIVE
Hepatitis B Surface Ag: NONREACTIVE

## 2024-07-01 LAB — CBC
HCT: 31.1 % — ABNORMAL LOW (ref 36.0–46.0)
Hemoglobin: 10.2 g/dL — ABNORMAL LOW (ref 12.0–15.0)
MCH: 34.8 pg — ABNORMAL HIGH (ref 26.0–34.0)
MCHC: 32.8 g/dL (ref 30.0–36.0)
MCV: 106.1 fL — ABNORMAL HIGH (ref 80.0–100.0)
Platelets: 202 K/uL (ref 150–400)
RBC: 2.93 MIL/uL — ABNORMAL LOW (ref 3.87–5.11)
RDW: 13.3 % (ref 11.5–15.5)
WBC: 5.1 K/uL (ref 4.0–10.5)
nRBC: 0 % (ref 0.0–0.2)

## 2024-07-01 LAB — COMPREHENSIVE METABOLIC PANEL WITH GFR
ALT: 47 U/L — ABNORMAL HIGH (ref 0–44)
AST: 147 U/L — ABNORMAL HIGH (ref 15–41)
Albumin: 3 g/dL — ABNORMAL LOW (ref 3.5–5.0)
Alkaline Phosphatase: 150 U/L — ABNORMAL HIGH (ref 38–126)
Anion gap: 9 (ref 5–15)
BUN: 6 mg/dL (ref 6–20)
CO2: 24 mmol/L (ref 22–32)
Calcium: 7.5 mg/dL — ABNORMAL LOW (ref 8.9–10.3)
Chloride: 108 mmol/L (ref 98–111)
Creatinine, Ser: 0.75 mg/dL (ref 0.44–1.00)
GFR, Estimated: >60 mL/min (ref >60)
Glucose, Bld: 137 mg/dL — ABNORMAL HIGH (ref 70–99)
Potassium: 3.4 mmol/L — ABNORMAL LOW (ref 3.5–5.1)
Sodium: 141 mmol/L (ref 135–145)
Total Bilirubin: 1 mg/dL (ref 0.0–1.2)
Total Protein: 5.3 g/dL — ABNORMAL LOW (ref 6.5–8.1)

## 2024-07-01 LAB — PROCALCITONIN: Procalcitonin: <0.1 ng/mL

## 2024-07-01 LAB — HIV ANTIBODY (ROUTINE TESTING W REFLEX): HIV Screen 4th Generation wRfx: NONREACTIVE

## 2024-07-01 LAB — MAGNESIUM: Magnesium: 1.9 mg/dL (ref 1.7–2.4)

## 2024-07-01 MED ORDER — HYDROMORPHONE HCL 1 MG/ML IJ SOLN
1.0000 mg | INTRAMUSCULAR | Status: DC | PRN
  Administered 2024-07-01 (×2): 1 mg via INTRAVENOUS
  Filled 2024-07-01 (×2): qty 1

## 2024-07-01 MED ORDER — HYDRALAZINE HCL 20 MG/ML IJ SOLN
10.0000 mg | INTRAMUSCULAR | Status: DC | PRN
  Administered 2024-07-03: 10 mg via INTRAVENOUS
  Filled 2024-07-01: qty 1

## 2024-07-01 MED ORDER — BOOST / RESOURCE BREEZE PO LIQD CUSTOM
1.0000 | Freq: Three times a day (TID) | ORAL | Status: DC
  Administered 2024-07-01 – 2024-07-03 (×5): 1 via ORAL

## 2024-07-01 MED ORDER — PNEUMOCOCCAL 20-VAL CONJ VACC 0.5 ML IM SUSY
0.5000 mL | PREFILLED_SYRINGE | INTRAMUSCULAR | Status: AC
  Administered 2024-07-02: 0.5 mL via INTRAMUSCULAR
  Filled 2024-07-01: qty 0.5

## 2024-07-01 MED ORDER — POTASSIUM CHLORIDE CRYS ER 20 MEQ PO TBCR
40.0000 meq | EXTENDED_RELEASE_TABLET | Freq: Once | ORAL | Status: AC
  Administered 2024-07-01: 40 meq via ORAL
  Filled 2024-07-01: qty 2

## 2024-07-01 MED ORDER — HYDROMORPHONE HCL 1 MG/ML IJ SOLN
2.0000 mg | INTRAMUSCULAR | Status: DC | PRN
  Administered 2024-07-01 – 2024-07-06 (×21): 2 mg via INTRAVENOUS
  Filled 2024-07-01 (×22): qty 2

## 2024-07-01 MED ORDER — OXYCODONE HCL 5 MG PO TABS
5.0000 mg | ORAL_TABLET | ORAL | Status: DC | PRN
  Administered 2024-07-01 (×2): 5 mg via ORAL
  Filled 2024-07-01 (×2): qty 1

## 2024-07-01 MED ORDER — LABETALOL HCL 5 MG/ML IV SOLN
10.0000 mg | INTRAVENOUS | Status: DC | PRN
  Administered 2024-07-01 – 2024-07-05 (×4): 10 mg via INTRAVENOUS
  Filled 2024-07-01 (×5): qty 4

## 2024-07-01 MED ORDER — INFLUENZA VIRUS VACC SPLIT PF (FLUZONE) 0.5 ML IM SUSY
0.5000 mL | PREFILLED_SYRINGE | INTRAMUSCULAR | Status: AC
  Administered 2024-07-02: 0.5 mL via INTRAMUSCULAR
  Filled 2024-07-01: qty 0.5

## 2024-07-01 MED ORDER — OXYCODONE HCL 5 MG PO TABS
10.0000 mg | ORAL_TABLET | ORAL | Status: DC | PRN
  Administered 2024-07-02 – 2024-07-06 (×13): 10 mg via ORAL
  Filled 2024-07-01 (×13): qty 2

## 2024-07-01 NOTE — Progress Notes (Signed)
 Office Visit Note  Patient: [name]             Date of Birth: March 01, 1971           MRN: [id number]             PCP: [facility] Referring: [facility] [place] [facility] Visit Date: 07/15/2024   Subjective:   Discussed the use of AI scribe software for clinical note transcription with the patient, who gave verbal consent to proceed.  History of Present Illness   [name] is a 53 year old female with gout and osteoarthritis who presents with joint pain and swelling.   She is currently undergoing treatment for C. diff colitis with oral antibiotics, with two to three days remaining in the course. Despite improvement, she continues to experience diarrhea, which may be related to the antibiotics as well.  She has a history of gout, which recently flared up severely, requiring hospital treatment. She does not recall the specific medications used during her hospital stay but is currently on allopurinol , taking two 100 mg pills daily. Her uric acid levels were checked during the flare. She is also taking Celebrex , which has helped with inflammation without causing additional gastrointestinal issues.  She experiences difficulty eating and a lack of appetite, accompanied by persistent nausea, for which she is taking Zofran . These symptoms have persisted for a couple of weeks before she sought hospital care.  She faces challenges with meal preparation and accessing services like Meals on Wheels due to issues with her phone, which has a broken screen.      Previous HPI 04/03/2024 [name] is a 53 year old female with osteoarthritis who presents with joint pain and swelling.   She experiences significant joint pain and swelling, particularly in her hands, feet, and ankles, which is severe enough to prevent her from putting full pressure on her feet and ankles.  She is ambulatory with a rolling walker.  Also continues to see swelling with a lot of joint pain and stiffness  in multiple finger joints.   Lab test are initial visit looks good with a normal sedimentation rate of 2 and uric acid was at goal of 5.2 on the allopurinol  200 mg daily.   She manages her pain with tramadol , which provides relief but does not address swelling. She also takes BC powder daily.  She is not currently on any maintenance NSAID drug for joint inflammation. Her family history is notable for osteoarthritis, affecting her mother and sister. Her sister's condition was severe enough to necessitate a wheelchair due to spinal involvement.   She reports difficulty with daily activities such as cooking, relying on the microwave for meals due to her symptoms. She reports difficulty with very poor appetite, managed with Creon  which is partially effective.         Previous HPI 02/01/24 [name] is a 53 year old female with arthritis and gout who presents with chronic joint pain and swelling.   She has experienced arthritis symptoms for many years, with significant issues in her hands, feet, and ankles. 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 Episodes of severe pain have led to emergency room visits and the use of crutches and a cane. These symptoms began approximately six to seven years ago.   She experiences constant pain in her knees, ankles, and feet, with occasional swelling if she is on her feet too  much. She uses a walker for mobility when necessary, especially for trips to a nearby store. Her hands also swell and are described as 'snap, crackle, pop.' She takes allopurinol , two tablets daily, and uses tramadol  as needed for pain flare-ups.   She reports a tingling sensation in her hip that started a few months ago, which is uncomfortable and painful but does not radiate. She also mentions a history of bulging discs in her lumbar spine, specifically L3, L4, and L5, and has had fluoroscopy spinal injections in the  past, though she had a reaction to the steroid used.   Her family history includes arthritis, with her mother and sister also affected. Her sister was wheelchair-bound due to severe spinal arthritis. She has a history of kidney stones, which are calcium-based, and has undergone emergency procedures in the past to address ureteral blockages.   She has a digital cyst on her thumb that has required drainage in the past, with a thick, chalky white substance noted upon drainage. No numbness in fingers or toes but tingling in the hip.   Uric acid 07/2023 5.7 05/2023 13.6   Imaging reviewed 08/04/23 Xray Left hand IMPRESSION: 1. Moderate third DIP osteoarthritis. 2. Mild-to-moderate thumb interphalangeal and second, fourth, and fifth DIP osteoarthritis.   Review of Systems  Constitutional:  Positive for fatigue.  HENT:  Positive for mouth dryness. Negative for mouth sores.   Eyes:  Positive for dryness.  Respiratory:  Negative for shortness of breath.   Cardiovascular:  Positive for palpitations. Negative for chest pain.  Gastrointestinal:  Positive for diarrhea. Negative for blood in stool and constipation.  Endocrine: Negative for increased urination.  Genitourinary:  Positive for involuntary urination.  Musculoskeletal:  Positive for joint pain, gait problem, joint pain, joint swelling, myalgias, muscle weakness, morning stiffness, muscle tenderness and myalgias.  Skin:  Positive for color change and rash. Negative for hair loss and sensitivity to sunlight.  Allergic/Immunologic: Positive for susceptible to infections.  Neurological:  Positive for light-headedness. Negative for dizziness and headaches.  Hematological:  Negative for swollen glands.  Psychiatric/Behavioral:  Negative for depressed mood and sleep disturbance. The patient is not nervous/anxious.     PMFS History:  Patient Active Problem List   Diagnosis Date Noted   C. 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 no biopsy proving Crohn's disease. small biopsy on 03-18-2003    Obstructive sleep apnea    Gout 04/09/2021   Dilated cbd, acquired 04/09/2021   Chronic pain syndrome 04/09/2021   Benign essential hypertension 04/09/2021   Alcohol dependence in sustained full remission     Past Medical History:  Diagnosis Date   Alcohol dependence in sustained full remission    Benign essential hypertension 04/09/2021   Chronic pain syndrome 04/09/2021   Crohn's disease -  self reported. no biopsy to prove it    Dilated cbd, acquired 04/09/2021   Elevated troponin I level 09/12/2022   Gallstone pancreatitis 04/09/2021   Generalized anxiety disorder    Gout 04/09/2021   History of heroin use    Kidney stones    Major depressive disorder    Obstructive sleep apnea    Opiate withdrawal (HCC) - possible 09/12/2022   Pancolitis 06/30/2024    Family History  Problem Relation Age of Onset   Thyroid disease Mother    Dementia Paternal Grandmother    Past Surgical History:  Procedure Laterality Date   LEEP     LITHOTRIPSY     TUBAL LIGATION     Social History   Social History Narrative   Right handed   One story home   Caffeine yes   Immunization History  Administered Date(s)  Administered   Influenza, Seasonal, Injecte, Preservative Fre 07/13/2023, 07/02/2024   Influenza,inj,Quad PF,6+ Mos 06/14/2021   PFIZER(Purple Top)SARS-COV-2 Vaccination 11/14/2019, 12/09/2019   PNEUMOCOCCAL CONJUGATE-20 07/02/2024   Tdap 04/21/2021     Objective: Vital Signs: BP (!) 183/107 Comment: patient states she has not had her BP meds for the day  Pulse 81   Temp (!) 97.5 F (36.4 C)   Resp 17   Ht 5' 6 (1.676 m)   Wt 105 lb 6.4 oz (47.8 kg)   BMI 17.01 kg/m    Physical Exam Constitutional:      Comments: Underweight  Eyes:     Conjunctiva/sclera: Conjunctivae normal.  Cardiovascular:     Rate and Rhythm: Normal rate and regular rhythm.  Pulmonary:     Effort: Pulmonary effort is normal.     Breath sounds: Normal breath sounds.  Lymphadenopathy:     Cervical: No cervical adenopathy.  Skin:    General: Skin is warm and dry.  Neurological:     Mental Status: She is alert.  Psychiatric:        Mood and Affect: Mood normal.      Musculoskeletal Exam:  Shoulders full ROM no tenderness or swelling Elbows full ROM no tenderness or swelling Wrists full ROM no tenderness or swelling Right index finger with solid, fixed, nontender nodule, thumb interphalangeal joint with palpable swelling on the dorsal side without any tenderness, Right 2nd DIP and 1st IP peeling over top Knees full ROM no tenderness or swelling Degenerative joint changes in toes on both feet, no palpable swelling    Investigation: No additional findings.  Imaging: CT ABDOMEN PELVIS W CONTRAST Result Date: 06/30/2024 EXAM: CT ABDOMEN AND PELVIS WITH CONTRAST 06/30/2024 04:04:02 PM TECHNIQUE: CT of the abdomen and pelvis was performed with the administration of 80 mL Omnipaque  300 MG/ML solution. Multiplanar reformatted images are provided for review. Automated exposure control, iterative reconstruction, and/or weight-based adjustment of the mA/kV was utilized to reduce the radiation dose to as  low as reasonably achievable. COMPARISON: Comparison study 07/10/2023. CLINICAL HISTORY: Abdominal pain, acute, nonlocalized. FINDINGS: LOWER CHEST: The lung bases are clear of an acute process. No pulmonary nodules or pleural effusions. No pericardial effusion. Aortic and coronary artery calcifications are noted. LIVER: Diffuse and severe fatty infiltration of the liver along with hepatomegaly. GALLBLADDER AND BILE DUCTS: The gallbladder is mildly distended. No intrahepatic biliary dilatation. There is moderate common bile duct dilatation (10 mm) but this is a chronic finding. SPLEEN: The spleen is normal in size. No splenic lesions. PANCREAS: No pancreatic mass, ductal dilatation, or inflammation. ADRENAL GLANDS: The adrenal glands are  normal. KIDNEYS, URETERS AND BLADDER: Stable malpositioned and duplicated collecting system of the right kidney. No renal lesions or hydronephrosis. No stones in the kidneys or ureters. No perinephric or periureteral stranding. The bladder is unremarkable. GI AND BOWEL: The stomach, duodenum, and small bowel are unremarkable. The terminal ileum is normal. No inflammatory changes or obstructive findings. There is a diffuse inflammatory or infectious process involving the colon with submucosal edema and mucosal and serosal enhancement of the entire colon. Findings could be due to C. difficile colitis. The appendix is surgically absent. PERITONEUM AND RETROPERITONEUM: No ascites. No free air. No mesenteric or retroperitoneal mass. VASCULATURE: Age advanced atherosclerotic calcification involving the aorta, iliac arteries, and branch vessels but no aneurysm or dissection. The branch vessels are patent. The major venous structures are patent. LYMPH NODES: Small scattered lymph nodes are stable. No pelvic or inguinal adenopathy. REPRODUCTIVE ORGANS: The uterus is normal. No adnexal mass. BONES AND SOFT TISSUES: The bony thorax is intact. No acute osseous abnormality. No subcutaneous  lesions. No focal soft tissue abnormality. IMPRESSION: 1. Diffuse pancolitis, likely infectious or inflammatory etiology such as C. difficile colitis. 2. Diffuse and severe hepatic steatosis with hepatomegaly. 3. Moderate common bile duct dilation measuring 10 mm, chronic and unchanged. Electronically signed by: Maude Stammer MD 06/30/2024 04:28 PM EST RP Workstation: [id number]   DG Hand Complete Right Result Date: 06/30/2024 EXAM: 3 or more VIEW(S) XRAY OF THE HAND 06/30/2024 12:19:00 PM COMPARISON: 05/24/2023, 02/01/2024 CLINICAL HISTORY: Swelling and erythema to the DIP joint thumb and forefinger, gout, Crohn disease. FINDINGS: BONES AND JOINTS: Erosive changes of the index finger DIP joint with associated joint space loss. No acute fracture. No joint dislocation. SOFT TISSUES: Increased soft tissue swelling of the thumb and index finger compared to prior. IMPRESSION: 1. Erosive changes of the index finger DIP joint with associated joint space loss.given chronicity, favor inflammatory arthropathy or gout.infection cannot technically be excluded but it is felt less likely given chronicity. 2. Increased soft tissue swelling of the thumb and index finger compared to prior, likely related to the clinical history of gout. Electronically signed by: Rockey Kilts MD 06/30/2024 12:53 PM EST RP Workstation: [id number]    Recent Labs: Lab Results  Component Value Date   WBC 6.3 07/06/2024   HGB 10.8 (L) 07/06/2024   PLT 273 07/06/2024   NA 138 07/15/2024   K 4.3 07/15/2024   CL 103 07/15/2024   CO2 24 07/15/2024   GLUCOSE 97 07/15/2024   BUN 25 07/15/2024   CREATININE 0.88 07/15/2024   BILITOT 0.3 07/15/2024   ALKPHOS 150 (H) 07/01/2024   AST 120 (H) 07/15/2024   ALT 163 (H) 07/15/2024   PROT 7.1 07/15/2024   ALBUMIN 3.0 (L) 07/01/2024   CALCIUM 9.5 07/15/2024   GFRAA  05/15/2008    >60        The eGFR has been calculated using the MDRD equation. This calculation has not been validated in  all clinical    Speciality Comments: No specialty comments available.  Procedures:  No procedures performed Allergies: Penicillins   Assessment / Plan:     Visit Diagnoses: Tophaceous gout - 06/30/2024- Uric Acid 13.2 - Plan: Uric acid, Comprehensive metabolic panel with GFR Recent flare likely due to dehydration and gastrointestinal issues. Uric acid levels previously elevated, decreased with allopurinol . Current levels need re-evaluation for potential allopurinol  adjustment. Risk of flares persists for months despite effective medication. - Rechecked uric acid levels today. - Continue current allopurinol  dosage if uric acid  levels are below target. - Consider increasing allopurinol  to 300 mg if uric acid levels are above target.  Clostridioides difficile colitis Symptoms improved but diarrhea persists, possibly due to antibiotics.  Nausea and decreased appetite Persistent nausea and decreased appetite, possibly related to gastrointestinal issues and recent illness. Zofran  prescribed for nausea.       Orders: Orders Placed This Encounter  Procedures   Uric acid   Comprehensive metabolic panel with GFR   No orders of the defined types were placed in this encounter.    Follow-Up Instructions: Return in about 3 months (around 10/15/2024) for Gout on allopurinol  f/u 3mos.   Lonni LELON Ester, MD  Note - This record has been created using [employer].  Chart creation errors have been sought, but may not always  have been located. Such creation errors do not reflect on  the standard of medical care.

## 2024-07-01 NOTE — Progress Notes (Signed)
 Mobility Specialist Progress Note:   07/01/24 1130  Mobility  Activity Ambulated with assistance  Level of Assistance Contact guard assist, steadying assist  Assistive Device Cane  Distance Ambulated (ft) 60 ft  Activity Response Tolerated well  Mobility Referral Yes  Mobility visit 1 Mobility  Mobility Specialist Start Time (ACUTE ONLY) 1100  Mobility Specialist Stop Time (ACUTE ONLY) 1110  Mobility Specialist Time Calculation (min) (ACUTE ONLY) 10 min   Pt was received in bed and agreed to mobility. Pt had a brief bout of unsteadiness sit to stand but recovered quickly. No complaints with ambulation inside room. Returned to bed with all needs met. Call bell in reach. Left in room with RN.  [employer] - Mobility Specialist

## 2024-07-01 NOTE — Discharge Instructions (Signed)
 FOOD PANTRY [facility] [address] [phone]  [facility] [address] [place] [phone]  [facility] - Food Distribution Center [address] [place] [phone]  [facility] [address] [phone]  [facility] - Food Distribution Center [address] [place], [place] [address] [phone]  UTILITIES [facility] [address] [place] [phone] Rental assistance/rental hotline: [phone] ext. 340.    Utility assistance/utility hotline: [phone] ext. [phone] Department of IT consultant (heating/cooling and water assistance) [phone] (rental and utility assistance) [phone]  [employer] - call 211  TRANSPORTATION [facility] [address] [place], [place] [address] [phone]  [employer] [place] I-R[employer] Reservations Line: [phone]     Passengers can simply call the [employer] reservations number at [phone] for pickup. However, with [employer] same-day service is available with at least two hour notice Monday through Friday. You will need your [employer] [place] client ID# when you call for reservations. If you do not know it, you can call [phone] to request it. [employer] offers a flat fare of $8.50 per trip. This will cover travel anywhere within the city limits of [place].

## 2024-07-01 NOTE — Hospital Course (Signed)
 [name] is a 53 y.o. female with medical history significant of HTN, hx of polysubstance abuse in the past,  anxiety and depression, tophaceous gout, OSA, chronic pain syndrome on Subutex , pancreatic insufficiency with chronic nausea who presented to ED with complaints of right hand pain and diarrhea and vomiting.  She states her right hand fingers (thumb and pointer finger have swelling in the distal joints. She has known tophaceous gout and states the swelling and pain got worse over this past week and she couldn't take it anymore so came to hospital.  She also states she has chronic nausea and vomiting and diarrhea. She states this is about the same, but now she has been unable to eat or keep anything down. She is having about 6-7 watery stools/day, denies any blood and about 2-3 times of emesis daily. Having a hard time keeping down medication. Doesn't really endorse abdominal pain, but states has some mild pain in her epigastric area. Denies any fever or chills.  She typically only eats 1 meal a day and takes Creon  when doing so.  CT A/P was obtained on admission showing diffuse pancolitis concerning for infectious or inflammatory etiology.  Diffuse and severe hepatic steatosis. Right hand x-ray showed erosive changes of the index finger DIP with associated joint space loss and increased soft tissue swelling of the thumb and index finger.   Stool studies were ordered on admission.  She was also started on colchicine  and prednisone  for gout.

## 2024-07-01 NOTE — Progress Notes (Signed)
 Progress Note    [name]   FMW:[id number]  DOB: 01-22-1971  DOA: 06/30/2024     0 PCP: [facility]  Initial CC: nausea, right hand pain  Hospital Course: [name] is a 53 y.o. female with medical history significant of HTN, hx of polysubstance abuse in the past,  anxiety and depression, tophaceous gout, OSA, chronic pain syndrome on Subutex , pancreatic insufficiency with chronic nausea who presented to ED with complaints of right hand pain and diarrhea and vomiting.  She states her right hand fingers (thumb and pointer finger have swelling in the distal joints. She has known tophaceous gout and states the swelling and pain got worse over this past week and she couldn't take it anymore so came to hospital.  She also states she has chronic nausea and vomiting and diarrhea. She states this is about the same, but now she has been unable to eat or keep anything down. She is having about 6-7 watery stools/day, denies any blood and about 2-3 times of emesis daily. Having a hard time keeping down medication. Doesn't really endorse abdominal pain, but states has some mild pain in her epigastric area. Denies any fever or chills.  She typically only eats 1 meal a day and takes Creon  when doing so.  CT A/P was obtained on admission showing diffuse pancolitis concerning for infectious or inflammatory etiology.  Diffuse and severe hepatic steatosis. Right hand x-ray showed erosive changes of the index finger DIP with associated joint space loss and increased soft tissue swelling of the thumb and index finger.   Stool studies were ordered on admission.  She was also started on colchicine  and prednisone  for gout.  Interval History:  Resting in bed still with pain in her right hand.  Requesting more pain medication at that time. Ongoing nausea which she says precludes eating much at home, typically 1 meal a day and tries to take Creon  at that time.  Does not take many of her home  meds due to not eating.  Assessment and Plan: * Colitis - Ongoing nausea with chronic diarrhea and vomiting - Questionable compliance on Creon  also - CT A/P showing diffuse pancolitis - Follow-up stool studies - ESR and CRP negative - Seen by GI in December 2024 with recommendations for colonoscopy at that time and has not yet followed back up - Still needs outpatient colonoscopy after recovery from current episode  Tophaceous gout - Has been off her allopurinol  for unknown amount of time with alcohol use - Uric acid 13.2 -Significant right hand gout appreciated with tophi -Continue allopurinol  and prednisone   Hypokalemia - Replete as needed  Prolonged QT interval Optimize electrolytes Keep on telemetry Avoid qt prolonging drugs   Hypomagnesemia - Replete as needed  Abnormal LFTs (liver function tests) History of elevated LFTS (AST>ALT) appears to be alcohol related trend Admits to drinking 4x or more a week  Severe hepatic steatosis with hepatomegaly  Hepatitis panel negative Seen by general surgery with negative HIDA and no gallstones July 2025  Alcohol use Drinking at least 4x/week Vague about quantity No hx of withdrawals CIWA protocol Mv, thiamine  and folic acid    Benign essential hypertension Off her blood pressure medication for unknown amount of time  Start back norvasc  5mg  and lisinopril  20mg  daily   Dilated cbd, acquired -chronic, no change -negative HIDA per surgery note 02/2024   Chronic pain syndrome Continue subutex , gabapentin  and cymbalta PMP verified  Pancreatic insufficiency Continue creon    Generalized anxiety  disorder Continue cymbalta   History of heroin use - UDS only with opiates  Tobacco use Smoking 1PPD Nicotine  patch Encouraged cessation        Antimicrobials:   DVT prophylaxis:  enoxaparin  (LOVENOX ) injection 40 mg Start: 06/30/24 2200   Code Status:   Code Status: Full Code  Mobility Assessment (Last 72  Hours)     Mobility Assessment     Row Name 07/01/24 1015 06/30/24 2300         Does the patient have exclusion criteria? No - Perform mobility assessment No - Perform mobility assessment      What is the highest level of mobility based on the mobility assessment? Level 4 (Ambulates with assistance) - Balance while stepping forward/back - Complete Level 4 (Ambulates with assistance) - Balance while stepping forward/back - Complete         Diet: Diet Orders (From admission, onward)     Start     Ordered   06/30/24 2124  Diet clear liquid Room service appropriate? Yes; Fluid consistency: Thin  Diet effective now       Question Answer Comment  Room service appropriate? Yes   Fluid consistency: Thin      06/30/24 2125            Barriers to discharge: none Disposition Plan:  Home  HH orders placed: n/a Status is: Obs  Objective: Blood pressure (!) 146/100, pulse 89, temperature 98.8 F (37.1 C), resp. rate 15, height 5' 6 (1.676 m), weight 47.6 kg, SpO2 98%.  Examination:  Physical Exam Constitutional:      Appearance: Normal appearance.     Comments: Frail and uncomfortable appearing  HENT:     Head: Normocephalic and atraumatic.     Mouth/Throat:     Mouth: Mucous membranes are moist.  Eyes:     Extraocular Movements: Extraocular movements intact.  Cardiovascular:     Rate and Rhythm: Normal rate and regular rhythm.  Pulmonary:     Effort: Pulmonary effort is normal. No respiratory distress.     Breath sounds: Normal breath sounds. No wheezing.  Abdominal:     General: Bowel sounds are normal. There is no distension.     Palpations: Abdomen is soft.     Tenderness: There is no abdominal tenderness.  Musculoskeletal:     Cervical back: Normal range of motion and neck supple.     Comments: Right hand noted with significant swelling and signs of gouty tophi in 1st and 2nd digits  Skin:    General: Skin is warm and dry.  Neurological:     General: No focal  deficit present.     Mental Status: She is alert.  Psychiatric:        Mood and Affect: Mood normal.      Consultants:    Procedures:    Data Reviewed: Results for orders placed or performed during the hospital encounter of 06/30/24 (from the past 24 hours)  Ethanol     Status: None   Collection Time: 06/30/24  8:41 PM  Result Value Ref Range   Alcohol, Ethyl (B) <15 <15 mg/dL  Uric acid     Status: Abnormal   Collection Time: 06/30/24  8:41 PM  Result Value Ref Range   Uric Acid, Serum 13.2 (H) 2.5 - 7.1 mg/dL  Procalcitonin     Status: None   Collection Time: 06/30/24  8:41 PM  Result Value Ref Range   Procalcitonin <0.10 ng/mL  Lactic acid, plasma  Status: None   Collection Time: 06/30/24  8:41 PM  Result Value Ref Range   Lactic Acid, Venous 1.6 0.5 - 1.9 mmol/L  C-reactive protein     Status: None   Collection Time: 06/30/24  8:41 PM  Result Value Ref Range   CRP <0.5 <1.0 mg/dL  Sedimentation rate     Status: None   Collection Time: 06/30/24  8:41 PM  Result Value Ref Range   Sed Rate 3 0 - 22 mm/hr  Culture, blood (Routine X 2) w Reflex to ID Panel     Status: None (Preliminary result)   Collection Time: 06/30/24  8:41 PM   Specimen: Left Antecubital; Blood  Result Value Ref Range   Specimen Description      LEFT ANTECUBITAL BOTTLES DRAWN AEROBIC AND ANAEROBIC Performed at [facility], [address] [place], [place] [address]    Special Requests      Blood Culture adequate volume Performed at [facility], [address], [place], [place] [address]    Culture      NO GROWTH < 12 HOURS Performed at [facility], [address] [place], [place] [address]    Report Status PENDING   Culture, blood (Routine X 2) w Reflex to ID Panel     Status: None (Preliminary result)   Collection Time: 06/30/24  8:41 PM   Specimen: BLOOD RIGHT FOREARM  Result Value Ref Range   Specimen Description      BLOOD RIGHT  FOREARM BOTTLES DRAWN AEROBIC AND ANAEROBIC Performed at [facility], [address], [place], [place] [address]    Special Requests      Blood Culture adequate volume Performed at [facility], [address], [place], [place] [address]    Culture      NO GROWTH < 12 HOURS Performed at [facility], [address], [place], [place] [address]    Report Status PENDING   Troponin T, High Sensitivity     Status: None   Collection Time: 06/30/24  8:41 PM  Result Value Ref Range   Troponin T High Sensitivity 17 0 - 19 ng/L  Urinalysis, Routine w reflex microscopic -Urine, Clean Catch     Status: Abnormal   Collection Time: 06/30/24  9:08 PM  Result Value Ref Range   Color, Urine YELLOW YELLOW   APPearance CLEAR CLEAR   Specific Gravity, Urine 1.034 (H) 1.005 - 1.030   pH 7.0 5.0 - 8.0   Glucose, UA NEGATIVE NEGATIVE mg/dL   Hgb urine dipstick NEGATIVE NEGATIVE   Bilirubin Urine NEGATIVE NEGATIVE   Ketones, ur NEGATIVE NEGATIVE mg/dL   Protein, ur NEGATIVE NEGATIVE mg/dL   Nitrite NEGATIVE NEGATIVE   Leukocytes,Ua NEGATIVE NEGATIVE  Urine Drug Screen     Status: Abnormal   Collection Time: 06/30/24  9:08 PM  Result Value Ref Range   Opiates POSITIVE (A) NEGATIVE   Cocaine NEGATIVE NEGATIVE   Benzodiazepines NEGATIVE NEGATIVE   Amphetamines NEGATIVE NEGATIVE   Tetrahydrocannabinol NEGATIVE NEGATIVE   Barbiturates NEGATIVE NEGATIVE   Methadone Scn, Ur NEGATIVE NEGATIVE   Fentanyl NEGATIVE NEGATIVE  Lactic acid, plasma     Status: None   Collection Time: 06/30/24 10:30 PM  Result Value Ref Range   Lactic Acid, Venous 1.2 0.5 - 1.9 mmol/L  Hepatitis panel, acute     Status: None   Collection Time: 06/30/24 10:30 PM  Result Value Ref Range   Hepatitis  B Surface Ag NON REACTIVE NON REACTIVE   HCV Ab NON REACTIVE NON REACTIVE   Hep A IgM NON REACTIVE NON REACTIVE   Hep B C IgM NON REACTIVE NON REACTIVE  HIV Antibody (routine testing  w rflx)     Status: None   Collection Time: 06/30/24 10:30 PM  Result Value Ref Range   HIV Screen 4th Generation wRfx Non Reactive Non Reactive  Protime-INR     Status: None   Collection Time: 06/30/24 10:30 PM  Result Value Ref Range   Prothrombin Time 13.9 11.4 - 15.2 seconds   INR 1.0 0.8 - 1.2  Troponin T, High Sensitivity     Status: None   Collection Time: 06/30/24 10:30 PM  Result Value Ref Range   Troponin T High Sensitivity 18 0 - 19 ng/L  Basic metabolic panel     Status: Abnormal   Collection Time: 06/30/24 10:30 PM  Result Value Ref Range   Sodium 138 135 - 145 mmol/L   Potassium 3.0 (L) 3.5 - 5.1 mmol/L   Chloride 102 98 - 111 mmol/L   CO2 26 22 - 32 mmol/L   Glucose, Bld 119 (H) 70 - 99 mg/dL   BUN <5 (L) 6 - 20 mg/dL   Creatinine, Ser 9.17 0.44 - 1.00 mg/dL   Calcium 7.7 (L) 8.9 - 10.3 mg/dL   GFR, Estimated >39 >39 mL/min   Anion gap 11 5 - 15  Procalcitonin     Status: None   Collection Time: 07/01/24  5:51 AM  Result Value Ref Range   Procalcitonin <0.10 ng/mL  Magnesium      Status: None   Collection Time: 07/01/24  5:51 AM  Result Value Ref Range   Magnesium  1.9 1.7 - 2.4 mg/dL  Comprehensive metabolic panel     Status: Abnormal   Collection Time: 07/01/24  5:51 AM  Result Value Ref Range   Sodium 141 135 - 145 mmol/L   Potassium 3.4 (L) 3.5 - 5.1 mmol/L   Chloride 108 98 - 111 mmol/L   CO2 24 22 - 32 mmol/L   Glucose, Bld 137 (H) 70 - 99 mg/dL   BUN 6 6 - 20 mg/dL   Creatinine, Ser 9.24 0.44 - 1.00 mg/dL   Calcium 7.5 (L) 8.9 - 10.3 mg/dL   Total Protein 5.3 (L) 6.5 - 8.1 g/dL   Albumin 3.0 (L) 3.5 - 5.0 g/dL   AST 852 (H) 15 - 41 U/L   ALT 47 (H) 0 - 44 U/L   Alkaline Phosphatase 150 (H) 38 - 126 U/L   Total Bilirubin 1.0 0.0 - 1.2 mg/dL   GFR, Estimated >39 >39 mL/min   Anion gap 9 5 - 15  CBC     Status: Abnormal   Collection Time: 07/01/24  5:51 AM  Result Value Ref Range   WBC 5.1 4.0 - 10.5 K/uL   RBC 2.93 (L) 3.87 - 5.11 MIL/uL    Hemoglobin 10.2 (L) 12.0 - 15.0 g/dL   HCT 68.8 (L) 63.9 - 53.9 %   MCV 106.1 (H) 80.0 - 100.0 fL   MCH 34.8 (H) 26.0 - 34.0 pg   MCHC 32.8 30.0 - 36.0 g/dL   RDW 86.6 88.4 - 84.4 %   Platelets 202 150 - 400 K/uL   nRBC 0.0 0.0 - 0.2 %    I have reviewed pertinent nursing notes, vitals, labs, and images as necessary. I have ordered labwork to follow up on as indicated.  I  have reviewed the last notes from staff over past 24 hours. I have discussed patient's care plan and test results with nursing staff, CM/SW, and other staff as appropriate.  Old records reviewed in assessment of this patient  Time spent: Greater than 50% of the 55 minute visit was spent in counseling/coordination of care for the patient as laid out in the A&P.   LOS: 0 days   Alm Apo, MD [facility] 07/01/2024, 12:51 PM

## 2024-07-02 ENCOUNTER — Other Ambulatory Visit (HOSPITAL_COMMUNITY): Payer: Self-pay

## 2024-07-02 ENCOUNTER — Telehealth (HOSPITAL_COMMUNITY): Payer: Self-pay

## 2024-07-02 DIAGNOSIS — F1021 Alcohol dependence, in remission: Secondary | ICD-10-CM | POA: Diagnosis present

## 2024-07-02 DIAGNOSIS — F17210 Nicotine dependence, cigarettes, uncomplicated: Secondary | ICD-10-CM | POA: Diagnosis present

## 2024-07-02 DIAGNOSIS — Z8349 Family history of other endocrine, nutritional and metabolic diseases: Secondary | ICD-10-CM | POA: Diagnosis not present

## 2024-07-02 DIAGNOSIS — M79644 Pain in right finger(s): Secondary | ICD-10-CM | POA: Diagnosis present

## 2024-07-02 DIAGNOSIS — M19049 Primary osteoarthritis, unspecified hand: Secondary | ICD-10-CM | POA: Diagnosis present

## 2024-07-02 DIAGNOSIS — F1120 Opioid dependence, uncomplicated: Secondary | ICD-10-CM | POA: Diagnosis present

## 2024-07-02 DIAGNOSIS — Z87442 Personal history of urinary calculi: Secondary | ICD-10-CM | POA: Diagnosis not present

## 2024-07-02 DIAGNOSIS — Z880 Allergy status to penicillin: Secondary | ICD-10-CM | POA: Diagnosis not present

## 2024-07-02 DIAGNOSIS — K838 Other specified diseases of biliary tract: Secondary | ICD-10-CM | POA: Diagnosis present

## 2024-07-02 DIAGNOSIS — K760 Fatty (change of) liver, not elsewhere classified: Secondary | ICD-10-CM | POA: Diagnosis present

## 2024-07-02 DIAGNOSIS — F1191 Opioid use, unspecified, in remission: Secondary | ICD-10-CM | POA: Diagnosis present

## 2024-07-02 DIAGNOSIS — Z79899 Other long term (current) drug therapy: Secondary | ICD-10-CM | POA: Diagnosis not present

## 2024-07-02 DIAGNOSIS — A0472 Enterocolitis due to Clostridium difficile, not specified as recurrent: Secondary | ICD-10-CM | POA: Diagnosis present

## 2024-07-02 DIAGNOSIS — R160 Hepatomegaly, not elsewhere classified: Secondary | ICD-10-CM | POA: Diagnosis present

## 2024-07-02 DIAGNOSIS — E876 Hypokalemia: Secondary | ICD-10-CM | POA: Diagnosis present

## 2024-07-02 DIAGNOSIS — R7989 Other specified abnormal findings of blood chemistry: Secondary | ICD-10-CM | POA: Diagnosis present

## 2024-07-02 DIAGNOSIS — F411 Generalized anxiety disorder: Secondary | ICD-10-CM | POA: Diagnosis present

## 2024-07-02 DIAGNOSIS — Z23 Encounter for immunization: Secondary | ICD-10-CM | POA: Diagnosis not present

## 2024-07-02 DIAGNOSIS — I10 Essential (primary) hypertension: Secondary | ICD-10-CM | POA: Diagnosis present

## 2024-07-02 DIAGNOSIS — Z8719 Personal history of other diseases of the digestive system: Secondary | ICD-10-CM | POA: Diagnosis not present

## 2024-07-02 DIAGNOSIS — M1A9XX1 Chronic gout, unspecified, with tophus (tophi): Secondary | ICD-10-CM | POA: Diagnosis present

## 2024-07-02 DIAGNOSIS — G894 Chronic pain syndrome: Secondary | ICD-10-CM | POA: Diagnosis present

## 2024-07-02 DIAGNOSIS — R9431 Abnormal electrocardiogram [ECG] [EKG]: Secondary | ICD-10-CM | POA: Diagnosis present

## 2024-07-02 DIAGNOSIS — K8689 Other specified diseases of pancreas: Secondary | ICD-10-CM | POA: Diagnosis present

## 2024-07-02 LAB — CBC WITH DIFFERENTIAL/PLATELET
Abs Immature Granulocytes: 0.02 K/uL (ref 0.00–0.07)
Basophils Absolute: 0 K/uL (ref 0.0–0.1)
Basophils Relative: 1 %
Eosinophils Absolute: 0.1 K/uL (ref 0.0–0.5)
Eosinophils Relative: 1 %
HCT: 30.3 % — ABNORMAL LOW (ref 36.0–46.0)
Hemoglobin: 9.8 g/dL — ABNORMAL LOW (ref 12.0–15.0)
Immature Granulocytes: 0 %
Lymphocytes Relative: 25 %
Lymphs Abs: 1.6 K/uL (ref 0.7–4.0)
MCH: 35.9 pg — ABNORMAL HIGH (ref 26.0–34.0)
MCHC: 32.3 g/dL (ref 30.0–36.0)
MCV: 111 fL — ABNORMAL HIGH (ref 80.0–100.0)
Monocytes Absolute: 0.4 K/uL (ref 0.1–1.0)
Monocytes Relative: 6 %
Neutro Abs: 4.4 K/uL (ref 1.7–7.7)
Neutrophils Relative %: 67 %
Platelets: 199 K/uL (ref 150–400)
RBC: 2.73 MIL/uL — ABNORMAL LOW (ref 3.87–5.11)
RDW: 13.6 % (ref 11.5–15.5)
Smear Review: NORMAL
WBC: 6.5 K/uL (ref 4.0–10.5)
nRBC: 0 % (ref 0.0–0.2)

## 2024-07-02 LAB — GASTROINTESTINAL PANEL BY PCR, STOOL (REPLACES STOOL CULTURE)

## 2024-07-02 LAB — BASIC METABOLIC PANEL WITH GFR
Anion gap: 9 (ref 5–15)
BUN: <5 mg/dL — ABNORMAL LOW (ref 6–20)
CO2: 22 mmol/L (ref 22–32)
Calcium: 7.5 mg/dL — ABNORMAL LOW (ref 8.9–10.3)
Chloride: 110 mmol/L (ref 98–111)
Creatinine, Ser: 0.54 mg/dL (ref 0.44–1.00)
GFR, Estimated: >60 mL/min (ref >60)
Glucose, Bld: 82 mg/dL (ref 70–99)
Potassium: 3.4 mmol/L — ABNORMAL LOW (ref 3.5–5.1)
Sodium: 141 mmol/L (ref 135–145)

## 2024-07-02 LAB — MAGNESIUM: Magnesium: 1.3 mg/dL — ABNORMAL LOW (ref 1.7–2.4)

## 2024-07-02 MED ORDER — VANCOMYCIN HCL 125 MG PO CAPS
125.0000 mg | ORAL_CAPSULE | Freq: Four times a day (QID) | ORAL | Status: DC
  Administered 2024-07-02 – 2024-07-06 (×18): 125 mg via ORAL
  Filled 2024-07-02 (×19): qty 1

## 2024-07-02 MED ORDER — MAGNESIUM SULFATE 4 GM/100ML IV SOLN
4.0000 g | Freq: Once | INTRAVENOUS | Status: AC
  Administered 2024-07-02: 4 g via INTRAVENOUS
  Filled 2024-07-02: qty 100

## 2024-07-02 MED ORDER — POTASSIUM CHLORIDE CRYS ER 20 MEQ PO TBCR
40.0000 meq | EXTENDED_RELEASE_TABLET | Freq: Once | ORAL | Status: AC
  Administered 2024-07-02: 40 meq via ORAL
  Filled 2024-07-02: qty 2

## 2024-07-02 MED ORDER — INDOMETHACIN 25 MG PO CAPS
50.0000 mg | ORAL_CAPSULE | Freq: Three times a day (TID) | ORAL | Status: AC
  Administered 2024-07-02 – 2024-07-05 (×9): 50 mg via ORAL
  Filled 2024-07-02 (×10): qty 2

## 2024-07-02 NOTE — Telephone Encounter (Signed)
 Pharmacy Patient Advocate Encounter  Insurance verification completed.    The patient is insured through [place].     Ran test claim for Vancomycin 125mg  and the current 30 day co-pay is $4.  Ran test claim for Dificid 200mg  and it requires a PA   This test claim was processed through [employer]- copay amounts may vary at other pharmacies due to [employer], or as the patient moves through the different stages of their insurance plan.

## 2024-07-02 NOTE — Plan of Care (Signed)
  Problem: Health Behavior/Discharge Planning: Goal: Ability to manage health-related needs will improve Outcome: Progressing   Problem: Activity: Goal: Risk for activity intolerance will decrease Outcome: Progressing   Problem: Nutrition: Goal: Adequate nutrition will be maintained Outcome: Progressing   

## 2024-07-02 NOTE — Assessment & Plan Note (Addendum)
-   Ongoing nausea with chronic diarrhea and vomiting - Questionable compliance on Creon  also - CT A/P showing diffuse pancolitis - C. difficile testing positive.  Vancomycin started 11/11 - ESR and CRP negative - Seen by GI in December 2024 with recommendations for colonoscopy at that time and has not yet followed back up - Still needs outpatient colonoscopy after recovery from current episode

## 2024-07-02 NOTE — Progress Notes (Signed)
 Progress Note    [name]   FMW:[id number]  DOB: 01/12/1971  DOA: 06/30/2024     0 [facility]CP: [facility]  Initial CC: nausea, right hand pain  Hospital Course: [name] is a 53 y.o. female with medical history significant of HTN, hx of polysubstance abuse in the past,  anxiety and depression, tophaceous gout, OSA, chronic pain syndrome on Subutex , pancreatic insufficiency with chronic nausea who presented to ED with complaints of right hand pain and diarrhea and vomiting.  She states her right hand fingers (thumb and pointer finger have swelling in the distal joints. She has known tophaceous gout and states the swelling and pain got worse over this past week and she couldn't take it anymore so came to hospital.  She also states she has chronic nausea and vomiting and diarrhea. She states this is about the same, but now she has been unable to eat or keep anything down. She is having about 6-7 watery stools/day, denies any blood and about 2-3 times of emesis daily. Having a hard time keeping down medication. Doesn't really endorse abdominal pain, but states has some mild pain in her epigastric area. Denies any fever or chills.  She typically only eats 1 meal a day and takes Creon  when doing so.  CT A/P was obtained on admission showing diffuse pancolitis concerning for infectious or inflammatory etiology.  Diffuse and severe hepatic steatosis. Right hand x-ray showed erosive changes of the index finger DIP with associated joint space loss and increased soft tissue swelling of the thumb and index finger.   Stool studies were ordered on admission.  She was also started on colchicine  and prednisone  for gout.  Interval History:  Still having ongoing diarrhea and pain in her right hand. Pain regimen was adjusted yesterday with some relief but still ongoing swelling in right hand due to gout flare.  Assessment and Plan: * C. difficile colitis - Ongoing nausea with chronic  diarrhea and vomiting - Questionable compliance on Creon  also - CT A/P showing diffuse pancolitis - C. difficile testing positive.  Vancomycin started 11/11 - ESR and CRP negative - Seen by GI in December 2024 with recommendations for colonoscopy at that time and has not yet followed back up - Still needs outpatient colonoscopy after recovery from current episode  Tophaceous gout - Has been off her allopurinol  for unknown amount of time with alcohol use - Uric acid 13.2 -Significant right hand gout appreciated with tophi -Continue colchicine  and prednisone  - Still significant pain, add indomethacin  on 11/11 - Needs to be resumed on allopurinol  outpatient  Hypokalemia - Replete as needed  Prolonged QT interval Optimize electrolytes Keep on telemetry Avoid qt prolonging drugs   Hypomagnesemia - Replete as needed  Abnormal LFTs (liver function tests) History of elevated LFTS (AST>ALT) appears to be alcohol related trend Admits to drinking 4x or more a week  Severe hepatic steatosis with hepatomegaly  Hepatitis panel negative Seen by general surgery with negative HIDA and no gallstones July 2025  Alcohol use Drinking at least 4x/week Vague about quantity No hx of withdrawals CIWA protocol Mv, thiamine  and folic acid    Benign essential hypertension Off her blood pressure medication for unknown amount of time  Start back norvasc  5mg  and lisinopril  20mg  daily   Dilated cbd, acquired -chronic, no change -negative HIDA per surgery note 02/2024   Chronic pain syndrome Continue subutex , gabapentin  and cymbalta PMP verified  Pancreatic insufficiency Continue creon    Generalized anxiety disorder Continue  cymbalta   History of heroin use - UDS only with opiates  Tobacco use Smoking 1PPD Nicotine  patch Encouraged cessation    Antimicrobials:   DVT prophylaxis:  enoxaparin  (LOVENOX ) injection 40 mg Start: 06/30/24 2200   Code Status:   Code Status: Full  Code  Mobility Assessment (Last 72 Hours)     Mobility Assessment     Row Name 07/01/24 2100 07/01/24 1015 06/30/24 2300       Does the patient have exclusion criteria? No - Perform mobility assessment No - Perform mobility assessment No - Perform mobility assessment     What is the highest level of mobility based on the mobility assessment? Level 4 (Ambulates with assistance) - Balance while stepping forward/back - Complete Level 4 (Ambulates with assistance) - Balance while stepping forward/back - Complete Level 4 (Ambulates with assistance) - Balance while stepping forward/back - Complete        Diet: Diet Orders (From admission, onward)     Start     Ordered   07/01/24 1818  Diet regular Room service appropriate? Yes; Fluid consistency: Thin  Diet effective now       Question Answer Comment  Room service appropriate? Yes   Fluid consistency: Thin      07/01/24 1817            Barriers to discharge: none Disposition Plan:  Home  HH orders placed: n/a Status is: Obs  Objective: Blood pressure (!) 162/78, pulse 90, temperature 97.9 F (36.6 C), resp. rate 16, height 5' 6 (1.676 m), weight 47.6 kg, SpO2 99%.  Examination:  Physical Exam Constitutional:      Appearance: Normal appearance.     Comments: Frail and uncomfortable appearing  HENT:     Head: Normocephalic and atraumatic.     Mouth/Throat:     Mouth: Mucous membranes are moist.  Eyes:     Extraocular Movements: Extraocular movements intact.  Cardiovascular:     Rate and Rhythm: Normal rate and regular rhythm.  Pulmonary:     Effort: Pulmonary effort is normal. No respiratory distress.     Breath sounds: Normal breath sounds. No wheezing.  Abdominal:     General: Bowel sounds are normal. There is no distension.     Palpations: Abdomen is soft.     Tenderness: There is no abdominal tenderness.  Musculoskeletal:     Cervical back: Normal range of motion and neck supple.     Comments: Right hand  noted with significant swelling and signs of gouty tophi in 1st and 2nd digits  Skin:    General: Skin is warm and dry.  Neurological:     General: No focal deficit present.     Mental Status: She is alert.  Psychiatric:        Mood and Affect: Mood normal.   Pic taken 11/11:        Consultants:    Procedures:    Data Reviewed: Results for orders placed or performed during the hospital encounter of 06/30/24 (from the past 24 hours)  Basic metabolic panel with GFR     Status: Abnormal   Collection Time: 07/02/24  6:58 AM  Result Value Ref Range   Sodium 141 135 - 145 mmol/L   Potassium 3.4 (L) 3.5 - 5.1 mmol/L   Chloride 110 98 - 111 mmol/L   CO2 22 22 - 32 mmol/L   Glucose, Bld 82 70 - 99 mg/dL   BUN <5 (L) 6 - 20 mg/dL  Creatinine, Ser 0.54 0.44 - 1.00 mg/dL   Calcium 7.5 (L) 8.9 - 10.3 mg/dL   GFR, Estimated >39 >39 mL/min   Anion gap 9 5 - 15  CBC with Differential/Platelet     Status: Abnormal   Collection Time: 07/02/24  6:58 AM  Result Value Ref Range   WBC 6.5 4.0 - 10.5 K/uL   RBC 2.73 (L) 3.87 - 5.11 MIL/uL   Hemoglobin 9.8 (L) 12.0 - 15.0 g/dL   HCT 69.6 (L) 63.9 - 53.9 %   MCV 111.0 (H) 80.0 - 100.0 fL   MCH 35.9 (H) 26.0 - 34.0 pg   MCHC 32.3 30.0 - 36.0 g/dL   RDW 86.3 88.4 - 84.4 %   Platelets 199 150 - 400 K/uL   nRBC 0.0 0.0 - 0.2 %   Neutrophils Relative % 67 %   Neutro Abs 4.4 1.7 - 7.7 K/uL   Lymphocytes Relative 25 %   Lymphs Abs 1.6 0.7 - 4.0 K/uL   Monocytes Relative 6 %   Monocytes Absolute 0.4 0.1 - 1.0 K/uL   Eosinophils Relative 1 %   Eosinophils Absolute 0.1 0.0 - 0.5 K/uL   Basophils Relative 1 %   Basophils Absolute 0.0 0.0 - 0.1 K/uL   WBC Morphology MORPHOLOGY UNREMARKABLE    RBC Morphology MORPHOLOGY UNREMARKABLE    Smear Review Normal platelet morphology    Immature Granulocytes 0 %   Abs Immature Granulocytes 0.02 0.00 - 0.07 K/uL  Magnesium      Status: Abnormal   Collection Time: 07/02/24  6:58 AM  Result Value  Ref Range   Magnesium  1.3 (L) 1.7 - 2.4 mg/dL    I have reviewed pertinent nursing notes, vitals, labs, and images as necessary. I have ordered labwork to follow up on as indicated.  I have reviewed the last notes from staff over past 24 hours. I have discussed patient's care plan and test results with nursing staff, CM/SW, and other staff as appropriate.  Old records reviewed in assessment of this patient  Time spent: Greater than 50% of the 55 minute visit was spent in counseling/coordination of care for the patient as laid out in the A&P.   LOS: 0 days   Alm Apo, MD [facility] 07/02/2024, 1:25 PM

## 2024-07-02 NOTE — TOC Initial Note (Signed)
 Transition of Care [facility]) - Initial/Assessment Note    Patient Details  Name: [name] MRN: [id number] Date of Birth: 1971-07-15  Transition of Care [facility]) CM/SW Contact:    Heather DELENA Saltness, LCSW Phone Number: 07/02/2024, 11:50 AM  Clinical Narrative:                 TOC consulted for substance abuse counseling/education resources. Pt admitted to hospital due to hand pain and diarrhea/vomiting. Pt is from home with significant other. Pt is modified independent, uses cane and walker to ambulate at baseline. Per chart review, pt drinks alcohol 4x per week. SUD counseling resources added to AVS. TOC will continue to follow.  Expected Discharge Plan: Home/Self Care Barriers to Discharge: Continued Medical Work up   Patient Goals and CMS Choice Patient states their goals for this hospitalization and ongoing recovery are:: To return home        Expected Discharge Plan and Services In-house Referral: Clinical Social Work Discharge Planning Services: NA Post Acute Care Choice: NA Living arrangements for the past 2 months: Single Family Home                 DME Arranged: N/A DME Agency: NA       HH Arranged: NA HH Agency: NA        Prior Living Arrangements/Services Living arrangements for the past 2 months: Single Family Home Lives with:: Self, Significant Other Patient language and need for interpreter reviewed:: Yes Do you feel safe going back to the place where you live?: Yes      Need for Family Participation in Patient Care: Yes (Comment) Care giver support system in place?: Yes (comment) Current home services: DME Criminal Activity/Legal Involvement Pertinent to Current Situation/Hospitalization: No - Comment as needed  Activities of Daily Living   ADL Screening (condition at time of admission) Independently performs ADLs?: Yes (appropriate for developmental age) Is the patient deaf or have difficulty hearing?: No Does the patient have difficulty seeing,  even when wearing glasses/contacts?: No Does the patient have difficulty concentrating, remembering, or making decisions?: No  Permission Sought/Granted Permission sought to share information with : Family Supports Permission granted to share information with : Yes, Verbal Permission Granted  Share Information with NAME: Scottie Marland     Permission granted to share info w Relationship: Significant Other  Permission granted to share info w Contact Information: [phone]  Emotional Assessment Appearance:: Appears stated age Attitude/Demeanor/Rapport: Unable to Assess Affect (typically observed): Unable to Assess Orientation: : Oriented to Self, Oriented to Place, Oriented to  Time, Oriented to Situation Alcohol / Substance Use: Alcohol Use Psych Involvement: No (comment)  Admission diagnosis:  Hypokalemia [E87.6] Hypomagnesemia [E83.42] Colitis [K52.9] Pancolitis [K52.9] Gouty arthritis of hand [M10.9] C. difficile colitis [A04.72] Patient Active Problem List   Diagnosis Date Noted   C. difficile colitis 07/02/2024   Pancreatic insufficiency 06/30/2024   Colitis 06/30/2024   Recurrent kidney stones 02/01/2024   Tophaceous gout 02/01/2024   Hypocalcemia 08/03/2023   Protein-calorie malnutrition, severe 08/03/2023   Hypokalemia 08/02/2023   Hypophosphatemia 08/02/2023   Abnormal LFTs (liver function tests) 08/02/2023   Macrocytic anemia 08/02/2023   Nodule of finger of both hands 08/02/2023   Prolonged QT interval 05/24/2023   Tobacco use 05/24/2023   Acquired deformity of joint of finger of right hand 05/24/2023   Hypomagnesemia 01/21/2023   Malnutrition of moderate degree 09/16/2022   Alcohol use 09/12/2022   Major depressive disorder    Generalized anxiety disorder  History of heroin use    Crohn's disease - self reported. no biopsy proving Crohn's disease. small biopsy on 03-18-2003    Obstructive sleep apnea    Gout 04/09/2021   Dilated cbd, acquired  04/09/2021   Chronic pain syndrome 04/09/2021   Benign essential hypertension 04/09/2021   Alcohol dependence in sustained full remission    PCP:  [facility]:   [facility] [address], [address] [place] [address] Phone: [phone] Fax: [phone]  DARRYLE LONG - [facility] [address] [place] [address] Phone: [phone] Fax: [phone]   Social Drivers of Health (SDOH) Social History: SDOH Screenings   Food Insecurity: Food Insecurity Present (06/30/2024)  Housing: High Risk (06/30/2024)  Transportation Needs: Unmet Transportation Needs (06/30/2024)  Utilities: Not At Risk (06/30/2024)  Alcohol Screen: Low Risk  (04/22/2021)  Depression (PHQ2-9): Low Risk  (01/04/2022)  Tobacco Use: High Risk (06/30/2024)   SDOH Interventions: None     Readmission Risk Interventions    07/02/2024   11:42 AM 08/05/2023   10:22 AM  Readmission Risk Prevention Plan  Transportation Screening Complete Complete  PCP or Specialist Appt within 5-7 Days Complete Complete  Home Care Screening Complete Complete  Medication Review (RN CM) Complete Complete    Signed: Heather Saltness, MSW, LCSW Clinical Social Worker Inpatient Care Management 07/02/2024 3:07 PM

## 2024-07-03 DIAGNOSIS — A0472 Enterocolitis due to Clostridium difficile, not specified as recurrent: Secondary | ICD-10-CM | POA: Diagnosis not present

## 2024-07-03 LAB — CBC WITH DIFFERENTIAL/PLATELET
Abs Immature Granulocytes: 0.02 K/uL (ref 0.00–0.07)
Basophils Absolute: 0 K/uL (ref 0.0–0.1)
Basophils Relative: 0 %
Eosinophils Absolute: 0.1 K/uL (ref 0.0–0.5)
Eosinophils Relative: 1 %
HCT: 30.8 % — ABNORMAL LOW (ref 36.0–46.0)
Hemoglobin: 9.9 g/dL — ABNORMAL LOW (ref 12.0–15.0)
Immature Granulocytes: 0 %
Lymphocytes Relative: 25 %
Lymphs Abs: 1.5 K/uL (ref 0.7–4.0)
MCH: 35.1 pg — ABNORMAL HIGH (ref 26.0–34.0)
MCHC: 32.1 g/dL (ref 30.0–36.0)
MCV: 109.2 fL — ABNORMAL HIGH (ref 80.0–100.0)
Monocytes Absolute: 0.4 K/uL (ref 0.1–1.0)
Monocytes Relative: 7 %
Neutro Abs: 4.1 K/uL (ref 1.7–7.7)
Neutrophils Relative %: 67 %
Platelets: 214 K/uL (ref 150–400)
RBC: 2.82 MIL/uL — ABNORMAL LOW (ref 3.87–5.11)
RDW: 13 % (ref 11.5–15.5)
WBC: 6.1 K/uL (ref 4.0–10.5)
nRBC: 0 % (ref 0.0–0.2)

## 2024-07-03 LAB — BASIC METABOLIC PANEL WITH GFR
Anion gap: 7 (ref 5–15)
BUN: 7 mg/dL (ref 6–20)
CO2: 24 mmol/L (ref 22–32)
Calcium: 8.4 mg/dL — ABNORMAL LOW (ref 8.9–10.3)
Chloride: 109 mmol/L (ref 98–111)
Creatinine, Ser: 0.6 mg/dL (ref 0.44–1.00)
GFR, Estimated: >60 mL/min (ref >60)
Glucose, Bld: 105 mg/dL — ABNORMAL HIGH (ref 70–99)
Potassium: 4.5 mmol/L (ref 3.5–5.1)
Sodium: 140 mmol/L (ref 135–145)

## 2024-07-03 LAB — MAGNESIUM: Magnesium: 2.4 mg/dL (ref 1.7–2.4)

## 2024-07-03 MED ORDER — VITAMIN C 500 MG PO TABS
500.0000 mg | ORAL_TABLET | Freq: Every day | ORAL | Status: DC
  Administered 2024-07-03 – 2024-07-06 (×4): 500 mg via ORAL
  Filled 2024-07-03 (×4): qty 1

## 2024-07-03 MED ORDER — ENSURE PLUS HIGH PROTEIN PO LIQD
237.0000 mL | Freq: Three times a day (TID) | ORAL | Status: DC
  Administered 2024-07-03 – 2024-07-06 (×7): 237 mL via ORAL

## 2024-07-03 NOTE — Progress Notes (Signed)
 Progress Note    [name]   FMW:[id number]  DOB: Apr 21, 1971  DOA: 06/30/2024     1 PCP: [facility]  Initial CC: nausea, right hand pain  Hospital Course: [name] is a 53 y.o. female with medical history significant of HTN, hx of polysubstance abuse in the past,  anxiety and depression, tophaceous gout, OSA, chronic pain syndrome on Subutex , pancreatic insufficiency with chronic nausea who presented to ED with complaints of right hand pain and diarrhea and vomiting.  She states her right hand fingers (thumb and pointer finger have swelling in the distal joints. She has known tophaceous gout and states the swelling and pain got worse over this past week and she couldn't take it anymore so came to hospital.  She also states she has chronic nausea and vomiting and diarrhea. She states this is about the same, but now she has been unable to eat or keep anything down. She is having about 6-7 watery stools/day, denies any blood and about 2-3 times of emesis daily. Having a hard time keeping down medication. Doesn't really endorse abdominal pain, but states has some mild pain in her epigastric area. Denies any fever or chills.  She typically only eats 1 meal a day and takes Creon  when doing so.  CT A/P was obtained on admission showing diffuse pancolitis concerning for infectious or inflammatory etiology.  Diffuse and severe hepatic steatosis. Right hand x-ray showed erosive changes of the index finger DIP with associated joint space loss and increased soft tissue swelling of the thumb and index finger.   Stool studies were ordered on admission.  She was also started on colchicine  and prednisone  for gout.  Interval History:  Seen at bedside this morning, reports improved appetite, she states that her stools are formed now.  No further diarrhea episodes.  Continues to complain of pain in the right fingers due to gout flareup. Assessment and Plan: * C. difficile colitis -  Resolving nausea vomiting, diarrhea has improved - Questionable compliance on Creon  also - CT A/P showing diffuse pancolitis - C. difficile testing positive.  Vancomycin started 11/11 - ESR and CRP negative - Seen by GI in December 2024 with recommendations for colonoscopy at that time and has not yet followed back up - Still needs outpatient colonoscopy after recovery from current episode  Tophaceous gout - Has been off her allopurinol  for unknown amount of time with alcohol use - Uric acid 13.2 -Significant right hand gout appreciated with tophi -Continue colchicine  and prednisone  - Still significant pain, add indomethacin  on 11/11 - Needs to be resumed on allopurinol  outpatient  Hypokalemia - Replete as needed  Prolonged QT interval Optimize electrolytes Keep on telemetry Avoid qt prolonging drugs   Hypomagnesemia - Replete as needed  Abnormal LFTs (liver function tests) History of elevated LFTS (AST>ALT) appears to be alcohol related trend Admits to drinking 4x or more a week  Severe hepatic steatosis with hepatomegaly  Hepatitis panel negative Seen by general surgery with negative HIDA and no gallstones July 2025  Alcohol use Drinking at least 4x/week Vague about quantity No hx of withdrawals CIWA protocol Mv, thiamine  and folic acid    Benign essential hypertension Off her blood pressure medication for unknown amount of time  Start back norvasc  5mg  and lisinopril  20mg  daily   Dilated cbd, acquired -chronic, no change -negative HIDA per surgery note 02/2024   Chronic pain syndrome Continue subutex , gabapentin  and cymbalta PMP verified  Pancreatic insufficiency Continue creon   Generalized anxiety disorder Continue cymbalta   History of heroin use - UDS only with opiates  Tobacco use Smoking 1PPD Nicotine  patch Encouraged cessation    Antimicrobials:   DVT prophylaxis:  enoxaparin  (LOVENOX ) injection 40 mg Start: 06/30/24 2200   Code  Status:   Code Status: Full Code  Mobility Assessment (Last 72 Hours)     Mobility Assessment     Row Name 07/02/24 2122 07/02/24 1616 07/01/24 2100 07/01/24 1015 06/30/24 2300   Does the patient have exclusion criteria? No - Perform mobility assessment No - Perform mobility assessment No - Perform mobility assessment No - Perform mobility assessment No - Perform mobility assessment   What is the highest level of mobility based on the mobility assessment? Level 4 (Ambulates with assistance) - Balance while stepping forward/back - Complete Level 4 (Ambulates with assistance) - Balance while stepping forward/back - Complete Level 4 (Ambulates with assistance) - Balance while stepping forward/back - Complete Level 4 (Ambulates with assistance) - Balance while stepping forward/back - Complete Level 4 (Ambulates with assistance) - Balance while stepping forward/back - Complete      Diet: Diet Orders (From admission, onward)     Start     Ordered   07/01/24 1818  Diet regular Room service appropriate? Yes; Fluid consistency: Thin  Diet effective now       Question Answer Comment  Room service appropriate? Yes   Fluid consistency: Thin      07/01/24 1817            Barriers to discharge: none Disposition Plan:  Home  HH orders placed: n/a Status is: Obs  Objective: Blood pressure (!) 168/107, pulse 80, temperature 99.1 F (37.3 C), resp. rate 18, height 5' 6 (1.676 m), weight 47.6 kg, SpO2 99%.  Examination:  Physical Exam Constitutional:      Appearance: Normal appearance.     Comments: Frail and uncomfortable appearing  HENT:     Head: Normocephalic and atraumatic.     Mouth/Throat:     Mouth: Mucous membranes are moist.  Eyes:     Extraocular Movements: Extraocular movements intact.  Cardiovascular:     Rate and Rhythm: Normal rate and regular rhythm.  Pulmonary:     Effort: Pulmonary effort is normal. No respiratory distress.     Breath sounds: Normal breath sounds.  No wheezing.  Abdominal:     General: Bowel sounds are normal. There is no distension.     Palpations: Abdomen is soft.     Tenderness: There is no abdominal tenderness.  Musculoskeletal:     Cervical back: Normal range of motion and neck supple.     Comments: Right hand noted with significant swelling and signs of gouty tophi in 1st and 2nd digits  Skin:    General: Skin is warm and dry.  Neurological:     General: No focal deficit present.     Mental Status: She is alert.  Psychiatric:        Mood and Affect: Mood normal.   Pic taken 11/11:        Consultants:    Procedures:    Data Reviewed: Results for orders placed or performed during the hospital encounter of 06/30/24 (from the past 24 hours)  Basic metabolic panel with GFR     Status: Abnormal   Collection Time: 07/03/24  6:07 AM  Result Value Ref Range   Sodium 140 135 - 145 mmol/L   Potassium 4.5 3.5 - 5.1 mmol/L  Chloride 109 98 - 111 mmol/L   CO2 24 22 - 32 mmol/L   Glucose, Bld 105 (H) 70 - 99 mg/dL   BUN 7 6 - 20 mg/dL   Creatinine, Ser 9.39 0.44 - 1.00 mg/dL   Calcium 8.4 (L) 8.9 - 10.3 mg/dL   GFR, Estimated >39 >39 mL/min   Anion gap 7 5 - 15  CBC with Differential/Platelet     Status: Abnormal   Collection Time: 07/03/24  6:07 AM  Result Value Ref Range   WBC 6.1 4.0 - 10.5 K/uL   RBC 2.82 (L) 3.87 - 5.11 MIL/uL   Hemoglobin 9.9 (L) 12.0 - 15.0 g/dL   HCT 69.1 (L) 63.9 - 53.9 %   MCV 109.2 (H) 80.0 - 100.0 fL   MCH 35.1 (H) 26.0 - 34.0 pg   MCHC 32.1 30.0 - 36.0 g/dL   RDW 86.9 88.4 - 84.4 %   Platelets 214 150 - 400 K/uL   nRBC 0.0 0.0 - 0.2 %   Neutrophils Relative % 67 %   Neutro Abs 4.1 1.7 - 7.7 K/uL   Lymphocytes Relative 25 %   Lymphs Abs 1.5 0.7 - 4.0 K/uL   Monocytes Relative 7 %   Monocytes Absolute 0.4 0.1 - 1.0 K/uL   Eosinophils Relative 1 %   Eosinophils Absolute 0.1 0.0 - 0.5 K/uL   Basophils Relative 0 %   Basophils Absolute 0.0 0.0 - 0.1 K/uL   Immature  Granulocytes 0 %   Abs Immature Granulocytes 0.02 0.00 - 0.07 K/uL  Magnesium      Status: None   Collection Time: 07/03/24  6:07 AM  Result Value Ref Range   Magnesium  2.4 1.7 - 2.4 mg/dL    I have reviewed pertinent nursing notes, vitals, labs, and images as necessary. I have ordered labwork to follow up on as indicated.  I have reviewed the last notes from staff over past 24 hours. I have discussed patient's care plan and test results with nursing staff, CM/SW, and other staff as appropriate.  Old records reviewed in assessment of this patient  Time spent: Greater than 50% of the 55 minute visit was spent in counseling/coordination of care for the patient as laid out in the A&P.   LOS: 1 day   Landon FORBES Baller, MD [facility] 07/03/2024, 11:06 AM

## 2024-07-03 NOTE — Plan of Care (Signed)
  Problem: Health Behavior/Discharge Planning: Goal: Ability to manage health-related needs will improve Outcome: Progressing   Problem: Nutrition: Goal: Adequate nutrition will be maintained Outcome: Progressing   Problem: Pain Managment: Goal: General experience of comfort will improve and/or be controlled Outcome: Progressing

## 2024-07-03 NOTE — Progress Notes (Signed)
 Mobility Specialist Progress Note:   07/03/24 0948  Mobility  Activity Ambulated with assistance  Level of Assistance Contact guard assist, steadying assist  Assistive Device Cane  Distance Ambulated (ft) 55 ft  Activity Response Tolerated fair  Mobility Referral Yes  Mobility visit 1 Mobility  Mobility Specialist Start Time (ACUTE ONLY) [id number]  Mobility Specialist Stop Time (ACUTE ONLY) 0936  Mobility Specialist Time Calculation (min) (ACUTE ONLY) 12 min   Pt was received in bed and agreed to mobility. Pt stated being lightheaded but quickly subsided once standing. CGA ambulation in room. Returned to bed with all needs met. Call bell in reach.  [employer] - Mobility Specialist

## 2024-07-03 NOTE — Progress Notes (Signed)
 Initial Nutrition Assessment  DOCUMENTATION CODES:   Severe malnutrition in context of social or environmental circumstances, Underweight  INTERVENTION:   -Ensure Plus High Protein po TID, each supplement provides 350 kcal and 20 grams of protein.  -Recommend Creon  coverage for supplements  -Continue vitamin supplementation via CIWA, MVI, thiamine , folic acid   -500 mg Vitamin C  daily  -High Calorie, High Protein handout in AVS  -Providing Ensure coupons  NUTRITION DIAGNOSIS:   Severe Malnutrition related to chronic illness, social / environmental circumstances (food insecurity, h/o alcoholism) as evidenced by severe fat depletion, severe muscle depletion, energy intake < or equal to 75% for > or equal to 1 month.  GOAL:   Patient will meet greater than or equal to 90% of their needs  MONITOR:   PO intake, Supplement acceptance  REASON FOR ASSESSMENT:   Malnutrition Screening Tool    ASSESSMENT:   53 y.o. female with medical history significant of HTN, hx of polysubstance abuse in the past,  anxiety and depression, tophaceous gout, OSA, chronic pain syndrome on Subutex , pancreatic insufficiency with chronic nausea who presented to ED with complaints of right hand pain and diarrhea and vomiting.   She states her right hand fingers (thumb and pointer finger have swelling in the distal joints. She has known tophaceous gout and states the swelling and pain got worse over this past week and she couldn't take it anymore so came to hospital.  Patient in room, reports eating fine here. Consuming ~50% of meals.  States she typically only consumes 1 meal a day and that may be a guacamole snack cup. Drinks ginger ale in the morning. Reports taking her creon  at home with her one meal a day. Was advised to take for anything she consumes. Pt states she has very watery stool, sometimes with undigested food in it. Encouraged she take Creon  consistently, and will need it for milk and protein  supplements as well. Pt likes vanilla Ensure only and doesn't like the Boost Breeze that is ordered. Have switched this order. Pt reports not being able to drive given her current condition and she has memory issues. Pt wanting to know if her insurance will cover transportation to grocery store as well. Noted transportation information has been provided in AVS for patient. Advised pt to try to look into ordering groceries online too and see if SO can also bring her items. Per pt report, SO may not be a reliable person for this. She reports that her house is a mess as she has not been able to clean it properly and she cannot rely on SO to clean it either.  Pt was advised to continue vitamin supplementation when she discharges. States she takes vitamin D  and iron at home. Recommend vitamin C  with her iron to aid absorption and pt to take a daily MVI as well to cover other vitamins. Pt reports not being able to swallow large pills at times. Recommended crushing vitamins if needed.  Per MD note, pt reported continuing to drink at home. Pt reports to this RD she is clean. Recommended stopping drinking to better her health and nutrition status.   Per weight records, pt's weight has stayed between 97-109 lbs. Current weight: 105 lbs  Medications: folic acid , Creon , Multivitamin with minerals daily, Prednisone , Thiamine   Labs reviewed: UDS+ opiates   NUTRITION - FOCUSED PHYSICAL EXAM:  Flowsheet Row Most Recent Value  Orbital Region Severe depletion  Upper Arm Region Severe depletion  Thoracic and Lumbar Region Severe depletion  Buccal Region Moderate depletion  Temple Region Severe depletion  Clavicle Bone Region Severe depletion  Clavicle and Acromion Bone Region Severe depletion  Scapular Bone Region Severe depletion  Dorsal Hand Severe depletion  Patellar Region Severe depletion  Anterior Thigh Region Severe depletion  Posterior Calf Region Severe depletion  Edema (RD Assessment) None  Hair  Reviewed  Eyes Reviewed  Mouth Reviewed  [poor dentition]  Skin Reviewed  [echhymosis, blister r/t gout on right hand]  Nails Reviewed    Diet Order:   Diet Order             Diet regular Room service appropriate? Yes; Fluid consistency: Thin  Diet effective now                   EDUCATION NEEDS:   Education needs have been addressed  Skin:  Skin Assessment: Skin Integrity Issues: Skin Integrity Issues:: Other (Comment) Other: tophaceous gout of right hand  Last BM:  11/12  Height:   Ht Readings from Last 1 Encounters:  06/30/24 5' 6 (1.676 m)    Weight:   Wt Readings from Last 1 Encounters:  06/30/24 47.6 kg    BMI:  Body mass index is 16.95 kg/m.  Estimated Nutritional Needs:   Kcal:  1700-1900  Protein:  85-100g  Fluid:  1.9L/day   Morna Lee, MS, RD, LDN Inpatient Clinical Dietitian Contact via Secure chat

## 2024-07-04 DIAGNOSIS — A0472 Enterocolitis due to Clostridium difficile, not specified as recurrent: Secondary | ICD-10-CM | POA: Diagnosis not present

## 2024-07-04 LAB — CBC WITH DIFFERENTIAL/PLATELET
Abs Immature Granulocytes: 0.01 K/uL (ref 0.00–0.07)
Basophils Absolute: 0 K/uL (ref 0.0–0.1)
Basophils Relative: 0 %
Eosinophils Absolute: 0.1 K/uL (ref 0.0–0.5)
Eosinophils Relative: 2 %
HCT: 28.5 % — ABNORMAL LOW (ref 36.0–46.0)
Hemoglobin: 9.4 g/dL — ABNORMAL LOW (ref 12.0–15.0)
Immature Granulocytes: 0 %
Lymphocytes Relative: 20 %
Lymphs Abs: 1 K/uL (ref 0.7–4.0)
MCH: 35.9 pg — ABNORMAL HIGH (ref 26.0–34.0)
MCHC: 33 g/dL (ref 30.0–36.0)
MCV: 108.8 fL — ABNORMAL HIGH (ref 80.0–100.0)
Monocytes Absolute: 0.4 K/uL (ref 0.1–1.0)
Monocytes Relative: 7 %
Neutro Abs: 3.6 K/uL (ref 1.7–7.7)
Neutrophils Relative %: 71 %
Platelets: 214 K/uL (ref 150–400)
RBC: 2.62 MIL/uL — ABNORMAL LOW (ref 3.87–5.11)
RDW: 13.3 % (ref 11.5–15.5)
WBC: 5.1 K/uL (ref 4.0–10.5)
nRBC: 0 % (ref 0.0–0.2)

## 2024-07-04 LAB — BASIC METABOLIC PANEL WITH GFR
Anion gap: 7 (ref 5–15)
BUN: 15 mg/dL (ref 6–20)
CO2: 25 mmol/L (ref 22–32)
Calcium: 8.6 mg/dL — ABNORMAL LOW (ref 8.9–10.3)
Chloride: 107 mmol/L (ref 98–111)
Creatinine, Ser: 0.66 mg/dL (ref 0.44–1.00)
GFR, Estimated: >60 mL/min (ref >60)
Glucose, Bld: 102 mg/dL — ABNORMAL HIGH (ref 70–99)
Potassium: 4.4 mmol/L (ref 3.5–5.1)
Sodium: 139 mmol/L (ref 135–145)

## 2024-07-04 LAB — MAGNESIUM: Magnesium: 1.8 mg/dL (ref 1.7–2.4)

## 2024-07-04 LAB — URIC ACID: Uric Acid, Serum: 5.4 mg/dL (ref 2.5–7.1)

## 2024-07-04 MED ORDER — HYDROXYZINE HCL 25 MG PO TABS
50.0000 mg | ORAL_TABLET | ORAL | Status: DC | PRN
  Administered 2024-07-04 – 2024-07-06 (×6): 50 mg via ORAL
  Filled 2024-07-04 (×6): qty 2

## 2024-07-04 NOTE — Progress Notes (Signed)
 Progress Note    [name]   FMW:[id number]  DOB: 08/18/1971  DOA: 06/30/2024     2 PCP: [facility]  Initial CC: nausea, right hand pain  Hospital Course: [name] is a 53 y.o. female with medical history significant of HTN, hx of polysubstance abuse in the past,  anxiety and depression, tophaceous gout, OSA, chronic pain syndrome on Subutex , pancreatic insufficiency with chronic nausea who presented to ED with complaints of right hand pain and diarrhea and vomiting.  She states her right hand fingers (thumb and pointer finger have swelling in the distal joints. She has known tophaceous gout and states the swelling and pain got worse over this past week and she couldn't take it anymore so came to hospital.  She also states she has chronic nausea and vomiting and diarrhea. She states this is about the same, but now she has been unable to eat or keep anything down. She is having about 6-7 watery stools/day, denies any blood and about 2-3 times of emesis daily. Having a hard time keeping down medication. Doesn't really endorse abdominal pain, but states has some mild pain in her epigastric area. Denies any fever or chills.  She typically only eats 1 meal a day and takes Creon  when doing so.  CT A/P was obtained on admission showing diffuse pancolitis concerning for infectious or inflammatory etiology.  Diffuse and severe hepatic steatosis. Right hand x-ray showed erosive changes of the index finger DIP with associated joint space loss and increased soft tissue swelling of the thumb and index finger.   Stool studies were ordered on admission.  She was also started on colchicine  and prednisone  for gout.  Interval History:  Seen at bedside this morning, reports improved appetite, she states that her stools are formed now.  No further diarrhea episodes.  Continues to complain of pain in the right fingers due to gout flareup. Assessment and Plan: * C. difficile colitis -  Resolving nausea vomiting, diarrhea has improved - Questionable compliance on Creon  also - CT A/P showing diffuse pancolitis - C. difficile testing positive.  Vancomycin started 11/11 - ESR and CRP negative - Seen by GI in December 2024 with recommendations for colonoscopy at that time and has not yet followed back up - Still needs outpatient colonoscopy after recovery from current episode  Tophaceous gout - Has been off her allopurinol  for unknown amount of time with alcohol use - Uric acid 13.2---. Improved to 5.4 -Significant right hand gout appreciated with tophi -Continue colchicine  and prednisone  - Still significant pain, add indomethacin  on 11/11 - Needs to be resumed on allopurinol  outpatient  Hypokalemia - Replete as needed  Prolonged QT interval Optimize electrolytes Keep on telemetry Avoid qt prolonging drugs   Hypomagnesemia - Replete as needed  Abnormal LFTs (liver function tests) History of elevated LFTS (AST>ALT) appears to be alcohol related trend Admits to drinking 4x or more a week  Severe hepatic steatosis with hepatomegaly  Hepatitis panel negative Seen by general surgery with negative HIDA and no gallstones July 2025  Alcohol use Drinking at least 4x/week Vague about quantity No hx of withdrawals CIWA protocol Mv, thiamine  and folic acid    Benign essential hypertension Off her blood pressure medication for unknown amount of time  Start back norvasc  5mg  and lisinopril  20mg  daily   Dilated cbd, acquired -chronic, no change -negative HIDA per surgery note 02/2024   Chronic pain syndrome Continue subutex , gabapentin  and cymbalta PMP verified  Pancreatic insufficiency Continue  creon    Generalized anxiety disorder Continue cymbalta   History of heroin use - UDS only with opiates  Tobacco use Smoking 1PPD Nicotine  patch Encouraged cessation    Antimicrobials:   DVT prophylaxis:  enoxaparin  (LOVENOX ) injection 40 mg Start: 06/30/24  2200   Code Status:   Code Status: Full Code  Mobility Assessment (Last 72 Hours)     Mobility Assessment     Row Name 07/03/24 2100 07/03/24 1100 07/02/24 2122 07/02/24 1616 07/01/24 2100   Does the patient have exclusion criteria? No - Perform mobility assessment No - Perform mobility assessment No - Perform mobility assessment No - Perform mobility assessment No - Perform mobility assessment   What is the highest level of mobility based on the mobility assessment? Level 4 (Ambulates with assistance) - Balance while stepping forward/back - Complete Level 4 (Ambulates with assistance) - Balance while stepping forward/back - Complete Level 4 (Ambulates with assistance) - Balance while stepping forward/back - Complete Level 4 (Ambulates with assistance) - Balance while stepping forward/back - Complete Level 4 (Ambulates with assistance) - Balance while stepping forward/back - Complete    Row Name 07/01/24 1015           Does the patient have exclusion criteria? No - Perform mobility assessment       What is the highest level of mobility based on the mobility assessment? Level 4 (Ambulates with assistance) - Balance while stepping forward/back - Complete          Diet: Diet Orders (From admission, onward)     Start     Ordered   07/01/24 1818  Diet regular Room service appropriate? Yes; Fluid consistency: Thin  Diet effective now       Question Answer Comment  Room service appropriate? Yes   Fluid consistency: Thin      07/01/24 1817            Barriers to discharge: none Disposition Plan:  Home  HH orders placed: n/a Status is: Obs  Objective: Blood pressure (!) 146/88, pulse 75, temperature 98.4 F (36.9 C), resp. rate 18, height 5' 6 (1.676 m), weight 47.6 kg, SpO2 98%.  Examination:  Physical Exam Constitutional:      Appearance: Normal appearance.     Comments: Frail and uncomfortable appearing  HENT:     Head: Normocephalic and atraumatic.     Mouth/Throat:      Mouth: Mucous membranes are moist.  Eyes:     Extraocular Movements: Extraocular movements intact.  Cardiovascular:     Rate and Rhythm: Normal rate and regular rhythm.  Pulmonary:     Effort: Pulmonary effort is normal. No respiratory distress.     Breath sounds: Normal breath sounds. No wheezing.  Abdominal:     General: Bowel sounds are normal. There is no distension.     Palpations: Abdomen is soft.     Tenderness: There is no abdominal tenderness.  Musculoskeletal:     Cervical back: Normal range of motion and neck supple.     Comments: Right hand noted with significant swelling and signs of gouty tophi in 1st and 2nd digits  Skin:    General: Skin is warm and dry.  Neurological:     General: No focal deficit present.     Mental Status: She is alert.  Psychiatric:        Mood and Affect: Mood normal.   Pic taken 11/11:        Consultants:    Procedures:  Data Reviewed: Results for orders placed or performed during the hospital encounter of 06/30/24 (from the past 24 hours)  Basic metabolic panel with GFR     Status: Abnormal   Collection Time: 07/04/24  6:21 AM  Result Value Ref Range   Sodium 139 135 - 145 mmol/L   Potassium 4.4 3.5 - 5.1 mmol/L   Chloride 107 98 - 111 mmol/L   CO2 25 22 - 32 mmol/L   Glucose, Bld 102 (H) 70 - 99 mg/dL   BUN 15 6 - 20 mg/dL   Creatinine, Ser 9.33 0.44 - 1.00 mg/dL   Calcium 8.6 (L) 8.9 - 10.3 mg/dL   GFR, Estimated >39 >39 mL/min   Anion gap 7 5 - 15  CBC with Differential/Platelet     Status: Abnormal   Collection Time: 07/04/24  6:21 AM  Result Value Ref Range   WBC 5.1 4.0 - 10.5 K/uL   RBC 2.62 (L) 3.87 - 5.11 MIL/uL   Hemoglobin 9.4 (L) 12.0 - 15.0 g/dL   HCT 71.4 (L) 63.9 - 53.9 %   MCV 108.8 (H) 80.0 - 100.0 fL   MCH 35.9 (H) 26.0 - 34.0 pg   MCHC 33.0 30.0 - 36.0 g/dL   RDW 86.6 88.4 - 84.4 %   Platelets 214 150 - 400 K/uL   nRBC 0.0 0.0 - 0.2 %   Neutrophils Relative % 71 %   Neutro Abs 3.6 1.7 -  7.7 K/uL   Lymphocytes Relative 20 %   Lymphs Abs 1.0 0.7 - 4.0 K/uL   Monocytes Relative 7 %   Monocytes Absolute 0.4 0.1 - 1.0 K/uL   Eosinophils Relative 2 %   Eosinophils Absolute 0.1 0.0 - 0.5 K/uL   Basophils Relative 0 %   Basophils Absolute 0.0 0.0 - 0.1 K/uL   Immature Granulocytes 0 %   Abs Immature Granulocytes 0.01 0.00 - 0.07 K/uL  Magnesium      Status: None   Collection Time: 07/04/24  6:21 AM  Result Value Ref Range   Magnesium  1.8 1.7 - 2.4 mg/dL    I have reviewed pertinent nursing notes, vitals, labs, and images as necessary. I have ordered labwork to follow up on as indicated.  I have reviewed the last notes from staff over past 24 hours. I have discussed patient's care plan and test results with nursing staff, CM/SW, and other staff as appropriate.  Old records reviewed in assessment of this patient  Time spent: Greater than 50% of the 55 minute visit was spent in counseling/coordination of care for the patient as laid out in the A&P.   LOS: 2 days   Landon FORBES Baller, MD [facility] 07/04/2024, 9:12 AM

## 2024-07-04 NOTE — Plan of Care (Signed)
  Problem: Education: Goal: Knowledge of General Education information will improve Description: Including pain rating scale, medication(s)/side effects and non-pharmacologic comfort measures Outcome: Progressing   Problem: Health Behavior/Discharge Planning: Goal: Ability to manage health-related needs will improve Outcome: Progressing   Problem: Activity: Goal: Risk for activity intolerance will decrease Outcome: Progressing   Problem: Elimination: Goal: Will not experience complications related to urinary retention Outcome: Progressing   Problem: Pain Managment: Goal: General experience of comfort will improve and/or be controlled Outcome: Progressing   Problem: Safety: Goal: Ability to remain free from injury will improve Outcome: Progressing   Problem: Skin Integrity: Goal: Risk for impaired skin integrity will decrease Outcome: Progressing

## 2024-07-05 DIAGNOSIS — A0472 Enterocolitis due to Clostridium difficile, not specified as recurrent: Secondary | ICD-10-CM | POA: Diagnosis not present

## 2024-07-05 LAB — CBC WITH DIFFERENTIAL/PLATELET
Abs Immature Granulocytes: 0.02 K/uL (ref 0.00–0.07)
Basophils Absolute: 0 K/uL (ref 0.0–0.1)
Basophils Relative: 0 %
Eosinophils Absolute: 0.1 K/uL (ref 0.0–0.5)
Eosinophils Relative: 2 %
HCT: 29.9 % — ABNORMAL LOW (ref 36.0–46.0)
Hemoglobin: 9.8 g/dL — ABNORMAL LOW (ref 12.0–15.0)
Immature Granulocytes: 0 %
Lymphocytes Relative: 30 %
Lymphs Abs: 1.6 K/uL (ref 0.7–4.0)
MCH: 35.1 pg — ABNORMAL HIGH (ref 26.0–34.0)
MCHC: 32.8 g/dL (ref 30.0–36.0)
MCV: 107.2 fL — ABNORMAL HIGH (ref 80.0–100.0)
Monocytes Absolute: 0.5 K/uL (ref 0.1–1.0)
Monocytes Relative: 9 %
Neutro Abs: 3.2 K/uL (ref 1.7–7.7)
Neutrophils Relative %: 59 %
Platelets: 237 K/uL (ref 150–400)
RBC: 2.79 MIL/uL — ABNORMAL LOW (ref 3.87–5.11)
RDW: 13.4 % (ref 11.5–15.5)
WBC: 5.4 K/uL (ref 4.0–10.5)
nRBC: 0 % (ref 0.0–0.2)

## 2024-07-05 LAB — BASIC METABOLIC PANEL WITH GFR
Anion gap: 9 (ref 5–15)
BUN: 21 mg/dL — ABNORMAL HIGH (ref 6–20)
CO2: 23 mmol/L (ref 22–32)
Calcium: 8.9 mg/dL (ref 8.9–10.3)
Chloride: 107 mmol/L (ref 98–111)
Creatinine, Ser: 0.82 mg/dL (ref 0.44–1.00)
GFR, Estimated: >60 mL/min (ref >60)
Glucose, Bld: 85 mg/dL (ref 70–99)
Potassium: 3.9 mmol/L (ref 3.5–5.1)
Sodium: 139 mmol/L (ref 135–145)

## 2024-07-05 LAB — CULTURE, BLOOD (ROUTINE X 2)
Culture: NO GROWTH
Culture: NO GROWTH
Special Requests: ADEQUATE
Special Requests: ADEQUATE

## 2024-07-05 LAB — MAGNESIUM: Magnesium: 1.7 mg/dL (ref 1.7–2.4)

## 2024-07-05 NOTE — Plan of Care (Signed)
  Problem: Education: Goal: Knowledge of General Education information will improve Description: Including pain rating scale, medication(s)/side effects and non-pharmacologic comfort measures Outcome: Progressing   Problem: Clinical Measurements: Goal: Diagnostic test results will improve Outcome: Progressing   Problem: Activity: Goal: Risk for activity intolerance will decrease Outcome: Progressing   Problem: Elimination: Goal: Will not experience complications related to bowel motility Outcome: Progressing   Problem: Pain Managment: Goal: General experience of comfort will improve and/or be controlled Outcome: Progressing   Problem: Skin Integrity: Goal: Risk for impaired skin integrity will decrease Outcome: Progressing

## 2024-07-05 NOTE — Progress Notes (Signed)
 Progress Note    [name]   FMW:[id number]  DOB: July 15, 1971  DOA: 06/30/2024     3 [facility]: [facility]  Initial CC: nausea, right hand pain  Hospital Course: [name] is a 53 y.o. female with medical history significant of HTN, hx of polysubstance abuse in the past,  anxiety and depression, tophaceous gout, OSA, chronic pain syndrome on Subutex , pancreatic insufficiency with chronic nausea who presented to ED with complaints of right hand pain and diarrhea and vomiting.  She states her right hand fingers (thumb and pointer finger have swelling in the distal joints. She has known tophaceous gout and states the swelling and pain got worse over this past week and she couldn't take it anymore so came to hospital.  She also states she has chronic nausea and vomiting and diarrhea. She states this is about the same, but now she has been unable to eat or keep anything down. She is having about 6-7 watery stools/day, denies any blood and about 2-3 times of emesis daily. Having a hard time keeping down medication. Doesn't really endorse abdominal pain, but states has some mild pain in her epigastric area. Denies any fever or chills.  She typically only eats 1 meal a day and takes Creon  when doing so.  CT A/P was obtained on admission showing diffuse pancolitis concerning for infectious or inflammatory etiology.  Diffuse and severe hepatic steatosis. Right hand x-ray showed erosive changes of the index finger DIP with associated joint space loss and increased soft tissue swelling of the thumb and index finger.   Stool studies were ordered on admission. The patient tested positive for C Diff colitis. She has been started on vancomycin 125 mg PO q6 hours. On 07/05/2024 the patient agrees that she only had 3 BM's on 07/04/2024. By the time she was seen at mid-morning on 07/05/2024 the patient had had one BM. Will continue to monitor. Anticipate discharge to home in the next 24  hours.  She was also started on colchicine  and prednisone  for gout. This has improved.   Interval History:  The patient is resting comfortably. No new complaints. She states that her finger is feeling better. Stools are slowing down.  Assessment and Plan: * C. difficile colitis - Resolving nausea vomiting, diarrhea has improved - Questionable compliance on Creon  also - CT A/P showing diffuse pancolitis - C. difficile testing positive.  Vancomycin started 11/11 - ESR and CRP negative - Seen by GI in December 2024 with recommendations for colonoscopy at that time and has not yet followed back up - Still needs outpatient colonoscopy after recovery from current episode  Tophaceous gout - Has been off her allopurinol  for unknown amount of time with alcohol use - Uric acid 13.2---. Improved to 5.4 -Significant right hand gout appreciated with tophi -Continue colchicine  and prednisone  - Still significant pain, add indomethacin  on 11/11 - Needs to be resumed on allopurinol  outpatient  Hypokalemia - Resolved. - monitor.  Prolonged QT interval Optimize electrolytes Keep on telemetry Avoid qt prolonging drugs   Hypomagnesemia - Replete as needed  Abnormal LFTs (liver function tests) History of elevated LFTS (AST>ALT) appears to be alcohol related trend Admits to drinking 4x or more a week  Severe hepatic steatosis with hepatomegaly  Hepatitis panel negative Seen by general surgery with negative HIDA and no gallstones July 2025  Alcohol use Drinking at least 4x/week Vague about quantity No hx of withdrawals CIWA protocol MVI, thiamine  and folic acid    Benign  essential hypertension Off her blood pressure medication for unknown amount of time  Start back norvasc  5mg  and lisinopril  20mg  daily  Normotensive currently.  Dilated cbd, acquired -chronic, no change -negative HIDA per surgery note 02/2024   Chronic pain syndrome Continue subutex , gabapentin  and cymbalta PMP  verified  Pancreatic insufficiency Continue creon    Generalized anxiety disorder Continue cymbalta   History of heroin use - UDS only with opiates  Tobacco use Smoking 1PPD Nicotine  patch Encouraged cessation    Antimicrobials: Oral vancomycin for C Diff colitis.  DVT prophylaxis:  enoxaparin  (LOVENOX ) injection 40 mg Start: 06/30/24 2200   Code Status:   Code Status: Full Code  Mobility Assessment (Last 72 Hours)     Mobility Assessment     Row Name 07/05/24 0947 07/04/24 1937 07/04/24 0914 07/03/24 2100 07/03/24 1100   Does the patient have exclusion criteria? No - Perform mobility assessment No - Perform mobility assessment No - Perform mobility assessment No - Perform mobility assessment No - Perform mobility assessment   What is the highest level of mobility based on the mobility assessment? Level 4 (Ambulates with assistance) - Balance while stepping forward/back - Complete Level 4 (Ambulates with assistance) - Balance while stepping forward/back - Complete Level 4 (Ambulates with assistance) - Balance while stepping forward/back - Complete Level 4 (Ambulates with assistance) - Balance while stepping forward/back - Complete Level 4 (Ambulates with assistance) - Balance while stepping forward/back - Complete    Row Name 07/02/24 2122           Does the patient have exclusion criteria? No - Perform mobility assessment       What is the highest level of mobility based on the mobility assessment? Level 4 (Ambulates with assistance) - Balance while stepping forward/back - Complete          Diet: Diet Orders (From admission, onward)     Start     Ordered   07/01/24 1818  Diet regular Room service appropriate? Yes; Fluid consistency: Thin  Diet effective now       Question Answer Comment  Room service appropriate? Yes   Fluid consistency: Thin      07/01/24 1817            Barriers to discharge: none Disposition Plan:  Home  HH orders placed: n/a Status  is: Obs  Objective: Blood pressure (!) 148/87, pulse 67, temperature 98.9 F (37.2 C), resp. rate 16, height 5' 6 (1.676 m), weight 47.6 kg, SpO2 99%.  Exam:  Constitutional:  The patient is awake, alert, and oriented x 3. No acute distress. Eyes:  pupils and irises appear normal Normal lids and conjunctivae Respiratory:  No increased work of breathing. No wheezes, rales, or rhonchi No tactile fremitus Cardiovascular:  Regular rate and rhythm No murmurs, ectopy, or gallups. No lateral PMI. No thrills. Abdomen:  Abdomen is soft, non-tender, non-distended No hernias, masses, or organomegaly Normoactive bowel sounds.  Musculoskeletal:  No cyanosis, clubbing, or edema Improved appearance of digits. Skin:  No rashes, lesions, ulcers palpation of skin: no induration or nodules Neurologic:  CN 2-12 intact Sensation all 4 extremities intact Psychiatric:  Mental status Mood, affect appropriate Orientation to person, place, time  judgment and insight appear intact Pic taken 11/11:        Consultants:  None  Procedures:  None  Data Reviewed: Results for orders placed or performed during the hospital encounter of 06/30/24 (from the past 24 hours)  Basic metabolic panel with GFR  Status: Abnormal   Collection Time: 07/05/24  6:30 AM  Result Value Ref Range   Sodium 139 135 - 145 mmol/L   Potassium 3.9 3.5 - 5.1 mmol/L   Chloride 107 98 - 111 mmol/L   CO2 23 22 - 32 mmol/L   Glucose, Bld 85 70 - 99 mg/dL   BUN 21 (H) 6 - 20 mg/dL   Creatinine, Ser 9.17 0.44 - 1.00 mg/dL   Calcium 8.9 8.9 - 89.6 mg/dL   GFR, Estimated >39 >39 mL/min   Anion gap 9 5 - 15  CBC with Differential/Platelet     Status: Abnormal   Collection Time: 07/05/24  6:30 AM  Result Value Ref Range   WBC 5.4 4.0 - 10.5 K/uL   RBC 2.79 (L) 3.87 - 5.11 MIL/uL   Hemoglobin 9.8 (L) 12.0 - 15.0 g/dL   HCT 70.0 (L) 63.9 - 53.9 %   MCV 107.2 (H) 80.0 - 100.0 fL   MCH 35.1 (H) 26.0 - 34.0 pg    MCHC 32.8 30.0 - 36.0 g/dL   RDW 86.5 88.4 - 84.4 %   Platelets 237 150 - 400 K/uL   nRBC 0.0 0.0 - 0.2 %   Neutrophils Relative % 59 %   Neutro Abs 3.2 1.7 - 7.7 K/uL   Lymphocytes Relative 30 %   Lymphs Abs 1.6 0.7 - 4.0 K/uL   Monocytes Relative 9 %   Monocytes Absolute 0.5 0.1 - 1.0 K/uL   Eosinophils Relative 2 %   Eosinophils Absolute 0.1 0.0 - 0.5 K/uL   Basophils Relative 0 %   Basophils Absolute 0.0 0.0 - 0.1 K/uL   Immature Granulocytes 0 %   Abs Immature Granulocytes 0.02 0.00 - 0.07 K/uL  Magnesium      Status: None   Collection Time: 07/05/24  6:30 AM  Result Value Ref Range   Magnesium  1.7 1.7 - 2.4 mg/dL    I have reviewed pertinent nursing notes, vitals, labs, and images as necessary. I have ordered labwork to follow up on as indicated.  I have reviewed the last notes from staff over past 24 hours. I have discussed patient's care plan and test results with nursing staff, CM/SW, and other staff as appropriate.  Old records reviewed in assessment of this patient  Time spent: Greater than 50% of the 55 minute visit was spent in counseling/coordination of care for the patient as laid out in the A&P.   LOS: 3 days   Brigida Bureau, MD [facility] 07/05/2024, 5:25 PM

## 2024-07-05 NOTE — Plan of Care (Signed)

## 2024-07-05 NOTE — TOC Progression Note (Signed)
 Transition of Care [facility]) - Progression Note    Patient Details  Name: [name] MRN: [id number] Date of Birth: [birth year]  [facility] CM/SW Contact  Sonda Manuella Quill, RN Phone Number: 07/05/2024, 6:01 PM  Clinical Narrative:    Pt not ready for ready for d/c; IP CM is following.   Expected Discharge Plan: Home/Self Care Barriers to Discharge: Continued Medical Work up               Expected Discharge Plan and Services In-house Referral: Clinical Social Work Discharge Planning Services: NA Post Acute Care Choice: NA Living arrangements for the past 2 months: Single Family Home                 DME Arranged: N/A DME Agency: NA       HH Arranged: NA HH Agency: NA         Social Drivers of Health (SDOH) Interventions SDOH Screenings   Food Insecurity: Food Insecurity Present (06/30/2024)  Housing: High Risk (06/30/2024)  Transportation Needs: Unmet Transportation Needs (06/30/2024)  Utilities: Not At Risk (06/30/2024)  Alcohol Screen: Low Risk  (04/22/2021)  Depression (PHQ2-9): Low Risk  (01/04/2022)  Tobacco Use: High Risk (06/30/2024)    Readmission Risk Interventions    07/02/2024   11:42 AM 08/05/2023   10:22 AM  Readmission Risk Prevention Plan  Transportation Screening Complete Complete  PCP or Specialist Appt within 5-7 Days Complete Complete  Home Care Screening Complete Complete  Medication Review (RN CM) Complete Complete

## 2024-07-06 ENCOUNTER — Other Ambulatory Visit (HOSPITAL_COMMUNITY): Payer: Self-pay

## 2024-07-06 LAB — CBC WITH DIFFERENTIAL/PLATELET
Abs Immature Granulocytes: 0.05 K/uL (ref 0.00–0.07)
Basophils Absolute: 0 K/uL (ref 0.0–0.1)
Basophils Relative: 1 %
Eosinophils Absolute: 0.1 K/uL (ref 0.0–0.5)
Eosinophils Relative: 2 %
HCT: 32 % — ABNORMAL LOW (ref 36.0–46.0)
Hemoglobin: 10.8 g/dL — ABNORMAL LOW (ref 12.0–15.0)
Immature Granulocytes: 1 %
Lymphocytes Relative: 27 %
Lymphs Abs: 1.7 K/uL (ref 0.7–4.0)
MCH: 35.5 pg — ABNORMAL HIGH (ref 26.0–34.0)
MCHC: 33.8 g/dL (ref 30.0–36.0)
MCV: 105.3 fL — ABNORMAL HIGH (ref 80.0–100.0)
Monocytes Absolute: 0.5 K/uL (ref 0.1–1.0)
Monocytes Relative: 7 %
Neutro Abs: 4 K/uL (ref 1.7–7.7)
Neutrophils Relative %: 62 %
Platelets: 273 K/uL (ref 150–400)
RBC: 3.04 MIL/uL — ABNORMAL LOW (ref 3.87–5.11)
RDW: 13.8 % (ref 11.5–15.5)
WBC: 6.3 K/uL (ref 4.0–10.5)
nRBC: 0 % (ref 0.0–0.2)

## 2024-07-06 LAB — BASIC METABOLIC PANEL WITH GFR
Anion gap: 10 (ref 5–15)
BUN: 20 mg/dL (ref 6–20)
CO2: 25 mmol/L (ref 22–32)
Calcium: 8.9 mg/dL (ref 8.9–10.3)
Chloride: 105 mmol/L (ref 98–111)
Creatinine, Ser: 0.65 mg/dL (ref 0.44–1.00)
GFR, Estimated: >60 mL/min (ref >60)
Glucose, Bld: 119 mg/dL — ABNORMAL HIGH (ref 70–99)
Potassium: 4.1 mmol/L (ref 3.5–5.1)
Sodium: 140 mmol/L (ref 135–145)

## 2024-07-06 LAB — MAGNESIUM: Magnesium: 1.7 mg/dL (ref 1.7–2.4)

## 2024-07-06 MED ORDER — ASCORBIC ACID 500 MG PO TABS
500.0000 mg | ORAL_TABLET | Freq: Every day | ORAL | 0 refills | Status: AC
  Filled 2024-07-06: qty 100, 100d supply, fill #0

## 2024-07-06 MED ORDER — PANTOPRAZOLE SODIUM 40 MG PO TBEC
40.0000 mg | DELAYED_RELEASE_TABLET | Freq: Every day | ORAL | 0 refills | Status: AC
  Filled 2024-07-06: qty 30, 30d supply, fill #0

## 2024-07-06 MED ORDER — VITAMIN B-1 100 MG PO TABS
100.0000 mg | ORAL_TABLET | Freq: Every day | ORAL | 0 refills | Status: AC
Start: 2024-07-07 — End: ?
  Filled 2024-07-06: qty 100, 100d supply, fill #0

## 2024-07-06 MED ORDER — FOLIC ACID 1 MG PO TABS
1.0000 mg | ORAL_TABLET | Freq: Every day | ORAL | 0 refills | Status: AC
  Filled 2024-07-06: qty 30, 30d supply, fill #0

## 2024-07-06 MED ORDER — ACETAMINOPHEN 500 MG PO TABS
1000.0000 mg | ORAL_TABLET | Freq: Four times a day (QID) | ORAL | 2 refills | Status: AC
  Filled 2024-07-06: qty 100, 13d supply, fill #0
  Filled 2024-09-09 (×2): qty 100, 13d supply, fill #1

## 2024-07-06 MED ORDER — VANCOMYCIN HCL 125 MG PO CAPS
125.0000 mg | ORAL_CAPSULE | Freq: Four times a day (QID) | ORAL | 0 refills | Status: AC
  Filled 2024-07-06: qty 24, 6d supply, fill #0

## 2024-07-06 MED ORDER — CELECOXIB 100 MG PO CAPS
100.0000 mg | ORAL_CAPSULE | Freq: Two times a day (BID) | ORAL | 0 refills | Status: AC
  Filled 2024-07-06: qty 60, 30d supply, fill #0

## 2024-07-06 MED ORDER — COLCHICINE 0.6 MG PO TABS
0.6000 mg | ORAL_TABLET | Freq: Two times a day (BID) | ORAL | 0 refills | Status: AC
  Filled 2024-07-06: qty 20, 10d supply, fill #0

## 2024-07-06 MED ORDER — PREDNISONE 10 MG PO TABS
ORAL_TABLET | ORAL | 0 refills | Status: AC
  Filled 2024-07-06: qty 6, 8d supply, fill #0

## 2024-07-06 MED ORDER — ADULT MULTIVITAMIN W/MINERALS CH
1.0000 | ORAL_TABLET | Freq: Every day | ORAL | 0 refills | Status: AC
  Filled 2024-07-06: qty 30, 30d supply, fill #0

## 2024-07-06 MED ORDER — ALLOPURINOL 100 MG PO TABS
200.0000 mg | ORAL_TABLET | Freq: Every day | ORAL | 1 refills | Status: AC
Start: 2024-07-06 — End: ?
  Filled 2024-07-06: qty 180, 90d supply, fill #0
  Filled 2024-08-27: qty 180, 90d supply, fill #1

## 2024-07-06 MED ORDER — NICOTINE 21 MG/24HR TD PT24
21.0000 mg | MEDICATED_PATCH | Freq: Every evening | TRANSDERMAL | 0 refills | Status: AC
  Filled 2024-07-06: qty 28, 28d supply, fill #0

## 2024-07-06 MED ORDER — ENSURE PLUS HIGH PROTEIN PO LIQD
237.0000 mL | Freq: Three times a day (TID) | ORAL | 0 refills | Status: AC
  Filled 2024-07-06: qty 237, 1d supply, fill #0

## 2024-07-06 MED ORDER — BUPRENORPHINE HCL 2 MG SL SUBL
4.0000 mg | SUBLINGUAL_TABLET | Freq: Every day | SUBLINGUAL | 0 refills | Status: AC
  Filled 2024-07-06 – 2024-07-24 (×2): qty 60, 30d supply, fill #0

## 2024-07-06 MED ORDER — OXYCODONE HCL 5 MG PO TABS
5.0000 mg | ORAL_TABLET | Freq: Four times a day (QID) | ORAL | 0 refills | Status: AC | PRN
  Filled 2024-07-06: qty 20, 5d supply, fill #0

## 2024-07-06 NOTE — Progress Notes (Signed)
 Medication picked up from RX and returned to patient. White copy with patient's signature on the chart.

## 2024-07-06 NOTE — TOC Transition Note (Signed)
 Transition of Care [facility]) - Discharge Note   Patient Details  Name: [name] MRN: [id number] Date of Birth: 1971/06/09  Transition of Care [facility]) CM/SW Contact:  Heather DELENA Saltness, LCSW Phone Number: 07/06/2024, 2:23 PM   Clinical Narrative:    Pt discharging home today. Resources for SUD counseling, food insecurity, and transportation added to AVS. Pt requesting Taxi voucher. Pt is alert and oriented x3, not on oxygen, and ambulates independently at baseline. Estimated cost for Taxi from hospital to her home in [place], 4.7 miles is about $12. Pt reports ability to pay for taxi ride home. CSW left number for [facility] with RN. Pt to call to arrange taxi ride home. No further TOC needs at this time.   Final next level of care: Home/Self Care Barriers to Discharge: Barriers Resolved   Patient Goals and CMS Choice Patient states their goals for this hospitalization and ongoing recovery are:: To return home          Discharge Placement  Home              Patient to be transferred to facility by: Taxi Name of family member notified: Patient Patient and family notified of of transfer: 07/06/24  Discharge Plan and Services Additional resources added to the After Visit Summary for  SUD counseling In-house Referral: Clinical Social Work Discharge Planning Services: NA Post Acute Care Choice: NA          DME Arranged: N/A DME Agency: NA       HH Arranged: NA HH Agency: NA        Social Drivers of Health (SDOH) Interventions SDOH Screenings   Food Insecurity: Food Insecurity Present (06/30/2024)  Housing: High Risk (06/30/2024)  Transportation Needs: Unmet Transportation Needs (06/30/2024)  Utilities: Not At Risk (06/30/2024)  Alcohol Screen: Low Risk  (04/22/2021)  Depression (PHQ2-9): Low Risk  (01/04/2022)  Tobacco Use: High Risk (06/30/2024)     Readmission Risk Interventions    07/02/2024   11:42 AM 08/05/2023   10:22 AM  Readmission Risk Prevention  Plan  Transportation Screening Complete Complete  PCP or Specialist Appt within 5-7 Days Complete Complete  Home Care Screening Complete Complete  Medication Review (RN CM) Complete Complete    Signed: Heather Saltness, MSW, LCSW Clinical Social Worker Inpatient Care Management 07/06/2024 2:26 PM

## 2024-07-06 NOTE — Progress Notes (Signed)
 AVS reviewed with patient at bedside. All questions answered, and patient verbalized understanding. IV removed per order without complications. Patient to be discharged home. Medications delivered at bedside.

## 2024-07-08 ENCOUNTER — Other Ambulatory Visit: Payer: Self-pay

## 2024-07-11 NOTE — Discharge Summary (Addendum)
 Physician Discharge Summary   Patient: [name] MRN: [id number] DOB: [birth year]  Admit date:     06/30/2024  Discharge date: 07/06/2024  Discharge Physician: Brigida Bureau   PCP: [facility]   Recommendations at discharge:    Discharge to home Follow up with PCP in 7-10 days. Follow up with GI as outpatient once she completes course of oral vancomycin .  Discharge Diagnoses: Principal Problem:   C. difficile colitis Active Problems:   Tophaceous gout   Hypomagnesemia   Prolonged QT interval   Hypokalemia   Abnormal LFTs (liver function tests)   Alcohol use   Benign essential hypertension   Dilated cbd, acquired   Chronic pain syndrome   Pancreatic insufficiency   Generalized anxiety disorder   History of heroin use   Tobacco use  Resolved Problems:   * No resolved hospital problems. *  Hospital Course: [name] is a 53 y.o. female with medical history significant of HTN, hx of polysubstance abuse in the past,  anxiety and depression, tophaceous gout, OSA, chronic pain syndrome on Subutex , pancreatic insufficiency with chronic nausea who presented to ED with complaints of right hand pain and diarrhea and vomiting.  She states her right hand fingers (thumb and pointer finger have swelling in the distal joints. She has known tophaceous gout and states the swelling and pain got worse over this past week and she couldn't take it anymore so came to hospital.  She also states she has chronic nausea and vomiting and diarrhea. She states this is about the same, but now she has been unable to eat or keep anything down. She is having about 6-7 watery stools/day, denies any blood and about 2-3 times of emesis daily. Having a hard time keeping down medication. Doesn't really endorse abdominal pain, but states has some mild pain in her epigastric area. Denies any fever or chills.  She typically only eats 1 meal a day and takes Creon  when doing so.   CT A/P was  obtained on admission showing diffuse pancolitis concerning for infectious or inflammatory etiology.  Diffuse and severe hepatic steatosis. Right hand x-ray showed erosive changes of the index finger DIP with associated joint space loss and increased soft tissue swelling of the thumb and index finger.    Stool studies were ordered on admission. The patient tested positive for C Diff colitis. She has been started on vancomycin  125 mg PO q6 hours. On 07/05/2024 the patient agrees that she only had 3 BM's on 07/04/2024. By the time she was seen at mid-morning on 07/05/2024 the patient had had one BM. Will continue to monitor. Anticipate discharge to home in the next 24 hours.   She was also started on colchicine  and prednisone  for gout. This has improved. The erythema in her fingers has improved, although she continues to have significant tophi on these joints.   On the day of discharge the patient was having 3 or fewer bowel movements a day. She was tolerating her diet. Her lab results were stable.    She was discharged to home in fair condition.  Assessment and Plan: * C. difficile colitis - Ongoing nausea with chronic diarrhea and vomiting - Questionable compliance on Creon  also - CT A/P showing diffuse pancolitis - C. difficile testing positive.  Vancomycin  started 11/11 - ESR and CRP negative - Seen by GI in December 2024 with recommendations for colonoscopy at that time and has not yet followed back up - Still needs outpatient colonoscopy after  recovery from current episode. The patient needs to follow up with GI as outpatient - The patient will be discharged to home to continue PO Vancomycin  as directed.   Tophaceous gout - Has been off her allopurinol  for unknown amount of time with alcohol use - Uric acid 13.2 -Significant right hand gout appreciated with tophi -Continue colchicine  and prednisone  - Still significant pain, add indomethacin  on 11/11 -Erythema and heat are resolved,  but large tophi remain. - Resume allopurinol  as outpatient.  Hypokalemia - Replete as needed -Resolved  Prolonged QT interval Optimize electrolytes Keep on telemetry Avoid qt prolonging drugs  Hypomagnesemia - Resolved  Abnormal LFTs (liver function tests) History of elevated LFTS (AST>ALT) appears to be alcohol related trend Admits to drinking 4x or more a week  Severe hepatic steatosis with hepatomegaly  Hepatitis panel negative Seen by general surgery with negative HIDA and no gallstones July 2025  Alcohol use Drinking at least 4x/week Vague about quantity No hx of withdrawals CIWA protocol Mv, thiamine  and folic acid    Benign essential hypertension Off her blood pressure medication for unknown amount of time  Start back norvasc  5mg  and lisinopril  20mg  daily   Dilated cbd, acquired -chronic, no change -negative HIDA per surgery note 02/2024   Chronic pain syndrome Continue subutex , gabapentin  and cymbalta PMP verified  Pancreatic insufficiency Continue creon    Generalized anxiety disorder Continue cymbalta   History of heroin use - UDS only with opiates  Tobacco use Smoking 1PPD Nicotine  patch Encouraged cessation   Consultants: None Procedures performed: None  Disposition: Home Diet recommendation:  Discharge Diet Orders (From admission, onward)     Start     Ordered   07/06/24 0000  Diet - low sodium heart healthy        07/06/24 1148           Cardiac diet DISCHARGE MEDICATION: Allergies as of 07/06/2024       Reactions   Penicillins Hives        Medication List     STOP taking these medications    ferrous sulfate 325 (65 FE) MG EC tablet   loperamide  2 MG capsule Commonly known as: IMODIUM    Na Sulfate-K Sulfate-Mg Sulfate concentrate 17.5-3.13-1.6 GM/177ML Soln Commonly known as: Suprep Bowel Prep Kit   traMADol  50 MG tablet Commonly known as: ULTRAM        TAKE these medications    Acetaminophen  Extra Strength  500 MG Tabs Commonly known as: TYLENOL  Take 2 tablets (1,000 mg total) by mouth every 6 (six) hours.   allopurinol  100 MG tablet Commonly known as: ZYLOPRIM  Take 2 tablets (200 mg total) by mouth daily.   amLODipine  5 MG tablet Commonly known as: NORVASC  Take 1 tablet (5 mg total) by mouth daily.   ascorbic acid  500 MG tablet Commonly known as: VITAMIN C  Take 1 tablet (500 mg total) by mouth daily.   buprenorphine  2 MG Subl SL tablet Commonly known as: SUBUTEX  Place 2 tablets (4 mg total) under the tongue daily. What changed:  medication strength when to take this Another medication with the same name was removed. Continue taking this medication, and follow the directions you see here.   celecoxib  100 MG capsule Commonly known as: CELEBREX  Take 1 capsule (100 mg total) by mouth 2 (two) times daily. Take 1 tablet by mouth with food 1 or 2 times daily as needed for arthritis What changed:  how much to take how to take this when to take this  CertaVite/Antioxidants Tabs Take 1 tablet by mouth daily.   colchicine  0.6 MG tablet Take 1 tablet (0.6 mg total) by mouth 2 (two) times daily.   Creon  36000-114000 units Cpep capsule Generic drug: lipase/protease/amylase Take  2 capsules with a meal 1 capsule with a snack. What changed: Another medication with the same name was removed. Continue taking this medication, and follow the directions you see here.   feeding supplement (OSMOLITE 1.2 CAL) Liqd Take 237 mLs by mouth 3 (three) times daily between meals.   folic acid  1 MG tablet Commonly known as: FOLVITE  Take 1 tablet (1 mg total) by mouth daily.   gabapentin  100 MG capsule Commonly known as: NEURONTIN  Take 1 capsule (100 mg total) by mouth 2 (two) times daily.   lisinopril  20 MG tablet Commonly known as: ZESTRIL  Take 20 mg by mouth daily.   nicotine  21 mg/24hr patch Commonly known as: NICODERM CQ  - dosed in mg/24 hours Place 1 patch (21 mg total) onto the skin  every evening.   ondansetron  4 MG disintegrating tablet Commonly known as: ZOFRAN -ODT Dissolve 1 tablet (4 mg total) by mouth every 8 (eight) hours as needed for nausea or vomiting for up to 90 days   oxyCODONE  5 MG immediate release tablet Commonly known as: Roxicodone  Take 1 tablet (5 mg total) by mouth every 6 (six) hours as needed for up to 5 days.   pantoprazole  40 MG tablet Commonly known as: Protonix  Take 1 tablet (40 mg total) by mouth daily.   pravastatin  20 MG tablet Commonly known as: PRAVACHOL  Take 1 tablet (20 mg total) by mouth daily. What changed: Another medication with the same name was removed. Continue taking this medication, and follow the directions you see here.   predniSONE  10 MG tablet Commonly known as: DELTASONE  Take 1 tablet (10 mg total) by mouth daily for 4 days, THEN 0.5 tablets (5 mg total) daily for 4 days. Start taking on: July 09, 2024   thiamine  100 MG tablet Commonly known as: VITAMIN B1 Take 1 tablet (100 mg total) by mouth daily.   vancomycin  125 MG capsule Commonly known as: VANCOCIN  Take 1 capsule (125 mg total) by mouth 4 (four) times daily for 6 days.   Ventolin  HFA 108 (90 Base) MCG/ACT inhaler Generic drug: albuterol  Inhale 2 puffs into the lungs 2 (two) times daily. What changed:  when to take this reasons to take this        Discharge Exam: Filed Weights   06/30/24 1058 06/30/24 1119  Weight: 48.3 kg 47.6 kg   Exam:  Constitutional:  The patient is awake, alert, and oriented x 3. No acute distress. Eyes:  pupils and irises appear normal Normal lids and conjunctivae ENMT:  grossly normal hearing  Lips appear normal external ears, nose appear normal Oropharynx: mucosa, tongue,posterior pharynx appear normal Neck:  neck appears normal, no masses, normal ROM, supple no thyromegaly Respiratory:  No increased work of breathing. No wheezes, rales, or rhonchi No tactile fremitus Cardiovascular:  Regular rate  and rhythm No murmurs, ectopy, or gallups. No lateral PMI. No thrills. Abdomen:  Abdomen is soft, non-tender, non-distended No hernias, masses, or organomegaly Normoactive bowel sounds.  Musculoskeletal:  No cyanosis, clubbing, or edema Large tophi on thumb and index fingers of hand. No erythema or warmth. Skin:  No rashes, lesions, ulcers palpation of skin: no induration or nodules Neurologic:  CN 2-12 intact Sensation all 4 extremities intact Psychiatric:  Mental status Mood, affect appropriate Orientation to person, place,  time  judgment and insight appear intact   Condition at discharge: fair  The results of significant diagnostics from this hospitalization (including imaging, microbiology, ancillary and laboratory) are listed below for reference.   Imaging Studies: CT ABDOMEN PELVIS W CONTRAST Result Date: 06/30/2024 EXAM: CT ABDOMEN AND PELVIS WITH CONTRAST 06/30/2024 04:04:02 PM TECHNIQUE: CT of the abdomen and pelvis was performed with the administration of 80 mL Omnipaque  300 MG/ML solution. Multiplanar reformatted images are provided for review. Automated exposure control, iterative reconstruction, and/or weight-based adjustment of the mA/kV was utilized to reduce the radiation dose to as low as reasonably achievable. COMPARISON: Comparison study 07/10/2023. CLINICAL HISTORY: Abdominal pain, acute, nonlocalized. FINDINGS: LOWER CHEST: The lung bases are clear of an acute process. No pulmonary nodules or pleural effusions. No pericardial effusion. Aortic and coronary artery calcifications are noted. LIVER: Diffuse and severe fatty infiltration of the liver along with hepatomegaly. GALLBLADDER AND BILE DUCTS: The gallbladder is mildly distended. No intrahepatic biliary dilatation. There is moderate common bile duct dilatation (10 mm) but this is a chronic finding. SPLEEN: The spleen is normal in size. No splenic lesions. PANCREAS: No pancreatic mass, ductal dilatation, or  inflammation. ADRENAL GLANDS: The adrenal glands are normal. KIDNEYS, URETERS AND BLADDER: Stable malpositioned and duplicated collecting system of the right kidney. No renal lesions or hydronephrosis. No stones in the kidneys or ureters. No perinephric or periureteral stranding. The bladder is unremarkable. GI AND BOWEL: The stomach, duodenum, and small bowel are unremarkable. The terminal ileum is normal. No inflammatory changes or obstructive findings. There is a diffuse inflammatory or infectious process involving the colon with submucosal edema and mucosal and serosal enhancement of the entire colon. Findings could be due to C. difficile colitis. The appendix is surgically absent. PERITONEUM AND RETROPERITONEUM: No ascites. No free air. No mesenteric or retroperitoneal mass. VASCULATURE: Age advanced atherosclerotic calcification involving the aorta, iliac arteries, and branch vessels but no aneurysm or dissection. The branch vessels are patent. The major venous structures are patent. LYMPH NODES: Small scattered lymph nodes are stable. No pelvic or inguinal adenopathy. REPRODUCTIVE ORGANS: The uterus is normal. No adnexal mass. BONES AND SOFT TISSUES: The bony thorax is intact. No acute osseous abnormality. No subcutaneous lesions. No focal soft tissue abnormality. IMPRESSION: 1. Diffuse pancolitis, likely infectious or inflammatory etiology such as C. difficile colitis. 2. Diffuse and severe hepatic steatosis with hepatomegaly. 3. Moderate common bile duct dilation measuring 10 mm, chronic and unchanged. Electronically signed by: Maude Stammer MD 06/30/2024 04:28 PM EST RP Workstation: [id number]   DG Hand Complete Right Result Date: 06/30/2024 EXAM: 3 or more VIEW(S) XRAY OF THE HAND 06/30/2024 12:19:00 PM COMPARISON: 05/24/2023, 02/01/2024 CLINICAL HISTORY: Swelling and erythema to the DIP joint thumb and forefinger, gout, Crohn disease. FINDINGS: BONES AND JOINTS: Erosive changes of the index finger  DIP joint with associated joint space loss. No acute fracture. No joint dislocation. SOFT TISSUES: Increased soft tissue swelling of the thumb and index finger compared to prior. IMPRESSION: 1. Erosive changes of the index finger DIP joint with associated joint space loss.given chronicity, favor inflammatory arthropathy or gout.infection cannot technically be excluded but it is felt less likely given chronicity. 2. Increased soft tissue swelling of the thumb and index finger compared to prior, likely related to the clinical history of gout. Electronically signed by: Rockey Kilts MD 06/30/2024 12:53 PM EST RP Workstation: [id number]    Microbiology: Results for orders placed or performed during the hospital encounter of 06/30/24  Gastrointestinal Panel by  PCR , Stool     Status: None   Collection Time: 06/30/24 10:41 AM   Specimen: Stool  Result Value Ref Range Status   Campylobacter species NOT DETECTED NOT DETECTED Final   Plesimonas shigelloides NOT DETECTED NOT DETECTED Final   Salmonella species NOT DETECTED NOT DETECTED Final   Yersinia enterocolitica NOT DETECTED NOT DETECTED Final   Vibrio species NOT DETECTED NOT DETECTED Final   Vibrio cholerae NOT DETECTED NOT DETECTED Final   Enteroaggregative E coli (EAEC) NOT DETECTED NOT DETECTED Final   Enteropathogenic E coli (EPEC) NOT DETECTED NOT DETECTED Final   Enterotoxigenic E coli (ETEC) NOT DETECTED NOT DETECTED Final   Shiga like toxin producing E coli (STEC) NOT DETECTED NOT DETECTED Final   Shigella/Enteroinvasive E coli (EIEC) NOT DETECTED NOT DETECTED Final   Cryptosporidium NOT DETECTED NOT DETECTED Final   Cyclospora cayetanensis NOT DETECTED NOT DETECTED Final   Entamoeba histolytica NOT DETECTED NOT DETECTED Final   Giardia lamblia NOT DETECTED NOT DETECTED Final   Adenovirus F40/41 NOT DETECTED NOT DETECTED Final   Astrovirus NOT DETECTED NOT DETECTED Final   Norovirus GI/GII NOT DETECTED NOT DETECTED Final   Rotavirus A  NOT DETECTED NOT DETECTED Final   Sapovirus (I, II, IV, and V) NOT DETECTED NOT DETECTED Final    Comment: Performed at [facility] [address] [place], [place] [address]  C Difficile Quick Screen w PCR reflex     Status: Abnormal   Collection Time: 06/30/24 10:41 AM   Specimen: Stool  Result Value Ref Range Status   C Diff antigen POSITIVE (A) NEGATIVE Final   C Diff toxin NEGATIVE NEGATIVE Final   C Diff interpretation Results are indeterminate. See PCR results.  Final    Comment: Performed at [facility], [address] [place], [place] [address]  C. Diff by PCR, Reflexed     Status: Abnormal   Collection Time: 06/30/24 10:41 AM  Result Value Ref Range Status   Toxigenic C. Difficile by PCR POSITIVE (A) NEGATIVE Final    Comment: Positive for toxigenic C. difficile with little to no toxin production. Only treat if clinical presentation suggests symptomatic illness.   Hypervirulent Strain PRESUMPTIVE NEGATIVE PRESUMPTIVE NEGATIVE Final    Comment: Performed at [facility], [address], [place], [place] [address]  Culture, blood (Routine X 2) w Reflex to ID Panel     Status: None   Collection Time: 06/30/24  8:41 PM   Specimen: Left Antecubital; Blood  Result Value Ref Range Status   Specimen Description   Final    LEFT ANTECUBITAL BOTTLES DRAWN AEROBIC AND ANAEROBIC Performed at [facility], [address], [place], [place] [address]    Special Requests   Final    Blood Culture adequate volume Performed at [facility], [address] [place], [place] [address]    Culture   Final    NO GROWTH 5 DAYS Performed at [facility], [address] [place], [place] [address]    Report Status 07/05/2024 FINAL  Final  Culture, blood (Routine X 2) w Reflex to ID Panel     Status: None   Collection Time: 06/30/24  8:41 PM   Specimen: BLOOD RIGHT FOREARM  Result Value Ref Range Status   Specimen  Description   Final    BLOOD RIGHT FOREARM BOTTLES DRAWN AEROBIC AND ANAEROBIC Performed at [facility], [address] [place], [place]  [address]    Special Requests   Final    Blood Culture adequate volume Performed at [facility], [address] [place], [place] [address]    Culture   Final    NO GROWTH 5 DAYS Performed at [facility], [address] [place], [place] [address]    Report Status 07/05/2024 FINAL  Final    Labs: CBC: Recent Labs  Lab 07/05/24 0630 07/06/24 0536  WBC 5.4 6.3  NEUTROABS 3.2 4.0  HGB 9.8* 10.8*  HCT 29.9* 32.0*  MCV 107.2* 105.3*  PLT 237 273   Basic Metabolic Panel: Recent Labs  Lab 07/05/24 0630 07/06/24 0536  NA 139 140  K 3.9 4.1  CL 107 105  CO2 23 25  GLUCOSE 85 119*  BUN 21* 20  CREATININE 0.82 0.65  CALCIUM 8.9 8.9  MG 1.7 1.7   Liver Function Tests: No results for input(s): AST, ALT, ALKPHOS, BILITOT, PROT, ALBUMIN in the last 168 hours. CBG: No results for input(s): GLUCAP in the last 168 hours.  Discharge time spent: greater than 30 minutes.  Signed: Dickie Labarre, DO [facility] 07/11/2024  ADDENDUM: The patient was placed on buprenorphine  for treatment of her opiate addiction at discharge. The patient had previously been on subutex , but this was not available at this time.

## 2024-07-15 ENCOUNTER — Ambulatory Visit: Payer: MEDICAID | Attending: Internal Medicine | Admitting: Internal Medicine

## 2024-07-15 ENCOUNTER — Encounter: Payer: Self-pay | Admitting: Internal Medicine

## 2024-07-15 VITALS — BP 183/107 | HR 81 | Temp 97.5°F | Resp 17 | Ht 66.0 in | Wt 105.4 lb

## 2024-07-15 DIAGNOSIS — M1A9XX1 Chronic gout, unspecified, with tophus (tophi): Secondary | ICD-10-CM | POA: Diagnosis present

## 2024-07-15 DIAGNOSIS — M159 Polyosteoarthritis, unspecified: Secondary | ICD-10-CM | POA: Insufficient documentation

## 2024-07-15 DIAGNOSIS — M10472 Other secondary gout, left ankle and foot: Secondary | ICD-10-CM | POA: Diagnosis present

## 2024-07-16 LAB — COMPREHENSIVE METABOLIC PANEL WITH GFR
AG Ratio: 1.5 (calc) (ref 1.0–2.5)
ALT: 163 U/L — ABNORMAL HIGH (ref 6–29)
AST: 120 U/L — ABNORMAL HIGH (ref 10–35)
Albumin: 4.3 g/dL (ref 3.6–5.1)
Alkaline phosphatase (APISO): 127 U/L (ref 37–153)
BUN: 25 mg/dL (ref 7–25)
CO2: 24 mmol/L (ref 20–32)
Calcium: 9.5 mg/dL (ref 8.6–10.4)
Chloride: 103 mmol/L (ref 98–110)
Creat: 0.88 mg/dL (ref 0.50–1.03)
Globulin: 2.8 g/dL (ref 1.9–3.7)
Glucose, Bld: 97 mg/dL (ref 65–99)
Potassium: 4.3 mmol/L (ref 3.5–5.3)
Sodium: 138 mmol/L (ref 135–146)
Total Bilirubin: 0.3 mg/dL (ref 0.2–1.2)
Total Protein: 7.1 g/dL (ref 6.1–8.1)
eGFR: 78 mL/min/1.73m2 (ref >=60)

## 2024-07-16 LAB — URIC ACID: Uric Acid, Serum: 6.1 mg/dL (ref 2.5–7.0)

## 2024-07-22 ENCOUNTER — Other Ambulatory Visit: Payer: Self-pay

## 2024-07-24 ENCOUNTER — Encounter (HOSPITAL_COMMUNITY): Payer: Self-pay

## 2024-07-24 ENCOUNTER — Other Ambulatory Visit (HOSPITAL_COMMUNITY): Payer: Self-pay

## 2024-07-24 ENCOUNTER — Other Ambulatory Visit (HOSPITAL_BASED_OUTPATIENT_CLINIC_OR_DEPARTMENT_OTHER): Payer: Self-pay

## 2024-07-24 ENCOUNTER — Other Ambulatory Visit: Payer: Self-pay

## 2024-07-24 MED ORDER — BUPRENORPHINE HCL 8 MG SL SUBL
4.0000 mg | SUBLINGUAL_TABLET | Freq: Three times a day (TID) | SUBLINGUAL | 0 refills | Status: DC | PRN
  Filled 2024-07-24: qty 45, 30d supply, fill #0

## 2024-07-24 MED ORDER — TRAMADOL HCL 50 MG PO TABS
50.0000 mg | ORAL_TABLET | Freq: Three times a day (TID) | ORAL | 0 refills | Status: DC | PRN
  Filled 2024-07-24: qty 180, 30d supply, fill #0

## 2024-07-25 ENCOUNTER — Other Ambulatory Visit: Payer: Self-pay

## 2024-08-23 ENCOUNTER — Encounter (HOSPITAL_COMMUNITY): Payer: Self-pay

## 2024-08-23 ENCOUNTER — Other Ambulatory Visit (HOSPITAL_COMMUNITY): Payer: Self-pay

## 2024-08-23 ENCOUNTER — Other Ambulatory Visit: Payer: Self-pay

## 2024-08-23 MED ORDER — BUPRENORPHINE HCL 8 MG SL SUBL
4.0000 mg | SUBLINGUAL_TABLET | Freq: Three times a day (TID) | SUBLINGUAL | 0 refills | Status: AC | PRN
  Filled 2024-08-23 (×2): qty 45, 30d supply, fill #0

## 2024-08-23 MED ORDER — TRAMADOL HCL 50 MG PO TABS
50.0000 mg | ORAL_TABLET | Freq: Three times a day (TID) | ORAL | 0 refills | Status: AC | PRN
  Filled 2024-08-23 (×2): qty 180, 30d supply, fill #0

## 2024-08-27 ENCOUNTER — Other Ambulatory Visit: Payer: Self-pay

## 2024-08-27 ENCOUNTER — Other Ambulatory Visit (HOSPITAL_COMMUNITY): Payer: Self-pay

## 2024-08-28 ENCOUNTER — Other Ambulatory Visit: Payer: Self-pay

## 2024-09-09 ENCOUNTER — Other Ambulatory Visit (HOSPITAL_COMMUNITY): Payer: Self-pay

## 2024-09-19 ENCOUNTER — Other Ambulatory Visit: Payer: Self-pay

## 2024-09-19 MED ORDER — ONDANSETRON 4 MG PO TBDP: 4.0000 mg | ORAL_TABLET | ORAL | 2 refills | Status: AC

## 2024-09-19 MED ORDER — ALENDRONATE SODIUM 70 MG PO TABS: 70.0000 mg | ORAL_TABLET | ORAL | 4 refills | Status: AC

## 2024-09-19 MED ORDER — AMLODIPINE BESYLATE 5 MG PO TABS: 5.0000 mg | ORAL_TABLET | Freq: Every day | ORAL | 2 refills | Status: AC

## 2024-09-20 ENCOUNTER — Other Ambulatory Visit: Payer: Self-pay

## 2024-09-20 ENCOUNTER — Other Ambulatory Visit (HOSPITAL_COMMUNITY): Payer: Self-pay

## 2024-09-20 MED ORDER — TRAMADOL HCL 50 MG PO TABS
ORAL_TABLET | ORAL | 0 refills | Status: AC
  Filled 2024-09-20 (×2): qty 180, 30d supply, fill #0

## 2024-09-20 MED ORDER — BUPRENORPHINE HCL 8 MG SL SUBL
SUBLINGUAL_TABLET | SUBLINGUAL | 0 refills | Status: AC
  Filled 2024-09-20 (×2): qty 45, 30d supply, fill #0

## 2024-10-16 ENCOUNTER — Ambulatory Visit: Payer: MEDICAID | Admitting: Internal Medicine
